# Patient Record
Sex: Male | Born: 1940
Health system: Southern US, Community
[De-identification: ages and names within clinical notes are randomized; demographics above are authoritative.]

## PROBLEM LIST (undated history)

## (undated) ENCOUNTER — Emergency Department (HOSPITAL_COMMUNITY)
Admission: RE | Discharge status: Against Medical Advice | Payer: Medicare Other | Source: Ambulatory Visit | Attending: Family Medicine | Admitting: Family Medicine

## (undated) DIAGNOSIS — R911 Solitary pulmonary nodule: Secondary | ICD-10-CM

## (undated) DIAGNOSIS — N401 Enlarged prostate with lower urinary tract symptoms: Secondary | ICD-10-CM

## (undated) DIAGNOSIS — E7800 Pure hypercholesterolemia, unspecified: Secondary | ICD-10-CM

## (undated) DIAGNOSIS — L910 Hypertrophic scar: Secondary | ICD-10-CM

## (undated) DIAGNOSIS — K219 Gastro-esophageal reflux disease without esophagitis: Secondary | ICD-10-CM

## (undated) DIAGNOSIS — M1990 Unspecified osteoarthritis, unspecified site: Secondary | ICD-10-CM

## (undated) DIAGNOSIS — N138 Other obstructive and reflux uropathy: Secondary | ICD-10-CM

## (undated) DIAGNOSIS — E079 Disorder of thyroid, unspecified: Secondary | ICD-10-CM

## (undated) DIAGNOSIS — I10 Essential (primary) hypertension: Secondary | ICD-10-CM

## (undated) DIAGNOSIS — N200 Calculus of kidney: Secondary | ICD-10-CM

## (undated) DIAGNOSIS — E119 Type 2 diabetes mellitus without complications: Secondary | ICD-10-CM

## (undated) DIAGNOSIS — K648 Other hemorrhoids: Secondary | ICD-10-CM

## (undated) DIAGNOSIS — G5600 Carpal tunnel syndrome, unspecified upper limb: Secondary | ICD-10-CM

## (undated) DIAGNOSIS — I82419 Acute embolism and thrombosis of unspecified femoral vein: Secondary | ICD-10-CM

## (undated) HISTORY — DX: Other obstructive and reflux uropathy: N40.1

## (undated) HISTORY — DX: Carpal tunnel syndrome, unspecified upper limb: G56.00

## (undated) HISTORY — PX: OTHER SURGICAL HISTORY: SHX169

## (undated) HISTORY — DX: Hypertrophic scar: L91.0

## (undated) HISTORY — DX: Gastro-esophageal reflux disease without esophagitis: K21.9

## (undated) HISTORY — DX: Solitary pulmonary nodule: R91.1

## (undated) HISTORY — DX: Unspecified osteoarthritis, unspecified site: M19.90

## (undated) HISTORY — DX: Other hemorrhoids: K64.8

## (undated) HISTORY — DX: Pure hypercholesterolemia, unspecified: E78.00

## (undated) HISTORY — DX: Acute embolism and thrombosis of unspecified femoral vein: I82.419

## (undated) HISTORY — DX: Calculus of kidney: N20.0

## (undated) HISTORY — DX: Benign prostatic hyperplasia with lower urinary tract symptoms: N13.8

## (undated) HISTORY — DX: Essential (primary) hypertension: I10

## (undated) HISTORY — DX: Type 2 diabetes mellitus without complications: E11.9

---

## 1997-03-13 HISTORY — PX: PROSTATECTOMY: SHX69

## 1997-08-11 ENCOUNTER — Inpatient Hospital Stay (HOSPITAL_COMMUNITY): Admission: RE | Admit: 1997-08-11 | Discharge: 1997-08-16 | Payer: Self-pay | Admitting: Urology

## 1997-09-22 ENCOUNTER — Emergency Department (HOSPITAL_COMMUNITY): Admission: EM | Admit: 1997-09-22 | Discharge: 1997-09-22 | Payer: Self-pay | Admitting: Emergency Medicine

## 1998-03-13 HISTORY — PX: COSMETIC SURGERY: SHX468

## 1999-03-30 ENCOUNTER — Encounter: Payer: Self-pay | Admitting: Urology

## 1999-03-30 ENCOUNTER — Encounter: Admission: RE | Admit: 1999-03-30 | Discharge: 1999-03-30 | Payer: Self-pay | Admitting: Urology

## 1999-06-17 ENCOUNTER — Emergency Department (HOSPITAL_COMMUNITY): Admission: EM | Admit: 1999-06-17 | Discharge: 1999-06-17 | Payer: Self-pay | Admitting: Emergency Medicine

## 1999-08-15 ENCOUNTER — Ambulatory Visit (HOSPITAL_COMMUNITY): Admission: RE | Admit: 1999-08-15 | Discharge: 1999-08-15 | Payer: Self-pay | Admitting: Pulmonary Disease

## 1999-08-15 ENCOUNTER — Encounter: Payer: Self-pay | Admitting: Pulmonary Disease

## 2003-11-13 ENCOUNTER — Ambulatory Visit (HOSPITAL_COMMUNITY): Admission: RE | Admit: 2003-11-13 | Discharge: 2003-11-13 | Payer: Self-pay | Admitting: Orthopedic Surgery

## 2003-11-13 ENCOUNTER — Ambulatory Visit (HOSPITAL_BASED_OUTPATIENT_CLINIC_OR_DEPARTMENT_OTHER): Admission: RE | Admit: 2003-11-13 | Discharge: 2003-11-13 | Payer: Self-pay | Admitting: Orthopedic Surgery

## 2004-06-09 ENCOUNTER — Ambulatory Visit: Payer: Self-pay | Admitting: Pulmonary Disease

## 2004-12-12 ENCOUNTER — Ambulatory Visit: Payer: Self-pay | Admitting: Pulmonary Disease

## 2005-05-08 ENCOUNTER — Ambulatory Visit (HOSPITAL_COMMUNITY): Admission: RE | Admit: 2005-05-08 | Discharge: 2005-05-08 | Payer: Self-pay | Admitting: Family Medicine

## 2005-07-10 ENCOUNTER — Ambulatory Visit: Payer: Self-pay | Admitting: Pulmonary Disease

## 2005-07-28 ENCOUNTER — Ambulatory Visit: Payer: Self-pay | Admitting: Pulmonary Disease

## 2005-10-02 ENCOUNTER — Ambulatory Visit: Payer: Self-pay | Admitting: Orthopedic Surgery

## 2005-11-07 ENCOUNTER — Ambulatory Visit: Payer: Self-pay | Admitting: Pulmonary Disease

## 2006-02-19 ENCOUNTER — Ambulatory Visit: Payer: Self-pay | Admitting: Pulmonary Disease

## 2006-05-02 ENCOUNTER — Ambulatory Visit: Payer: Self-pay | Admitting: Pulmonary Disease

## 2006-05-02 LAB — CONVERTED CEMR LAB
ALT: 28 units/L (ref 0–40)
AST: 31 units/L (ref 0–37)
Alkaline Phosphatase: 60 units/L (ref 39–117)
BUN: 16 mg/dL (ref 6–23)
CO2: 32 meq/L (ref 19–32)
GFR calc Af Amer: 66 mL/min
HDL: 37.5 mg/dL — ABNORMAL LOW (ref >39.0)
Hgb A1c MFr Bld: 7.4 % — ABNORMAL HIGH (ref 4.6–6.0)
Sodium: 141 meq/L (ref 135–145)
VLDL: 30 mg/dL (ref 0–40)

## 2006-07-31 ENCOUNTER — Ambulatory Visit: Payer: Self-pay | Admitting: Pulmonary Disease

## 2006-07-31 LAB — CONVERTED CEMR LAB
BUN: 11 mg/dL (ref 6–23)
Calcium: 9.6 mg/dL (ref 8.4–10.5)
Chloride: 104 meq/L (ref 96–112)
Creatinine, Ser: 1.7 mg/dL — ABNORMAL HIGH (ref 0.4–1.5)
GFR calc non Af Amer: 43 mL/min
Potassium: 4.5 meq/L (ref 3.5–5.1)
Sodium: 142 meq/L (ref 135–145)

## 2006-10-19 ENCOUNTER — Ambulatory Visit: Payer: Self-pay | Admitting: Gastroenterology

## 2006-11-02 ENCOUNTER — Ambulatory Visit: Payer: Self-pay | Admitting: Gastroenterology

## 2006-11-26 ENCOUNTER — Ambulatory Visit: Payer: Self-pay | Admitting: Gastroenterology

## 2006-11-26 LAB — CONVERTED CEMR LAB
Fecal Occult Blood: NEGATIVE
OCCULT 4: NEGATIVE

## 2006-11-28 ENCOUNTER — Ambulatory Visit: Payer: Self-pay | Admitting: Pulmonary Disease

## 2006-11-28 LAB — CONVERTED CEMR LAB
AST: 31 units/L (ref 0–37)
Albumin: 4.2 g/dL (ref 3.5–5.2)
Alkaline Phosphatase: 56 units/L (ref 39–117)
Basophils Absolute: 0 10*3/uL (ref 0.0–0.1)
Basophils Relative: 0.5 % (ref 0.0–1.0)
CO2: 33 meq/L — ABNORMAL HIGH (ref 19–32)
Calcium: 9.6 mg/dL (ref 8.4–10.5)
Cholesterol: 148 mg/dL (ref 0–200)
Eosinophils Relative: 3.1 % (ref 0.0–5.0)
HDL: 29.6 mg/dL — ABNORMAL LOW (ref >39.0)
Hemoglobin: 14.8 g/dL (ref 13.0–17.0)
Hgb A1c MFr Bld: 6.5 % — ABNORMAL HIGH (ref 4.6–6.0)
LDL Cholesterol: 78 mg/dL (ref 0–99)
PSA: 1.01 ng/mL (ref 0.10–4.00)
Platelets: 203 10*3/uL (ref 150–400)
Potassium: 4.4 meq/L (ref 3.5–5.1)
Total Bilirubin: 0.9 mg/dL (ref 0.3–1.2)
Total Protein: 7.7 g/dL (ref 6.0–8.3)
Triglycerides: 200 mg/dL — ABNORMAL HIGH (ref 0–149)

## 2007-01-20 ENCOUNTER — Emergency Department (HOSPITAL_COMMUNITY): Admission: EM | Admit: 2007-01-20 | Discharge: 2007-01-20 | Payer: Self-pay | Admitting: Emergency Medicine

## 2007-01-21 ENCOUNTER — Ambulatory Visit: Payer: Self-pay | Admitting: Pulmonary Disease

## 2007-01-24 DIAGNOSIS — N200 Calculus of kidney: Secondary | ICD-10-CM

## 2007-01-24 DIAGNOSIS — M1990 Unspecified osteoarthritis, unspecified site: Secondary | ICD-10-CM

## 2007-01-24 DIAGNOSIS — G5600 Carpal tunnel syndrome, unspecified upper limb: Secondary | ICD-10-CM

## 2007-01-24 DIAGNOSIS — E782 Mixed hyperlipidemia: Secondary | ICD-10-CM

## 2007-01-24 DIAGNOSIS — I10 Essential (primary) hypertension: Secondary | ICD-10-CM | POA: Insufficient documentation

## 2007-01-28 ENCOUNTER — Encounter: Payer: Self-pay | Admitting: Pulmonary Disease

## 2007-02-19 ENCOUNTER — Ambulatory Visit: Payer: Self-pay | Admitting: Pulmonary Disease

## 2007-02-22 ENCOUNTER — Ambulatory Visit (HOSPITAL_BASED_OUTPATIENT_CLINIC_OR_DEPARTMENT_OTHER): Admission: RE | Admit: 2007-02-22 | Discharge: 2007-02-22 | Payer: Self-pay | Admitting: Orthopedic Surgery

## 2007-02-26 ENCOUNTER — Ambulatory Visit (HOSPITAL_COMMUNITY): Admission: RE | Admit: 2007-02-26 | Discharge: 2007-02-26 | Payer: Self-pay | Admitting: Family Medicine

## 2007-03-02 ENCOUNTER — Ambulatory Visit (HOSPITAL_COMMUNITY): Admission: RE | Admit: 2007-03-02 | Discharge: 2007-03-02 | Payer: Self-pay | Admitting: Family Medicine

## 2007-03-15 ENCOUNTER — Telehealth: Payer: Self-pay | Admitting: Pulmonary Disease

## 2007-03-27 ENCOUNTER — Ambulatory Visit: Payer: Self-pay | Admitting: Pulmonary Disease

## 2007-03-27 DIAGNOSIS — N401 Enlarged prostate with lower urinary tract symptoms: Secondary | ICD-10-CM

## 2007-03-27 DIAGNOSIS — R911 Solitary pulmonary nodule: Secondary | ICD-10-CM

## 2007-03-27 DIAGNOSIS — N138 Other obstructive and reflux uropathy: Secondary | ICD-10-CM

## 2007-03-27 DIAGNOSIS — L910 Hypertrophic scar: Secondary | ICD-10-CM

## 2007-03-27 DIAGNOSIS — K219 Gastro-esophageal reflux disease without esophagitis: Secondary | ICD-10-CM | POA: Insufficient documentation

## 2007-04-16 ENCOUNTER — Encounter: Payer: Self-pay | Admitting: Pulmonary Disease

## 2007-04-18 ENCOUNTER — Telehealth (INDEPENDENT_AMBULATORY_CARE_PROVIDER_SITE_OTHER): Payer: Self-pay | Admitting: *Deleted

## 2007-05-29 ENCOUNTER — Ambulatory Visit: Payer: Self-pay | Admitting: Pulmonary Disease

## 2007-05-29 DIAGNOSIS — K649 Unspecified hemorrhoids: Secondary | ICD-10-CM | POA: Insufficient documentation

## 2007-06-02 LAB — CONVERTED CEMR LAB
Alkaline Phosphatase: 66 units/L (ref 39–117)
BUN: 12 mg/dL (ref 6–23)
Bacteria, UA: NEGATIVE
Basophils Relative: 0.3 % (ref 0.0–1.0)
Bilirubin Urine: NEGATIVE
Creatinine, Ser: 1.2 mg/dL (ref 0.4–1.5)
Crystals: NEGATIVE
Direct LDL: 114.3 mg/dL
Eosinophils Absolute: 0.1 10*3/uL (ref 0.0–0.6)
HDL: 43.8 mg/dL (ref >39.0)
Hgb A1c MFr Bld: 7 % — ABNORMAL HIGH (ref 4.6–6.0)
Lymphocytes Relative: 19 % (ref 12.0–46.0)
MCV: 91.2 fL (ref 78.0–100.0)
Monocytes Absolute: 0.6 10*3/uL (ref 0.2–0.7)
Neutro Abs: 5.5 10*3/uL (ref 1.4–7.7)
Nitrite: NEGATIVE
Potassium: 3.8 meq/L (ref 3.5–5.1)
RBC / HPF: NONE SEEN
RDW: 13.8 % (ref 11.5–14.6)
Specific Gravity, Urine: 1.02 (ref 1.000–1.03)
TSH: 1.35 microintl units/mL (ref 0.35–5.50)
Total CHOL/HDL Ratio: 4.9
Triglycerides: 236 mg/dL (ref 0–149)
Uric Acid, Serum: 6.3 mg/dL (ref 2.4–7.0)
VLDL: 47 mg/dL — ABNORMAL HIGH (ref 0–40)
WBC, UA: NONE SEEN cells/hpf

## 2007-06-05 ENCOUNTER — Ambulatory Visit: Payer: Self-pay | Admitting: Pulmonary Disease

## 2007-07-08 ENCOUNTER — Encounter: Payer: Self-pay | Admitting: Pulmonary Disease

## 2007-09-26 ENCOUNTER — Ambulatory Visit: Payer: Self-pay | Admitting: Pulmonary Disease

## 2007-09-28 LAB — CONVERTED CEMR LAB
ALT: 37 units/L (ref 0–53)
Alkaline Phosphatase: 68 units/L (ref 39–117)
Basophils Absolute: 0 10*3/uL (ref 0.0–0.1)
Cholesterol: 129 mg/dL (ref 0–200)
Creatinine, Ser: 1.2 mg/dL (ref 0.4–1.5)
Eosinophils Absolute: 0.2 10*3/uL (ref 0.0–0.7)
GFR calc non Af Amer: 64 mL/min
Glucose, Bld: 104 mg/dL — ABNORMAL HIGH (ref 70–99)
HCT: 46.2 % (ref 39.0–52.0)
HDL: 34.6 mg/dL — ABNORMAL LOW (ref >39.0)
Hgb A1c MFr Bld: 6.8 % — ABNORMAL HIGH (ref 4.6–6.0)
LDL Cholesterol: 57 mg/dL (ref 0–99)
MCHC: 33.9 g/dL (ref 30.0–36.0)
Monocytes Relative: 6.3 % (ref 3.0–12.0)
Neutrophils Relative %: 66.5 % (ref 43.0–77.0)
Potassium: 4.3 meq/L (ref 3.5–5.1)
RDW: 12.7 % (ref 11.5–14.6)
Sodium: 140 meq/L (ref 135–145)
Triglycerides: 188 mg/dL — ABNORMAL HIGH (ref 0–149)
Uric Acid, Serum: 6.1 mg/dL (ref 4.0–7.8)
VLDL: 38 mg/dL (ref 0–40)
WBC: 6.8 10*3/uL (ref 4.5–10.5)

## 2007-10-06 ENCOUNTER — Emergency Department (HOSPITAL_COMMUNITY): Admission: EM | Admit: 2007-10-06 | Discharge: 2007-10-07 | Payer: Self-pay | Admitting: Emergency Medicine

## 2007-10-15 ENCOUNTER — Ambulatory Visit: Payer: Self-pay | Admitting: Cardiology

## 2007-10-28 ENCOUNTER — Ambulatory Visit (HOSPITAL_COMMUNITY): Admission: RE | Admit: 2007-10-28 | Discharge: 2007-10-28 | Payer: Self-pay | Admitting: Cardiology

## 2007-10-28 ENCOUNTER — Ambulatory Visit: Payer: Self-pay | Admitting: Cardiology

## 2007-12-26 ENCOUNTER — Ambulatory Visit (HOSPITAL_COMMUNITY): Admission: RE | Admit: 2007-12-26 | Discharge: 2007-12-26 | Payer: Self-pay | Admitting: Internal Medicine

## 2008-01-31 ENCOUNTER — Telehealth (INDEPENDENT_AMBULATORY_CARE_PROVIDER_SITE_OTHER): Payer: Self-pay | Admitting: *Deleted

## 2008-03-25 ENCOUNTER — Ambulatory Visit: Payer: Self-pay | Admitting: Pulmonary Disease

## 2008-04-03 LAB — CONVERTED CEMR LAB
AST: 39 units/L — ABNORMAL HIGH (ref 0–37)
Albumin: 4.4 g/dL (ref 3.5–5.2)
Alkaline Phosphatase: 66 units/L (ref 39–117)
BUN: 12 mg/dL (ref 6–23)
Bilirubin, Direct: 0.1 mg/dL (ref 0.0–0.3)
CO2: 31 meq/L (ref 19–32)
Chloride: 103 meq/L (ref 96–112)
Eosinophils Absolute: 0.1 10*3/uL (ref 0.0–0.7)
GFR calc non Af Amer: 64 mL/min
Glucose, Bld: 125 mg/dL — ABNORMAL HIGH (ref 70–99)
HCT: 45.9 % (ref 39.0–52.0)
Hgb A1c MFr Bld: 7 % — ABNORMAL HIGH (ref 4.6–6.0)
MCV: 92 fL (ref 78.0–100.0)
Monocytes Absolute: 0.5 10*3/uL (ref 0.1–1.0)
Potassium: 4.2 meq/L (ref 3.5–5.1)
Sodium: 142 meq/L (ref 135–145)
Total Bilirubin: 1 mg/dL (ref 0.3–1.2)
Total Protein: 7.6 g/dL (ref 6.0–8.3)
Uric Acid, Serum: 6.5 mg/dL (ref 4.0–7.8)
VLDL: 33 mg/dL (ref 0–40)
Vit D, 1,25-Dihydroxy: 22 — ABNORMAL LOW (ref 30–89)

## 2008-04-06 ENCOUNTER — Ambulatory Visit (HOSPITAL_COMMUNITY): Admission: RE | Admit: 2008-04-06 | Discharge: 2008-04-06 | Payer: Self-pay | Admitting: Family Medicine

## 2008-04-22 ENCOUNTER — Ambulatory Visit (HOSPITAL_COMMUNITY): Admission: RE | Admit: 2008-04-22 | Discharge: 2008-04-22 | Payer: Self-pay | Admitting: Urology

## 2008-06-09 ENCOUNTER — Encounter: Payer: Self-pay | Admitting: Pulmonary Disease

## 2008-12-02 ENCOUNTER — Ambulatory Visit: Payer: Self-pay | Admitting: Pulmonary Disease

## 2008-12-05 LAB — CONVERTED CEMR LAB
AST: 33 units/L (ref 0–37)
Albumin: 4.6 g/dL (ref 3.5–5.2)
Alkaline Phosphatase: 64 units/L (ref 39–117)
Creatinine, Ser: 1.3 mg/dL (ref 0.4–1.5)
Glucose, Bld: 102 mg/dL — ABNORMAL HIGH (ref 70–99)
HCT: 46.8 % (ref 39.0–52.0)
LDL Cholesterol: 77 mg/dL (ref 0–99)
Lymphs Abs: 1.8 10*3/uL (ref 0.7–4.0)
MCV: 92.2 fL (ref 78.0–100.0)
Monocytes Relative: 5.4 % (ref 3.0–12.0)
Platelets: 155 10*3/uL (ref 150.0–400.0)
TSH: 1.75 microintl units/mL (ref 0.35–5.50)
Triglycerides: 138 mg/dL (ref 0.0–149.0)
VLDL: 27.6 mg/dL (ref 0.0–40.0)
WBC: 8 10*3/uL (ref 4.5–10.5)

## 2009-01-21 ENCOUNTER — Encounter: Payer: Self-pay | Admitting: Pulmonary Disease

## 2009-05-31 ENCOUNTER — Ambulatory Visit: Payer: Self-pay | Admitting: Pulmonary Disease

## 2009-05-31 LAB — CONVERTED CEMR LAB
BUN: 10 mg/dL (ref 6–23)
CO2: 32 meq/L (ref 19–32)
Calcium: 9.5 mg/dL (ref 8.4–10.5)
Cholesterol: 127 mg/dL (ref 0–200)
GFR calc non Af Amer: 77.41 mL/min (ref >60)
Glucose, Bld: 110 mg/dL — ABNORMAL HIGH (ref 70–99)
Hgb A1c MFr Bld: 7 % — ABNORMAL HIGH (ref 4.6–6.5)
LDL Cholesterol: 64 mg/dL (ref 0–99)

## 2009-11-30 ENCOUNTER — Ambulatory Visit: Payer: Self-pay | Admitting: Pulmonary Disease

## 2009-12-01 ENCOUNTER — Ambulatory Visit: Payer: Self-pay | Admitting: Pulmonary Disease

## 2009-12-10 LAB — CONVERTED CEMR LAB
ALT: 34 units/L (ref 0–53)
Albumin: 4.1 g/dL (ref 3.5–5.2)
BUN: 11 mg/dL (ref 6–23)
Basophils Relative: 0.5 % (ref 0.0–3.0)
Chloride: 102 meq/L (ref 96–112)
Eosinophils Absolute: 0.1 10*3/uL (ref 0.0–0.7)
Eosinophils Relative: 2 % (ref 0.0–5.0)
HDL: 35.7 mg/dL — ABNORMAL LOW (ref >39.00)
Hgb A1c MFr Bld: 7.7 % — ABNORMAL HIGH (ref 4.6–6.5)
LDL Cholesterol: 77 mg/dL (ref 0–99)
Neutro Abs: 4.9 10*3/uL (ref 1.4–7.7)
Neutrophils Relative %: 72.6 % (ref 43.0–77.0)
PSA: 1.13 ng/mL (ref 0.10–4.00)
Platelets: 166 10*3/uL (ref 150.0–400.0)
RBC: 4.73 M/uL (ref 4.22–5.81)
RDW: 13.3 % (ref 11.5–14.6)
TSH: 1.3 microintl units/mL (ref 0.35–5.50)
Total Bilirubin: 0.6 mg/dL (ref 0.3–1.2)
Total CHOL/HDL Ratio: 4
Triglycerides: 151 mg/dL — ABNORMAL HIGH (ref 0.0–149.0)
VLDL: 30.2 mg/dL (ref 0.0–40.0)

## 2010-01-28 ENCOUNTER — Encounter: Payer: Self-pay | Admitting: Pulmonary Disease

## 2010-02-11 ENCOUNTER — Telehealth (INDEPENDENT_AMBULATORY_CARE_PROVIDER_SITE_OTHER): Payer: Self-pay | Admitting: *Deleted

## 2010-04-12 NOTE — Assessment & Plan Note (Signed)
Summary: physical ///kp   CC:  6 month ROV & review of mult medical problems....  History of Present Illness: 70 y/o BM here for a follow up visit... he has mult med prob as noted below...   ~  Jul09:  went to ER [facility]) w/ CP- felt to be atypical CP and referred to [facility] w/ eval 8/09 (note reviewed)... Myoview done 11/01/07 for this & risk factors- it showed no evid for infarct, +diaph attenuation, EF sl reduced at 47%...   ~  Jan10:  he reports that he has been doing well- trying to diet, and [facility]... unfortunately his weight is stable at 238# (hasn't been able to lose weight)... no new complaints or concerns today...  ~  Sep10:  [facility] continues to attempt [facility]- weight= 232#... he tells me he had some right groin pain & went to see DrRDavis for eval- he did MRI which the pt says was OK (we don't have notes from [facility])... he still takes Coumadin w/ Protimes by DrGolding in [place]... he went to [place] w/ bug bite on leg- received Tetanus shot & Flu shot...   ~  May 31, 2009:  generally stable- only c/o some right flank area muscle spasm w/ sudden twist or turn> we discussed heat, poss muscle relaxer, further eval if symptom persists... feeling well- BP controlled on meds;  Chol OK on Simva40;  DM stable on simple med, but still hasn't been able to lose weight... we discussed diet + exercise needs to lose weight...   ~  November 30, 2009:  he states he had a gout flair- occurred after stopping Allopurinol (?why) & placed on Probenecid by DrGolding- he wants to restart the Allopurinol gout preventive & I agree...  as before weight= the same & can't seem to lose any despite diet efforts> BP controlled on meds;  needs f/u fasting blood work to check Chol & A1c- he reports BS  ~110 range;  OK Flu shot today.   Current Problems:  PULMONARY NODULE (ICD-518.89) - vague nodular opacity right base over diaphragm on old films...  ~  1.7 cm  benign granuloma... no change on serial CXR or CT's back to 2000.  ~  CXR 2/10 is WNL- nodule not seen...  ~  CXR 9/10 showed tort Ao, Cor WNL, lungs clear w/o nodule identified.  ~  CXR 9/11 showed clear lungs, NAD...  HYPERTENSION (ICD-401.9) - now controlled on TOPROL 50mg Bid and NORVASC 10mg /d... BP's at home in the 120-130/70 range, and measures 126/82 today- he denies HA, fatigue, visual changes, CP, palipit, dizziness, syncope, dyspnea, edema, etc.  ~  he had a neg Cardiolite study in 10/04- no ischemia and EF=52%.  ~  Aug09: eval DrWall for atyp CP & risk factors--- Myoview 11/01/07 showed no evid for infarct, +diaph attenuation, EF sl reduced at 47%.  DEEP VENOUS THROMBOPHLEBITIS (ICD-453.40) - Dx'd by LMD in [place] in 3/07... [facility] was [facility] and had pain/ swelling L calf... ultrasound at [place] + for DVT & Rx COUMADIN by DrGolding (protimes q month)- no recent problems, he will check w/ DrGolding about discontinuing the Coumadin.  HYPERCHOLESTEROLEMIA (ICD-272.0) - controlled on SIMVASTATIN 40mg /d & FISH OIL...   ~  [place] 9/08 showed TChol 148, TG 200, HDL 30, LDL 78... on Advicor 500-20 at that time- keep same.  ~  [place] 3/09 showed TChol 214, TG 236, HDL 44, LDL 114... on  Advicor- rec change to Simvastatin40.  ~  [place] 7/09 showed TChol 129, Tg 188, HDL 35, LDL 57... rec- keep same.  ~  FLP 1/10 showed TChol 142, TG 165, HDL 35, LDL 74  ~  FLP 9/10 showed TChol 146, TG 138, HDL 42, LDL 77  ~  FLP 3/11 showed TChol 127, TG 147, HDL 34, LDL 64  ~  FLP 9/11 showed TChol 143, TG 151, HDL 36, LDL 77  DM (ICD-250.00) - on GLUCOVANCE 2.5-500 1/2tab @dinner  only now (due to low sugars on larger dosage).  ~  labs 9/08 showed FBS=106 and HgA1C=6.5.Marland KitchenMarland Kitchen on Glucovance 1/2 Bid...  ~  labs 3/09 showed BS= 99, HgA1c= 7.0.Marland KitchenMarland Kitchen on Glucovance 1/2 Qam only- rec to incr to Bid again.  ~  labs 7/09 showed BS= 104, HgA1c= 6.8.Marland KitchenMarland Kitchen rec- keep same.  ~  ** pt reports sugars too low   ~50 at 11AM if he takes even 1/2 tab in AM, therefore changed to 1/2 at dinner only...  ~  labs 1/10 showed BS= 125, A1c= 7.0  ~  labs 9/10 showed BS= 102, A1c= 6.8  ~  labs 3/11 showed BS= 110, A1c= 7.0.Marland KitchenMarland Kitchen continue same meds, get wt down!  ~  labs 9/11 showed BS= 129, A1c= 7.7.Marland KitchenMarland Kitchen rec change to METFORMIN ER 500mg  Qam & GLIMEPIRIDE 1mg  Qam.  GERD (ICD-530.81) - takes OMEPRAZOLE 20mg  Prn for reflux symptoms...  ~  neg colonoscopy by DrStark in 6/00, and again 8/08 ( x sm hem's)... f/u planned 10 yrs.  BENIGN PROSTATIC HYPERTROPHY, WITH OBSTRUCTION (ICD-600.01) - he sees DrDavis yearly- pt reports "PSA was good" (we don't have notes from him)... S/P open prostatectomy 1999 by [id number]... Hx prostatitis in past... uses Cialis for ED.  ~  labs 3/11 showed BUN= 10, Crear= 1.2... PSA done by Urology.  ~  labs 9/11 showed BUN= 11, Creat= 1.3, PSA= 1.13  RENAL CALCULUS (ICD-592.0)  DEGENERATIVE JOINT DISEASE (ICD-715.90) - hx of pain on his right side and right elbow... prev Rx w/ Etodolac, Parafon, heat and elbow pad... refer to ortho if symptoms persist... now he uses OTC anti-inflamm Rx Prn.  GOUT (ICD-274.9) - states he had episode right elbow arthritis and gout Rx'd by DrSypher 4/09 w/ Colchicine & Allopurinol 100mg /d...  ~  labs 7/09 show Uric 6.1.Marland KitchenMarland Kitchen OK on Allopurinol 100mg /d...  ~  labs 1/10 showed Uric= 6.5  ~  9/11:  pt reports that he stopped Allopurinol earlier this yr & had recent gout attack per DrGolding Rx w/ Probenecid... he would like to restart the Allopurinol preventive Rx & I agree> ALLOPURINOL 300mg /d written.  CARPAL TUNNEL SYNDROME (ICD-354.0) - treated by DrSypher over the years for CTS and mult trigger fingers (w/ surg)...  KELOID (ICD-701.4)  Health Maintenance - pt had "bug bite" on leg 9/10 w/ Tetanus shot by DrGolding in [place]... prev PNEUMOVAX 2000 at age 55, therefore given f/u PNEUMOVAX in 2000 at age 38... he gets yearly flu vaccine each fall.   Preventive  Screening-Counseling & Management  Alcohol-Tobacco     Smoking Status: never  Allergies: 1)  ! Ace Inhibitors  Comments:  Nurse/Medical Assistant: The patient's medications and allergies were reviewed with the patient and were updated in the Medication and Allergy Lists.  Past History:  Past Medical History: PULMONARY NODULE (ICD-518.89) HYPERTENSION (ICD-401.9) DEEP VENOUS THROMBOPHLEBITIS (ICD-453.40) HYPERCHOLESTEROLEMIA (ICD-272.0) DM (ICD-250.00) GERD (ICD-530.81) HEMORRHOIDS (ICD-455.6) BENIGN PROSTATIC HYPERTROPHY, WITH OBSTRUCTION (ICD-600.01) RENAL CALCULUS (ICD-592.0) DEGENERATIVE JOINT DISEASE (ICD-715.90) GOUT (ICD-274.9) CARPAL TUNNEL SYNDROME (ICD-354.0) KELOID (  ICD-701.4)  Past Surgical History: S/P prostatectomy by [id number] 1999 S/P plastic surgery in 2000 S/P ortho surg on fingers by DrSypher 2005 & 2008  Family History: Reviewed history from 12/02/2008 and no changes required. Father died age 34 from cancer of the mouth Mother alive, age 70, w/ DM, arthritis 4 Sibling: 1 Bro- in good health 3 Sis- one died w/ Parkinson's dis, one w/ HBP  Social History: Reviewed history from 12/02/2008 and no changes required. Married, 38yrs... 2 Children from prev marriage: 1 son murdered 69 1 daughter in [place] w/ [place] Never smoked Social alcohol Retired from [employer]       The patient complains of dyspnea on exertion, joint pain, stiffness, and arthritis.  The patient denies fever, chills, sweats, anorexia, fatigue, weakness, malaise, weight loss, sleep disorder, blurring, diplopia, eye irritation, eye discharge, vision loss, eye pain, photophobia, earache, ear discharge, tinnitus, decreased hearing, nasal congestion, nosebleeds, sore throat, hoarseness, chest pain, palpitations, syncope, orthopnea, PND, peripheral edema, cough, dyspnea at rest, excessive sputum, hemoptysis, wheezing, pleurisy, nausea, vomiting, diarrhea, constipation,  change in bowel habits, abdominal pain, melena, hematochezia, jaundice, gas/bloating, indigestion/heartburn, dysphagia, odynophagia, dysuria, hematuria, urinary frequency, urinary hesitancy, nocturia, incontinence, back pain, joint swelling, muscle cramps, muscle weakness, sciatica, restless legs, leg pain at night, leg pain with exertion, rash, itching, dryness, suspicious lesions, paralysis, paresthesias, seizures, tremors, vertigo, transient blindness, frequent falls, frequent headaches, difficulty walking, depression, anxiety, memory loss, confusion, cold intolerance, heat intolerance, polydipsia, polyphagia, polyuria, unusual weight change, abnormal bruising, bleeding, enlarged lymph nodes, urticaria, allergic rash, hay fever, and recurrent infections.    Vital Signs:  Patient profile:   70 year old male Height:      73 inches Weight:      233.50 pounds O2 Sat:      99 % on Room air Temp:     97.6 degrees F oral Pulse rate:   66 / minute BP sitting:   126 / 82  (left arm) Cuff size:   regular  Vitals Entered By: Elita Boone CMA (November 30, 2009 9:32 AM)  O2 Sat at Rest %:  99 O2 Flow:  Room air CC: 6 month ROV & review of mult medical problems... Is Patient Diabetic? Yes Pain Assessment Patient in pain? no      Comments meds updated today with pt   Physical Exam  Additional Exam:  WD, WN, overwt 69 BM in NAD...  GENERAL:  Alert & oriented; pleasant & cooperative... HEENT:  /AT, EOM-wnl, PERRLA, EACs-wax, TMs-wnl, NOSE-clear, THROAT-clear & wnl, no angioedema. NECK:  Supple w/ full ROM; no JVD; normal carotid impulses w/o bruits; no thyromegaly or nodules palpated; no lymphadenopathy. CHEST:  Clear to P & A; without wheezes/ rales/ or rhonchi. HEART:  Regular Rhythm; without murmurs/ rubs/ or gallops. ABDOMEN:  Soft & nontender; normal bowel sounds; no organomegaly or masses detected. RECTAL:  prostate 3+, no nodules, stool heme neg... EXT:  without deformities, mild  arthritic changes; no varicose veins/ venous insuffic/ or edema. NEURO:  CN's intact; motor testing normal; sensory testing normal; gait normal & balance OK. DERM:  Keloids noted...    CXR  Procedure date:  11/30/2009  Findings:      CHEST - 2 VIEW Comparison: 12/02/2008   Findings: Lungs clear.  Heart size and pulmonary vascularity normal.  No effusion.  Visualized bones unremarkable.   IMPRESSION: No acute disease   Read By:  Lucrezia Europe. Quillian Quince,  M.D.    MISC. Report  Procedure  date:  11/30/2009  Findings:      Lipid Panel (LIPID)   Cholesterol               143 mg/dL                   0-200   Triglycerides        [H]  151.0 mg/dL                 0.0-149.0   HDL                  [L]  35.70 mg/dL                 >39.00   LDL Cholesterol           77 mg/dL                    0-99   BMP (METABOL)   Sodium                    140 mEq/L                   135-145   Potassium                 4.3 mEq/L                   3.5-5.1   Chloride                  102 mEq/L                   96-112   Carbon Dioxide            30 mEq/L                    19-32   Glucose              [H]  129 mg/dL                   70-99   BUN                       11 mg/dL                    6-23   Creatinine                1.3 mg/dL                   0.4-1.5   Calcium                   9.5 mg/dL                   8.4-10.5   GFR                       73.07 mL/min                >60  Hepatic/Liver Function Panel (HEPATIC)   Total Bilirubin           0.6 mg/dL                   0.3-1.2   Direct Bilirubin          0.1 mg/dL  0.0-0.3   Alkaline Phosphatase      59 U/L                      39-117   AST                       37 U/L                      0-37   ALT                       34 U/L                      0-53   Total Protein             7.1 g/dL                    6.0-8.3   Albumin                   4.1 g/dL                    3.5-5.2  Comments:      CBC Platelet w/Diff  (CBCD)   White Cell Count          6.8 K/uL                    4.5-10.5   Red Cell Count            4.73 Mil/uL                 4.22-5.81   Hemoglobin                15.2 g/dL                   13.0-17.0   Hematocrit                43.6 %                      39.0-52.0   MCV                       92.2 fl                     78.0-100.0   Platelet Count            166.0 K/uL                  150.0-400.0   Neutrophil %              72.6 %                      43.0-77.0   Lymphocyte %              18.3 %                      12.0-46.0   Monocyte %                6.6 %                       3.0-12.0   Eosinophils%  2.0 %                       0.0-5.0   Basophils %               0.5 %                       0.0-3.0  TSH (TSH)   FastTSH                   1.30 uIU/mL                 0.35-5.50  Hemoglobin A1C (A1C)   Hemoglobin A1C       [H]  7.7 %                       4.6-6.5  Prostate Specific Antigen (PSA)   PSA-Hyb                   1.13 ng/mL                  0.10-4.00   Impression & Recommendations:  Problem # 1:  PULMONARY NODULE (ICD-518.89) F/u CXR is neg- prob granuloma, no signif issue here... Orders: T-2 View CXR ([id number])  Problem # 2:  HYPERTENSION (ICD-401.9) Controlled on BBlocker & CCB... continue same meds. His updated medication list for this problem includes:    Metoprolol Tartrate 50 Mg Tabs (Metoprolol tartrate) .Marland Kitchen... Take 1 tablet by mouth two times a day    Amlodipine Besylate 10 Mg Tabs (Amlodipine besylate) .Marland Kitchen... 1 tab daily...  Problem # 3:  DEEP VENOUS THROMBOPHLEBITIS (ICD-453.40) Still on Coumadin per DrGolding in [place]...  Problem # 4:  HYPERCHOLESTEROLEMIA (ICD-272.0) Stable on the Statin Rx>  tol well w/o muscle or liver problems... His updated medication list for this problem includes:    Simvastatin 40 Mg Tabs (Simvastatin) .Marland Kitchen... Take one by mouth at bedtime  Problem # 5:  DM (ICD-250.00) His A1c is worse- up to 7.7 & I suggest  that we change his meds>  change Glucovance to METFORMIN ER 500mg  Qam + GLIMEPIRIDE 1mg /d. The following medications were removed from the medication list:    Glyburide-metformin 2.5-500 Mg Tabs (Glyburide-metformin) .Marland Kitchen... Take 1/2 tab by mouth at dinnertime daily... His updated medication list for this problem includes:    Metformin Hcl 500 Mg Xr24h-tab (Metformin hcl) .Marland Kitchen... Take 1 tab by mouth once daily...    Glimepiride 1 Mg Tabs (Glimepiride) .Marland Kitchen... Take 1 tab by mouth once daily in the am...  Problem # 6:  GERD (ICD-530.81) Stable on PPI therapy>  continue same. His updated medication list for this problem includes:    Omeprazole 20 Mg Cpdr (Omeprazole) .Marland Kitchen... Take 1 tab by mouth once daily as needed for stomach acid...  Problem # 7:  BENIGN PROSTATIC HYPERTROPHY, WITH OBSTRUCTION (ICD-600.01) He will be assigned a new Urologist at [facility] since [place] left for [place].  Problem # 8:  GOUT (ICD-274.9) We discussed restarting Allopurinol Rx... His updated medication list for this problem includes:    Allopurinol 300 Mg Tabs (Allopurinol) .Marland Kitchen... Take 1 tab by mouth once daily...    Probenecid 500 Mg Tabs (Probenecid) .Marland Kitchen... As directed by drgolding...  Problem # 9:  OTHER MEDICAL PROBLEMS AS NOTED>>> OK Flu shot for 2011...  Complete Medication List: 1)  Warfarin Sodium 7.5 Mg Tabs (Warfarin sodium) .... Take as directed by drgolding.Marland KitchenMarland Kitchen 2)  Metoprolol Tartrate 50 Mg Tabs (Metoprolol tartrate) .... Take 1 tablet by mouth two times a day 3)  Amlodipine Besylate 10 Mg Tabs (Amlodipine besylate) .Marland Kitchen.. 1 tab daily.Marland KitchenMarland Kitchen 4)  Simvastatin 40 Mg Tabs (Simvastatin) .... Take one by mouth at bedtime 5)  Fish Oil 1000 Mg Caps (Omega-3 fatty acids) .... Take one capsule every day 6)  Omeprazole 20 Mg Cpdr (Omeprazole) .... Take 1 tab by mouth once daily as needed for stomach acid.Marland KitchenMarland Kitchen 7)  Allopurinol 300 Mg Tabs (Allopurinol) .... Take 1 tab by mouth once daily.Marland KitchenMarland Kitchen 8)  Probenecid 500 Mg Tabs  (Probenecid) .... As directed by drgolding... 9)  Cialis 20 Mg Tabs (Tadalafil) .... Take as directed for e.d.Marland KitchenMarland KitchenMarland Kitchen 10)  One-a-day Mens Tabs (Multiple vitamin) .... Take 1 tablet by mouth once a day 11)  Accu-chek Softclix Lancets Misc (Lancets) .... Use as directed 12)  Accu-chek Comfort Curve Strp (Glucose blood) .... Use as directed 13)  Metformin Hcl 500 Mg Xr24h-tab (Metformin hcl) .... Take 1 tab by mouth once daily... 14)  Glimepiride 1 Mg Tabs (Glimepiride) .... Take 1 tab by mouth once daily in the am...  Other Orders: Flu Vaccine 49yrs + MEDICARE PATIENTS [id number]) Administration Flu vaccine - MCR [id number])  Patient Instructions: 1)  Today we updated your med list- see below.... 2)  We decided to change your Gout med to ALLOPURINOL 300mg  daily as a preventive... 3)  Today we did your follow up CXR.Marland KitchenMarland Kitchen 4)  Please return to our lab in the AM for your FASTING blood work... 5)  Then please call the "phone tree" in a few days for your lab results.Marland KitchenMarland Kitchen 6)  We gave you the 2011 Flu vaccine today... 7)  Let's get on track w/ our diet + exercise program!!! 8)  Call for any questions.Marland KitchenMarland Kitchen 9)  Please schedule a follow-up appointment in 6 months. Prescriptions: GLIMEPIRIDE 1 MG TABS (GLIMEPIRIDE) take 1 tab by mouth once daily in the am...  #30 x 12   Entered and Authorized by:   Noralee Space MD   Signed by:   Noralee Space MD on 12/04/2009   Method used:   Print then Give to Patient   RxID:   [id number] METFORMIN HCL 500 MG XR24H-TAB (METFORMIN HCL) take 1 tab by mouth once daily...  #30 x 12   Entered and Authorized by:   Noralee Space MD   Signed by:   Noralee Space MD on 12/04/2009   Method used:   Print then Give to Patient   RxID:   [id number] ALLOPURINOL 300 MG TABS (ALLOPURINOL) take 1 tab by mouth once daily...  #30 x 12   Entered and Authorized by:   Noralee Space MD   Signed by:   Noralee Space MD on 11/30/2009   Method used:   Print then Give to Patient   RxID:    [id number] OMEPRAZOLE 20 MG CPDR (OMEPRAZOLE) take 1 tab by mouth once daily as needed for stomach acid...  #30 x 12   Entered and Authorized by:   Noralee Space MD   Signed by:   Noralee Space MD on 11/30/2009   Method used:   Print then Give to Patient   RxID:   [id number]    Immunization History:  Influenza Immunization History:    Influenza:  historical (01/27/2009) Flu Vaccine Consent Questions     Do you have a history of severe allergic reactions to this vaccine? no    Any prior  history of allergic reactions to egg and/or gelatin? no    Do you have a sensitivity to the preservative Thimersol? no    Do you have a past history of Guillan-Barre Syndrome? no    Do you currently have an acute febrile illness? no    Have you ever had a severe reaction to latex? no    Vaccine information given and explained to patient? yes    Are you currently pregnant? no    Lot Number:[id number]   Exp Date:09/10/2010   Site Given  Left Deltoid IMflu   Elita Boone CMA  November 30, 2009 10:37 AM

## 2010-04-12 NOTE — Assessment & Plan Note (Signed)
Summary: 6 months/apc   CC:  6 month ROV & review of mult medical problems....  History of Present Illness: 70 y/o BM here for a follow up visit... he has mult med prob as noted below...   ~  Jul09:  went to ER [facility]) w/ CP- felt to be atypical CP and referred to [facility] w/ eval 8/09 (note reviewed)... Myoview done 11/01/07 for this & risk factors- it showed no evid for infarct, +diaph attenuation, EF sl reduced at 47%...   ~  Jan10:  he reports that he has been doing well- trying to diet, and [facility]... unfortunately his weight is stable at 238# (hasn't been able to lose weight)... no new complaints or concerns today...  ~  Sep10:  he continues to attempt diet [facility]- weight= 232#... he tells me he had some right groin pain & went to see DrRDavis for eval- he did MRI which the pt says was OK (we don't have notes from [facility])... he still takes Coumadin w/ Protimes by DrGolding in [place]... he went to [place] w/ bug bite on leg- received Tetanus shot & Flu shot...   ~  May 31, 2009:  generally stable- only c/o some right flank area muscle spasm w/ sudden twist or turn> we discussed heat, poss muscle relaxer, further eval if symptom persists... feeling well- BP controlled on meds;  Chol OK on Simva40;  DM stable on simple med, but still hasn't been able to lose weight... we discussed diet + exercise needs to lose weight...    Current Problems:  PULMONARY NODULE (ICD-518.89) - vague nodular opacity right base over diaphragm on old films...  ~  1.7 cm benign granuloma... no change on serial CXR or CT's back to 2000.  ~  CXR 2/10 is WNL- nodule not seen...  ~  CXR 9/10 showed tort Ao, Cor WNL, lungs clear w/o nodule identified.  HYPERTENSION (ICD-401.9) - now controlled on TOPROL 50mg Bid and NORVASC 10mg /d... BP's at home in the 120-130/70 range, and measures 126/70 today- he denies HA, fatigue, visual changes, CP, palipit, dizziness,  syncope, dyspnea, edema, etc.  ~  he had a neg Cardiolite study in 10/04- no ischemia and EF=52%.  ~  Aug09: eval DrWall for atyp CP & risk factors--- Myoview 11/01/07 showed no evid for infarct, +diaph attenuation, EF sl reduced at 47%.  DEEP VENOUS THROMBOPHLEBITIS (ICD-453.40) - Dx'd by LMD in [place], [place] in 3/07... he was [facility]mill [facility] and had pain/ swelling L calf... ultrasound at [place] + for DVT & Rx COUMADIN by DrGolding (protimes q month)- no recent problems, he will check w/ DrGolding about discontinuing the Coumadin.  HYPERCHOLESTEROLEMIA (ICD-272.0) - controlled on SIMVASTATIN 40mg /d & FISH OIL...   ~  [place] 9/08 showed TChol 148, TG 200, HDL 30, LDL 78... on Advicor 500-20 at that time- keep same.  ~  [place] 3/09 showed TChol 214, TG 236, HDL 44, LDL 114... on Advicor- rec change to Simvastatin40.  ~  FLP 7/09 showed TChol 129, Tg 188, HDL 35, LDL 57... rec- keep same.  ~  FLP 1/10 showed TChol 142, TG 165, HDL 35, LDL 74  ~  FLP 9/10 showed TChol 146, TG 138, HDL 42, LDL 77  ~  FLP 3/11 showed TChol 127, TG 147, HDL 34, LDL 64  DM (ICD-250.00) - on GLUCOVANCE 2.5-500 1/2tab @dinner  only now (due to low sugars on larger dosage).  ~  labs  9/08 showed FBS=106 and HgA1C=6.5.Marland KitchenMarland Kitchen on Glucovance 1/2 Bid...  ~  labs 3/09 showed BS= 99, HgA1c= 7.0.Marland KitchenMarland Kitchen on Glucovance 1/2 Qam only- rec to incr to Bid again.  ~  labs 7/09 showed BS= 104, HgA1c= 6.8.Marland KitchenMarland Kitchen rec- keep same.  ~  ** pt reports sugars too low  ~50 at 11AM if he takes even 1/2 tab in AM, therefore changed to 1/2 at dinner only...  ~  labs 1/10 showed BS= 125, A1c= 7.0  ~  labs 9/10 showed BS= 102, A1c= 6.8  ~  labs 3/11 showed BS= 110, A1c= 7.0.Marland KitchenMarland Kitchen continue same meds, get wt down!  GERD (ICD-530.81) - takes OMEPRAZOLE 20mg  Prn for reflux symptoms...  ~  neg colonoscopy by DrStark in 6/00, and again 8/08 ( x sm hem's)... f/u planned 10 yrs.  BENIGN PROSTATIC HYPERTROPHY, WITH OBSTRUCTION (ICD-600.01) - he sees DrDavis  yearly- last Nov09 & pt reports "PSA was good"... S/P open prostatectomy 1999 by [id number]... Hx prostatitis in past... uses Cialis for ED.  ~  labs 3/11 showed BUN= 10, Crear= 1.2... PSA done by Urology.  RENAL CALCULUS (ICD-592.0)  DEGENERATIVE JOINT DISEASE (ICD-715.90) - today he is c/o pain on his right side and right elbow... we will try Rx w/ Etodolac, Parafon, heat and elbow pad... refer to ortho if symptoms persist...  GOUT (ICD-274.9) - states he had episode right elbow arthritis and gout Rx'd by DrSypher 4/09 w/ Colchicine & Allopurinol 100mg /d...  ~  labs 7/09 show Uric 6.1.Marland KitchenMarland Kitchen OK on Allopurinol 100mg /d...  ~  labs 1/10 showed Uric= 6.5  CARPAL TUNNEL SYNDROME (ICD-354.0) - treated by DrSypher over the years for CTS and mult trigger fingers...  KELOID (ICD-701.4)  Health Maintenance - pt had "bug bite" on leg 9/10 w/ Tetanus shot by DrGolding in [place]... he also had his FLu shot at that time... prev PNEUMOVAX 2000 at age 43, therefore due for f/u PNEUMOVAX now at age 9...   Allergies: 1)  ! Ace Inhibitors  Comments:  Nurse/Medical Assistant: The patient's medications and allergies were reviewed with the patient and were updated in the Medication and Allergy Lists.  Past History:  Past Medical History:  PULMONARY NODULE (ICD-518.89) HYPERTENSION (ICD-401.9) DEEP VENOUS THROMBOPHLEBITIS (ICD-453.40) HYPERCHOLESTEROLEMIA (ICD-272.0) DM (ICD-250.00) GERD (ICD-530.81) HEMORRHOIDS (ICD-455.6) BENIGN PROSTATIC HYPERTROPHY, WITH OBSTRUCTION (ICD-600.01) RENAL CALCULUS (ICD-592.0) DEGENERATIVE JOINT DISEASE (ICD-715.90) GOUT (ICD-274.9) CARPAL TUNNEL SYNDROME (ICD-354.0) KELOID (ICD-701.4)  Past Surgical History: S/P prostatectomy by [id number] 1999 S/P plastic surgery in 2000 S/P ortho surg on fingers by DrSypher 2005 & 2008  Family History: Reviewed history from 12/02/2008 and no changes required. Father died age 76 from cancer of the mouth Mother alive, age  53, w/ DM, arthritis 4 Sibling: 1 Bro- in good health 3 Sis- one died w/ Parkinson's dis, one w/ HBP.  Social History: Reviewed history from 12/02/2008 and no changes required. Married, 32yrs... 2 Children from prev marriage: 1 son murdered 2000 1 daughter in [place] w/ [place] Never smoked Social alcohol Retired from [employer]      See HPI  The patient denies anorexia, fever, weight loss, weight gain, vision loss, decreased hearing, hoarseness, chest pain, syncope, dyspnea on exertion, peripheral edema, prolonged cough, headaches, hemoptysis, abdominal pain, melena, hematochezia, severe indigestion/heartburn, hematuria, incontinence, muscle weakness, suspicious skin lesions, transient blindness, difficulty walking, depression, unusual weight change, abnormal bleeding, enlarged lymph nodes, and angioedema.    Vital Signs:  Patient profile:   70 year old male Height:      30  inches Weight:      235.25 pounds BMI:     31.15 O2 Sat:      99 % on Room air Temp:     97.6 degrees F oral Pulse rate:   70 / minute BP sitting:   126 / 70  (left arm) Cuff size:   regular  Vitals Entered By: Elita Boone CMA (May 31, 2009 10:29 AM)  O2 Sat at Rest %:  99 O2 Flow:  Room air CC: 6 month ROV & review of mult medical problems... Is Patient Diabetic? Yes Pain Assessment Patient in pain? yes      Onset of pain  RIGHT FLANK PAIN Comments MEDS UPDATED TODAY   Physical Exam  Additional Exam:  WD, WN, overwt 69 BM in NAD...  GENERAL:  Alert & oriented; pleasant & cooperative... HEENT:  [place]/AT, EOM-wnl, PERRLA, EACs-wax, TMs-wnl, NOSE-clear, THROAT-clear & wnl, no angioedema. NECK:  Supple w/ full ROM; no JVD; normal carotid impulses w/o bruits; no thyromegaly or nodules palpated; no lymphadenopathy. CHEST:  Clear to P & A; without wheezes/ rales/ or rhonchi. HEART:  Regular Rhythm; without murmurs/ rubs/ or gallops. ABDOMEN:  Soft & nontender; normal bowel sounds; no  organomegaly or masses detected. EXT:  without deformities, mild arthritic changes; no varicose veins/ venous insuffic/ or edema. NEURO:  CN's intact; motor testing normal; sensory testing normal; gait normal & balance OK. DERM:  Keloids noted...    MISC. Report  Procedure date:  05/31/2009  Findings:      Lipid Panel (LIPID)   Cholesterol               127 mg/dL                   0-200   Triglycerides             147.0 mg/dL                 0.0-149.0   HDL                  [L]  34.10 mg/dL                 >39.00   LDL Cholesterol           64 mg/dL                    0-99  BMP (METABOL)   Sodium                    143 mEq/L                   135-145   Potassium                 4.4 mEq/L                   3.5-5.1   Chloride                  107 mEq/L                   96-112   Carbon Dioxide            32 mEq/L                    19-32   Glucose              [H]  110 mg/dL  70-99   BUN                       10 mg/dL                    6-23   Creatinine                1.2 mg/dL                   0.4-1.5   Calcium                   9.5 mg/dL                   8.4-10.5   GFR                       77.41 mL/min                >60  Hemoglobin A1C (A1C)   Hemoglobin A1C       [H]  7.0 %                       4.6-6.5   Impression & Recommendations:  Problem # 1:  HYPERTENSION (ICD-401.9) BP controlled-  continue same meds. His updated medication list for this problem includes:    Metoprolol Tartrate 50 Mg Tabs (Metoprolol tartrate) .Marland Kitchen... Take 1 tablet by mouth two times a day    Amlodipine Besylate 10 Mg Tabs (Amlodipine besylate) .Marland Kitchen... 1 tab daily...  Orders: TLB-Lipid Panel (80061-LIPID) TLB-BMP (Basic Metabolic Panel-BMET) (99991111) TLB-A1C / Hgb A1C (Glycohemoglobin) (83036-A1C)  Problem # 2:  DEEP VENOUS THROMBOPHLEBITIS (ICD-453.40) Followed by DrGolding in [place] on Coumadin... he will inquire about Coumadin cessation...  Problem # 3:   HYPERCHOLESTEROLEMIA (ICD-272.0) FLP looks great on the simva40... His updated medication list for this problem includes:    Simvastatin 40 Mg Tabs (Simvastatin) .Marland Kitchen... Take one by mouth at bedtime  Problem # 4:  DM (ICD-250.00) A1c has increased to 7.0.Marland KitchenMarland Kitchen needs diet/ exercise/ get wt down or start new meds... he would like to avoid more medication- therefore must lose weight. His updated medication list for this problem includes:    Glyburide-metformin 2.5-500 Mg Tabs (Glyburide-metformin) .Marland Kitchen... Take 1/2 tab by mouth at dinnertime daily...  Problem # 5:  GERD (ICD-530.81) GI is stable... His updated medication list for this problem includes:    Omeprazole 20 Mg Cpdr (Omeprazole) .Marland Kitchen... Take 1 tab by mouth once daily as needed for stomach acid...  Problem # 6:  BENIGN PROSTATIC HYPERTROPHY, WITH OBSTRUCTION (ICD-600.01) Followed by [id number] for Urology...  Problem # 7:  OTHER MEDICAL PROBLEMS AS NOTED>>>  Complete Medication List: 1)  Warfarin Sodium 7.5 Mg Tabs (Warfarin sodium) .... Take as directed by drgolding... 2)  Metoprolol Tartrate 50 Mg Tabs (Metoprolol tartrate) .... Take 1 tablet by mouth two times a day 3)  Amlodipine Besylate 10 Mg Tabs (Amlodipine besylate) .Marland Kitchen.. 1 tab daily.Marland KitchenMarland Kitchen 4)  Simvastatin 40 Mg Tabs (Simvastatin) .... Take one by mouth at bedtime 5)  Fish Oil 1000 Mg Caps (Omega-3 fatty acids) .... Take one capsule every day 6)  Glyburide-metformin 2.5-500 Mg Tabs (Glyburide-metformin) .... Take 1/2 tab by mouth at dinnertime daily.Marland KitchenMarland Kitchen 7)  Omeprazole 20 Mg Cpdr (Omeprazole) .... Take 1 tab by mouth once daily as needed for stomach acid.Marland KitchenMarland Kitchen 8)  Allopurinol 100 Mg Tabs (Allopurinol) .... Take 1 tablet by  mouth once a day 9)  Colchicine 0.6 Mg Tabs (Colchicine) .... Take 1 tab by mouth once daily... 10)  Cialis 20 Mg Tabs (Tadalafil) .... Take as directed for e.d.Marland KitchenMarland KitchenMarland Kitchen 11)  One-a-day Mens Tabs (Multiple vitamin) .... Take 1 tablet by mouth once a day 12)  Accu-chek Softclix  Lancets Misc (Lancets) .... Use as directed 13)  Accu-chek Comfort Curve Strp (Glucose blood) .... Use as directed  Patient Instructions: 1)  Today we updated your med list- see below.... 2)  Continue your current meds the same... 3)  Today we did your follow up blood work... please call the "phone tree" in a few days for your lab results.Marland KitchenMarland Kitchen 4)  MrKing> let's get on track w/ our diet & exercise program... the goal is to lose 15-20 lbs... 5)  Call for any problems.Marland KitchenMarland Kitchen 6)  Please schedule a follow-up appointment in 6 months.

## 2010-04-12 NOTE — Progress Notes (Signed)
Summary: medication problems > change metformin to bid  Phone Note Call from Patient Call back at Home Phone [phone]   Caller: Patient Call For: nadel Summary of Call: Pt states his sugar has been running high since he's been taking metfromin 500mg  and glimepiride 1mg , the readings are 138-157, 161 and as high as 200, pls advise.//[facility] Initial call taken by: Netta Neat,  February 11, 2010 3:31 PM  Follow-up for Phone Call        Spoke with pt and he states over the last week and half his BS have been running higher then usual. He states he checks it every Am and it has been anywhere from 138-200. Pt states he feels great otherwise. Pt is on metformin 500mg  daily and glimeperide 1mg  daily. Pt states no changes in other meds and no changes in diet.  Please advise.McLennan Bing CMA  February 11, 2010 4:39 PM   Additional Follow-up for Phone Call Additional follow up Details #1::        per SN: rec change metformin ER to plain metformin 500mg  #60 or #180, 1 by mouth two times a day w/ breakfast and dinner.  keep the glimiperide the same in the mornings.  may increase the metformin ER to two times a day until he finishes that bottle.  called spoke with patient, advised of SN's recs as stated above.  pt verbazlied his understanding.  requests 90day supply.  rx sent to [facility] in [place]. Parke Poisson CNA/MA  February 11, 2010 5:08 PM     New/Updated Medications: METFORMIN HCL 500 MG TABS (METFORMIN HCL) Take 1 tablet by mouth two times a day Prescriptions: METFORMIN HCL 500 MG TABS (METFORMIN HCL) Take 1 tablet by mouth two times a day  #180 x 4   Entered by:   Parke Poisson CNA/MA   Authorized by:   Noralee Space MD   Signed by:   Parke Poisson CNA/MA on 02/11/2010   Method used:   Electronically to        [facility]. [phone] (retail)       [address]       [place], [place]  [address]       Ph: [phone] or [phone]   Fax: [phone]   [id number]

## 2010-04-12 NOTE — Letter (Signed)
Summary: [facility]   Imported By: Phillis Knack 02/10/2010 11:42:16  _____________________________________________________________________  External Attachment:    Type:   Image     Comment:   External Document

## 2010-05-13 ENCOUNTER — Other Ambulatory Visit: Payer: Self-pay | Admitting: Pulmonary Disease

## 2010-05-13 ENCOUNTER — Other Ambulatory Visit: Payer: Medicare Other

## 2010-05-13 ENCOUNTER — Ambulatory Visit (INDEPENDENT_AMBULATORY_CARE_PROVIDER_SITE_OTHER): Payer: Medicare Other | Admitting: Pulmonary Disease

## 2010-05-13 ENCOUNTER — Encounter: Payer: Self-pay | Admitting: Pulmonary Disease

## 2010-05-13 DIAGNOSIS — I10 Essential (primary) hypertension: Secondary | ICD-10-CM

## 2010-05-13 DIAGNOSIS — N401 Enlarged prostate with lower urinary tract symptoms: Secondary | ICD-10-CM

## 2010-05-13 DIAGNOSIS — E119 Type 2 diabetes mellitus without complications: Secondary | ICD-10-CM

## 2010-05-13 DIAGNOSIS — K219 Gastro-esophageal reflux disease without esophagitis: Secondary | ICD-10-CM

## 2010-05-13 DIAGNOSIS — M1990 Unspecified osteoarthritis, unspecified site: Secondary | ICD-10-CM

## 2010-05-13 DIAGNOSIS — E7800 Pure hypercholesterolemia, unspecified: Secondary | ICD-10-CM

## 2010-05-13 DIAGNOSIS — I82409 Acute embolism and thrombosis of unspecified deep veins of unspecified lower extremity: Secondary | ICD-10-CM

## 2010-05-13 DIAGNOSIS — M109 Gout, unspecified: Secondary | ICD-10-CM

## 2010-05-13 LAB — BASIC METABOLIC PANEL
BUN: 16 mg/dL (ref 6–23)
GFR: 86.26 mL/min (ref >60.00)
Glucose, Bld: 113 mg/dL — ABNORMAL HIGH (ref 70–99)
Potassium: 4.5 mEq/L (ref 3.5–5.1)
Sodium: 139 mEq/L (ref 135–145)

## 2010-05-13 LAB — LIPID PANEL
HDL: 42.9 mg/dL (ref >39.00)
LDL Cholesterol: 98 mg/dL (ref 0–99)
Total CHOL/HDL Ratio: 4
VLDL: 20.6 mg/dL (ref 0.0–40.0)

## 2010-05-14 ENCOUNTER — Encounter: Payer: Self-pay | Admitting: Pulmonary Disease

## 2010-05-19 NOTE — Assessment & Plan Note (Signed)
Summary: cpx   CC:  6 month ROV & review of mult medical problems....  History of Present Illness: 70 y/o BM here for a follow up visit... he has mult med prob as noted below...   ~  November 30, 2009:  he states he had a gout flair- occurred after stopping Allopurinol (?why) & placed on Probenecid by DrGolding- he wants to restart the Allopurinol gout preventive & I agree...  as before weight= the same & can't seem to lose any despite diet efforts> BP controlled on meds;  needs f/u fasting blood work to check Chol (FLP=ok) & A1c (BS129, A1c7.7)> he had decr his Glucovance on his own due to "low sugars" so we switched him to Metformin 500Bid & Glimep 1mg /d;  OK Flu shot today.   ~  May 13, 2010:  he hasn't had any gout or arthritic problems since getting back on the Allopurinol... he notes BP controlled on the Toprol/ Norvasc & he exercises on a treadmill for 15-20 min doing OK... he remains on coumadin followed by DrGolding in [place]... Lipids look good on diet + Simva40/ FishOil... but his DM control has not been adeq on current doses of Metform/ Glimep> BS at home runs up to 180 he says & labs today showed BS113, A1c=9.0.Marland KitchenMarland Kitchen we discussed diet & exercise & rec to double current meds> incr Metform500- 2Bid, and Glimep2mg Qam... his wt is unchanged at 232# & states his diet consists of "cutting back on sugar", & "not eating much bread"> we discussed this.   Current Problems:  PULMONARY NODULE (ICD-518.89) - vague nodular opacity right base over diaphragm on old films...  ~  1.7 cm benign granuloma... no change on serial CXR or CT's back to 2000.  ~  CXR 2/10 is WNL- nodule not seen...  ~  CXR 9/10 showed tort Ao, Cor WNL, lungs clear w/o nodule identified.  ~  CXR 9/11 showed clear lungs, NAD...  HYPERTENSION (ICD-401.9) - controlled on TOPROL 50mg Bid and NORVASC 10mg /d... BP's at home in the 120-130/70 range, and measures 120/80 today- he denies HA, fatigue, visual changes, CP, palipit,  dizziness, syncope, dyspnea, edema, etc.  ~  he had a neg Cardiolite study in 10/04- no ischemia and EF=52%.  ~  Aug09: eval DrWall for atyp CP & risk factors> Myoview 11/01/07 showed no evid for infarct, +diaph attenuation, EF sl reduced at 47%.  DEEP VENOUS THROMBOPHLEBITIS (ICD-453.40) - Dx'd by LMD in [place], [place] in 3/07... [facility] and [facility] pain/ swelling L calf... ultrasound at [facility] + for DVT & Rx COUMADIN by DrGolding (protimes q month)- no recent problems, he will check w/ DrGolding about discontinuing the Coumadin.  HYPERCHOLESTEROLEMIA (ICD-272.0) - controlled on SIMVASTATIN 40mg /d & FISH OIL...   ~  [place] 9/08 showed TChol 148, TG 200, HDL 30, LDL 78... on Advicor 500-20 at that time- keep same.  ~  [place] 3/09 showed TChol 214, TG 236, HDL 44, LDL 114... on Advicor- rec change to Simvastatin40.  ~  FLP 7/09 showed TChol 129, Tg 188, HDL 35, LDL 57... rec- keep same.  ~  FLP 1/10 showed TChol 142, TG 165, HDL 35, LDL 74... stable on simva40.  ~  FLP 9/10 showed TChol 146, TG 138, HDL 42, LDL 77  ~  FLP 3/11 showed TChol 127, TG 147, HDL 34, LDL 64  ~  FLP 9/11 showed TChol 143, TG 151, HDL 36, LDL 77  ~  FLP 3/12 showed TChol  161, TG 103, HDL 43, LDL 98  DM (ICD-250.00) - currently on METFORMIN 500mg Bid & GLIMEPIRIDE 1mg /d (going slowly due to prev hypoglycemia from glucovance).  ~  labs 9/08 showed FBS=106 and HgA1C=6.5.Marland KitchenMarland Kitchen on Glucovance 1/2 Bid.  ~  labs 3/09 showed BS= 99, HgA1c= 7.0.Marland KitchenMarland Kitchen on Glucovance 1/2 Qam only- rec to incr to Bid again.  ~  labs 7/09 showed BS= 104, HgA1c= 6.8.Marland KitchenMarland Kitchen rec- keep same.  ~  pt reports sugars too low  ~50 at 11AM if he takes even 1/2 tab in AM, therefore he changed to 1/2 at dinner only...  ~  labs 1/10 showed BS= 125, A1c= 7.0  ~  labs 9/10 showed BS= 102, A1c= 6.8  ~  labs 3/11 showed BS= 110, A1c= 7.0.Marland KitchenMarland Kitchen continue same meds, get wt down!  ~  labs 9/11 showed BS= 129, A1c= 7.7.Marland KitchenMarland Kitchen rec change to METFORMIN ER 500mg  Qam &  GLIMEPIRIDE 1mg  Qam.  ~  labs 3/12 showed BS= 113, A1c= 9.0.Marland KitchenMarland Kitchen rec to double meds> Metform500-2Bid, Glim2mg AM.  GERD (ICD-530.81) - takes OMEPRAZOLE 20mg  Prn for reflux symptoms...  ~  neg colonoscopy by DrStark in 6/00, and again 8/08 ( x sm hem's)... f/u planned 10 yrs.  BENIGN PROSTATIC HYPERTROPHY, WITH OBSTRUCTION (ICD-600.01) - he sees DrDavis yearly- pt reports "PSA was good" (we don't have notes from him)... S/P open prostatectomy 1999 by [id number]... Hx prostatitis in past... uses Cialis for ED.  ~  labs 3/11 showed BUN= 10, Crear= 1.2... PSA done by Urology.  ~  labs 9/11 showed BUN= 11, Creat= 1.3, PSA= 1.13  ~  he had f/u Urology DrBorden- BPH/ LUTS, PSA screening- "it was good" per pt.  RENAL CALCULUS (ICD-592.0)  DEGENERATIVE JOINT DISEASE (ICD-715.90) - hx of pain on his right side and right elbow... prev Rx w/ Etodolac, Parafon, heat and elbow pad... refer to ortho if symptoms persist... now he uses OTC anti-inflamm Rx Prn.  GOUT (ICD-274.9) - states he had episode right elbow arthritis and gout Rx'd by DrSypher 4/09 w/ Colchicine & Allopurinol 100mg /d...  ~  labs 7/09 show Uric 6.1.Marland KitchenMarland Kitchen OK on Allopurinol 100mg /d...  ~  labs 1/10 showed Uric= 6.5  ~  9/11:  pt reports that he stopped Allopurinol earlier this yr & had recent gout attack per DrGolding Rx w/ Probenecid... he would like to restart the Allopurinol preventive Rx & I agree> ALLOPURINOL 300mg /d written.  CARPAL TUNNEL SYNDROME (ICD-354.0) - treated by DrSypher over the years for CTS and mult trigger fingers (w/ surg)...  KELOID (ICD-701.4)  Health Maintenance - pt had "bug bite" on leg 9/10 w/ Tetanus shot by DrGolding in [place]... prev PNEUMOVAX 2000 at age 58, therefore given f/u PNEUMOVAX in 2000 at age 22... he gets yearly flu vaccine each fall.   Preventive Screening-Counseling & Management  Alcohol-Tobacco     Smoking Status: never  Allergies: 1)  ! Ace Inhibitors  Comments:  Nurse/Medical  Assistant: The patient's medications and allergies were reviewed with the patient and were updated in the Medication and Allergy Lists.  Past History:  Past Medical History: PULMONARY NODULE (ICD-518.89) HYPERTENSION (ICD-401.9) DEEP VENOUS THROMBOPHLEBITIS (ICD-453.40) HYPERCHOLESTEROLEMIA (ICD-272.0) DM (ICD-250.00) GERD (ICD-530.81) HEMORRHOIDS (ICD-455.6) BENIGN PROSTATIC HYPERTROPHY, WITH OBSTRUCTION (ICD-600.01) RENAL CALCULUS (ICD-592.0) DEGENERATIVE JOINT DISEASE (ICD-715.90) GOUT (ICD-274.9) CARPAL TUNNEL SYNDROME (ICD-354.0) KELOID (ICD-701.4)  Past Surgical History: S/P prostatectomy by [id number] 1999 S/P plastic surgery in 2000 S/P ortho surg on fingers by DrSypher 2005 & 2008  Family History: Reviewed history from 11/30/2009 and no changes required. Father  died age 63 from cancer of the mouth Mother alive, age 36, w/ DM, arthritis 4 Sibling: 1 Bro- in good health 3 Sis- one died w/ Parkinson's dis, one w/ HBP  Social History: Reviewed history from 11/30/2009 and no changes required. Married, 19yrs... 2 Children from prev marriage: 1 son murdered 2000 1 daughter in [place] w/ [place] Never smoked Social alcohol Retired from [employer]      See HPI       The patient complains of dyspnea on exertion.  The patient denies anorexia, fever, weight loss, weight gain, vision loss, decreased hearing, hoarseness, chest pain, syncope, peripheral edema, prolonged cough, headaches, hemoptysis, abdominal pain, melena, hematochezia, severe indigestion/heartburn, hematuria, incontinence, muscle weakness, suspicious skin lesions, transient blindness, difficulty walking, depression, unusual weight change, abnormal bleeding, enlarged lymph nodes, and angioedema.    Vital Signs:  Patient profile:   70 year old male Height:      73 inches Weight:      232.25 pounds BMI:     30.75 O2 Sat:      98 % on Room air Temp:     98.5 degrees F oral Pulse rate:   58 /  minute BP sitting:   120 / 80  (left arm) Cuff size:   regular  Vitals Entered By: Elita Boone CMA (May 13, 2010 9:18 AM)  O2 Sat at Rest %:  98 O2 Flow:  Room air CC: 6 month ROV & review of mult medical problems... Is Patient Diabetic? Yes Pain Assessment Patient in pain? yes      Onset of pain  some arthritis pain Comments no changes in meds today   Physical Exam  Additional Exam:  WD, WN, overwt 70 BM in NAD...  GENERAL:  Alert & oriented; pleasant & cooperative... HEENT:  [place]/AT, EOM-wnl, PERRLA, EACs-wax, TMs-wnl, NOSE-clear, THROAT-clear & wnl, no angioedema. NECK:  Supple w/ full ROM; no JVD; normal carotid impulses w/o bruits; no thyromegaly or nodules palpated; no lymphadenopathy. CHEST:  Clear to P & A; without wheezes/ rales/ or rhonchi. HEART:  Regular Rhythm; without murmurs/ rubs/ or gallops. ABDOMEN:  Soft & nontender; normal bowel sounds; no organomegaly or masses detected. RECTAL:  prostate 3+, no nodules, stool heme neg... EXT:  without deformities, mild arthritic changes; no varicose veins/ venous insuffic/ or edema. NEURO:  CN's intact; motor testing normal; sensory testing normal; gait normal & balance OK. DERM:  Keloids noted...    Impression & Recommendations:  Problem # 1:  HYPERTENSION (ICD-401.9) BP controlled on meds>  continue same... His updated medication list for this problem includes:    Metoprolol Tartrate 50 Mg Tabs (Metoprolol tartrate) .Marland Kitchen... Take 1 tablet by mouth two times a day    Amlodipine Besylate 10 Mg Tabs (Amlodipine besylate) .Marland Kitchen... 1 tab daily...  Orders: TLB-BMP (Basic Metabolic Panel-BMET) (99991111) TLB-A1C / Hgb A1C (Glycohemoglobin) (83036-A1C) TLB-Lipid Panel (80061-LIPID)  Problem # 2:  DEEP VENOUS THROMBOPHLEBITIS (ICD-453.40) Followed by drGolding in >  he will discuss f/u dopplers & the poss of stopping the Coumadin w/ drGolding.  Problem # 3:  HYPERCHOLESTEROLEMIA (ICD-272.0) FLP looks good  on simva40>  continue same. His updated medication list for this problem includes:    Simvastatin 40 Mg Tabs (Simvastatin) .Marland Kitchen... Take one by mouth at bedtime  Problem # 4:  DM (ICD-250.00) His A1c is up to 9.0 on the Metform500Bid & Glimep1mg /d... rec to double his meds to Metform500-2Bid & Glimep2mg Qam...watch sugars at home & call for problems.Marland KitchenMarland Kitchen  His updated medication list for this problem includes:    Metformin Hcl 500 Mg Tabs (Metformin hcl) .Marland Kitchen... Take 1 tablet by mouth two times a day    Glimepiride 1 Mg Tabs (Glimepiride) .Marland Kitchen... Take 1 tab by mouth once daily in the am...  Problem # 5:  BENIGN PROSTATIC HYPERTROPHY, WITH OBSTRUCTION (ICD-600.01) Followed by drBorden now & stable...  Problem # 6:  DEGENERATIVE JOINT DISEASE (ICD-715.90) Hx DJD & Gout> stable on the allopurinol...  Complete Medication List: 1)  Warfarin Sodium 7.5 Mg Tabs (Warfarin sodium) .... Take as directed by drgolding... 2)  Metoprolol Tartrate 50 Mg Tabs (Metoprolol tartrate) .... Take 1 tablet by mouth two times a day 3)  Amlodipine Besylate 10 Mg Tabs (Amlodipine besylate) .Marland Kitchen.. 1 tab daily.Marland KitchenMarland Kitchen 4)  Simvastatin 40 Mg Tabs (Simvastatin) .... Take one by mouth at bedtime 5)  Fish Oil 1000 Mg Caps (Omega-3 fatty acids) .... Take one capsule every day 6)  Metformin Hcl 500 Mg Tabs (Metformin hcl) .... Take 1 tablet by mouth two times a day 7)  Glimepiride 1 Mg Tabs (Glimepiride) .... Take 1 tab by mouth once daily in the am... 8)  Omeprazole 20 Mg Cpdr (Omeprazole) .... Take 1 tab by mouth once daily as needed for stomach acid.Marland KitchenMarland Kitchen 9)  Allopurinol 300 Mg Tabs (Allopurinol) .... Take 1 tab by mouth once daily... 10)  Probenecid 500 Mg Tabs (Probenecid) .... As directed by drgolding... 11)  Cialis 20 Mg Tabs (Tadalafil) .... Take as directed for e.d.Marland KitchenMarland KitchenMarland Kitchen 12)  One-a-day Mens Tabs (Multiple vitamin) .... Take 1 tablet by mouth once a day 13)  Accu-chek Softclix Lancets Misc (Lancets) .... Use as directed 14)  Accu-chek  Aviva Strp (Glucose blood) .... Test twice daily  Patient Instructions: 1)  Today we updated your med list- see below.... 2)  Today we rechecked your FASTING blood work... based on the results we may have to inrease your DM meds... 3)  please call the "phone tree" in a few days for your lab results.Marland KitchenMarland Kitchen 4)  Let's get on track w/ our DIET DIEt DIet Diet diet.Marland KitchenMarland Kitchen 5)  Call for any questions.Marland KitchenMarland Kitchen 6)  Please schedule a follow-up appointment in 6 months, sooner as needed.

## 2010-05-19 NOTE — Miscellaneous (Signed)
  Medications Added METFORMIN HCL 500 MG TABS (METFORMIN HCL) Take 2 tablets by mouth two times a day GLIMEPIRIDE 2 MG TABS (GLIMEPIRIDE) take 1 tab by mouth once daily...       Clinical Lists Changes  Medications: Changed medication from METFORMIN HCL 500 MG TABS (METFORMIN HCL) Take 1 tablet by mouth two times a day to METFORMIN HCL 500 MG TABS (METFORMIN HCL) Take 2 tablets by mouth two times a day - Signed Changed medication from GLIMEPIRIDE 1 MG TABS (GLIMEPIRIDE) take 1 tab by mouth once daily in the am... to GLIMEPIRIDE 2 MG TABS (GLIMEPIRIDE) take 1 tab by mouth once daily... - Signed Rx of METFORMIN HCL 500 MG TABS (METFORMIN HCL) Take 2 tablets by mouth two times a day;  #120 x 12;  Signed;  Entered by: Noralee Space MD;  Authorized by: Noralee Space MD;  Method used: Print then Give to Patient Rx of GLIMEPIRIDE 2 MG TABS (GLIMEPIRIDE) take 1 tab by mouth once daily...;  #30 x 12;  Signed;  Entered by: Noralee Space MD;  Authorized by: Noralee Space MD;  Method used: Print then Give to Patient Observations: Added new observation of LLIMPORTMEDS: completed (05/14/2010 11:26)    Prescriptions: GLIMEPIRIDE 2 MG TABS (GLIMEPIRIDE) take 1 tab by mouth once daily...  #30 x 12   Entered and Authorized by:   Noralee Space MD   Signed by:   Noralee Space MD on 05/14/2010   Method used:   Print then Give to Patient   RxID:   [id number] METFORMIN HCL 500 MG TABS (METFORMIN HCL) Take 2 tablets by mouth two times a day  #120 x 12   Entered and Authorized by:   Noralee Space MD   Signed by:   Noralee Space MD on 05/14/2010   Method used:   Print then Give to Patient   RxID:   [id number]

## 2010-06-15 ENCOUNTER — Other Ambulatory Visit: Payer: Self-pay | Admitting: Pulmonary Disease

## 2010-07-05 ENCOUNTER — Other Ambulatory Visit: Payer: Self-pay | Admitting: Pulmonary Disease

## 2010-07-26 NOTE — Op Note (Signed)
NAME:  [name], [name]                ACCOUNT NO.:  [id number]   MEDICAL RECORD NO.:  [id number]          PATIENT TYPE:  AMB   LOCATION:  [place]                          FACILITY:  [facility]   PHYSICIAN:  Youlanda Mighty. Sypher, M.D. DATE OF BIRTH:  29-Jun-1940   DATE OF PROCEDURE:  DATE OF DISCHARGE:  02/22/2007                               OPERATIVE REPORT   PREOPERATIVE DIAGNOSIS:  Chronic stenosing tenosynovitis of the left  small finger at the A1 pulley.   POSTOPERATIVE DIAGNOSIS:  Chronic stenosing tenosynovitis of the left  small finger at the A1 pulley.   OPERATIONS:  Release of left small finger A1 pulley.   OPERATING SURGEON:  Youlanda Mighty. Sypher, M.D.   ASSISTANT:  Leverne Humbles, PA-C   ANESTHESIA:  2% lidocaine metacarpal head-level block of left small  finger supplemented by IV sedation.  Supervising anesthesiologist is Dr.  Chriss Driver.   INDICATIONS:  Hector Rose is a 70 year old gentleman referred through  the courtesy of Dr. Teressa Lower for evaluation and management of chronic  stenosing tenosynovitis of his left small finger.   Due to a failure to respond to nonoperative measures, he is brought to  the operating room at this time for release of his left small finger A1  pulley.   PROCEDURE:  Hector Rose was brought to the operating room and placed in  supine position on the operating table.  Following general sedation, the  left arm was prepped with Betadine soap and solution and sterilely  draped.  A pneumatic tourniquet was applied about the proximal right  brachium.  Following exsanguination of the left arm with an Esmarch  bandage, arterial tourniquet was inflated to 230 mmHg.  The procedure  commenced with a short transverse incision just distal to the distal  palmar crease overlying the palpably thickened A1 pulley.  The  subcutaneous tissues were carefully divided taking care to gently  retract the neurovascular bundles.  After isolation of the pulley, the  pulley was  split with scalpel and scissors.  The tendons were delivered  and found be otherwise normal.  Thereafter free range of motion of the  small finger was recovered.   The wound was repaired with mattress sutures of 5-0 nylon.   There were no apparent complications.   Hector Rose tolerated surgery and anesthesia well.  He was transferred to  the recovery room with stable signs.      Youlanda Mighty Sypher, M.D.  Electronically Signed     RVS/MEDQ  D:  02/22/2007  T:  02/22/2007  Job:  [id number]   cc:   Deborra Medina. Lenna Gilford, MD

## 2010-07-26 NOTE — Assessment & Plan Note (Signed)
[facility] OFFICE NOTE   [name], [name]                         MRN:          [id number]  DATE:10/15/2007                            DOB:          1940-07-17    I was asked by Dr. Sharilyn Sites to consult on [name], a  delightful 70 year old African-American male who comes today with the  chief complaint of chest pain.   This past weekend, he noticed some aching under his left breast up to  his left shoulder.  It would come and go.  He had no associated  symptoms.  This occurred again after eating dinner at church on Sunday  evening.   That evening, he had quite a bit of gas and belching.   He has multiple risk factors for cardiac disease or coronary disease.  These include age, sex, type 2 diabetes with hemoglobin A1c reaching a  6.8%, mixed hyperlipidemia with his LDL at goal on simvastatin 40 mg  q.h.s., and hypertension.  He does not smoke.   He is currently on,  1. Allopurinol 100 mg a day.  2. Aspirin 81 mg a day.  3. Multivitamin.  4. Glyburide.  5. Metformin 2.5/500 a day.  6. Simvastatin 40 mg a day.  7. Amlodipine 10 mg a day.  8. Metoprolol 50 b.i.d.  9. Warfarin as directed.   His past medical history is significant for history of a left lower  extremity DVT diagnosed because of lower extremity edema and pain back  in December 2008.  He has history of gout.   Previous surgery, plastic surgery in 2000 and finger surgery as an  outpatient.  He does not drink alcohol.   He is intolerant of all ACE inhibitors.   SOCIAL HISTORY:  He is retired.  He is married.  He lives at [facility].  His wife is with him today.  She is in childcare.   FAMILY HISTORY:  Negative for premature coronary artery disease.   REVIEW OF SYSTEMS:  He wears reading glasses.  The rest of his review of  systems are all questioned and are negative.  Please refer to the HPI  for others.   PHYSICAL EXAMINATION:  VITAL SIGNS:  Blood pressure is 100/72.  His  pulse is 57 and regular.  EKG shows sinus brady with first AV block,  which is unchanged from his ECG in the past.  GENERAL:  His weight is 233.  He is very pleasant.  He is in no acute  distress.  SKIN:  Warm and dry.  Alert and oriented x3.  HEENT:  Normocephalic and atraumatic.  PERRLA.  Extraocular movements  intact.  He has arcus senilis.  Facial symmetry is normal.  Dentition  satisfactory.  Carotid upstrokes were equal bilaterally without bruits.  No JVD.  Thyroid is not enlarged.  Trachea is midline.  NECK:  Supple.  LUNGS:  Clear to auscultation.  HEART:  Nondisplaced PMI.  Normal S1 and S2.  No gallop.  ABDOMEN:  Protuberant with good bowel sounds.  No midline bruit.  No  hepatomegaly and no organomegaly.  EXTREMITIES:  No cyanosis, clubbing, or edema.  Pulses are intact.  NEUROLOGIC:  Intact.  SKIN:  Shows some keloids across his chest.   ASSESSMENT AND PLAN:  [name] probably has noncardiac chest pain;  however, he has multiple cardiac risk factor and is certainly in a  moderate to high risk of having coronary event in the next 5-10 years.  He is very active and we need to rule out any obstructive coronary  artery disease.   PLAN:  1. Exercise stress test Myoview, off metoprolol.  2. Continue aggressive risk factor modification with Dr. Hilma Favors.  3. Follow up with [place] p.r.n. unless his Myoview is positive.   I have discuss this with his wife and as well as with him.  They are in  agreement with the plan.     Thomas C. Verl Blalock, MD, [facility]  Electronically Signed    TCW/MedQ  DD: 10/15/2007  DT: 10/16/2007  Job #: [id number]   cc:   Halford Chessman, M.D.

## 2010-07-26 NOTE — Assessment & Plan Note (Signed)
[facility]                             PULMONARY OFFICE NOTE   Hector Rose, Hector Rose                         MRN:          [id number]  DATE:01/21/2007                            DOB:          1941-02-03    HISTORY OF PRESENT ILLNESS:  The patient is a very pleasant 70 year old  African-American male patient of Dr. Jeannine Kitten who has a known history of  hypertension, diabetes mellitus, and hyperlipidemia, who presents today  for an acute office visit.  The patient complains that two days ago he  woke up and noticed he had significant limp, swelling.  He subsequently  went to the emergency room and was felt to have angioedema secondary to  his ACE inhibitor.  He was discontinued off of lisinopril and started on  a prednisone taper and Pepcid at bedtime.  He has reported symptoms have  substantially improved.  Swelling is almost totally resolved.  He denies  any chest pain, palpitations, difficulty swallowing, rash, abdominal  pain, nausea, vomiting, or fever.  The patient has been on lisinopril  for several years for his hypertension.  The patient has had no previous  episodes of angioedema.  He has no known history of drug reactions in  the past.   PAST MEDICAL HISTORY:  Reviewed.   CURRENT MEDICATIONS:  Reviewed.   PHYSICAL EXAMINATION:  The patient is a pleasant male in no acute  distress.  He is afebrile with stable vital signs.  Blood pressure is 124/78.  O2  saturation is 98% on room air.  HEENT:  Conjunctivae benign, injected.  Nasal mucosa is pink and moist.  Posterior pharynx is clear without any exudate, swelling.  Tongue is  midline without swelling.  The lips are normal without any notable  swelling.  TMs normal.  NECK:  Supple without cervical adenopathy.  No JVD.  LUNGS:  Lung sounds are clear without any wheezing, crackles.  CARDIAC:  Regular rate and rhythm.  ABDOMEN:  Soft and nontender.  EXTREMITIES:  Warm without any edema.  SKIN:   Warm without rash.  No swelling is noted.   IMPRESSION/PLAN:  1. Angioedema, suspected secondary to ACE inhibitor.  The patient is      now stopped off his lisinopril.  Symptoms seem to be improved.  He      is to finish his prednisone and Pepcid as recommended and avoid ACE      inhibitors in the future.  2. Hypertension.  The patient will add in metoprolol 25 mg b.i.d.  The      patient will return back here in four weeks for follow-up or sooner      if needed.  The patient is advised to check blood pressures on      average 3-4 mornings a week and record blood pressures log to bring      in for      his next visit.  The patient is encouraged to continue on low      sodium diet.  The patient will return back as scheduled or sooner  if needed.      Rexene Edison, NP  Electronically Signed      Deborra Medina. Lenna Gilford, MD  Electronically Signed   TP/MedQ  DD: 01/21/2007  DT: 01/22/2007  Job #: [id number]

## 2010-07-29 NOTE — Op Note (Signed)
NAME:  Hector Rose, STRZELCZYK                            ACCOUNT NO.:  [id number]   MEDICAL RECORD NO.:  [id number]                   PATIENT TYPE:  AMB   LOCATION:  [place]                                  FACILITY:  [facility]   PHYSICIAN:  Youlanda Mighty. Luisa Dago., M.D.          DATE OF BIRTH:  October 04, 1940   DATE OF PROCEDURE:  11/13/2003  DATE OF DISCHARGE:                                 OPERATIVE REPORT   PREOPERATIVE DIAGNOSES:  1.  Locking stenosing tenosynovitis, left ring finger, A-1 pulley.  2.  Left carpal tunnel syndrome with positive electrodiagnostic studies.   POSTOPERATIVE DIAGNOSES:  1.  Locking stenosing tenosynovitis, left ring finger, A-1 pulley.  2.  Left carpal tunnel syndrome with positive electrodiagnostic studies.   OPERATION PERFORMED:  1.  Release of left ring finger A-1 pulley.  2.  Injection of left ulnar bursa with Depo-Medrol and 1% plain lidocaine.   SURGEON:  Youlanda Mighty. Sypher, M.D.   ASSISTANT:  Julian Reil, P.A.   ANESTHESIA:  0.25% Marcaine and 2% lidocaine metacarpal head level block of  left ring finger supplemented by IV sedation.   SUPERVISING ANESTHESIOLOGIST:  Nelda Severe. Tobias Alexander, M.D.   INDICATIONS FOR PROCEDURE:  Hector Rose is a 70 year old gentleman employed  at [employer].  He is a long term patient.  He has had a history of stenosing  tenosynovitis of multiple fingers and thumbs.  He recently presented with a  locking left ring finger and requested release of the A-1 pulley based on  past experience.  He declined steroid injection.  He also noted numbness in  both hands, left worse than right while driving or at night.  Clinical  examination revealed a positive __________ test and an equivocal Tinel sign.   Dr. Zebedee Iba performed detailed electrodiagnostic studies which revealed  bilateral carpal tunnel syndrome, left worse than right.  We initially  offered Mr. Coln a procedure combining release of the A-1 pulley of the left  ring finger and  release of the left transverse carpal ligament.  Due time  constraints at work and an inability to take time off from work at this time  he elected to proceed with trigger release but would prefer to have steroid  injection into the left bursa rather than proceeding directly to release the  transverse carpal ligament at this time.   He is inclined to seek treatment for the carpal tunnel syndrome at a later  date should it remain problematic not respond to injection.   DESCRIPTION OF PROCEDURE:  Shasta Capstick was brought to the operating room  and placed in supine position upon the operating table.  Following induction  of general anesthesia by LMA, the left arm was prepped with Betadine soap  and solution and sterilely draped.  Following exsanguination of the limb  with an Esmarch bandage, an arterial tourniquet on the proximal brachium was  inflated to 220 mmHg.  2% lidocaine  and 0.25% Marcaine were infiltrated at  the metacarpal head level to obtain a digital block.  When anesthesia was  satisfactory, the procedure commenced with a short transverse directly over  the A-1 pulley of the left ring finger.  Subcutaneous tissues are carefully  divided releasing the palmar fascia.  The pulley was isolated and split with  scissors along its radial border.  This corrected the locking predicament of  the flexor tendons.  No A-0 pulley was located.  This wound was then  repaired with intradermal 3-0 Prolene suture.  Attention was then directed  to the ulna bursa. A mixture of Depo-Medrol 40 mg per ml, 1 ml of steroid  and 1 mL of 1% lidocaine without epinephrine was injected into the ulnar  bursa ulnar to the palmaris longus with the fingers in full extension after  inserting the needle with the fingers in full flexion.  This ensures  injection into the ulnar bursa without impaling a flexor tendon.  The  injection was accomplished without complication.  The wounds were then  dressed with Xeroflo,  sterile gauze and Ace wrap.                                               Youlanda Mighty Luisa Dago., M.D.    RVS/MEDQ  D:  11/13/2003  T:  11/14/2003  Job:  [id number]

## 2010-08-14 ENCOUNTER — Other Ambulatory Visit: Payer: Self-pay | Admitting: Pulmonary Disease

## 2010-10-18 ENCOUNTER — Other Ambulatory Visit: Payer: Self-pay | Admitting: Pulmonary Disease

## 2010-11-15 ENCOUNTER — Ambulatory Visit: Payer: Medicare Other

## 2010-11-15 DIAGNOSIS — Z0000 Encounter for general adult medical examination without abnormal findings: Secondary | ICD-10-CM

## 2010-11-15 LAB — HEPATIC FUNCTION PANEL
ALT: 24 U/L (ref 0–53)
Total Bilirubin: 0.7 mg/dL (ref 0.3–1.2)

## 2010-11-15 LAB — URINALYSIS
Bilirubin Urine: NEGATIVE
Nitrite: NEGATIVE
Specific Gravity, Urine: 1.025 (ref 1.000–1.030)
Total Protein, Urine: NEGATIVE
pH: 6 (ref 5.0–8.0)

## 2010-11-15 LAB — LIPID PANEL
Cholesterol: 196 mg/dL (ref 0–200)
LDL Cholesterol: 123 mg/dL — ABNORMAL HIGH (ref 0–99)
Triglycerides: 119 mg/dL (ref 0.0–149.0)

## 2010-11-15 LAB — CBC WITH DIFFERENTIAL/PLATELET
Eosinophils Relative: 1.2 % (ref 0.0–5.0)
HCT: 46.3 % (ref 39.0–52.0)
Lymphs Abs: 1.8 10*3/uL (ref 0.7–4.0)
MCV: 95.2 fl (ref 78.0–100.0)
Monocytes Absolute: 0.6 10*3/uL (ref 0.1–1.0)
Platelets: 179 10*3/uL (ref 150.0–400.0)
RDW: 14.4 % (ref 11.5–14.6)
WBC: 10.2 10*3/uL (ref 4.5–10.5)

## 2010-11-15 LAB — BASIC METABOLIC PANEL
BUN: 17 mg/dL (ref 6–23)
CO2: 31 mEq/L (ref 19–32)
Chloride: 103 mEq/L (ref 96–112)
Glucose, Bld: 120 mg/dL — ABNORMAL HIGH (ref 70–99)
Potassium: 5 mEq/L (ref 3.5–5.1)

## 2010-11-17 ENCOUNTER — Other Ambulatory Visit (INDEPENDENT_AMBULATORY_CARE_PROVIDER_SITE_OTHER): Payer: Medicare Other

## 2010-11-17 ENCOUNTER — Encounter: Payer: Self-pay | Admitting: Pulmonary Disease

## 2010-11-17 ENCOUNTER — Ambulatory Visit (INDEPENDENT_AMBULATORY_CARE_PROVIDER_SITE_OTHER)
Admission: RE | Admit: 2010-11-17 | Discharge: 2010-11-17 | Disposition: A | Discharge status: Home / Self Care | Payer: Medicare Other | Source: Ambulatory Visit | Attending: Pulmonary Disease | Admitting: Pulmonary Disease

## 2010-11-17 ENCOUNTER — Ambulatory Visit (INDEPENDENT_AMBULATORY_CARE_PROVIDER_SITE_OTHER): Payer: Medicare Other | Admitting: Pulmonary Disease

## 2010-11-17 DIAGNOSIS — E119 Type 2 diabetes mellitus without complications: Secondary | ICD-10-CM

## 2010-11-17 DIAGNOSIS — K219 Gastro-esophageal reflux disease without esophagitis: Secondary | ICD-10-CM

## 2010-11-17 DIAGNOSIS — Z23 Encounter for immunization: Secondary | ICD-10-CM

## 2010-11-17 DIAGNOSIS — J984 Other disorders of lung: Secondary | ICD-10-CM

## 2010-11-17 DIAGNOSIS — M1990 Unspecified osteoarthritis, unspecified site: Secondary | ICD-10-CM

## 2010-11-17 DIAGNOSIS — M109 Gout, unspecified: Secondary | ICD-10-CM

## 2010-11-17 DIAGNOSIS — I10 Essential (primary) hypertension: Secondary | ICD-10-CM

## 2010-11-17 DIAGNOSIS — N401 Enlarged prostate with lower urinary tract symptoms: Secondary | ICD-10-CM

## 2010-11-17 DIAGNOSIS — I82409 Acute embolism and thrombosis of unspecified deep veins of unspecified lower extremity: Secondary | ICD-10-CM

## 2010-11-17 DIAGNOSIS — E7800 Pure hypercholesterolemia, unspecified: Secondary | ICD-10-CM

## 2010-11-17 LAB — HEMOGLOBIN A1C: Hgb A1c MFr Bld: 7.2 % — ABNORMAL HIGH (ref 4.6–6.5)

## 2010-11-17 NOTE — Patient Instructions (Signed)
Today we updated your med list in EPIC >>   Continue your current meds the same for now...  We reviewed your recent blood work & your Chol is not quite as good as prev> so before we increase or change your med we need to be sure you are on a good low cholesterol low fat diet!!!  We'll give you 6 months to get this back down...  Today we did your f/u CXR & A1c test>    Please call the PHONE TREE in a few days for your results...    Dial [id number] & when prompted enter your patient number followed by the # symbol...    Your patient number is:  [id number]  Keep up the good work w/ exercise & wt reduction!!!  Call for any questions...  Let's plan a nother recheck in 6 months.Marland KitchenMarland Kitchen

## 2010-11-17 NOTE — Progress Notes (Signed)
Subjective:    Patient ID: Hector Rose, male    DOB: Feb 07, 1941, 70 y.o.   MRN: [id number]  HPI 70 y/o BM here for a follow up visit... he has mult med prob as noted below...  ~  November 30, 2009:  he states he had a gout flair- occurred after stopping Allopurinol (?why) & placed on Probenecid by DrGolding- he wants to restart the Allopurinol gout preventive & I agree...  as before weight= the same & can't seem to lose any despite diet efforts> BP controlled on meds;  needs f/u fasting blood work to check Chol (FLP=ok) & A1c (BS129, A1c7.7)> he had decr his Glucovance on his own due to "low sugars" so we switched him to Metformin 500Bid & Glimep 1mg /d;  OK Flu shot today.  ~  May 13, 2010:  he hasn't had any gout or arthritic problems since getting back on the Allopurinol... he notes BP controlled on the Toprol/ Norvasc & he exercises on a treadmill for 15-20 min doing OK... he remains on coumadin followed by DrGolding in [place]... Lipids look good on diet + Simva40/ FishOil... but his DM control has not been adeq on current doses of Metform/ Glimep> BS at home runs up to 180 he says & labs today showed BS113, A1c=9.0.Marland KitchenMarland Kitchen we discussed diet & exercise & rec to double current meds> incr Metform500- 2Bid, and Glimep2mg Qam... his wt is unchanged at 232# & states his diet consists of "cutting back on sugar", & "not eating much bread"> we discussed this.  ~  November 17, 2010:  84mo ROV & doing well overall; he wants new DM meter & we provided one from the office today... He continues on Coumadin for his hx DVT w/ protimes managed by DrGolding in [place], stable on 5mg  tabs alternating 1 tab w/ 1.5 tabs qod... Known right base nodule= granuloma w/o change on serial CXRs; due for yearly f/u film today ==> clear, NAD... BP controlled on Metoprolol & Amlodipine; BP= 112/70 & he is asymptomatic w/o CP, palpit, HA, dizzy, SOB, edema, etc... Lipids controlled on Simva40 + diet & FishOil; FLPs have been  wnl in recent yrs but current lab showed LDL 123 despite 7# wt loss to 225# today... DM regulared w/ Metformin 500mg Bid & Glimep 2mg /d; Bs=120, A1c= 7.2; improved from previous... Hx DJD/ Gout & no prob since starting on the Allopurinol rx... See Prob list below >>   Current Problems:  PULMONARY NODULE (ICD-518.89) - vague nodular opacity right base over diaphragm on old films... ~  1.7 cm benign granuloma... no change on serial CXR or CT's back to 2000. ~  CXR 2/10 is WNL- nodule not seen... ~  CXR 9/10 showed tort Ao, Cor WNL, lungs clear w/o nodule identified. ~  CXR 9/11 showed clear lungs, NAD.Marland Kitchen. ~  CXR 9/12 showed clear lungs, NAD...  HYPERTENSION (ICD-401.9) - controlled on TOPROL 50mg Bid and NORVASC 10mg /d... BP's at home in the 120-130/70 range, and measures 112/70 today- he denies HA, fatigue, visual changes, CP, palipit, dizziness, syncope, dyspnea, edema, etc. ~  he had a neg Cardiolite study in 10/04- no ischemia and EF=52%. ~  Aug09: eval DrWall for atyp CP & risk factors> Myoview 11/01/07 showed no evid for infarct, +diaph attenuation, EF sl reduced at 47%.  DEEP VENOUS THROMBOPHLEBITIS (ICD-453.40) - Dx'd by LMD in [place] in 3/07... [facility] and had pain/ swelling L calf... ultrasound at [facility] + for DVT & Rx COUMADIN by DrGolding (  protimes q month)- no recent problems, he will check w/ DrGolding about discontinuing the Coumadin. ~  3/12:  Pt remains on coumadin 30yrs after his single episode unprovoked DVT left leg, followed by DrGolding...  HYPERCHOLESTEROLEMIA (ICD-272.0) - controlled on SIMVASTATIN 40mg /d & FISH OIL...  ~  [place] 9/08 showed TChol 148, TG 200, HDL 30, LDL 78... on Advicor 500-20 at that time- keep same. ~  [place] 3/09 showed TChol 214, TG 236, HDL 44, LDL 114... on Advicor- rec change to Simvastatin40. ~  FLP 7/09 showed TChol 129, Tg 188, HDL 35, LDL 57... rec- keep same. ~  FLP 1/10 showed TChol 142, TG 165, HDL 35,  LDL 74... stable on Simva40. ~  [place] 3/11 showed TChol 127, TG 147, HDL 34, LDL 64 ~  FLP 9/11 showed TChol 143, TG 151, HDL 36, LDL 77 ~  FLP 3/12 showed TChol 161, TG 103, HDL 43, LDL 98 ~  FLP 9/12 showed TChol 196, TG 119, HDL 49, LDL 123... Same med, better diet effort.  DM (ICD-250.00) - currently on METFORMIN 500mg Bid & GLIMEPIRIDE 2mg /d (NOTE: hx prev hypoglycemia from Glucovance). ~  labs 9/08 showed FBS=106 and HgA1C=6.5.Marland KitchenMarland Kitchen on Glucovance 1/2 Bid. ~  labs 3/09 showed BS= 99, HgA1c= 7.0.Marland KitchenMarland Kitchen on Glucovance 1/2 Qam only- rec to incr to Bid again. ~  labs 7/09 showed BS= 104, HgA1c= 6.8.Marland KitchenMarland Kitchen rec- keep same. ~  pt reports sugars too low ~50 at 11AM if he takes even 1/2 tab in AM, therefore he changed to 1/2 at dinner only... ~  labs 1/10 showed BS= 125, A1c= 7.0 ~  labs 9/10 showed BS= 102, A1c= 6.8 ~  labs 3/11 showed BS= 110, A1c= 7.0.Marland KitchenMarland Kitchen continue same meds, get wt down! ~  labs 9/11 showed BS= 129, A1c= 7.7.Marland KitchenMarland Kitchen rec change to METFORMIN ER 500mg  Qam & GLIMEPIRIDE 1mg  Qam. ~  labs 3/12 showed BS= 113, A1c= 9.0.Marland KitchenMarland Kitchen rec to double meds> Metform500Bid, Glim2mg AM. ~  Labs 9/12 showed BS= 120, A1c= 7.2.Marland KitchenMarland Kitchen Keep same + low carb diet.  GERD (ICD-530.81) - takes OMEPRAZOLE 20mg  Prn for reflux symptoms... ~  neg colonoscopy by DrStark in 6/00, and again 8/08 ( x sm hem's)... f/u planned 10 yrs.  BENIGN PROSTATIC HYPERTROPHY, WITH OBSTRUCTION (ICD-600.01) - he sees DrDavis yearly- pt reports "PSA was good" (we don't have notes from him)... S/P open prostatectomy 1999 by [id number]... Hx prostatitis in past... uses Cialis for ED. ~  labs 3/11 showed BUN= 10, Crear= 1.2... PSA done by Urology. ~  labs 9/11 showed BUN= 11, Creat= 1.3, PSA= 1.13 ~  he had f/u Urology DrBorden- BPH/ LUTS, PSA screening- "it was good" per pt. ~  Labs 9/12 showed PSA= 1.23  RENAL CALCULUS (ICD-592.0)  DEGENERATIVE JOINT DISEASE (ICD-715.90) - hx of pain on his right side and right elbow... prev Rx w/ Etodolac, Parafon, heat and  elbow pad... refer to ortho if symptoms persist... now he uses OTC anti-inflamm Rx Prn.  GOUT (ICD-274.9) - states he had episode right elbow arthritis and gout Rx'd by DrSypher 4/09 w/ Colchicine & Allopurinol 100mg /d... ~  labs 7/09 show Uric 6.1.Marland KitchenMarland Kitchen OK on Allopurinol 100mg /d... ~  labs 1/10 showed Uric= 6.5 ~  9/11:  pt reports that he stopped Allopurinol earlier this yr & had recent gout attack per DrGolding Rx w/ Probenecid... he would like to restart the Allopurinol preventive Rx & I agree> ALLOPURINOL 300mg /d written.  CARPAL TUNNEL SYNDROME (ICD-354.0) - treated by DrSypher over the years for CTS and mult trigger fingers (  w/ surg)...  KELOID (ICD-701.4)  Health Maintenance - pt had "bug bite" on leg 9/10 w/ Tetanus shot by DrGolding in [place]... prev PNEUMOVAX 2000 at age 66, therefore given f/u PNEUMOVAX in 2010 at age 23... he gets yearly flu vaccine each fall.   Past Surgical History  Procedure Date  . Prostatectomy 1999    Dr. Rosana Hoes  . Cosmetic surgery 2000  . Ortho surgery on fingers 2005 & 2008    Dr. Daylene Katayama    Outpatient Encounter Prescriptions as of 11/17/2010  Medication Sig Dispense Refill  . allopurinol (ZYLOPRIM) 300 MG tablet Take 300 mg by mouth daily.        Marland Kitchen amLODipine (NORVASC) 10 MG tablet TAKE 1 TABLET EVERY DAY  30 tablet  11  . fish oil-omega-3 fatty acids 1000 MG capsule Take 2 g by mouth daily.        Marland Kitchen glimepiride (AMARYL) 2 MG tablet Take 2 mg by mouth daily before breakfast.        . metFORMIN (GLUCOPHAGE) 500 MG tablet Take 500 mg by mouth 2 (two) times daily with a meal.       . metoprolol (LOPRESSOR) 50 MG tablet TAKE 1 TABLET BY MOUTH TWO TIMES A DAY  60 tablet  1  . Multiple Vitamins-Minerals (ONE-A-DAY MENS HEALTH FORMULA PO) Take 1 tablet by mouth daily.        Marland Kitchen omeprazole (PRILOSEC) 20 MG capsule Take 20 mg by mouth daily. As needed for stomach acid       . simvastatin (ZOCOR) 40 MG tablet TAKE 1 TABLET AT BEDTIME  30 tablet  1  .  tadalafil (CIALIS) 20 MG tablet Take 20 mg by mouth daily as needed.        . warfarin (COUMADIN) 7.5 MG tablet As directed by Dr. Hilma Favors       . DISCONTD: probenecid (BENEMID) 500 MG tablet As directed by Dr. Hilma Favors         Allergies  Allergen Reactions  . Ace Inhibitors     REACTION: angio edema    Current Medications, Allergies, Past Medical History, Past Surgical History, Family History, and Social History were reviewed in [employer] record.    Review of Systems        See HPI - all other systems neg except as noted...   The patient complains of dyspnea on exertion.  The patient denies anorexia, fever, weight loss, weight gain, vision loss, decreased hearing, hoarseness, chest pain, syncope, peripheral edema, prolonged cough, headaches, hemoptysis, abdominal pain, melena, hematochezia, severe indigestion/heartburn, hematuria, incontinence, muscle weakness, suspicious skin lesions, transient blindness, difficulty walking, depression, unusual weight change, abnormal bleeding, enlarged lymph nodes, and angioedema.     Objective:   Physical Exam     WD, WN, overwt 70 BM in NAD...  GENERAL:  Alert & oriented; pleasant & cooperative... HEENT:  [place]/AT, EOM-wnl, PERRLA, EACs-wax, TMs-wnl, NOSE-clear, THROAT-clear & wnl, no angioedema. NECK:  Supple w/ full ROM; no JVD; normal carotid impulses w/o bruits; no thyromegaly or nodules palpated; no lymphadenopathy. CHEST:  Clear to P & A; without wheezes/ rales/ or rhonchi. HEART:  Regular Rhythm; without murmurs/ rubs/ or gallops. ABDOMEN:  Soft & nontender; normal bowel sounds; no organomegaly or masses detected. RECTAL:  prostate 3+, no nodules, stool heme neg... EXT:  without deformities, mild arthritic changes; no varicose veins/ venous insuffic/ or edema. NEURO:  CN's intact; motor testing normal; sensory testing normal; gait normal & balance OK. DERM:  Keloids  noted...   Assessment & Plan:   Pulm Nodule>   Known 1.7cm benign granuloma right base above diaph seen on old CTs but not readily visible on plain films; todays yearly f/u CXR looks good, clear, NAD.  HBP>  Controlled on BBlocker, CCB; tol well, continue same...  Hx DVT>  DrGolding has apparently chosen to continue the Coumadin; hx single episode left leg DVT in 2007- Dx in [place] & followed there...  CHOL>  On Simva40 but this current FLP not as good despite 7# wt reduction to 225# today; we reviewed low chol, low fat diet, continue same med for now...  DM>  On Metformin Bid & Glimep2mg /d; A1c is improved & rec continue same but better diet 7 get wt down further...  GI> GERD> on PPI as needed; had colon 2008, neg & f/u due 2018...  GU> BPH w/ open prostatectomy 1999 by DrDavis;  PSA remains WNL; he has remote hx kidney stone...  DJD/ Gout/ Hx CTS>  Stable on OTC anti-inflamm as needed and Allopurinol daily...  Keloid>  Aware, no change.Marland KitchenMarland Kitchen

## 2010-12-09 LAB — DIFFERENTIAL
Basophils Absolute: 0
Eosinophils Relative: 3
Lymphocytes Relative: 32
Lymphs Abs: 2
Neutrophils Relative %: 56

## 2010-12-09 LAB — BASIC METABOLIC PANEL
BUN: 15
Calcium: 9
Creatinine, Ser: 1.32
GFR calc non Af Amer: 54 — ABNORMAL LOW
Glucose, Bld: 140 — ABNORMAL HIGH

## 2010-12-09 LAB — POCT CARDIAC MARKERS
CKMB, poc: 1
Operator id: 282261
Troponin i, poc: <0.05

## 2010-12-09 LAB — CBC
HCT: 41.8
Platelets: 155
RDW: 12.9
WBC: 6.2

## 2010-12-12 ENCOUNTER — Other Ambulatory Visit: Payer: Self-pay | Admitting: Pulmonary Disease

## 2010-12-19 LAB — BASIC METABOLIC PANEL
BUN: 10
Calcium: 9.3
Chloride: 102
Creatinine, Ser: 1.36
GFR calc Af Amer: >60
GFR calc non Af Amer: 53 — ABNORMAL LOW

## 2010-12-19 LAB — POCT HEMOGLOBIN-HEMACUE
Hemoglobin: 15.7
Operator id: 112821

## 2010-12-27 ENCOUNTER — Other Ambulatory Visit: Payer: Self-pay | Admitting: Pulmonary Disease

## 2010-12-27 ENCOUNTER — Telehealth: Payer: Self-pay | Admitting: Pulmonary Disease

## 2010-12-27 MED ORDER — GLIMEPIRIDE 2 MG PO TABS: 2.0000 mg | ORAL_TABLET | Freq: Every day | ORAL | Status: DC

## 2010-12-27 NOTE — Telephone Encounter (Signed)
Contacted pharmacy and verified dosage.

## 2011-01-02 ENCOUNTER — Telehealth: Payer: Self-pay | Admitting: Pulmonary Disease

## 2011-01-02 ENCOUNTER — Other Ambulatory Visit: Payer: Self-pay | Admitting: Pulmonary Disease

## 2011-01-02 NOTE — Telephone Encounter (Signed)
Spoke with pt and notified of recs per TP. Pt verbalized understanding and denied any further questions.

## 2011-01-02 NOTE — Telephone Encounter (Signed)
LMOM for pt TCB.  I reviewed pt's chart in [facility].  And per his physical with SN on 05/13/10, Sn rec'd to increase Glimepiride from 1mg  qam to 2mg  qam d/t increase in blood sugars.  However, his med list in Epic has both Glimepiride 1mg  and Glimepiride 2 mg listed.  SN, please advise.  Thanks.

## 2011-01-02 NOTE — Telephone Encounter (Signed)
According to his notes he thought he was taking amaryl 2mg  daily  If he is taking 1 mg that is fine stay on dose, and fix the MAR .  He will ov for follow up labs to make sure DM is ok as planned

## 2011-01-02 NOTE — Telephone Encounter (Signed)
ATC pt at home #. NA.  WCB

## 2011-01-07 ENCOUNTER — Other Ambulatory Visit: Payer: Self-pay | Admitting: Pulmonary Disease

## 2011-02-08 ENCOUNTER — Other Ambulatory Visit (HOSPITAL_COMMUNITY): Payer: Self-pay | Admitting: Physician Assistant

## 2011-02-08 ENCOUNTER — Telehealth: Payer: Self-pay | Admitting: Pulmonary Disease

## 2011-02-08 DIAGNOSIS — R109 Unspecified abdominal pain: Secondary | ICD-10-CM

## 2011-02-08 DIAGNOSIS — K219 Gastro-esophageal reflux disease without esophagitis: Secondary | ICD-10-CM

## 2011-02-08 NOTE — Telephone Encounter (Signed)
Pt stated that he is c/o sore joints, stiffness in his muscles.  He is wanting to know if he should cut the simvastatin 40mg  in half and see if this is better for him or should he cont with the simvastatin 40mg  daily.    SN please advise. thanks

## 2011-02-08 NOTE — Telephone Encounter (Signed)
Per SN---ok to decrease the simvastatin 40mg  to 1/2 tablet daily and keep [place] informed of how he is doing on the new dose.

## 2011-02-10 ENCOUNTER — Ambulatory Visit (HOSPITAL_COMMUNITY)
Admission: RE | Admit: 2011-02-10 | Discharge: 2011-02-10 | Disposition: A | Discharge status: Home / Self Care | Payer: Medicare Other | Source: Ambulatory Visit | Attending: Physician Assistant | Admitting: Physician Assistant

## 2011-02-10 DIAGNOSIS — K219 Gastro-esophageal reflux disease without esophagitis: Secondary | ICD-10-CM

## 2011-02-10 DIAGNOSIS — K7689 Other specified diseases of liver: Secondary | ICD-10-CM | POA: Insufficient documentation

## 2011-02-10 DIAGNOSIS — K838 Other specified diseases of biliary tract: Secondary | ICD-10-CM | POA: Insufficient documentation

## 2011-02-10 DIAGNOSIS — E119 Type 2 diabetes mellitus without complications: Secondary | ICD-10-CM | POA: Insufficient documentation

## 2011-02-10 DIAGNOSIS — N400 Enlarged prostate without lower urinary tract symptoms: Secondary | ICD-10-CM | POA: Insufficient documentation

## 2011-02-10 DIAGNOSIS — R109 Unspecified abdominal pain: Secondary | ICD-10-CM | POA: Insufficient documentation

## 2011-02-13 ENCOUNTER — Other Ambulatory Visit: Payer: Self-pay | Admitting: Pulmonary Disease

## 2011-03-30 ENCOUNTER — Other Ambulatory Visit: Payer: Self-pay | Admitting: Pulmonary Disease

## 2011-04-05 ENCOUNTER — Other Ambulatory Visit: Payer: Self-pay | Admitting: Urology

## 2011-04-07 ENCOUNTER — Other Ambulatory Visit (HOSPITAL_COMMUNITY): Payer: Medicare Other

## 2011-04-07 ENCOUNTER — Encounter (HOSPITAL_COMMUNITY): Payer: Self-pay

## 2011-04-11 ENCOUNTER — Encounter (HOSPITAL_COMMUNITY): Payer: Self-pay

## 2011-04-11 ENCOUNTER — Encounter (HOSPITAL_COMMUNITY)
Admission: RE | Admit: 2011-04-11 | Discharge: 2011-04-11 | Disposition: A | Discharge status: Home / Self Care | Payer: Medicare Other | Source: Ambulatory Visit | Attending: Urology | Admitting: Urology

## 2011-04-11 ENCOUNTER — Other Ambulatory Visit: Payer: Self-pay

## 2011-04-11 DIAGNOSIS — I82419 Acute embolism and thrombosis of unspecified femoral vein: Secondary | ICD-10-CM

## 2011-04-11 DIAGNOSIS — L910 Hypertrophic scar: Secondary | ICD-10-CM

## 2011-04-11 DIAGNOSIS — E119 Type 2 diabetes mellitus without complications: Secondary | ICD-10-CM

## 2011-04-11 DIAGNOSIS — K219 Gastro-esophageal reflux disease without esophagitis: Secondary | ICD-10-CM

## 2011-04-11 DIAGNOSIS — R911 Solitary pulmonary nodule: Secondary | ICD-10-CM

## 2011-04-11 HISTORY — DX: Type 2 diabetes mellitus without complications: E11.9

## 2011-04-11 HISTORY — DX: Acute embolism and thrombosis of unspecified femoral vein: I82.419

## 2011-04-11 HISTORY — DX: Gastro-esophageal reflux disease without esophagitis: K21.9

## 2011-04-11 HISTORY — DX: Hypertrophic scar: L91.0

## 2011-04-11 HISTORY — DX: Solitary pulmonary nodule: R91.1

## 2011-04-11 LAB — BASIC METABOLIC PANEL
BUN: 12 mg/dL (ref 6–23)
Calcium: 9.9 mg/dL (ref 8.4–10.5)
Chloride: 102 mEq/L (ref 96–112)
Creatinine, Ser: 1.17 mg/dL (ref 0.50–1.35)
GFR calc Af Amer: 72 mL/min — ABNORMAL LOW (ref >90)

## 2011-04-11 NOTE — Pre-Procedure Instructions (Signed)
04-11-11 -Dr. Winfred Leeds given status update on intubation difficulty with [phone] surgery-report with chart, will see AM of surgery.  EKG done today, CXR 9'12 report with chart.

## 2011-04-11 NOTE — Patient Instructions (Signed)
Hector Rose  04/11/2011   Your procedure is scheduled on:  04-14-11  Report to [facility] at 1:00 pm.  Call this number if you have problems the morning of surgery: [phone]   Remember:   Do not eat food:After Midnight.  May have clear liquids:up to 6 Hours before arrival. May have until 09:00 AM, then nothing.  Clear liquids include soda, tea, black coffee, apple or grape juice, broth.  Take these medicines the morning of surgery with A SIP OF WATER: Allopurinol, Metoprolol, Amlodipine, Omeprazole-if needed.   Do not wear jewelry, make-up or nail polish.  Do not wear lotions, powders, or perfumes. You may wear deodorant.  Do not shave 48 hours prior to surgery.  Do not bring valuables to the hospital.  Contacts, dentures or bridgework may not be worn into surgery.  Leave suitcase in the car. After surgery it may be brought to your room.  For patients admitted to the hospital, checkout time is 11:00 AM the day of discharge.   Patients discharged the day of surgery will not be allowed to drive home.  Name and phone number of your driver: spouse Hector [phone]  Special Instructions: CHG Shower Use Special Wash: 1/2 bottle night before surgery and 1/2 bottle morning of surgery.   Please read over the following fact sheets that you were given: MRSA Information

## 2011-04-13 NOTE — H&P (Signed)
History of Present Illness  [name] is a 71 year old previously followed by Dr. Rosana Rose with the following urologic history:  1) BPH/LUTS: He is s/p an open simple prostatectomy in 2002 by Dr. Rosana Rose.  2) Prostate cancer screening: Family history: None Last PSA: 1.13 (Sep 2012)  Interval history:  He follows up today after developing gross hematuria which began about 2 weeks ago.  He does take Coumadin for a history of DVT.  His hematuria was painless and lasted approximately 3 days and then has completely cleared. He has since restarted his Coumadin and has not noted any return of blood. He denies any associated symptoms such as dysuria or flank pain.   Past Medical History Problems  1. History of  Arthritis V13.4 2. History of  Diabetes Mellitus 250.00 3. History of  Esophageal Reflux 530.81 4. History of  Gout 274.9 5. History of  Hypercholesterolemia 272.0 6. History of  Hypertension 401.9 7. History of  Venous Thrombosis V12.51  Surgical History Problems  1. History of  Prostate Surgery  Current Meds 1. Allopurinol 300 MG Oral Tablet; Therapy: (Recorded:23Nov2009) to 2. AmLODIPine Besylate 10 MG Oral Tablet; Therapy: [id number] to 3. Aspirin 81 MG Oral Tablet; Therapy: (Recorded:18Nov2011) to 4. Daily Multiple Vitamins TABS; Therapy: (Recorded:23Nov2009) to 5. Glimepiride 1 MG Oral Tablet; Therapy: (Recorded:10Jan2013) to 6. GNP Fish Oil CAPS; Therapy: (Recorded:23Nov2009) to 7. Metoprolol Tartrate 50 MG Oral Tablet; Therapy: (Recorded:10Jan2013) to 8. Omeprazole 20 MG Oral Tablet Delayed Release; Therapy: (Recorded:10Jan2013) to 9. Simvastatin 20 MG Oral Tablet; Therapy: (Recorded:10Jan2013) to 10. Warfarin Sodium 5 MG Oral Tablet; Therapy: 28Jun2010 to  Allergies Medication  1. ACE Inhibitors  Family History Problems  1. Maternal history of  Cancer 2. Paternal history of  Cancer 3. Maternal history of  Diabetes Mellitus V18.0 4. Maternal history of   Hypertension  Social History Problems  1. Never A Smoker Denied  2. Alcohol Use 3. Tobacco Use V15.82  Physical Exam Constitutional: Well nourished and well developed . No acute distress.  Abdomen: No masses are palpated. No CVA tenderness.  Genitourinary: Examination of the penis demonstrates no lesions and a normal meatus.    Results/Data Urine [Data Includes: Last 1 Day]   [id number]  COLOR YELLOW   APPEARANCE CLEAR   SPECIFIC GRAVITY 1.020   pH 7.0   GLUCOSE NEG mg/dL  BILIRUBIN NEG   KETONE NEG mg/dL  BLOOD TRACE   PROTEIN NEG mg/dL  UROBILINOGEN 0.2 mg/dL  NITRITE NEG   LEUKOCYTE ESTERASE NEG   SQUAMOUS EPITHELIAL/HPF RARE   WBC NONE SEEN WBC/hpf  RBC 0-3 RBC/hpf  BACTERIA NONE SEEN   CRYSTALS NONE SEEN   CASTS NONE SEEN    Procedure  Procedure: Cystoscopy  Indication: Hematuria.  Informed Consent: Risks, benefits, and potential adverse events were discussed and informed consent was obtained from the patient.  Prep: The patient was prepped with betadine.  Anesthesia:. Local anesthesia was administered intraurethrally with 2% lidocaine jelly.  Antibiotic prophylaxis: Ciprofloxacin.  Procedure Note:  Urethral meatus:. No abnormalities.  Anterior urethra: No abnormalities.  Prostatic urethra:. The lateral prostatic lobes were enlarged. No intravesical median lobe was visualized.  Bladder: Visulization was clear. The ureteral orifices were in the normal anatomic position bilaterally and had clear efflux of urine. A systematic survey of the bladder demonstrated no bladder tumors or stones. The mucosa was smooth without abnormalities. A saline bladder washing was obtained and sent for cytologic analysis. The patient tolerated the procedure well.  Complications: None.  Assessment Assessed  1. Gross Hematuria 599.71  Plan Gross Hematuria (599.71)  1. AU CT-HEMATURIA PROTOCOL  Requested for: [id number] 2. CREATININE with eGFR  Requested for: [id number] 3. PVR  U/S  Requested for: [id number] 4. Follow-up Year x 1 Office  Follow-up  Requested for: [id number] Health Maintenance (V70.0)  5. UA With REFLEX  Done: [id number] 11:24AM  Discussion/Summary 1. BPH/LUTS: Since he will followup for his hematuria, I will plan to have a PVR checked at his next visit.  2. Gross hematuria: He has no cystoscopic explanation for his hematuria and a bladder washing has been obtained for cytology. His renal function will be checked today he will undergo CT imaging of his upper urinary tract to exclude any concerning cause. He'll notify me should he develop any recurrent hematuria and will otherwise plan to followup in one year for further evaluation. If he does not have any recurrence and does not have any concerning findings on the remainder of his evaluation, the most likely explanation for his hematuria would be his BPH with hematuria likely exacerbated by his anticoagulation.  CC: Dr. Teressa Rose   Amendment  He will need to be bridged with Lovenox. This will be arranged prior to surgery. I also plan to admit him overnight considering he will need to restart Lovenox after his biopsies.  Amended By: Hector Rose; 04/04/2011 3:10 PMEST   Verified Results AU CT-HEMATURIA PROTOCOL2 [id number] 12:00AM2 Hector Rose  [Apr 06, 2011 3:20PM Hector Rose] Please let patient know that CT does not show any obvious explanation for blood in urine or positive cytology. He should proceed with planned surgery next week as scheduled.   Test Name Result Flag Reference  ** RADIOLOGY REPORT BY Hector Rose ** ORIGINAL APPROVED BY: Hector Rose, M.D. ON: 04/06/2011 12:19:44   *RADIOLOGY REPORT*  Clinical Data: Micro hematuria.  CT ABDOMEN AND PELVIS WITHOUT AND WITH CONTRAST  Technique: Multidetector CT imaging of the abdomen and pelvis was performed without contrast material in one or both body regions, followed by contrast material(s) and further sections  in one or both body regions.  Contrast: 125 ml Isovue 300  Comparison: CT scan from 2009 an MRI examination 2010.  Findings: The unenhanced appearance of the kidneys demonstrate no renal calculi. No ureteral or bladder calculi.  The lung bases are clear. Stable calcified pleural plaques are noted consistent with asbestos related pleural disease.  The liver is unremarkable. No focal lesions or biliary dilatation to the gallbladder is normal. No common bile duct dilatation. The pancreas is normal. Mild areas of fatty change are again demonstrated. The spleen is normal in size. No focal lesions. Adrenal glands are unremarkable and stable.  There are stable bilateral renal cyst. The upper pole cyst laterally is hemorrhagic/hyperdense. No worrisome renal mass lesions and no collecting system abnormality on the delayed images to account for the patient's hematuria. Both ureters are normal. No bladder mass. Mild median lobe hypertrophy of the prostate gland impressing on the base of the bladder. The prostate gland is mildly enlarged. The seminal vesicles are normal.  The aorta is normal in caliber. The major branch vessels are patent. No atherosclerotic changes or dissection. No mesenteric or retroperitoneal masses or lymphadenopathy.  The stomach, duodenum, small bowel and colon are grossly normal without oral contrast. Scattered diverticulosis of the colon is noted.  No inguinal mass or hernia. Surgical changes are noted on the left.  The bony structures are unremarkable. Stable degenerative changes involving the spine, SI joints  and hips.  IMPRESSION:  1. Stable bilateral renal cysts. No renal mass lesions, collecting system abnormalities, renal, ureteral or bladder calculi to account for the patient's hematuria. 2. Mild prostate gland enlargement with median lobe hypertrophy and impression on the bladder. 3. Stable calcified pleural plaques consistent with  asbestos related pleural disease.   URINE CYTOLOGY W/REFLEX FISH1 [id number] [employer] D[employer]  [Mar 31, 2011 10:09PM Jaiveon Suppes] Patient informed of result. He will proceed with CT scan and will be scheduled for cystoscopy/ biopsies of bladder and prostatic urethra/ retrograde pyelography and possible ureteroscopy and stent placement. We have discussed the potential risks, complications, and alternative options and gives informed consent to proceed.   Test Name Result Flag Reference  FINAL DIAGNOSIS:1  A1   - POSITIVE FOR MALIGNANCY. - UROTHELIAL CARCINOMA. There has been an intradepartmental review and the consulted pathologist concurs.  1 Container Submitted  SOURCE:1 Bladder Washing1    2 VIALS RCVD 60CC OF LIGHT YELLOW BLW 60CC OF LIGHT YELLOW BLW 1 SLIDE PREPARED [id number] LW  Relevant Clinical Info1 GROSS HEMATURIA1    CYTOTECHNOLOGIST:1     Lavaura Adron Bene, BS,MS CT(ASCP)  PATHOLOGIST:1     Reviewed by Zackery Barefoot. Charlyn Minerva, M.D. (Electronic Signature on File)     1. Amended By: Hector Rose; 03/31/2011 10:09 PMEST  2. Amended By: Hector Rose; 04/06/2011 3:20 PMEST  Signatures Electronically signed by : Hector Rose, M.D.; Apr 06 2011  3:20PM

## 2011-04-14 ENCOUNTER — Encounter (HOSPITAL_COMMUNITY)
Admission: RE | Disposition: A | Discharge status: Home / Self Care | Payer: Self-pay | Source: Ambulatory Visit | Attending: Urology

## 2011-04-14 ENCOUNTER — Ambulatory Visit (HOSPITAL_COMMUNITY): Payer: Medicare Other | Admitting: Anesthesiology

## 2011-04-14 ENCOUNTER — Encounter (HOSPITAL_COMMUNITY): Payer: Self-pay | Admitting: *Deleted

## 2011-04-14 ENCOUNTER — Ambulatory Visit (HOSPITAL_COMMUNITY)
Admission: RE | Admit: 2011-04-14 | Discharge: 2011-04-15 | Disposition: A | Discharge status: Home / Self Care | Payer: Medicare Other | Source: Ambulatory Visit | Attending: Urology | Admitting: Urology

## 2011-04-14 ENCOUNTER — Other Ambulatory Visit: Payer: Self-pay | Admitting: Urology

## 2011-04-14 ENCOUNTER — Encounter (HOSPITAL_COMMUNITY): Payer: Self-pay | Admitting: Anesthesiology

## 2011-04-14 DIAGNOSIS — I10 Essential (primary) hypertension: Secondary | ICD-10-CM | POA: Insufficient documentation

## 2011-04-14 DIAGNOSIS — Z7901 Long term (current) use of anticoagulants: Secondary | ICD-10-CM | POA: Insufficient documentation

## 2011-04-14 DIAGNOSIS — Z86718 Personal history of other venous thrombosis and embolism: Secondary | ICD-10-CM | POA: Insufficient documentation

## 2011-04-14 DIAGNOSIS — E7800 Pure hypercholesterolemia, unspecified: Secondary | ICD-10-CM | POA: Insufficient documentation

## 2011-04-14 DIAGNOSIS — Z7982 Long term (current) use of aspirin: Secondary | ICD-10-CM | POA: Insufficient documentation

## 2011-04-14 DIAGNOSIS — K219 Gastro-esophageal reflux disease without esophagitis: Secondary | ICD-10-CM | POA: Insufficient documentation

## 2011-04-14 DIAGNOSIS — Z79899 Other long term (current) drug therapy: Secondary | ICD-10-CM | POA: Insufficient documentation

## 2011-04-14 DIAGNOSIS — R310 Gross hematuria: Secondary | ICD-10-CM | POA: Insufficient documentation

## 2011-04-14 DIAGNOSIS — E119 Type 2 diabetes mellitus without complications: Secondary | ICD-10-CM | POA: Insufficient documentation

## 2011-04-14 DIAGNOSIS — Z01812 Encounter for preprocedural laboratory examination: Secondary | ICD-10-CM | POA: Insufficient documentation

## 2011-04-14 DIAGNOSIS — Z01810 Encounter for preprocedural cardiovascular examination: Secondary | ICD-10-CM | POA: Insufficient documentation

## 2011-04-14 HISTORY — PX: CYSTOSCOPY WITH BIOPSY: SHX5122

## 2011-04-14 HISTORY — PX: CYSTOSCOPY/RETROGRADE/URETEROSCOPY: SHX5316

## 2011-04-14 LAB — PROTIME-INR: INR: 1 (ref 0.00–1.49)

## 2011-04-14 LAB — GLUCOSE, CAPILLARY: Glucose-Capillary: 104 mg/dL — ABNORMAL HIGH (ref 70–99)

## 2011-04-14 SURGERY — CYSTOSCOPY, WITH BIOPSY
Anesthesia: General

## 2011-04-14 MED ORDER — SODIUM CHLORIDE 0.9 % IJ SOLN: 3.0000 mL | Freq: Two times a day (BID) | INTRAMUSCULAR | Status: DC

## 2011-04-14 MED ORDER — STERILE WATER FOR IRRIGATION IR SOLN
Status: DC | PRN
  Administered 2011-04-14: 3000 mL

## 2011-04-14 MED ORDER — BELLADONNA ALKALOIDS-OPIUM 16.2-60 MG RE SUPP
RECTAL | Status: AC
  Filled 2011-04-14: qty 1

## 2011-04-14 MED ORDER — DOCUSATE SODIUM 100 MG PO CAPS
100.0000 mg | ORAL_CAPSULE | Freq: Two times a day (BID) | ORAL | Status: DC
  Administered 2011-04-14 – 2011-04-15 (×2): 100 mg via ORAL
  Filled 2011-04-14 (×3): qty 1

## 2011-04-14 MED ORDER — CIPROFLOXACIN IN D5W 400 MG/200ML IV SOLN
400.0000 mg | INTRAVENOUS | Status: AC
  Administered 2011-04-14: 400 mg via INTRAVENOUS

## 2011-04-14 MED ORDER — ONDANSETRON HCL 4 MG/2ML IJ SOLN
INTRAMUSCULAR | Status: DC | PRN
  Administered 2011-04-14: 4 mg via INTRAVENOUS

## 2011-04-14 MED ORDER — HYDROCODONE-ACETAMINOPHEN 5-325 MG PO TABS
1.0000 | ORAL_TABLET | ORAL | Status: DC | PRN
  Administered 2011-04-14: 2 via ORAL
  Administered 2011-04-15: 1 via ORAL
  Administered 2011-04-15 (×2): 2 via ORAL
  Filled 2011-04-14 (×4): qty 2

## 2011-04-14 MED ORDER — INSULIN ASPART 100 UNIT/ML ~~LOC~~ SOLN
0.0000 [IU] | SUBCUTANEOUS | Status: DC
  Administered 2011-04-15: 5 [IU] via SUBCUTANEOUS
  Administered 2011-04-15: 2 [IU] via SUBCUTANEOUS
  Administered 2011-04-15: 3 [IU] via SUBCUTANEOUS
  Filled 2011-04-14: qty 3

## 2011-04-14 MED ORDER — LACTATED RINGERS IV SOLN
INTRAVENOUS | Status: DC
  Administered 2011-04-14: 1000 mL via INTRAVENOUS

## 2011-04-14 MED ORDER — SODIUM CHLORIDE 0.45 % IV SOLN
INTRAVENOUS | Status: DC
  Administered 2011-04-14: 1000 mL via INTRAVENOUS
  Administered 2011-04-15: 75 mL/h via INTRAVENOUS

## 2011-04-14 MED ORDER — SODIUM CHLORIDE 0.9 % IV SOLN: 250.0000 mL | INTRAVENOUS | Status: DC

## 2011-04-14 MED ORDER — SIMVASTATIN 40 MG PO TABS
40.0000 mg | ORAL_TABLET | Freq: Every day | ORAL | Status: DC
Start: 2011-04-14 — End: 2011-04-14

## 2011-04-14 MED ORDER — GLYCINE 1.5 % IR SOLN
Status: DC | PRN
  Administered 2011-04-14: 3000 mL

## 2011-04-14 MED ORDER — ALLOPURINOL 300 MG PO TABS
300.0000 mg | ORAL_TABLET | Freq: Every day | ORAL | Status: DC
Start: 2011-04-14 — End: 2011-04-15
  Administered 2011-04-14 – 2011-04-15 (×2): 300 mg via ORAL
  Filled 2011-04-14 (×2): qty 1

## 2011-04-14 MED ORDER — FENTANYL CITRATE 0.05 MG/ML IJ SOLN
INTRAMUSCULAR | Status: AC
  Filled 2011-04-14: qty 2

## 2011-04-14 MED ORDER — IOHEXOL 300 MG/ML  SOLN
INTRAMUSCULAR | Status: AC
  Filled 2011-04-14: qty 1

## 2011-04-14 MED ORDER — DEXAMETHASONE SODIUM PHOSPHATE 10 MG/ML IJ SOLN
INTRAMUSCULAR | Status: DC | PRN
  Administered 2011-04-14: 10 mg via INTRAVENOUS

## 2011-04-14 MED ORDER — DIPHENHYDRAMINE HCL 12.5 MG/5ML PO ELIX: 12.5000 mg | ORAL_SOLUTION | Freq: Four times a day (QID) | ORAL | Status: DC | PRN

## 2011-04-14 MED ORDER — BELLADONNA ALKALOIDS-OPIUM 16.2-60 MG RE SUPP
1.0000 | Freq: Three times a day (TID) | RECTAL | Status: DC | PRN
  Administered 2011-04-14: 1 via RECTAL

## 2011-04-14 MED ORDER — FENTANYL CITRATE 0.05 MG/ML IJ SOLN
INTRAMUSCULAR | Status: DC | PRN
  Administered 2011-04-14 (×2): 50 ug via INTRAVENOUS

## 2011-04-14 MED ORDER — PROMETHAZINE HCL 25 MG/ML IJ SOLN: 6.2500 mg | INTRAMUSCULAR | Status: DC | PRN

## 2011-04-14 MED ORDER — DOCUSATE SODIUM 100 MG PO CAPS: 100.0000 mg | ORAL_CAPSULE | Freq: Two times a day (BID) | ORAL | Status: AC

## 2011-04-14 MED ORDER — METOPROLOL TARTRATE 50 MG PO TABS
50.0000 mg | ORAL_TABLET | Freq: Two times a day (BID) | ORAL | Status: DC
  Administered 2011-04-14 – 2011-04-15 (×2): 50 mg via ORAL
  Filled 2011-04-14 (×3): qty 1

## 2011-04-14 MED ORDER — HYDROCODONE-ACETAMINOPHEN 5-500 MG PO CAPS: 1.0000 | ORAL_CAPSULE | Freq: Four times a day (QID) | ORAL | Status: AC | PRN

## 2011-04-14 MED ORDER — AMLODIPINE BESYLATE 10 MG PO TABS
10.0000 mg | ORAL_TABLET | Freq: Every day | ORAL | Status: DC
  Administered 2011-04-14 – 2011-04-15 (×2): 10 mg via ORAL
  Filled 2011-04-14 (×2): qty 1

## 2011-04-14 MED ORDER — SODIUM CHLORIDE 0.9 % IJ SOLN: 3.0000 mL | INTRAMUSCULAR | Status: DC | PRN

## 2011-04-14 MED ORDER — LIDOCAINE HCL (CARDIAC) 20 MG/ML IV SOLN
INTRAVENOUS | Status: DC | PRN
  Administered 2011-04-14: 50 mg via INTRAVENOUS

## 2011-04-14 MED ORDER — ZOLPIDEM TARTRATE 5 MG PO TABS: 5.0000 mg | ORAL_TABLET | Freq: Every evening | ORAL | Status: DC | PRN

## 2011-04-14 MED ORDER — FENTANYL CITRATE 0.05 MG/ML IJ SOLN: 50.0000 ug | INTRAMUSCULAR | Status: DC | PRN

## 2011-04-14 MED ORDER — DIPHENHYDRAMINE HCL 50 MG/ML IJ SOLN: 12.5000 mg | Freq: Four times a day (QID) | INTRAMUSCULAR | Status: DC | PRN

## 2011-04-14 MED ORDER — PANTOPRAZOLE SODIUM 40 MG PO TBEC
40.0000 mg | DELAYED_RELEASE_TABLET | Freq: Every day | ORAL | Status: DC
  Administered 2011-04-15: 40 mg via ORAL
  Filled 2011-04-14: qty 1

## 2011-04-14 MED ORDER — CIPROFLOXACIN IN D5W 400 MG/200ML IV SOLN
400.0000 mg | Freq: Two times a day (BID) | INTRAVENOUS | Status: DC
  Administered 2011-04-15: 400 mg via INTRAVENOUS
  Filled 2011-04-14 (×2): qty 200

## 2011-04-14 MED ORDER — FENTANYL CITRATE 0.05 MG/ML IJ SOLN
25.0000 ug | INTRAMUSCULAR | Status: DC | PRN
  Administered 2011-04-14: 50 ug via INTRAVENOUS
  Administered 2011-04-14 (×2): 25 ug via INTRAVENOUS
  Administered 2011-04-14: 50 ug via INTRAVENOUS

## 2011-04-14 MED ORDER — PROPOFOL 10 MG/ML IV BOLUS
INTRAVENOUS | Status: DC | PRN
  Administered 2011-04-14: 180 mg via INTRAVENOUS

## 2011-04-14 MED ORDER — ENOXAPARIN SODIUM 30 MG/0.3ML ~~LOC~~ SOLN
30.0000 mg | Freq: Two times a day (BID) | SUBCUTANEOUS | Status: DC
  Administered 2011-04-15: 30 mg via SUBCUTANEOUS
  Filled 2011-04-14 (×2): qty 0.3

## 2011-04-14 MED ORDER — ROSUVASTATIN CALCIUM 10 MG PO TABS
10.0000 mg | ORAL_TABLET | Freq: Every day | ORAL | Status: DC
  Administered 2011-04-14: 10 mg via ORAL
  Filled 2011-04-14 (×2): qty 1

## 2011-04-14 MED ORDER — MIDAZOLAM HCL 5 MG/5ML IJ SOLN
INTRAMUSCULAR | Status: DC | PRN
  Administered 2011-04-14: 2 mg via INTRAVENOUS

## 2011-04-14 MED ORDER — IOHEXOL 300 MG/ML  SOLN
INTRAMUSCULAR | Status: DC | PRN
  Administered 2011-04-14: 10 mL via INTRAVENOUS

## 2011-04-14 MED ORDER — LACTATED RINGERS IV SOLN: INTRAVENOUS | Status: DC

## 2011-04-14 SURGICAL SUPPLY — 29 items
ADAPTER CATH URET PLST 4-6FR (CATHETERS) ×3 IMPLANT
ADPR CATH URET STRL DISP 4-6FR (CATHETERS) ×2
BAG URINE DRAINAGE (UROLOGICAL SUPPLIES) ×1 IMPLANT
BAG URO CATCHER STRL LF (DRAPE) ×3 IMPLANT
BASKET ZERO TIP NITINOL 2.4FR (BASKET) IMPLANT
BSKT STON RTRVL ZERO TP 2.4FR (BASKET)
CATH HEMA 3WAY 30CC 22FR COUDE (CATHETERS) ×1 IMPLANT
CATH INTERMIT  6FR 70CM (CATHETERS) ×1 IMPLANT
CATH ROBINSON RED A/P 16FR (CATHETERS) IMPLANT
CATH URET 5FR 28IN CONE TIP (BALLOONS) ×1
CATH URET 5FR 70CM CONE TIP (BALLOONS) IMPLANT
CLOTH BEACON ORANGE TIMEOUT ST (SAFETY) ×3 IMPLANT
DRAPE CAMERA CLOSED 9X96 (DRAPES) ×3 IMPLANT
ELECT REM PT RETURN 9FT ADLT (ELECTROSURGICAL) ×3
ELECTRODE REM PT RTRN 9FT ADLT (ELECTROSURGICAL) ×2 IMPLANT
GLOVE BIOGEL M STRL SZ7.5 (GLOVE) ×3 IMPLANT
GOWN PREVENTION PLUS XLARGE (GOWN DISPOSABLE) ×3 IMPLANT
GOWN STRL NON-REIN LRG LVL3 (GOWN DISPOSABLE) ×6 IMPLANT
GUIDEWIRE ANG ZIPWIRE 038X150 (WIRE) IMPLANT
GUIDEWIRE STR DUAL SENSOR (WIRE) ×3 IMPLANT
LOOPS RESECTOSCOPE DISP (ELECTROSURGICAL) ×1 IMPLANT
MANIFOLD NEPTUNE II (INSTRUMENTS) ×3 IMPLANT
NDL SAFETY ECLIPSE 18X1.5 (NEEDLE) IMPLANT
NEEDLE HYPO 18GX1.5 SHARP (NEEDLE)
NEEDLE HYPO 22GX1.5 SAFETY (NEEDLE) IMPLANT
PACK CYSTO (CUSTOM PROCEDURE TRAY) ×3 IMPLANT
PLUG CATH AND CAP STER (CATHETERS) ×1 IMPLANT
TUBING CONNECTING 10 (TUBING) ×3 IMPLANT
WATER STERILE IRR 3000ML UROMA (IV SOLUTION) ×2 IMPLANT

## 2011-04-14 NOTE — Progress Notes (Signed)
The order for simvastatin(Zocor) was changed to an equivalent dose of rosuvastatin(Crestor) due to the potential drug interaction with amlodipine(Norvasc).  When taken in combination with medications that inhibit its metabolism, simvastatin can accumulate which increases the risk of liver toxicity, myopathy, or rhabdomyolysis.  Simvastatin dose should not exceed 10mg /day in patients taking verapamil, diltiazem, fibrates, or niacin >or= 1g/day.   Simvastatin dose should not exceed 20mg /day in patients taking amlodipine, ranolazine or amiodarone.   Please consider this potential interaction at discharge.  Lolita Patella 04/14/2011 5:10 PM

## 2011-04-14 NOTE — Anesthesia Postprocedure Evaluation (Signed)
  Anesthesia Post-op Note  Patient: Hector Rose  Procedure(s) Performed:  CYSTOSCOPY WITH BIOPSY - Bladder Biopsies Biopsy of Prostatic Urethra  ; CYSTOSCOPY/RETROGRADE/URETEROSCOPY - (Bil) RPG   Patient Location: PACU  Anesthesia Type: General  Level of Consciousness: awake and alert   Airway and Oxygen Therapy: Patient Spontanous Breathing  Post-op Pain: mild  Post-op Assessment: Post-op Vital signs reviewed, Patient's Cardiovascular Status Stable, Respiratory Function Stable, Patent Airway and No signs of Nausea or vomiting  Post-op Vital Signs: stable  Complications: No apparent anesthesia complications

## 2011-04-14 NOTE — Interval H&P Note (Signed)
History and Physical Interval Note:  04/14/2011 2:27 PM  Hector Rose  has presented today for surgery, with the diagnosis of Hematuria  The various methods of treatment have been discussed with the patient and family. After consideration of risks, benefits and other options for treatment, the patient has consented to  Procedure(s): CYSTOSCOPY WITH BIOPSY CYSTOSCOPY/RETROGRADE/URETEROSCOPY as a surgical intervention .  The patients' history has been reviewed, patient examined, no change in status, stable for surgery.  I have reviewed the patients' chart and labs.  Questions were answered to the patient's satisfaction.     Greta Yung,LES

## 2011-04-14 NOTE — Anesthesia Preprocedure Evaluation (Addendum)
Anesthesia Evaluation  Patient identified by MRN, date of birth, ID band Patient awake    Reviewed: Allergy & Precautions, H&P , NPO status , Patient's Chart, lab work & pertinent test results, reviewed documented beta blocker date and time   History of Anesthesia Complications (+) DIFFICULT AIRWAY  Airway Mallampati: III TM Distance: >3 FB Neck ROM: full    Dental  (+) Caps and Dental Advisory Given,    Pulmonary neg pulmonary ROS, asthma ,  clear to auscultation  Pulmonary exam normal       Cardiovascular Exercise Tolerance: Good hypertension, On Home Beta Blockers neg cardio ROS regular Normal    Neuro/Psych Negative Neurological ROS  Negative Psych ROS   GI/Hepatic negative GI ROS, Neg liver ROS, GERD-  Controlled and Medicated,  Endo/Other  Negative Endocrine ROSDiabetes mellitus-, Well Controlled, Oral Hypoglycemic Agents  Renal/GU negative Renal ROS  Genitourinary negative   Musculoskeletal   Abdominal   Peds  Hematology negative hematology ROS (+)   Anesthesia Other Findings   Reproductive/Obstetrics negative OB ROS                          Anesthesia Physical Anesthesia Plan  ASA: III  Anesthesia Plan: General   Post-op Pain Management:    Induction: Intravenous  Airway Management Planned: LMA  Additional Equipment:   Intra-op Plan:   Post-operative Plan:   Informed Consent: I have reviewed the patients History and Physical, chart, labs and discussed the procedure including the risks, benefits and alternatives for the proposed anesthesia with the patient or authorized representative who has indicated his/her understanding and acceptance.   Dental Advisory Given  Plan Discussed with: CRNA and Surgeon  Anesthesia Plan Comments:        Anesthesia Quick Evaluation

## 2011-04-14 NOTE — Transfer of Care (Signed)
Immediate Anesthesia Transfer of Care Note  Patient: Hector Rose  Procedure(s) Performed:  CYSTOSCOPY WITH BIOPSY - Bladder Biopsies Biopsy of Prostatic Urethra  ; CYSTOSCOPY/RETROGRADE/URETEROSCOPY - (Bil) RPG   Patient Location: PACU  Anesthesia Type: General  Level of Consciousness: sedated  Airway & Oxygen Therapy: Patient Spontanous Breathing and Patient connected to face mask oxygen  Post-op Assessment: Report given to PACU RN and Post -op Vital signs reviewed and stable  Post vital signs: Reviewed and stable  Complications: No apparent anesthesia complications

## 2011-04-14 NOTE — Op Note (Signed)
Preoperative diagnosis: 1. Hematuria 2. Positive urine cytology  Postoperative diagnosis:  1. Hematuria 2. Positive urine cytology  Procedure:  1. Cystoscopy 2. Site selected bladder biopsies  3. Biopsy of prostatic urethra 4. Bilateral retrograde pyelography with interpretation  Surgeon: [name]. M.D.  Anesthesia: General  Complications: None  Intraoperative findings:  1. Frodular projections of the mucosa of the prostatic urethra was identified which was suspicious for urothelial carcinoma. 2. Retrograde pyelography: The left and right renal collecting systems and ureters were normal without filling defects or other abnormalities.  EBL: Minimal  Specimens: 1. Right bladder biopsy 2. Left bladder biopsy 3. Posterior bladder biopsy 4. Prostatic urethra biopsies  Disposition of specimens: Pathology  Indication: Hector Rose is a patient who was found to have hematuria and a positive urine cytology. After reviewing the management options for treatment, he elected to proceed with the above surgical procedure(s). We have discussed the potential benefits and risks of the procedure, side effects of the proposed treatment, the likelihood of the patient achieving the goals of the procedure, and any potential problems that might occur during the procedure or recuperation. Informed consent has been obtained.  Description of procedure:  The patient was taken to the operating room and general anesthesia was induced.  The patient was placed in the dorsal lithotomy position, prepped and draped in the usual sterile fashion, and preoperative antibiotics were administered. A preoperative time-out was performed.   Cystourethroscopy was performed.  The patient's urethra was examined and there was a defect in his prostatic urethra consistent with his prior suprapubic prostatectomy.  He also was noted to have regrowth of his left lateral lobe with frondular projections off the left  lobe and the posterior aspect near the verumontanum   The bladder was then systematically examined in its entirety. There were no mucosal abnormalities, bladder tumors, or stones. His ureteral orifices were noted in the normal anatomic positions.  Attention then turned to the right ureteral orifice and a ureteral catheter was used to intubate the ureteral orifice.  Omnipaque contrast was injected through the ureteral catheter and a retrograde pyelogram was performed with findings as dictated above.  Attention then turned to the left ureteral orifice and a ureteral catheter was used to intubate the ureteral orifice.  Omnipaque contrast was injected through the ureteral catheter and a retrograde pyelogram was performed with findings as dictated above.  No abnormalities of the upper tracts were identified.  Attention then turned to the bladder where site selected biopsies were taken from the right, left, and posterior bladder. The bugbee electrode was used to cauterize these areas and provide hemostasis.  The resectoscope was then inserted and biopsies of the prostatic urethra were taken with the abnormal mucosal surfaces completely resected.   Hemostasis was then achieved with the loop cautery and the bladder was emptied and reinspected with no further bleeding noted at the end of the procedure.  A 22 Fr hematuria catheter was placed.  The bladder was then emptied and the procedure ended.  The patient appeared to tolerate the procedure well and without complications.  The patient was able to be awakened and transferred to the recovery unit in satisfactory condition.    [name] MD

## 2011-04-15 LAB — GLUCOSE, CAPILLARY
Glucose-Capillary: 169 mg/dL — ABNORMAL HIGH (ref 70–99)
Glucose-Capillary: 213 mg/dL — ABNORMAL HIGH (ref 70–99)

## 2011-04-15 NOTE — Discharge Summary (Signed)
Physician Discharge Summary  Patient ID: Hector Rose MRN: [id number] DOB/AGE: Aug 24, 1940 71 y.o.  Admit date: 04/14/2011 Discharge date: 04/15/2011  Admission Diagnoses: Gross hematuria, abnormal cytology  Discharge Diagnoses: same Active Problems:  * No active hospital problems. *    Discharged Condition: good  Hospital Course:  Pt underwent endoscopic evaluation with cysto, retrogrades, bladder / urethral biopsies on the day of admission without acute complications for continued w/u of gross hematuria and abnormal cytology in setting of normal CT urogram and office cysto.  Post-op day one the patient passed trial of void, was ambulating with ease, and had pain controlled with PO meds and felt to be adequate for discharge.  Pt undergoing Lovenox bridge for h/o DVT which he is facile in administering.  Consults: none  Significant Diagnostic Studies:  INR 04/14/11 - 1.0 pre-op Retrograde Pyelograms - no filling defects  Treatments: surgery, analgesia  Discharge Exam: Blood pressure 119/69, pulse 62, temperature 97.4 F (36.3 C), temperature source Oral, resp. rate 18, height 6\' 1"  (1.854 m), weight 102.1 kg (225 lb 1.4 oz), SpO2 98.00%. General appearance: alert, cooperative and appears stated age Head: Normocephalic, without obvious abnormality, atraumatic Resp: clear to auscultation bilaterally Cardio: regular rate and rhythm, S1, S2 normal, no murmur, click, rub or gallop GI: soft, non-tender; bowel sounds normal; no masses,  no organomegaly Male genitalia: normal, penis: no lesions or discharge. testes: no masses or tenderness. no hernias Extremities: extremities normal, atraumatic, no cyanosis or edema Neurologic: Alert and oriented X 3, normal strength and tone. Normal symmetric reflexes. Normal coordination and gait  Disposition:   Discharge Orders    Future Appointments: Provider: Department: Dept Phone: Center:   05/19/2011 9:30 AM Noralee Space, MD [facility]  [phone] None     Medication List  As of 04/15/2011  7:50 AM   STOP taking these medications         aspirin EC 81 MG tablet      fish oil-omega-3 fatty acids 1000 MG capsule         TAKE these medications         allopurinol 300 MG tablet   Commonly known as: ZYLOPRIM   TAKE 1 TABLET BY MOUTH ONCE DAILY      amLODipine 10 MG tablet   Commonly known as: NORVASC   Takes daily am      azithromycin 250 MG tablet   Commonly known as: ZITHROMAX   Take 250 mg by mouth daily.      docusate sodium 100 MG capsule   Commonly known as: COLACE   Take 1 capsule (100 mg total) by mouth 2 (two) times daily.      enoxaparin 30 MG/0.3ML Soln   Commonly known as: LOVENOX   Inject 30 mg into the skin every 12 (twelve) hours.      glimepiride 1 MG tablet   Commonly known as: AMARYL   TAKE 1 TABLET EVERY MORNING      hydrocodone-acetaminophen 5-500 MG per capsule   Commonly known as: LORCET-HD   Take 1 capsule by mouth every 6 (six) hours as needed for pain.      metFORMIN 500 MG tablet   Commonly known as: GLUCOPHAGE   TAKE 1 TABLET BY MOUTH TWO TIMES A DAY      metoprolol 50 MG tablet   Commonly known as: LOPRESSOR      mulitivitamin with minerals Tabs   Take 1 tablet by mouth daily. Mens One a Day.  omeprazole 20 MG capsule   Commonly known as: PRILOSEC   Take 20 mg by mouth daily as needed. As needed for stomach acid      polyvinyl alcohol 1.4 % ophthalmic solution   Commonly known as: LIQUIFILM TEARS   Place 1 drop into both eyes as needed. For dry eyes.      simvastatin 40 MG tablet   Commonly known as: ZOCOR   Takes 1/2 tablet daily bedtime      SYSTANE OP   Apply 1 drop to eye as needed. For dry eyes.      warfarin 5 MG tablet   Commonly known as: COUMADIN   Take 5-7.5 mg by mouth daily. Take 1.5 tablets on Monday through Friday & 1 tablet on Saturday and Sunday.           Follow-up Information    Follow up on 05/02/2011. (10:15)           Signed: Alexis Frock 04/15/2011, 7:50 AM

## 2011-04-25 ENCOUNTER — Other Ambulatory Visit: Payer: Self-pay | Admitting: Pulmonary Disease

## 2011-05-02 ENCOUNTER — Encounter (HOSPITAL_COMMUNITY): Payer: Self-pay | Admitting: Urology

## 2011-05-15 ENCOUNTER — Telehealth: Payer: Self-pay | Admitting: Pulmonary Disease

## 2011-05-15 DIAGNOSIS — E119 Type 2 diabetes mellitus without complications: Secondary | ICD-10-CM

## 2011-05-15 DIAGNOSIS — E7800 Pure hypercholesterolemia, unspecified: Secondary | ICD-10-CM

## 2011-05-15 DIAGNOSIS — I10 Essential (primary) hypertension: Secondary | ICD-10-CM

## 2011-05-15 NOTE — Telephone Encounter (Signed)
Per SN:  BMET, Hgb A1C, and fasting lipid panel.  Orders have been placed.    Pt aware labs placed, and he will need to be fasting.  He verbalized understanding of this and voiced no further questions/cocnerns at this time.

## 2011-05-15 NOTE — Telephone Encounter (Signed)
Please have sn mark what labs are needed

## 2011-05-17 ENCOUNTER — Other Ambulatory Visit (INDEPENDENT_AMBULATORY_CARE_PROVIDER_SITE_OTHER): Payer: Medicare Other

## 2011-05-17 DIAGNOSIS — I10 Essential (primary) hypertension: Secondary | ICD-10-CM

## 2011-05-17 DIAGNOSIS — E7800 Pure hypercholesterolemia, unspecified: Secondary | ICD-10-CM

## 2011-05-17 DIAGNOSIS — E119 Type 2 diabetes mellitus without complications: Secondary | ICD-10-CM

## 2011-05-17 LAB — BASIC METABOLIC PANEL WITH GFR
BUN: 11 mg/dL (ref 6–23)
CO2: 31 meq/L (ref 19–32)
Calcium: 9.4 mg/dL (ref 8.4–10.5)
Chloride: 103 meq/L (ref 96–112)
Creatinine, Ser: 1.1 mg/dL (ref 0.4–1.5)
GFR: 81.66 mL/min
Glucose, Bld: 112 mg/dL — ABNORMAL HIGH (ref 70–99)
Potassium: 4.4 meq/L (ref 3.5–5.1)
Sodium: 140 meq/L (ref 135–145)

## 2011-05-17 LAB — LDL CHOLESTEROL, DIRECT: Direct LDL: 84.6 mg/dL

## 2011-05-17 LAB — LIPID PANEL
HDL: 45.4 mg/dL (ref >39.00)
Triglycerides: 303 mg/dL — ABNORMAL HIGH (ref 0.0–149.0)
VLDL: 60.6 mg/dL — ABNORMAL HIGH (ref 0.0–40.0)

## 2011-05-19 ENCOUNTER — Ambulatory Visit (INDEPENDENT_AMBULATORY_CARE_PROVIDER_SITE_OTHER): Payer: Medicare Other | Admitting: Pulmonary Disease

## 2011-05-19 ENCOUNTER — Encounter: Payer: Self-pay | Admitting: Pulmonary Disease

## 2011-05-19 VITALS — BP 126/70 | HR 60 | Temp 97.0°F | Ht 73.0 in | Wt 221.0 lb

## 2011-05-19 DIAGNOSIS — R319 Hematuria, unspecified: Secondary | ICD-10-CM | POA: Insufficient documentation

## 2011-05-19 DIAGNOSIS — I82409 Acute embolism and thrombosis of unspecified deep veins of unspecified lower extremity: Secondary | ICD-10-CM

## 2011-05-19 DIAGNOSIS — E7800 Pure hypercholesterolemia, unspecified: Secondary | ICD-10-CM

## 2011-05-19 DIAGNOSIS — M109 Gout, unspecified: Secondary | ICD-10-CM

## 2011-05-19 DIAGNOSIS — J984 Other disorders of lung: Secondary | ICD-10-CM

## 2011-05-19 DIAGNOSIS — N401 Enlarged prostate with lower urinary tract symptoms: Secondary | ICD-10-CM

## 2011-05-19 DIAGNOSIS — K219 Gastro-esophageal reflux disease without esophagitis: Secondary | ICD-10-CM

## 2011-05-19 DIAGNOSIS — L910 Hypertrophic scar: Secondary | ICD-10-CM

## 2011-05-19 DIAGNOSIS — E119 Type 2 diabetes mellitus without complications: Secondary | ICD-10-CM

## 2011-05-19 DIAGNOSIS — I10 Essential (primary) hypertension: Secondary | ICD-10-CM

## 2011-05-19 DIAGNOSIS — M1990 Unspecified osteoarthritis, unspecified site: Secondary | ICD-10-CM

## 2011-05-19 MED ORDER — METFORMIN HCL 500 MG PO TABS: 500.0000 mg | ORAL_TABLET | Freq: Two times a day (BID) | ORAL | Status: DC

## 2011-05-19 MED ORDER — METOPROLOL TARTRATE 50 MG PO TABS: 50.0000 mg | ORAL_TABLET | Freq: Every day | ORAL | Status: DC

## 2011-05-19 MED ORDER — AMLODIPINE BESYLATE 10 MG PO TABS: 10.0000 mg | ORAL_TABLET | Freq: Every day | ORAL | Status: DC

## 2011-05-19 MED ORDER — GLIMEPIRIDE 1 MG PO TABS: 1.0000 mg | ORAL_TABLET | Freq: Every day | ORAL | Status: DC

## 2011-05-19 NOTE — Patient Instructions (Signed)
Today we updated your med list in our EPIC system...    Continue your current medications the same...  We reviewed your recent blood work & gave you a copy for your records...  We decided to STOP the Simvastatin for now (due to your leg symptoms)...  Try the support hose we discussed & ask DrGolding about how long he plans to continue the Coumadin therapy...  Call for any questions...  Let's plan a follow up visit in another 6 months, but call anytime as needed for problems.Marland KitchenMarland Kitchen

## 2011-05-19 NOTE — Progress Notes (Signed)
Subjective:    Patient ID: Hector Rose, male    DOB: May 02, 1940, 71 y.o.   MRN: [id number]  HPI 71 y/o BM here for a follow up visit... he has mult med prob as noted below...  ~  May 13, 2010:  he hasn't had any gout or arthritic problems since getting back on the Allopurinol... he notes BP controlled on the Toprol/ Norvasc & he exercises on a treadmill for 15-20 min doing OK... he remains on coumadin followed by DrGolding in [place]... Lipids look good on diet + Simva40/ FishOil... but his DM control has not been adeq on current doses of Metform/ Glimep> BS at home runs up to 180 he says & labs today showed BS113, A1c=9.0.Marland KitchenMarland Kitchen we discussed diet & exercise & rec to double current meds> incr Metform500- 2Bid, and Glimep2mg Qam... his wt is unchanged at 232# & states his diet consists of "cutting back on sugar", & "not eating much bread"> we discussed this.  ~  November 17, 2010:  37mo ROV & doing well overall; he wants new DM meter & we provided one from the office today...    He continues on Coumadin for his hx DVT w/ protimes managed by DrGolding in [place], stable on 5mg  tabs alternating 1 tab w/ 1.5 tabs qod...    Known right base nodule= granuloma w/o change on serial CXRs; due for yearly f/u film today ==> clear, NAD...    BP controlled on Metoprolol & Amlodipine; BP= 112/70 & he is asymptomatic w/o CP, palpit, HA, dizzy, SOB, edema, etc...    Lipids controlled on Simva40 + diet & FishOil; FLPs have been wnl in recent yrs but current lab showed LDL 123 despite 7# wt loss to 225# today...    DM regulared w/ Metformin 500mg Bid & Glimep 2mg /d; Bs=120, A1c= 7.2; improved from previous...    Hx DJD/ Gout & no prob since starting on the Allopurinol rx... See Prob list below >>  ~  May 19, 2011:  72mo ROV & Artem was adm by Urology DrBorden for hematuria & abn Ucytology (on Coumadin per DrGolding) w/ prev neg CT Abd & office cysto; hosp cysto (w/ bladder bxs off Coumadin- all neg), retrograde  pyelogram (neg), he will continue to follow...     Known right base nodule> granuloma RLL w/o change on serial CXRs; yearly f/u film 9/12 ==> clear, NAD...    HBP> on Metop50Bid & Amlod10; BP= 126/70 & he is asymptomatic w/o CP, palpit, HA, dizzy, SOB, edema, etc...    Hx DVT> this occurred in 2007 & treated in [place] by [place] [facility] I suggested to pt that he might be able to come off coumadin & to discuss w/ DrGolding...    Lipids> prev on Simva40 + diet & FishOil; he stopped Simva on his own ~36mo ago "I was stiff"; FLP shows TChol 196, TG 303, HDL 45, LDL 85; ok off Simva now but needs better low fat diet & weight reduction...    DM> on Metformin 500mg Bid & Glimep1mg /d; BS=112, A1c= 7.4; ?it doesn't look like he increased the Amaryl to 2mg  as suggested in 2012 & of course he didn't bring med bottles to review; we decided to keep the same & advised better diet & weight reduction...    GI> on Omep20 as needed & up to date on colon screening...    GU> eval for hematuria as above by DrBorden...    Hx DJD/ Gout> no prob since starting on the Allopurinol rx... LABS 3/13:  FLP- TG not at goal on diet alone;  Chems- BS=120 A1c=7.4   PROBLEM LIST:    PULMONARY NODULE (ICD-518.89) - vague nodular opacity right base over diaphragm on old films... ~  1.7 cm benign granuloma... no change on serial CXR or CT's back to 2000. ~  CXR 2/10 is WNL- nodule not seen... ~  CXR 9/10 showed tort Ao, Cor WNL, lungs clear w/o nodule identified. ~  CXR 9/11 showed clear lungs, NAD.Marland Kitchen. ~  CXR 9/12 showed clear lungs, NAD...  HYPERTENSION (ICD-401.9) - controlled on TOPROL 50mg Bid and NORVASC 10mg /d... BP's at home in the 120-130/70 range, and measures 126/70 today- he denies HA, fatigue, visual changes, CP, palipit, dizziness, syncope, dyspnea, edema, etc. ~  he had a neg Cardiolite study in 10/04- no ischemia and EF=52%. ~  Aug09: eval DrWall for atyp CP & risk factors> Myoview 11/01/07 showed no evid for infarct,  +diaph attenuation, EF sl reduced at 47%.  DEEP VENOUS THROMBOPHLEBITIS (ICD-453.40) - Dx'd by LMD in [place] in 3/07... [facility] and had pain/ swelling L calf... ultrasound at [place] + for DVT & Rx COUMADIN by DrGolding (protimes q month)- no recent problems, he will check w/ DrGolding about discontinuing the Coumadin. ~  3/12:  Pt remains on Coumadin 63yrs after his single episode unprovoked DVT left leg, followed by DrGolding... ~  3/13:  Pt remains on Coumadin & I have suggested that he discuss coming off this med w/ his LMD, DrGolding...  HYPERCHOLESTEROLEMIA (ICD-272.0) - controlled on SIMVASTATIN 40mg /d & FISH OIL...  ~  [place] 9/08 showed TChol 148, TG 200, HDL 30, LDL 78... on Advicor 500-20 at that time- keep same. ~  [place] 3/09 showed TChol 214, TG 236, HDL 44, LDL 114... on Advicor- rec change to Simvastatin40. ~  FLP 7/09 showed TChol 129, Tg 188, HDL 35, LDL 57... rec- keep same. ~  FLP 1/10 showed TChol 142, TG 165, HDL 35, LDL 74... stable on Simva40. ~  FLP 3/11 showed TChol 127, TG 147, HDL 34, LDL 64 ~  FLP 9/11 showed TChol 143, TG 151, HDL 36, LDL 77 ~  FLP 3/12 on Simva40 showed TChol 161, TG 103, HDL 43, LDL 98 ~  FLP 9/12 on Simva40 showed TChol 196, TG 119, HDL 49, LDL 123... Same med, better diet effort. ~  FLP 3/13 off Simva showed TChol 196, TG 303, HDL 45, LDL 85... Needs better low fat diet & wt reduction.  DM (ICD-250.00) - currently on METFORMIN 500mg Bid & GLIMEPIRIDE 1mg /d (NOTE: hx prev hypoglycemia from Glucovance). ~  labs 9/08 showed FBS=106 and HgA1C=6.5.Marland KitchenMarland Kitchen on Glucovance 1/2 Bid. ~  labs 3/09 showed BS= 99, HgA1c= 7.0.Marland KitchenMarland Kitchen on Glucovance 1/2 Qam only- rec to incr to Bid again. ~  labs 7/09 showed BS= 104, HgA1c= 6.8.Marland KitchenMarland Kitchen rec- keep same. ~  pt reports sugars too low ~50 at 11AM if he takes even 1/2 tab in AM, therefore he changed to 1/2 at dinner only... ~  labs 1/10 showed BS= 125, A1c= 7.0 ~  labs 9/10 showed BS= 102, A1c=  6.8 ~  labs 3/11 showed BS= 110, A1c= 7.0.Marland KitchenMarland Kitchen continue same meds, get wt down! ~  labs 9/11 showed BS= 129, A1c= 7.7.Marland KitchenMarland Kitchen rec change to METFORMIN ER 500mg  Qam & GLIMEPIRIDE 1mg  Qam. ~  labs 3/12 showed BS= 113, A1c= 9.0.Marland KitchenMarland Kitchen rec to double meds> Metform500Bid, Glim2mg AM (?he never incr the Amaryl) ~  Labs 9/12 showed BS= 120, A1c= 7.2.Marland KitchenMarland Kitchen Keep same (?  on Glimep1mg ?) + low carb diet. ~  Labs 3/13 on Metform500Bid+Glim1mg  showed BS=120, A1c=7.4.Marland KitchenMarland Kitchen Keep same med, better diet, get wt down  GERD (ICD-530.81) - takes OMEPRAZOLE 20mg  Prn for reflux symptoms... ~  neg colonoscopy by DrStark in 6/00, and again 8/08 ( x sm hem's)... f/u planned 10 yrs.  BENIGN PROSTATIC HYPERTROPHY, WITH OBSTRUCTION (ICD-600.01) - he sees DrDavis yearly- pt reports "PSA was good" (we don't have notes from him)... S/P open prostatectomy 1999 by [id number]... Hx prostatitis in past... uses Cialis for ED. ~  labs 3/11 showed BUN= 10, Crear= 1.2... PSA done by Urology. ~  labs 9/11 showed BUN= 11, Creat= 1.3, PSA= 1.13 ~  he had f/u Urology DrBorden- BPH/ LUTS, PSA screening- "it was good" per pt. ~  Labs 9/12 showed PSA= 1.23  RENAL CALCULUS (ICD-592.0) HEMATURIA >> on Coumadin per DrGolding in [place] & eval by DrBorden, Urology... ~  adm 2/13 by Urology DrBorden for hematuria & abn Ucytology (on Coumadin per DrGolding) w/ prev neg CT Abd & office cysto; hosp cysto (w/ bladder bxs off Coumadin- all neg), retrograde pyelogram (neg), he will continue to follow...  DEGENERATIVE JOINT DISEASE (ICD-715.90) - hx of pain on his right side and right elbow... prev Rx w/ Etodolac, Parafon, heat and elbow pad... refer to ortho if symptoms persist... now he uses OTC anti-inflamm Rx Prn.  GOUT (ICD-274.9) - states he had episode right elbow arthritis and gout Rx'd by DrSypher 4/09 w/ Colchicine & Allopurinol 100mg /d... ~  labs 7/09 show Uric 6.1.Marland KitchenMarland Kitchen OK on Allopurinol 100mg /d... ~  labs 1/10 showed Uric= 6.5 ~  9/11:  pt reports that he  stopped Allopurinol earlier this yr & had recent gout attack per DrGolding Rx w/ Probenecid... he would like to restart the Allopurinol preventive Rx & I agree> ALLOPURINOL 300mg /d written.  CARPAL TUNNEL SYNDROME (ICD-354.0) - treated by DrSypher over the years for CTS and mult trigger fingers (w/ surg)...  KELOID (ICD-701.4)  Health Maintenance - pt had "bug bite" on leg 9/10 w/ Tetanus shot by DrGolding in [place]... prev PNEUMOVAX 2000 at age 81, therefore given f/u PNEUMOVAX in 2010 at age 61... he gets yearly flu vaccine each fall.   Past Surgical History  Procedure Date  . Prostatectomy 1999    Dr. Rosana Hoes  . Cosmetic surgery 2000    Keloid injections  . Ortho surgery on fingers 2005 & 2008    Dr. Lorelle Formosa finger release  . Cystoscopy with biopsy 04/14/2011    Procedure: CYSTOSCOPY WITH BIOPSY;  Surgeon: Dutch Gray, MD;  Location: [facility];  Service: Urology;  Laterality: N/A;  Bladder Biopsies Biopsy of Prostatic Urethra    . Cystoscopy/retrograde/ureteroscopy 04/14/2011    Procedure: CYSTOSCOPY/RETROGRADE/URETEROSCOPY;  Surgeon: Dutch Gray, MD;  Location: [facility];  Service: Urology;  Laterality: Bilateral;  (Bil) RPG     Outpatient Encounter Prescriptions as of 05/19/2011  Medication Sig Dispense Refill  . ACCU-CHEK AVIVA PLUS test strip TEST TWICE DAILY.  100 each  11  . allopurinol (ZYLOPRIM) 300 MG tablet TAKE 1 TABLET BY MOUTH ONCE DAILY  30 tablet  12  . amLODipine (NORVASC) 10 MG tablet Takes daily am      . glimepiride (AMARYL) 1 MG tablet TAKE 1 TABLET EVERY MORNING  30 tablet  6  . Lancets (ACCU-CHEK MULTICLIX) lancets TEST TWICE DAILY.  102 each  11  . metFORMIN (GLUCOPHAGE) 500 MG tablet TAKE 1 TABLET BY MOUTH TWO TIMES A DAY  180 tablet  3  . Multiple  Vitamin (MULITIVITAMIN WITH MINERALS) TABS Take 1 tablet by mouth daily. Mens One a Day.      Marland Kitchen omeprazole (PRILOSEC) 20 MG capsule Take 20 mg by mouth daily as needed. As needed for stomach acid      . Polyethyl  Glycol-Propyl Glycol (SYSTANE OP) Apply 1 drop to eye as needed. For dry eyes.      . polyvinyl alcohol (LIQUIFILM TEARS) 1.4 % ophthalmic solution Place 1 drop into both eyes as needed. For dry eyes.      Marland Kitchen warfarin (COUMADIN) 5 MG tablet Take 5-7.5 mg by mouth daily. Take 1.5 tablets on Monday through Friday & 1 tablet on Saturday and Sunday.      . metoprolol (LOPRESSOR) 50 MG tablet Take 50 mg by mouth daily.       . simvastatin (ZOCOR) 40 MG tablet Takes 1/2 tablet daily bedtime      . DISCONTD: azithromycin (ZITHROMAX) 250 MG tablet Take 250 mg by mouth daily.        Allergies  Allergen Reactions  . Ace Inhibitors Swelling    REACTION: angio edema (swelling of lips)    Current Medications, Allergies, Past Medical History, Past Surgical History, Family History, and Social History were reviewed in [employer] record.    Review of Systems        See HPI - all other systems neg except as noted...   The patient complains of dyspnea on exertion.  The patient denies anorexia, fever, weight loss, weight gain, vision loss, decreased hearing, hoarseness, chest pain, syncope, peripheral edema, prolonged cough, headaches, hemoptysis, abdominal pain, melena, hematochezia, severe indigestion/heartburn, hematuria, incontinence, muscle weakness, suspicious skin lesions, transient blindness, difficulty walking, depression, unusual weight change, abnormal bleeding, enlarged lymph nodes, and angioedema.     Objective:   Physical Exam     WD, WN, overwt 71 y/o BM in NAD...  GENERAL:  Alert & oriented; pleasant & cooperative... HEENT:  [place]/AT, EOM-wnl, PERRLA, EACs-wax, TMs-wnl, NOSE-clear, THROAT-clear & wnl, no angioedema. NECK:  Supple w/ full ROM; no JVD; normal carotid impulses w/o bruits; no thyromegaly or nodules palpated; no lymphadenopathy. CHEST:  Clear to P & A; without wheezes/ rales/ or rhonchi. HEART:  Regular Rhythm; without murmurs/ rubs/ or gallops. ABDOMEN:   Soft & nontender; normal bowel sounds; no organomegaly or masses detected. RECTAL:  prostate 3+, no nodules, stool heme neg... EXT:  without deformities, mild arthritic changes; no varicose veins/ venous insuffic/ or edema. NEURO:  CN's intact; motor testing normal; sensory testing normal; gait normal & balance OK. DERM:  Keloids noted...  RADIOLOGY DATA:  Reviewed in the EPIC EMR & discussed w/ the patient...  LABORATORY DATA:  Reviewed in the EPIC EMR & discussed w/ the patient...   Assessment & Plan:   Pulm Nodule>  Known 1.7cm benign granuloma right base above diaph seen on old CTs but not readily visible on plain films; CXR 9/12 looked good, clear, NAD.  HBP>  Controlled on BBlocker, CCB; tol well, continue same...  Hx DVT>  DrGolding has apparently chosen to continue the Coumadin; hx single episode left leg DVT in 2007- Dx in [place] & followed there...  CHOL>  Off Simva40 on his own now due to "stiffness"; FLP w/ fair chol numbers but elev TG; he wants to try diet alone- advised better low fat & get wt down.  DM>  On Metformin Bid & Glimep1mg /d; A1c is the same & rec continue same meds but better diet & get wt  down...  GI> GERD> on PPI as needed; had colon 2008, neg & f/u due 2018...  GU> BPH w/ open prostatectomy 1999 by DrDavis;  PSA remains WNL; he has remote hx kidney stone; new prob of Hematuria & ?abn cytology in office; Cysto, retrogrades & bladder bxs were all neg 2/13 per DrBorden & he continues to follow...  DJD/ Gout/ Hx CTS>  Stable on OTC anti-inflamm as needed and Allopurinol daily...  Keloid>  Aware, no change...   Patient's Medications  New Prescriptions   No medications on file  Previous Medications   ACCU-CHEK AVIVA PLUS TEST STRIP    TEST TWICE DAILY.   ALLOPURINOL (ZYLOPRIM) 300 MG TABLET    TAKE 1 TABLET BY MOUTH ONCE DAILY   LANCETS (ACCU-CHEK MULTICLIX) LANCETS    TEST TWICE DAILY.   MULTIPLE VITAMIN (MULITIVITAMIN WITH MINERALS) TABS    Take 1  tablet by mouth daily. Mens One a Day.   POLYETHYL GLYCOL-PROPYL GLYCOL (SYSTANE OP)    Apply 1 drop to eye as needed. For dry eyes.   POLYVINYL ALCOHOL (LIQUIFILM TEARS) 1.4 % OPHTHALMIC SOLUTION    Place 1 drop into both eyes as needed. For dry eyes.   WARFARIN (COUMADIN) 5 MG TABLET    Take 5-7.5 mg by mouth daily. Take 1.5 tablets on Monday through Friday & 1 tablet on Saturday and Sunday.  Modified Medications   Modified Medication Previous Medication   AMLODIPINE (NORVASC) 10 MG TABLET amLODipine (NORVASC) 10 MG tablet      Take 1 tablet (10 mg total) by mouth daily. Takes daily am    Takes daily am   GLIMEPIRIDE (AMARYL) 1 MG TABLET glimepiride (AMARYL) 1 MG tablet      Take 1 tablet (1 mg total) by mouth daily before breakfast.    TAKE 1 TABLET EVERY MORNING   METFORMIN (GLUCOPHAGE) 500 MG TABLET metFORMIN (GLUCOPHAGE) 500 MG tablet      Take 1 tablet (500 mg total) by mouth 2 (two) times daily with a meal.    TAKE 1 TABLET BY MOUTH TWO TIMES A DAY   METOPROLOL (LOPRESSOR) 50 MG TABLET metoprolol (LOPRESSOR) 50 MG tablet      Take 1 tablet (50 mg total) by mouth daily.    Take 50 mg by mouth daily.    OMEPRAZOLE (PRILOSEC) 20 MG CAPSULE omeprazole (PRILOSEC) 20 MG capsule      TAKE ONE CAPSULE BY MOUTH ONCE DAILY AS NEEDED FOR STOMACH ACID    Take 20 mg by mouth daily as needed. As needed for stomach acid  Discontinued Medications   AZITHROMYCIN (ZITHROMAX) 250 MG TABLET    Take 250 mg by mouth daily.   SIMVASTATIN (ZOCOR) 40 MG TABLET    Takes 1/2 tablet daily bedtime

## 2011-05-22 ENCOUNTER — Other Ambulatory Visit: Payer: Self-pay | Admitting: Pulmonary Disease

## 2011-06-25 ENCOUNTER — Other Ambulatory Visit: Payer: Self-pay | Admitting: Pulmonary Disease

## 2011-07-26 ENCOUNTER — Other Ambulatory Visit: Payer: Self-pay | Admitting: Pulmonary Disease

## 2011-09-11 ENCOUNTER — Other Ambulatory Visit: Payer: Self-pay | Admitting: Pulmonary Disease

## 2011-09-28 ENCOUNTER — Encounter: Payer: Self-pay | Admitting: Adult Health

## 2011-09-28 ENCOUNTER — Ambulatory Visit (INDEPENDENT_AMBULATORY_CARE_PROVIDER_SITE_OTHER): Payer: Medicare Other | Admitting: Adult Health

## 2011-09-28 VITALS — BP 138/70 | HR 60 | Temp 97.2°F | Ht 74.0 in | Wt 228.8 lb

## 2011-09-28 DIAGNOSIS — B353 Tinea pedis: Secondary | ICD-10-CM

## 2011-09-28 NOTE — Patient Instructions (Addendum)
Lotrimin Af to feet Twice daily for at least 4-6 weeks or until resolved completely  May also use Lotrimin spray Twice daily   Keep feet dry >use dry fit socks , dry feet off several times a day Also wear shoes/flops at pools/locker room  IF not improving in 4 weeks , call back and we can refer to podiatry  follow up Dr. Lenna Gilford  As planned and As needed   Keep on low sweet diet and low cholesterol diet

## 2011-09-28 NOTE — Progress Notes (Signed)
Subjective:    Patient ID: Hector Rose, male    DOB: 05-09-1940, 71 y.o.   MRN: [id number]  HPI 71 y/o BM   ~  May 13, 2010:  he hasn't had any gout or arthritic problems since getting back on the Allopurinol... he notes BP controlled on the Toprol/ Norvasc & he exercises on a treadmill for 15-20 min doing OK... he remains on coumadin followed by DrGolding in [place]... Lipids look good on diet + Simva40/ FishOil... but his DM control has not been adeq on current doses of Metform/ Glimep> BS at home runs up to 180 he says & labs today showed BS113, A1c=9.0.Marland KitchenMarland Kitchen we discussed diet & exercise & rec to double current meds> incr Metform500- 2Bid, and Glimep2mg Qam... his wt is unchanged at 232# & states his diet consists of "cutting back on sugar", & "not eating much bread"> we discussed this.  ~  November 17, 2010:  35mo ROV & doing well overall; he wants new DM meter & we provided one from the office today...    He continues on Coumadin for his hx DVT w/ protimes managed by DrGolding in [place], stable on 5mg  tabs alternating 1 tab w/ 1.5 tabs qod...    Known right base nodule= granuloma w/o change on serial CXRs; due for yearly f/u film today ==> clear, NAD...    BP controlled on Metoprolol & Amlodipine; BP= 112/70 & he is asymptomatic w/o CP, palpit, HA, dizzy, SOB, edema, etc...    Lipids controlled on Simva40 + diet & FishOil; FLPs have been wnl in recent yrs but current lab showed LDL 123 despite 7# wt loss to 225# today...    DM regulared w/ Metformin 500mg Bid & Glimep 2mg /d; Bs=120, A1c= 7.2; improved from previous...    Hx DJD/ Gout & no prob since starting on the Allopurinol rx... See Prob list below >>  ~  May 19, 2011:  27mo ROV & [place] was adm by Urology DrBorden for hematuria & abn Ucytology (on Coumadin per DrGolding) w/ prev neg CT Abd & office cysto; hosp cysto (w/ bladder bxs off Coumadin- all neg), retrograde pyelogram (neg), he will continue to follow...     Known right base  nodule> granuloma RLL w/o change on serial CXRs; yearly f/u film 9/12 ==> clear, NAD...    HBP> on Metop50Bid & Amlod10; BP= 126/70 & he is asymptomatic w/o CP, palpit, HA, dizzy, SOB, edema, etc...    Hx DVT> this occurred in 2007 & treated in [place] by [place] [facility] I suggested to pt that he might be able to come off coumadin & to discuss w/ DrGolding...    Lipids> prev on Simva40 + diet & FishOil; he stopped Simva on his own ~33mo ago "I was stiff"; FLP shows TChol 196, TG 303, HDL 45, LDL 85; ok off Simva now but needs better low fat diet & weight reduction...    DM> on Metformin 500mg Bid & Glimep1mg /d; BS=112, A1c= 7.4; ?it doesn't look like he increased the Amaryl to 2mg  as suggested in 2012 & of course he didn't bring med bottles to review; we decided to keep the same & advised better diet & weight reduction...    GI> on Omep20 as needed & up to date on colon screening...    GU> eval for hematuria as above by DrBorden...    Hx DJD/ Gout> no prob since starting on the Allopurinol rx... LABS 3/13:  FLP- TG not at goal on diet alone;  Chems- BS=120 A1c=7.4  09/28/2011 Acute OV  Complains of both feet itching all over x 1 year. States there are no bumps or rash.  Very pruritic at times.  No numbness, paraesthesia or known injury  Has used several otc anti itch creams on/off without help Goes to pool and gym several times a week.  No sores or ulcers on feet.  No fever  PROBLEM LIST:    PULMONARY NODULE (ICD-518.89) - vague nodular opacity right base over diaphragm on old films... ~  1.7 cm benign granuloma... no change on serial CXR or CT's back to 2000. ~  CXR 2/10 is WNL- nodule not seen... ~  CXR 9/10 showed tort Ao, Cor WNL, lungs clear w/o nodule identified. ~  CXR 9/11 showed clear lungs, NAD.Marland Kitchen. ~  CXR 9/12 showed clear lungs, NAD...  HYPERTENSION (ICD-401.9) - controlled on TOPROL 50mg Bid and NORVASC 10mg /d... BP's at home in the 120-130/70 range, and measures 126/70 today- he  denies HA, fatigue, visual changes, CP, palipit, dizziness, syncope, dyspnea, edema, etc. ~  he had a neg Cardiolite study in 10/04- no ischemia and EF=52%. ~  Aug09: eval DrWall for atyp CP & risk factors> Myoview 11/01/07 showed no evid for infarct, +diaph attenuation, EF sl reduced at 47%.  DEEP VENOUS THROMBOPHLEBITIS (ICD-453.40) - Dx'd by LMD in [place], [place] in 3/07... [facility] and had [facility]/ swelling L calf... ultrasound at [facility] + for DVT & Rx COUMADIN by DrGolding (protimes q month)- no recent problems, he will check w/ DrGolding about discontinuing the Coumadin. ~  3/12:  Pt remains on Coumadin 63yrs after his single episode unprovoked DVT left leg, followed by DrGolding... ~  3/13:  Pt remains on Coumadin & I have suggested that he discuss coming off this med w/ his LMD, DrGolding...  HYPERCHOLESTEROLEMIA (ICD-272.0) - controlled on SIMVASTATIN 40mg /d & FISH OIL...  ~  [place] 9/08 showed TChol 148, TG 200, HDL 30, LDL 78... on Advicor 500-20 at that time- keep same. ~  [place] 3/09 showed TChol 214, TG 236, HDL 44, LDL 114... on Advicor- rec change to Simvastatin40. ~  FLP 7/09 showed TChol 129, Tg 188, HDL 35, LDL 57... rec- keep same. ~  FLP 1/10 showed TChol 142, TG 165, HDL 35, LDL 74... stable on Simva40. ~  FLP 3/11 showed TChol 127, TG 147, HDL 34, LDL 64 ~  FLP 9/11 showed TChol 143, TG 151, HDL 36, LDL 77 ~  FLP 3/12 on Simva40 showed TChol 161, TG 103, HDL 43, LDL 98 ~  FLP 9/12 on Simva40 showed TChol 196, TG 119, HDL 49, LDL 123... Same med, better diet effort. ~  FLP 3/13 off Simva showed TChol 196, TG 303, HDL 45, LDL 85... Needs better low fat diet & wt reduction.  DM (ICD-250.00) - currently on METFORMIN 500mg Bid & GLIMEPIRIDE 1mg /d (NOTE: hx prev hypoglycemia from Glucovance). ~  labs 9/08 showed FBS=106 and HgA1C=6.5.Marland KitchenMarland Kitchen on Glucovance 1/2 Bid. ~  labs 3/09 showed BS= 99, HgA1c= 7.0.Marland KitchenMarland Kitchen on Glucovance 1/2 Qam only- rec to incr to Bid again. ~   labs 7/09 showed BS= 104, HgA1c= 6.8.Marland KitchenMarland Kitchen rec- keep same. ~  pt reports sugars too low ~50 at 11AM if he takes even 1/2 tab in AM, therefore he changed to 1/2 at dinner only... ~  labs 1/10 showed BS= 125, A1c= 7.0 ~  labs 9/10 showed BS= 102, A1c= 6.8 ~  labs 3/11 showed BS= 110, A1c= 7.0.Marland KitchenMarland Kitchen continue same meds, get wt down! ~  labs 9/11 showed BS= 129, A1c= 7.7.Marland KitchenMarland Kitchen rec change to METFORMIN ER 500mg  Qam & GLIMEPIRIDE 1mg  Qam. ~  labs 3/12 showed BS= 113, A1c= 9.0.Marland KitchenMarland Kitchen rec to double meds> Metform500Bid, Glim2mg AM (?he never incr the Amaryl) ~  Labs 9/12 showed BS= 120, A1c= 7.2.Marland KitchenMarland Kitchen Keep same (?on Glimep1mg ?) + low carb diet. ~  Labs 3/13 on Metform500Bid+Glim1mg  showed BS=120, A1c=7.4.Marland KitchenMarland Kitchen Keep same med, better diet, get wt down  GERD (ICD-530.81) - takes OMEPRAZOLE 20mg  Prn for reflux symptoms... ~  neg colonoscopy by DrStark in 6/00, and again 8/08 ( x sm hem's)... f/u planned 10 yrs.  BENIGN PROSTATIC HYPERTROPHY, WITH OBSTRUCTION (ICD-600.01) - he sees DrDavis yearly- pt reports "PSA was good" (we don't have notes from him)... S/P open prostatectomy 1999 by [id number]... Hx prostatitis in past... uses Cialis for ED. ~  labs 3/11 showed BUN= 10, Crear= 1.2... PSA done by Urology. ~  labs 9/11 showed BUN= 11, Creat= 1.3, PSA= 1.13 ~  he had f/u Urology DrBorden- BPH/ LUTS, PSA screening- "it was good" per pt. ~  Labs 9/12 showed PSA= 1.23  RENAL CALCULUS (ICD-592.0) HEMATURIA >> on Coumadin per DrGolding in [place] & eval by DrBorden, Urology... ~  adm 2/13 by Urology DrBorden for hematuria & abn Ucytology (on Coumadin per DrGolding) w/ prev neg CT Abd & office cysto; hosp cysto (w/ bladder bxs off Coumadin- all neg), retrograde pyelogram (neg), he will continue to follow...  DEGENERATIVE JOINT DISEASE (ICD-715.90) - hx of pain on his right side and right elbow... prev Rx w/ Etodolac, Parafon, heat and elbow pad... refer to ortho if symptoms persist... now he uses OTC anti-inflamm Rx Prn.  GOUT  (ICD-274.9) - states he had episode right elbow arthritis and gout Rx'd by DrSypher 4/09 w/ Colchicine & Allopurinol 100mg /d... ~  labs 7/09 show Uric 6.1.Marland KitchenMarland Kitchen OK on Allopurinol 100mg /d... ~  labs 1/10 showed Uric= 6.5 ~  9/11:  pt reports that he stopped Allopurinol earlier this yr & had recent gout attack per DrGolding Rx w/ Probenecid... he would like to restart the Allopurinol preventive Rx & I agree> ALLOPURINOL 300mg /d written.  CARPAL TUNNEL SYNDROME (ICD-354.0) - treated by DrSypher over the years for CTS and mult trigger fingers (w/ surg)...  KELOID (ICD-701.4)  Health Maintenance - pt had "bug bite" on leg 9/10 w/ Tetanus shot by DrGolding in [place]... prev PNEUMOVAX 2000 at age 41, therefore given f/u PNEUMOVAX in 2010 at age 77... he gets yearly flu vaccine each fall.   Past Surgical History  Procedure Date  . Prostatectomy 1999    Dr. Rosana Hoes  . Cosmetic surgery 2000    Keloid injections  . Ortho surgery on fingers 2005 & 2008    Dr. Lorelle Formosa finger release  . Cystoscopy with biopsy 04/14/2011    Procedure: CYSTOSCOPY WITH BIOPSY;  Surgeon: Dutch Gray, MD;  Location: [facility];  Service: Urology;  Laterality: N/A;  Bladder Biopsies Biopsy of Prostatic Urethra    . Cystoscopy/retrograde/ureteroscopy 04/14/2011    Procedure: CYSTOSCOPY/RETROGRADE/URETEROSCOPY;  Surgeon: Dutch Gray, MD;  Location: [facility];  Service: Urology;  Laterality: Bilateral;  (Bil) RPG     Outpatient Encounter Prescriptions as of 09/28/2011  Medication Sig Dispense Refill  . ACCU-CHEK AVIVA PLUS test strip TEST TWICE DAILY.  100 each  11  . allopurinol (ZYLOPRIM) 300 MG tablet TAKE 1 TABLET BY MOUTH ONCE DAILY  30 tablet  12  . amLODipine (NORVASC) 10 MG tablet Take 1 tablet (10 mg total) by mouth daily. Takes daily am  90 tablet  3  . glimepiride (AMARYL) 1 MG tablet Take 1 tablet (1 mg total) by mouth daily before breakfast.  90 tablet  3  . Lancets (ACCU-CHEK MULTICLIX) lancets TEST TWICE DAILY.   102 each  11  . metFORMIN (GLUCOPHAGE) 500 MG tablet Take 1 tablet (500 mg total) by mouth 2 (two) times daily with a meal.  180 tablet  3  . metoprolol (LOPRESSOR) 50 MG tablet Take 1 tablet (50 mg total) by mouth daily.  90 tablet  3  . Multiple Vitamin (MULITIVITAMIN WITH MINERALS) TABS Take 1 tablet by mouth daily. Mens One a Day.      Marland Kitchen omeprazole (PRILOSEC) 20 MG capsule TAKE ONE CAPSULE BY MOUTH ONCE DAILY AS NEEDED FOR STOMACH ACID  30 capsule  5  . Polyethyl Glycol-Propyl Glycol (SYSTANE OP) Apply 1 drop to eye as needed. For dry eyes.      Marland Kitchen warfarin (COUMADIN) 5 MG tablet Take 5-7.5 mg by mouth daily. Take 1.5 tablets on Monday through Friday & 1 tablet on Saturday and Sunday.      Marland Kitchen CIALIS 20 MG tablet TAKE AS DIRECTED BY DOCTOR FOR ERECTILE DYSFUNCTION  10 each  5  . polyvinyl alcohol (LIQUIFILM TEARS) 1.4 % ophthalmic solution Place 1 drop into both eyes as needed. For dry eyes.      Marland Kitchen DISCONTD: glimepiride (AMARYL) 1 MG tablet TAKE 1 TABLET EVERY MORNING  30 tablet  6  . DISCONTD: metoprolol (LOPRESSOR) 50 MG tablet TAKE 1 TABLET BY MOUTH TWO TIMES A DAY  60 tablet  6    Allergies  Allergen Reactions  . Ace Inhibitors Swelling    REACTION: angio edema (swelling of lips)    Current Medications, Allergies, Past Medical History, Past Surgical History, Family History, and Social History were reviewed in [employer] record.    Review of Systems       Constitutional:   No  weight loss, night sweats,  Fevers, chills, fatigue, or  lassitude.  HEENT:   No headaches,  Difficulty swallowing,  Tooth/dental problems, or  Sore throat,                No sneezing, itching, ear ache, nasal congestion, post nasal drip,   CV:  No chest pain,  Orthopnea, PND, swelling in lower extremities, anasarca, dizziness, palpitations, syncope.   GI  No heartburn, indigestion, abdominal pain, nausea, vomiting, diarrhea, change in bowel habits, loss of appetite, bloody stools.    Resp: No shortness of breath with exertion or at rest.  No excess mucus, no productive cough,  No non-productive cough,  No coughing up of blood.  No change in color of mucus.  No wheezing.  No chest wall deformity  Skin: pruritis on feet   GU: no dysuria, change in color of urine, no urgency or frequency.  No flank pain, no hematuria   MS:  No joint pain or swelling.  No decreased range of motion.  No back pain.  Psych:  No change in mood or affect. No depression or anxiety.  No memory loss.       Objective:   Physical Exam    WD, WN, overwt 71 y/o BM in NAD...  GENERAL:  Alert & oriented; pleasant & cooperative... HEENT:  [place]/AT,   TMs-wnl, NOSE-clear, THROAT-clear & wnl  EXT:  without deformities, mild arthritic changes; no varicose veins/ venous insuffic/ or edema. Pulses intact  NEURO:   motor testing normal; sensory testing normal;  gait normal & balance OK., nml sensation ,  DERM:  Both great toes with hypertrophic toe nails ? Onchymosis.  Mild scaling along hands and feet  No open sores or cracking     Assessment & Plan:

## 2011-09-28 NOTE — Assessment & Plan Note (Signed)
Athletes foot in pt with underlying DM   Plan  Lotrimin Af to feet Twice daily for at least 4-6 weeks or until resolved completely  May also use Lotrimin spray Twice daily   Keep feet dry >use dry fit socks , dry feet off several times a day Also wear shoes/flops at pools/locker room  IF not improving in 4 weeks , call back and we can refer to podiatry  follow up Dr. Lenna Gilford  As planned and As needed   Keep on low sweet diet and low cholesterol diet

## 2011-11-23 ENCOUNTER — Telehealth: Payer: Self-pay | Admitting: Pulmonary Disease

## 2011-11-23 NOTE — Telephone Encounter (Signed)
On 11/27/11-pt has 6 month follow up. Please advise what labs pt will need to have done. Please advise SN thanks

## 2011-11-24 ENCOUNTER — Ambulatory Visit: Payer: Medicare Other | Admitting: Pulmonary Disease

## 2011-11-24 ENCOUNTER — Other Ambulatory Visit: Payer: Self-pay | Admitting: Pulmonary Disease

## 2011-11-24 ENCOUNTER — Other Ambulatory Visit (INDEPENDENT_AMBULATORY_CARE_PROVIDER_SITE_OTHER): Payer: Medicare Other

## 2011-11-24 DIAGNOSIS — N401 Enlarged prostate with lower urinary tract symptoms: Secondary | ICD-10-CM

## 2011-11-24 DIAGNOSIS — K649 Unspecified hemorrhoids: Secondary | ICD-10-CM

## 2011-11-24 DIAGNOSIS — F419 Anxiety disorder, unspecified: Secondary | ICD-10-CM

## 2011-11-24 DIAGNOSIS — I10 Essential (primary) hypertension: Secondary | ICD-10-CM

## 2011-11-24 DIAGNOSIS — E119 Type 2 diabetes mellitus without complications: Secondary | ICD-10-CM

## 2011-11-24 DIAGNOSIS — E7800 Pure hypercholesterolemia, unspecified: Secondary | ICD-10-CM

## 2011-11-24 DIAGNOSIS — N138 Other obstructive and reflux uropathy: Secondary | ICD-10-CM

## 2011-11-24 DIAGNOSIS — N200 Calculus of kidney: Secondary | ICD-10-CM

## 2011-11-24 DIAGNOSIS — F411 Generalized anxiety disorder: Secondary | ICD-10-CM

## 2011-11-24 LAB — BASIC METABOLIC PANEL
CO2: 27 mEq/L (ref 19–32)
Chloride: 105 mEq/L (ref 96–112)
GFR: 83.22 mL/min (ref >60.00)
Glucose, Bld: 125 mg/dL — ABNORMAL HIGH (ref 70–99)
Potassium: 4.2 mEq/L (ref 3.5–5.1)
Sodium: 139 mEq/L (ref 135–145)

## 2011-11-24 LAB — HEPATIC FUNCTION PANEL
ALT: 26 U/L (ref 0–53)
AST: 30 U/L (ref 0–37)
Albumin: 4.3 g/dL (ref 3.5–5.2)
Alkaline Phosphatase: 54 U/L (ref 39–117)

## 2011-11-24 LAB — LDL CHOLESTEROL, DIRECT: Direct LDL: 69.5 mg/dL

## 2011-11-24 LAB — CBC WITH DIFFERENTIAL/PLATELET
Basophils Absolute: 0 10*3/uL (ref 0.0–0.1)
HCT: 44 % (ref 39.0–52.0)
Lymphs Abs: 1.5 10*3/uL (ref 0.7–4.0)
Monocytes Absolute: 0.6 10*3/uL (ref 0.1–1.0)
Monocytes Relative: 9.1 % (ref 3.0–12.0)
Neutrophils Relative %: 63.2 % (ref 43.0–77.0)
Platelets: 161 10*3/uL (ref 150.0–400.0)
RDW: 13.7 % (ref 11.5–14.6)

## 2011-11-24 LAB — LIPID PANEL: HDL: 37.8 mg/dL — ABNORMAL LOW (ref >39.00)

## 2011-11-24 LAB — HEMOGLOBIN A1C: Hgb A1c MFr Bld: 7.1 % — ABNORMAL HIGH (ref 4.6–6.5)

## 2011-11-24 LAB — TSH: TSH: 1.19 u[IU]/mL (ref 0.35–5.50)

## 2011-11-24 NOTE — Telephone Encounter (Signed)
Spoke with pt and notified of recs per SN. Pt verbalized understanding and states nothing further needed.

## 2011-11-24 NOTE — Telephone Encounter (Signed)
Per SN----full labs are in the computer for the pt---blood flow in the legs is checked by doppler ultrasound and has to be ordered.  Per SN they can discuss at his ov.  thanks

## 2011-11-27 ENCOUNTER — Encounter: Payer: Self-pay | Admitting: Pulmonary Disease

## 2011-11-27 ENCOUNTER — Ambulatory Visit (INDEPENDENT_AMBULATORY_CARE_PROVIDER_SITE_OTHER): Payer: Medicare Other | Admitting: Pulmonary Disease

## 2011-11-27 VITALS — BP 120/78 | HR 65 | Temp 97.8°F | Ht 73.0 in | Wt 226.0 lb

## 2011-11-27 DIAGNOSIS — K219 Gastro-esophageal reflux disease without esophagitis: Secondary | ICD-10-CM

## 2011-11-27 DIAGNOSIS — N401 Enlarged prostate with lower urinary tract symptoms: Secondary | ICD-10-CM

## 2011-11-27 DIAGNOSIS — Z23 Encounter for immunization: Secondary | ICD-10-CM

## 2011-11-27 DIAGNOSIS — E119 Type 2 diabetes mellitus without complications: Secondary | ICD-10-CM

## 2011-11-27 DIAGNOSIS — R319 Hematuria, unspecified: Secondary | ICD-10-CM

## 2011-11-27 DIAGNOSIS — E7800 Pure hypercholesterolemia, unspecified: Secondary | ICD-10-CM

## 2011-11-27 DIAGNOSIS — I82409 Acute embolism and thrombosis of unspecified deep veins of unspecified lower extremity: Secondary | ICD-10-CM

## 2011-11-27 DIAGNOSIS — M1990 Unspecified osteoarthritis, unspecified site: Secondary | ICD-10-CM

## 2011-11-27 DIAGNOSIS — M109 Gout, unspecified: Secondary | ICD-10-CM

## 2011-11-27 DIAGNOSIS — I10 Essential (primary) hypertension: Secondary | ICD-10-CM

## 2011-11-27 DIAGNOSIS — J984 Other disorders of lung: Secondary | ICD-10-CM

## 2011-11-27 MED ORDER — FENOFIBRATE 160 MG PO TABS: 160.0000 mg | ORAL_TABLET | Freq: Every day | ORAL | Status: DC

## 2011-11-27 NOTE — Patient Instructions (Addendum)
Today we updated your med list in our EPIC system...    Continue your current medications the same...  We reviewed your recent blood work & gave you a copy for your records...  We decided to start FENOFIBRATE for your Lipids/ Triglycerides...    Take one 160mg  tab daily...    We will recheck your lipid profile on return...  Call for any questions...  Let's plan a recheck in 6 months, sooner if needed for problems.Marland KitchenMarland Kitchen

## 2011-11-27 NOTE — Progress Notes (Addendum)
Subjective:    Patient ID: [facility], male    DOB: January 15, 1941, 71 y.o.   MRN: [id number]  HPI 71 y/o BM here for a follow up visit... he has mult med prob as noted below...  ~  May 13, 2010:  he hasn't had any gout or arthritic problems since getting back on the Allopurinol... he notes BP controlled on the Toprol/ Norvasc & he exercises on a treadmill for 15-20 min doing OK... he remains on coumadin followed by DrGolding in [place]... Lipids look good on diet + Simva40/ FishOil... but his DM control has not been adeq on current doses of Metform/ Glimep> BS at home runs up to 180 he says & labs today showed BS113, A1c=9.0.Marland KitchenMarland Kitchen we discussed diet & exercise & rec to double current meds> incr Metform500- 2Bid, and Glimep2mg Qam... his wt is unchanged at 232# & states his diet consists of "cutting back on sugar", & "not eating much bread"> we discussed this.  ~  November 17, 2010:  37mo ROV & doing well overall; he wants new DM meter & we provided one from the office today...    He continues on Coumadin for his hx DVT w/ protimes managed by DrGolding in [place], stable on 5mg  tabs alternating 1 tab w/ 1.5 tabs qod...    Known right base nodule= granuloma w/o change on serial CXRs; due for yearly f/u film today ==> clear, NAD...    BP controlled on Metoprolol & Amlodipine; BP= 112/70 & he is asymptomatic w/o CP, palpit, HA, dizzy, SOB, edema, etc...    Lipids controlled on Simva40 + diet & FishOil; FLPs have been wnl in recent yrs but current lab showed LDL 123 despite 7# wt loss to 225# today...    DM regulared w/ Metformin 500mg Bid & Glimep 2mg /d; Bs=120, A1c= 7.2; improved from previous...    Hx DJD/ Gout & no prob since starting on the Allopurinol rx... See Prob list below >>  ~  May 19, 2011:  48mo ROV & Gerrod was adm by Urology DrBorden for hematuria & abn Ucytology (on Coumadin per DrGolding) w/ prev neg CT Abd & office cysto; hosp cysto (w/ bladder bxs off Coumadin- all neg), retrograde  pyelogram (neg), he will continue to follow...     Known right base nodule> granuloma RLL w/o change on serial CXRs; yearly f/u film 9/12 ==> clear, NAD...    HBP> on Metop50Bid & Amlod10; BP= 126/70 & he is asymptomatic w/o CP, palpit, HA, dizzy, SOB, edema, etc...    Hx DVT> this occurred in 2007 & treated in [place] by [facility] I suggested to pt that he might be able to come off coumadin & to discuss w/ DrGolding...    Lipids> prev on Simva40 + diet & FishOil; he stopped Simva on his own ~88mo ago "I was stiff"; FLP shows TChol 196, TG 303, HDL 45, LDL 85; ok off Simva now but needs better low fat diet & weight reduction...    DM> on Metformin 500mg Bid & Glimep1mg /d; BS=112, A1c= 7.4; ?it doesn't look like he increased the Amaryl to 2mg  as suggested in 2012 & of course he didn't bring med bottles to review; we decided to keep the same & advised better diet & weight reduction...    GI> on Omep20 as needed & up to date on colon screening...    GU> eval for hematuria as above by DrBorden...    Hx DJD/ Gout> no prob since starting on the Allopurinol rx... LABS 3/13:  FLP- TG not at goal on diet alone;  Chems- BS=120 A1c=7.4  ~  November 27, 2011:  8mo ROV & Suren is generally stable- he has several somatic complaints- left leg feels like it wants to give out, lotion makes his foot itch, hx tinea pedis- treated...  He is still on Coumadin from [facility] in [place] for hx DVT;  Chol controlled on diet alone but TG are elev & he is suggested to start FENOFIBRATE 160mg /d;  DM control is satid on his Metform & Glimep (see below);  He saw Urology DrBorden 5/13> HxBPH & LUTS, s/p open simple prostatectomy 2002 by DrDavis, Hx hematuria- neg CT, cysto, biopsies 2/13;  Had surveillance cysto due to pos urine cytology- cysto neg...    We reviewed prob list, meds, xrays and labs> see below>> OK Flu shot today... LABS 9/13:  FLP- ok x TG=304 & he will start Feno160;  Chems- ok x BS=125, A1c=7.1;  CBC-  wnl;  TSH=1.19;  PSA= 0.84          PROBLEM LIST:    PULMONARY NODULE (ICD-518.89) - vague nodular opacity right base over diaphragm on old films... ~  1.7 cm benign granuloma... no change on serial CXR or CT's back to 2000. ~  CXR 2/10 is WNL- nodule not seen... ~  CXR 9/10 showed tort Ao, Cor WNL, lungs clear w/o nodule identified. ~  CXR 9/11 showed clear lungs, NAD.Marland Kitchen. ~  CXR 9/12 showed clear lungs, NAD...  HYPERTENSION (ICD-401.9) - controlled on TOPROL 50mg Bid and NORVASC 10mg /d... BP's at home in the 120-130/70 range, and measures 120/78 today- he denies HA, fatigue, visual changes, CP, palipit, dizziness, syncope, dyspnea, edema, etc. ~  he had a neg Cardiolite study in 10/04- no ischemia and EF=52%. ~  Aug09: eval DrWall for atyp CP & risk factors> Myoview 11/01/07 showed no evid for infarct, +diaph attenuation, EF sl reduced at 47%.  DEEP VENOUS THROMBOPHLEBITIS (ICD-453.40) - Dx'd by LMD in [place] in 3/07... [facility] and had pain/ swelling L calf... ultrasound at [place] + for DVT & Rx COUMADIN by DrGolding (protimes q month)- no recent problems, he will check w/ DrGolding about discontinuing the Coumadin. ~  3/12:  Pt remains on Coumadin 3yrs after his single episode unprovoked DVT left leg, followed by DrGolding... ~  3/13:  Pt remains on Coumadin & I have suggested that he discuss coming off this med w/ his LMD, DrGolding...  HYPERCHOLESTEROLEMIA (ICD-272.0) - controlled on SIMVASTATIN 40mg /d & FISH OIL...  ~  [place] 9/08 showed TChol 148, TG 200, HDL 30, LDL 78... on Advicor 500-20 at that time- keep same. ~  [place] 3/09 showed TChol 214, TG 236, HDL 44, LDL 114... on Advicor- rec change to Simvastatin40. ~  FLP 7/09 showed TChol 129, Tg 188, HDL 35, LDL 57... rec- keep same. ~  FLP 1/10 showed TChol 142, TG 165, HDL 35, LDL 74... stable on Simva40. ~  FLP 3/11 showed TChol 127, TG 147, HDL 34, LDL 64 ~  FLP 9/11 showed TChol 143, TG 151, HDL  36, LDL 77 ~  FLP 3/12 on Simva40 showed TChol 161, TG 103, HDL 43, LDL 98 ~  FLP 9/12 on Simva40 showed TChol 196, TG 119, HDL 49, LDL 123... Same med, better diet effort. ~  FLP 3/13 off Simva showed TChol 196, TG 303, HDL 45, LDL 85... Needs better low fat diet & wt reduction. ~  FLP 9/13 on diet alone  showed TChol 175, TG 304, HDL 38, LDL 70... rec to add FENOFIBRATE 160mg /d...  DM (ICD-250.00) - currently on METFORMIN 500mg Bid & GLIMEPIRIDE 1mg /d (NOTE: hx prev hypoglycemia from Glucovance). ~  labs 9/08 showed FBS=106 and HgA1C=6.5.Marland KitchenMarland Kitchen on Glucovance 1/2 Bid. ~  labs 3/09 showed BS= 99, HgA1c= 7.0.Marland KitchenMarland Kitchen on Glucovance 1/2 Qam only- rec to incr to Bid again. ~  labs 7/09 showed BS= 104, HgA1c= 6.8.Marland KitchenMarland Kitchen rec- keep same. ~  pt reports sugars too low ~50 at 11AM if he takes even 1/2 tab in AM, therefore he changed to 1/2 at dinner only... ~  labs 1/10 showed BS= 125, A1c= 7.0 ~  labs 9/10 showed BS= 102, A1c= 6.8 ~  labs 3/11 showed BS= 110, A1c= 7.0.Marland KitchenMarland Kitchen continue same meds, get wt down! ~  labs 9/11 showed BS= 129, A1c= 7.7.Marland KitchenMarland Kitchen rec change to METFORMIN ER 500mg  Qam & GLIMEPIRIDE 1mg  Qam. ~  labs 3/12 showed BS= 113, A1c= 9.0.Marland KitchenMarland Kitchen rec to double meds> Metform500Bid, Glim2mg AM (?he never incr the Amaryl) ~  Labs 9/12 showed BS= 120, A1c= 7.2.Marland KitchenMarland Kitchen Keep same (?on Glimep1mg ?) + low carb diet. ~  Labs 3/13 on Metform500Bid+Glim1 showed BS=120, A1c=7.4.Marland KitchenMarland Kitchen Keep same med, better diet, get wt down ~  Labs 9/13 on Metform500Bid+Glim1 showed BS=125, A1c=7.1  GERD (ICD-530.81) - takes OMEPRAZOLE 20mg  Prn for reflux symptoms... ~  neg colonoscopy by DrStark in 6/00, and again 8/08 ( x sm hem's)... f/u planned 10 yrs.  BENIGN PROSTATIC HYPERTROPHY, WITH OBSTRUCTION (ICD-600.01) - he sees DrDavis yearly- pt reports "PSA was good" (we don't have notes from him)... S/P open prostatectomy 1999 by [id number]... Hx prostatitis in past... uses Cialis for ED. ~  labs 3/11 showed BUN= 10, Crear= 1.2... PSA done by Urology. ~  labs  9/11 showed BUN= 11, Creat= 1.3, PSA= 1.13 ~  he had f/u Urology DrBorden- BPH/ LUTS, PSA screening- "it was good" per pt. ~  Labs 9/12 showed PSA= 1.23 ~  Labs 9/13 showed PSA= 0.84  RENAL CALCULUS (ICD-592.0) HEMATURIA >> on Coumadin per DrGolding in [place] & eval by DrBorden, Urology... ~  adm 2/13 by Urology DrBorden for hematuria & abn Ucytology (on Coumadin per DrGolding) w/ prev neg CT Abd & office cysto; hosp cysto (w/ bladder bxs off Coumadin- all neg), retrograde pyelogram (neg), he will continue to follow...  DEGENERATIVE JOINT DISEASE (ICD-715.90) - hx of pain on his right side and right elbow... prev Rx w/ Etodolac, Parafon, heat and elbow pad... refer to ortho if symptoms persist... now he uses OTC anti-inflamm Rx Prn.  GOUT (ICD-274.9) - states he had episode right elbow arthritis and gout Rx'd by DrSypher 4/09 w/ Colchicine & Allopurinol 100mg /d... ~  labs 7/09 show Uric 6.1.Marland KitchenMarland Kitchen OK on Allopurinol 100mg /d... ~  labs 1/10 showed Uric= 6.5 ~  9/11:  pt reports that he stopped Allopurinol earlier this yr & had recent gout attack per DrGolding Rx w/ Probenecid... he would like to restart the Allopurinol preventive Rx & I agree> ALLOPURINOL 300mg /d written.  CARPAL TUNNEL SYNDROME (ICD-354.0) - treated by DrSypher over the years for CTS and mult trigger fingers (w/ surg)...  KELOID (ICD-701.4)  Health Maintenance - pt had "bug bite" on leg 9/10 w/ Tetanus shot by DrGolding in [place]... prev PNEUMOVAX 2000 at age 50, therefore given f/u PNEUMOVAX in 2010 at age 40... he gets yearly flu vaccine each fall.   Past Surgical History  Procedure Date  . Prostatectomy 1999    Dr. Rosana Hoes  . Cosmetic surgery 2000  Keloid injections  . Ortho surgery on fingers 2005 & 2008    Dr. Lorelle Formosa finger release  . Cystoscopy with biopsy 04/14/2011    Procedure: CYSTOSCOPY WITH BIOPSY;  Surgeon: Dutch Gray, MD;  Location: [facility];  Service: Urology;  Laterality: N/A;  Bladder  Biopsies Biopsy of Prostatic Urethra    . Cystoscopy/retrograde/ureteroscopy 04/14/2011    Procedure: CYSTOSCOPY/RETROGRADE/URETEROSCOPY;  Surgeon: Dutch Gray, MD;  Location: [facility];  Service: Urology;  Laterality: Bilateral;  (Bil) RPG     Outpatient Encounter Prescriptions as of 11/27/2011  Medication Sig Dispense Refill  . ACCU-CHEK AVIVA PLUS test strip TEST TWICE DAILY.  100 each  11  . allopurinol (ZYLOPRIM) 300 MG tablet TAKE 1 TABLET BY MOUTH ONCE DAILY  30 tablet  12  . amLODipine (NORVASC) 10 MG tablet Take 1 tablet (10 mg total) by mouth daily. Takes daily am  90 tablet  3  . CIALIS 20 MG tablet TAKE AS DIRECTED BY DOCTOR FOR ERECTILE DYSFUNCTION  10 each  5  . glimepiride (AMARYL) 1 MG tablet Take 1 tablet (1 mg total) by mouth daily before breakfast.  90 tablet  3  . Lancets (ACCU-CHEK MULTICLIX) lancets TEST TWICE DAILY.  102 each  11  . metFORMIN (GLUCOPHAGE) 500 MG tablet Take 1 tablet (500 mg total) by mouth 2 (two) times daily with a meal.  180 tablet  3  . metoprolol (LOPRESSOR) 50 MG tablet Take 1 tablet (50 mg total) by mouth daily.  90 tablet  3  . Multiple Vitamin (MULITIVITAMIN WITH MINERALS) TABS Take 1 tablet by mouth daily. Mens One a Day.      Marland Kitchen omeprazole (PRILOSEC) 20 MG capsule TAKE ONE CAPSULE BY MOUTH ONCE DAILY AS NEEDED FOR STOMACH ACID  30 capsule  5  . Polyethyl Glycol-Propyl Glycol (SYSTANE OP) Apply 1 drop to eye as needed. For dry eyes.      . polyvinyl alcohol (LIQUIFILM TEARS) 1.4 % ophthalmic solution Place 1 drop into both eyes as needed. For dry eyes.      Marland Kitchen warfarin (COUMADIN) 5 MG tablet Take 5-7.5 mg by mouth daily. Take 1.5 tablets on Monday through Friday & 1 tablet on Saturday and Sunday.        Allergies  Allergen Reactions  . Ace Inhibitors Swelling    REACTION: angio edema (swelling of lips)    Current Medications, Allergies, Past Medical History, Past Surgical History, Family History, and Social History were reviewed in [employer] record.    Review of Systems        See HPI - all other systems neg except as noted...   The patient complains of dyspnea on exertion.  The patient denies anorexia, fever, weight loss, weight gain, vision loss, decreased hearing, hoarseness, chest pain, syncope, peripheral edema, prolonged cough, headaches, hemoptysis, abdominal pain, melena, hematochezia, severe indigestion/heartburn, hematuria, incontinence, muscle weakness, suspicious skin lesions, transient blindness, difficulty walking, depression, unusual weight change, abnormal bleeding, enlarged lymph nodes, and angioedema.     Objective:   Physical Exam     WD, WN, overwt 71 y/o BM in NAD...  GENERAL:  Alert & oriented; pleasant & cooperative... HEENT:  [place]/AT, EOM-wnl, PERRLA, EACs-wax, TMs-wnl, NOSE-clear, THROAT-clear & wnl, no angioedema. NECK:  Supple w/ full ROM; no JVD; normal carotid impulses w/o bruits; no thyromegaly or nodules palpated; no lymphadenopathy. CHEST:  Clear to P & A; without wheezes/ rales/ or rhonchi. HEART:  Regular Rhythm; without murmurs/ rubs/ or gallops. ABDOMEN:  Soft & nontender; normal bowel sounds; no organomegaly or masses detected. RECTAL:  prostate 3+, no nodules, stool heme neg... EXT:  without deformities, mild arthritic changes; no varicose veins/ venous insuffic/ or edema. NEURO:  CN's intact; motor testing normal; sensory testing normal; gait normal & balance OK. DERM:  Keloids noted...  RADIOLOGY DATA:  Reviewed in the EPIC EMR & discussed w/ the patient...  LABORATORY DATA:  Reviewed in the EPIC EMR & discussed w/ the patient...   Assessment & Plan:    Pulm Nodule>  Known 1.7cm benign granuloma right base above diaph seen on old CTs but not readily visible on plain films; CXR 9/12 looked good, clear, NAD.  HBP>  Controlled on BBlocker, CCB; tol well, continue same...  Hx DVT>  DrGolding has apparently chosen to continue the Coumadin; hx single episode left  leg DVT in 2007- Dx in [place] & followed there...  CHOL>  Off Simva40 on his own now due to "stiffness"; FLP w/ fair chol numbers but elev TG; he wants to try diet alone- advised better low fat & get wt down.  DM>  On Metformin Bid & Glimep1mg /d; A1c is the same & rec continue same meds but better diet & get wt down...  GI> GERD> on PPI as needed; had colon 2008, neg & f/u due 2018...  GU> BPH w/ open prostatectomy 1999 by DrDavis;  PSA remains WNL; he has remote hx kidney stone; new prob of Hematuria & ?abn cytology in office; Cysto, retrogrades & bladder bxs were all neg 2/13 per DrBorden & he continues to follow...  DJD/ Gout/ Hx CTS>  Stable on OTC anti-inflamm as needed and Allopurinol daily...  Keloid>  Aware, no change.Marland KitchenMarland Kitchen

## 2012-01-04 ENCOUNTER — Other Ambulatory Visit: Payer: Self-pay | Admitting: Pulmonary Disease

## 2012-03-04 ENCOUNTER — Other Ambulatory Visit: Payer: Self-pay | Admitting: Pulmonary Disease

## 2012-03-19 ENCOUNTER — Telehealth: Payer: Self-pay | Admitting: Pulmonary Disease

## 2012-03-19 MED ORDER — ACCU-CHEK MULTICLIX LANCETS MISC: Status: DC

## 2012-03-19 MED ORDER — GLUCOSE BLOOD VI STRP: ORAL_STRIP | Status: DC

## 2012-03-19 NOTE — Telephone Encounter (Signed)
i spoke with pt and is aware RX's have been sent to [facility]. Nothing further was needed.

## 2012-03-19 NOTE — Telephone Encounter (Signed)
[place], spoke with April.   They received rxs for test strips and lancets earlier today.  States Medicare will not accept the rxs for "use as directed."  Needs further clarification on the directions and dx code.  As pt is not insulin dependent, Medicare will only pay for testing supplies for qd.  Gave VO to April for both for qd with dx code of 250.  April verbalized understanding and states pt will receive 50 test strips to a bottle and 100 lancets per package.  Per April, this is what the pt has received in the past.  Nothing further needed.

## 2012-04-04 ENCOUNTER — Other Ambulatory Visit: Payer: Self-pay | Admitting: Pulmonary Disease

## 2012-04-07 ENCOUNTER — Other Ambulatory Visit: Payer: Self-pay | Admitting: Pulmonary Disease

## 2012-05-12 ENCOUNTER — Other Ambulatory Visit: Payer: Self-pay | Admitting: Pulmonary Disease

## 2012-05-27 ENCOUNTER — Ambulatory Visit: Payer: Medicare Other | Admitting: Pulmonary Disease

## 2012-06-07 ENCOUNTER — Telehealth: Payer: Self-pay | Admitting: Pulmonary Disease

## 2012-06-07 DIAGNOSIS — E7800 Pure hypercholesterolemia, unspecified: Secondary | ICD-10-CM

## 2012-06-07 DIAGNOSIS — E119 Type 2 diabetes mellitus without complications: Secondary | ICD-10-CM

## 2012-06-07 NOTE — Telephone Encounter (Signed)
Labs are in the computer for the pt.  i attempted to call but unable to leave a message.  Will try back later.

## 2012-06-07 NOTE — Telephone Encounter (Signed)
Left pt messaging advising will call once labs are int he system. Please advise SN thanks Last OV 11/27/11 Pending OV 06/12/12 for 6 month OV

## 2012-06-07 NOTE — Telephone Encounter (Signed)
Pt called back & states he wants to complete the labs on Monday, 06/10/12.  Satira Anis

## 2012-06-07 NOTE — Telephone Encounter (Signed)
Called and spoke with pt and he is aware of labs in the computer and pt will come in on Monday for these. Nothing further is needed.

## 2012-06-10 ENCOUNTER — Other Ambulatory Visit (INDEPENDENT_AMBULATORY_CARE_PROVIDER_SITE_OTHER): Payer: Medicare Other

## 2012-06-10 DIAGNOSIS — E119 Type 2 diabetes mellitus without complications: Secondary | ICD-10-CM

## 2012-06-10 DIAGNOSIS — E7800 Pure hypercholesterolemia, unspecified: Secondary | ICD-10-CM

## 2012-06-10 LAB — HEMOGLOBIN A1C: Hgb A1c MFr Bld: 8.3 % — ABNORMAL HIGH (ref 4.6–6.5)

## 2012-06-10 LAB — LDL CHOLESTEROL, DIRECT: Direct LDL: 147.5 mg/dL

## 2012-06-10 LAB — LIPID PANEL
Cholesterol: 214 mg/dL — ABNORMAL HIGH (ref 0–200)
Total CHOL/HDL Ratio: 5
VLDL: 28.6 mg/dL (ref 0.0–40.0)

## 2012-06-10 LAB — BASIC METABOLIC PANEL
Chloride: 100 mEq/L (ref 96–112)
Creatinine, Ser: 1.3 mg/dL (ref 0.4–1.5)

## 2012-06-12 ENCOUNTER — Ambulatory Visit (INDEPENDENT_AMBULATORY_CARE_PROVIDER_SITE_OTHER): Payer: Medicare Other | Admitting: Pulmonary Disease

## 2012-06-12 ENCOUNTER — Encounter: Payer: Self-pay | Admitting: Pulmonary Disease

## 2012-06-12 VITALS — BP 108/72 | HR 60 | Temp 98.0°F | Ht 72.0 in | Wt 227.2 lb

## 2012-06-12 DIAGNOSIS — I10 Essential (primary) hypertension: Secondary | ICD-10-CM

## 2012-06-12 DIAGNOSIS — N401 Enlarged prostate with lower urinary tract symptoms: Secondary | ICD-10-CM

## 2012-06-12 DIAGNOSIS — N200 Calculus of kidney: Secondary | ICD-10-CM

## 2012-06-12 DIAGNOSIS — K219 Gastro-esophageal reflux disease without esophagitis: Secondary | ICD-10-CM

## 2012-06-12 DIAGNOSIS — E119 Type 2 diabetes mellitus without complications: Secondary | ICD-10-CM

## 2012-06-12 DIAGNOSIS — M109 Gout, unspecified: Secondary | ICD-10-CM

## 2012-06-12 DIAGNOSIS — E7800 Pure hypercholesterolemia, unspecified: Secondary | ICD-10-CM

## 2012-06-12 DIAGNOSIS — L910 Hypertrophic scar: Secondary | ICD-10-CM

## 2012-06-12 DIAGNOSIS — M1990 Unspecified osteoarthritis, unspecified site: Secondary | ICD-10-CM

## 2012-06-12 MED ORDER — PRAVASTATIN SODIUM 40 MG PO TABS: 40.0000 mg | ORAL_TABLET | Freq: Every day | ORAL | Status: DC

## 2012-06-12 MED ORDER — GLIMEPIRIDE 2 MG PO TABS: 2.0000 mg | ORAL_TABLET | Freq: Every day | ORAL | Status: DC

## 2012-06-12 NOTE — Progress Notes (Signed)
Subjective:    Patient ID: Hector Rose, male    DOB: [birth year], 72 y.o.   MRN: [id number]  HPI 72 y/o BM here for a follow up visit... he has mult med prob as noted below...  ~  November 17, 2010:  12mo ROV & doing well overall; he wants new DM meter & we provided one from the office today...    He continues on Coumadin for his hx DVT w/ protimes managed by DrGolding in [place], stable on 5mg  tabs alternating 1 tab w/ 1.5 tabs qod...    Known right base nodule= granuloma w/o change on serial CXRs; due for yearly f/u film today ==> clear, NAD...    BP controlled on Metoprolol & Amlodipine; BP= 112/70 & he is asymptomatic w/o CP, palpit, HA, dizzy, SOB, edema, etc...    Lipids controlled on Simva40 + diet & FishOil; FLPs have been wnl in recent yrs but current lab showed LDL 123 despite 7# wt loss to 225# today...    DM regulared w/ Metformin 500mg Bid & Glimep 2mg /d; Bs=120, A1c= 7.2; improved from previous...    Hx DJD/ Gout & no prob since starting on the Allopurinol rx... See Prob list below >>  ~  May 19, 2011:  34mo ROV & Hector Rose was adm by Urology DrBorden for hematuria & abn Ucytology (on Coumadin per DrGolding) w/ prev neg CT Abd & office cysto; hosp cysto (w/ bladder bxs off Coumadin- all neg), retrograde pyelogram (neg), he will continue to follow...    Known right base nodule> granuloma RLL w/o change on serial CXRs; yearly f/u film 9/12 ==> clear, NAD...    HBP> on Metop50Bid & Amlod10; BP= 126/70 & he is asymptomatic w/o CP, palpit, HA, dizzy, SOB, edema, etc...    Hx DVT> this occurred in 2007 & treated in [place] by [place] [facility] I suggested to pt that he might be able to come off coumadin & to discuss w/ DrGolding...    Lipids> prev on Simva40 + diet & FishOil; he stopped Simva on his own ~79mo ago "I was stiff"; FLP shows TChol 196, TG 303, HDL 45, LDL 85; ok off Simva now but needs better low fat diet & weight reduction...    DM> on Metformin 500mg Bid & Glimep1mg /d; BS=112,  A1c= 7.4; ?it doesn't look like he increased the Amaryl to 2mg  as suggested in 2012 & of course he didn't bring med bottles to review; we decided to keep the same & advised better diet & weight reduction...    GI> on Omep20 as needed & up to date on colon screening...    GU> eval for hematuria as above by DrBorden...    Hx DJD/ Gout> no prob since starting on the Allopurinol rx... LABS 3/13:  FLP- TG not at goal on diet alone;  Chems- BS=120 A1c=7.4  ~  November 27, 2011:  47mo ROV & Hector Rose is generally stable- he has several somatic complaints- left leg feels like it wants to give out, lotion makes his foot itch, hx tinea pedis- treated...  He is still on Coumadin from [facility] in [place] for hx DVT;  Chol controlled on diet alone but TG are elev & he is suggested to start FENOFIBRATE 160mg /d;  DM control is satid on his Metform & Glimep (see below);  He saw Urology DrBorden 5/13> HxBPH & LUTS, s/p open simple prostatectomy 2002 by DrDavis, Hx hematuria- neg CT, cysto, biopsies 2/13;  Had surveillance cysto due to pos urine cytology- cysto neg.Marland KitchenMarland Kitchen  We reviewed prob list, meds, xrays and labs> see below>> OK Flu shot today... LABS 9/13:  FLP- ok x TG=304 & he will start Feno160;  Chems- ok x BS=125, A1c=7.1;  CBC- wnl;  TSH=1.19;  PSA= 0.84   ~  June 12, 2012:  92mo ROV & Hector Rose is c/o some left jaw pain "like it wants to lock-up on me", exam is neg & rec to use hot soaks/ take Mobic15 as needed (& Prilosec); if symptoms persist he will check w/ his dentist... We reviewed the following medical problems during today's office visit >> Note: he also sees DrGolding in [place]...    Known right base nodule> granuloma RLL w/o change on serial CXRs; last film 9/12 ==> clear, NAD; denies cough, sput, hemoptysis, SOB, etc...    HBP> on Metop50Bid & Amlod10; BP= 108/72 & he is asymptomatic w/o CP, palpit, HA, dizzy, SOB, edema, etc...    Hx DVT> this occurred in 2007 & treated in [place] by [place]-  since last [place] he had a conversation w/ DrGolding & they have stopped the Coumadin, doing well off this med so far...    Lipids> on Feno160 for TG~300 & diet; FLP 3/14 shows TChol 214, TG 143, HDL 44, LDL 148; Rec to add PRAVASTATIN40 + better diet!    DM> on Metformin 500mg Bid & Glimep1mg /d; BS=113, A1c= 8.3; Rec to INCR Glimep1mg => 2mg /d & advised better diet & weight reduction...    GI> on Omep20 as needed & up to date on colon screening...    GU> eval for hematuria by DrBorden; rechecked 12/13 & stable w/ clear urine, PSA=1.0, & felt to be doing well...    Hx DJD/ Gout> no prob since starting on the Allopurinol 300mg /d, & has Mobic15 for prn use per DrGolding... We reviewed prob list, meds, xrays and labs> see below for updates >>  LABS 3/14:  FLP- not at goals on Feno160 & rec to add Prav40;  Chems- fairBS=113 A1c=8.3 Cr=1.3            PROBLEM LIST:    PULMONARY NODULE (ICD-518.89) - vague nodular opacity right base over diaphragm on old films... ~  1.7 cm benign granuloma... no change on serial CXR or CT's back to 2000. ~  CXR 2/10 is WNL- nodule not seen... ~  CXR 9/10 showed tort Ao, Cor WNL, lungs clear w/o nodule identified. ~  CXR 9/11 showed clear lungs, NAD.Marland Kitchen. ~  CXR 9/12 showed clear lungs, NAD...  HYPERTENSION (ICD-401.9) - controlled on TOPROL 50mg Bid and NORVASC 10mg /d... BP's at home in the 120-130/70 range, and measures 120/78 today- he denies HA, fatigue, visual changes, CP, palipit, dizziness, syncope, dyspnea, edema, etc. ~  he had a neg Cardiolite study in 10/04- no ischemia and EF=52%. ~  Aug09: eval DrWall for atyp CP & risk factors> Myoview 11/01/07 showed no evid for infarct, +diaph attenuation, EF sl reduced at 47%. ~  EKG 1/13 showed NSR, rate67, WNL, NAD... ~  4/14: on Metop50Bid & Amlod10; BP= 108/72 & he is asymptomatic w/o CP, palpit, HA, dizzy, SOB, edema, etc.  DEEP VENOUS THROMBOPHLEBITIS (ICD-453.40) - Dx'd by LMD in [place], DrGolding in 3/07... [facility]  [facility] and had pain/ swelling L calf... ultrasound at [place] + for DVT & Rx COUMADIN by DrGolding (protimes q month)- no recent problems, he will check w/ DrGolding about discontinuing the Coumadin. ~  3/12:  Pt remains on Coumadin 23yrs after his single episode unprovoked DVT left leg, followed  by DrGolding... ~  3/13:  Pt remains on Coumadin & I have suggested that he discuss coming off this med w/ his LMD, DrGolding... ~  4/14:  DrGolding has stopped his Coumadin in the interval- doing well so far w/o recurrent DVT...  HYPERCHOLESTEROLEMIA (ICD-272.0) - controlled on SIMVASTATIN 40mg /d & FISH OIL...  ~  [place] 9/08 showed TChol 148, TG 200, HDL 30, LDL 78... on Advicor 500-20 at that time- keep same. ~  [place] 3/09 showed TChol 214, TG 236, HDL 44, LDL 114... on Advicor- rec change to Simvastatin40. ~  FLP 7/09 showed TChol 129, Tg 188, HDL 35, LDL 57... rec- keep same. ~  FLP 1/10 showed TChol 142, TG 165, HDL 35, LDL 74... stable on Simva40. ~  FLP 3/11 showed TChol 127, TG 147, HDL 34, LDL 64 ~  FLP 9/11 showed TChol 143, TG 151, HDL 36, LDL 77 ~  FLP 3/12 on Simva40 showed TChol 161, TG 103, HDL 43, LDL 98 ~  FLP 9/12 on Simva40 showed TChol 196, TG 119, HDL 49, LDL 123... Same med, better diet effort. ~  FLP 3/13 off Simva showed TChol 196, TG 303, HDL 45, LDL 85... Needs better low fat diet & wt reduction. ~  FLP 9/13 on diet alone showed TChol 175, TG 304, HDL 38, LDL 70... rec to add FENOFIBRATE 160mg /d... ~  [place] 3/14 on Feno160 showed TChol 214, TG 143, HDL 44, LDL 148... rec to add PRAVASTATIN40  DM (ICD-250.00) - currently on METFORMIN 500mg Bid & GLIMEPIRIDE 1mg /d (NOTE: hx prev hypoglycemia from Glucovance). ~  labs 9/08 showed FBS=106 and HgA1C=6.5.Marland KitchenMarland Kitchen on Glucovance 1/2 Bid. ~  labs 3/09 showed BS= 99, HgA1c= 7.0.Marland KitchenMarland Kitchen on Glucovance 1/2 Qam only- rec to incr to Bid again. ~  labs 7/09 showed BS= 104, HgA1c= 6.8.Marland KitchenMarland Kitchen rec- keep same. ~  pt reports sugars too low ~50 at  11AM if he takes even 1/2 tab in AM, therefore he changed to 1/2 at dinner only... ~  labs 1/10 showed BS= 125, A1c= 7.0 ~  labs 9/10 showed BS= 102, A1c= 6.8 ~  labs 3/11 showed BS= 110, A1c= 7.0.Marland KitchenMarland Kitchen continue same meds, get wt down! ~  labs 9/11 showed BS= 129, A1c= 7.7.Marland KitchenMarland Kitchen rec change to METFORMIN ER 500mg  Qam & GLIMEPIRIDE 1mg  Qam. ~  labs 3/12 showed BS= 113, A1c= 9.0.Marland KitchenMarland Kitchen rec to double meds> Metform500Bid, Glim2mg AM (?he never incr the Amaryl) ~  Labs 9/12 showed BS= 120, A1c= 7.2.Marland KitchenMarland Kitchen Keep same (?on Glimep1mg ?) + low carb diet. ~  Labs 3/13 on Metform500Bid+Glim1 showed BS=120, A1c=7.4.Marland KitchenMarland Kitchen Keep same med, better diet, get wt down ~  Labs 9/13 on Metform500Bid+Glim1 showed BS=125, A1c=7.1 ~  Labs 4/14 on Metform500Bid+Glim1 showed BS=113, A1c=8.3.Marland KitchenMarland Kitchen rec to incr Glimep2mg Qam  GERD (ICD-530.81) - takes OMEPRAZOLE 20mg  Prn for reflux symptoms... ~  neg colonoscopy by DrStark in 6/00, and again 8/08 ( x sm hem's)... f/u planned 10 yrs. ~  Abd Sonar 11/12 showed Fatty Liver, mild GB sludge w/o stones, overlying bowel gas...  BENIGN PROSTATIC HYPERTROPHY, WITH OBSTRUCTION (ICD-600.01) - he sees DrDavis yearly- pt reports "PSA was good" (we don't have notes from him)... S/P open prostatectomy 1999 by [id number]... Hx prostatitis in past... uses Cialis for ED. ~  labs 3/11 showed BUN= 10, Crear= 1.2... PSA done by Urology. ~  labs 9/11 showed BUN= 11, Creat= 1.3, PSA= 1.13 ~  he had f/u Urology DrBorden- BPH/ LUTS, PSA screening- "it was good" per pt. ~  Labs 9/12 showed PSA= 1.23 ~  Labs 9/13 showed PSA= 0.84 ~  12/13: he had f/u Urology DrBorden> BPH w/ prev prostatectomy 1999, Hx hematuria while on coumadin & abn urine cytology w/ neg cysto; yearly f/u doing well...  RENAL CALCULUS (ICD-592.0) HEMATURIA >> on Coumadin per DrGolding in [place] & eval by DrBorden, Urology... ~  adm 2/13 by Urology DrBorden for hematuria & abn Ucytology (on Coumadin per DrGolding) w/ prev neg CT Abd & office cysto;  hosp cysto (w/ bladder bxs off Coumadin- all neg), retrograde pyelogram (neg), he will continue to follow... ~  No further hematuria & UA in office all neg w/o blood; he continues to f/u w/ DrBorden...  DEGENERATIVE JOINT DISEASE (ICD-715.90) - hx of pain on his right side and right elbow... prev Rx w/ Etodolac, Parafon, heat and elbow pad... refer to ortho if symptoms persist... now he uses OTC anti-inflamm Rx Prn.  GOUT (ICD-274.9) - states he had episode right elbow arthritis and gout Rx'd by DrSypher 4/09 w/ Colchicine & Allopurinol 100mg /d... ~  labs 7/09 show Uric 6.1.Marland KitchenMarland Kitchen OK on Allopurinol 100mg /d... ~  labs 1/10 showed Uric= 6.5 ~  9/11:  pt reports that he stopped Allopurinol earlier this yr & had recent gout attack per DrGolding Rx w/ Probenecid... he would like to restart the Allopurinol preventive Rx & I agree> ALLOPURINOL 300mg /d written. ~  No further gout attacks on the Allopurinol 300mg /d...  CARPAL TUNNEL SYNDROME (ICD-354.0) - treated by DrSypher over the years for CTS and mult trigger fingers (w/ surg)...  KELOID (ICD-701.4)  Health Maintenance - pt had "bug bite" on leg 9/10 w/ Tetanus shot by DrGolding in [place]... prev PNEUMOVAX 2000 at age 45, therefore given f/u PNEUMOVAX in 2010 at age 73... he gets yearly flu vaccine each fall.   Past Surgical History  Procedure Laterality Date  . Prostatectomy  1999    Dr. Rosana Hoes  . Cosmetic surgery  2000    Keloid injections  . Ortho surgery on fingers  2005 & 2008    Dr. Lorelle Formosa finger release  . Cystoscopy with biopsy  04/14/2011    Procedure: CYSTOSCOPY WITH BIOPSY;  Surgeon: Dutch Gray, MD;  Location: [facility];  Service: Urology;  Laterality: N/A;  Bladder Biopsies Biopsy of Prostatic Urethra    . Cystoscopy/retrograde/ureteroscopy  04/14/2011    Procedure: CYSTOSCOPY/RETROGRADE/URETEROSCOPY;  Surgeon: Dutch Gray, MD;  Location: [facility];  Service: Urology;  Laterality: Bilateral;  (Bil) RPG     Outpatient Encounter  Prescriptions as of 06/12/2012  Medication Sig Dispense Refill  . allopurinol (ZYLOPRIM) 300 MG tablet TAKE 1 TABLET BY MOUTH ONCE DAILY  30 tablet  6  . amLODipine (NORVASC) 10 MG tablet TAKE 1 TABLET BY MOUTH EVERY MORNING  90 tablet  3  . fenofibrate 160 MG tablet Take 1 tablet (160 mg total) by mouth daily.  90 tablet  3  . glimepiride (AMARYL) 1 MG tablet TAKE 1 TABLET EVERY MORNING  30 tablet  6  . glucose blood (ACCU-CHEK AVIVA PLUS) test strip Use as instructed once daily  50 each  11  . Lancets (ACCU-CHEK MULTICLIX) lancets Use as instructed once daily  100 each  11  . metFORMIN (GLUCOPHAGE) 500 MG tablet Take 1 tablet (500 mg total) by mouth 2 (two) times daily with a meal.  180 tablet  3  . metoprolol (LOPRESSOR) 50 MG tablet TAKE 1 TABLET BY MOUTH TWO TIMES A DAY  60 tablet  6  . Multiple Vitamin (MULITIVITAMIN WITH MINERALS) TABS Take 1 tablet by  mouth daily. Mens One a Day.      Marland Kitchen omeprazole (PRILOSEC) 20 MG capsule TAKE ONE CAPSULE BY MOUTH ONCE DAILY AS NEEDED FOR STOMACH ACID  30 capsule  5  . Polyethyl Glycol-Propyl Glycol (SYSTANE OP) Apply 1 drop to eye as needed. For dry eyes.      . polyvinyl alcohol (LIQUIFILM TEARS) 1.4 % ophthalmic solution Place 1 drop into both eyes as needed. For dry eyes.      . [DISCONTINUED] CIALIS 20 MG tablet TAKE AS DIRECTED BY DOCTOR FOR ERECTILE DYSFUNCTION  10 each  5  . [DISCONTINUED] glimepiride (AMARYL) 1 MG tablet Take 1 tablet (1 mg total) by mouth daily before breakfast.  90 tablet  3  . [DISCONTINUED] metFORMIN (GLUCOPHAGE) 500 MG tablet TAKE 1 TABLET BY MOUTH TWO TIMES A DAY  180 tablet  3  . [DISCONTINUED] warfarin (COUMADIN) 5 MG tablet Take 5-7.5 mg by mouth daily. Take 1.5 tablets on Monday through Friday & 1 tablet on Saturday and Sunday.       No facility-administered encounter medications on file as of 06/12/2012.    Allergies  Allergen Reactions  . Ace Inhibitors Swelling    REACTION: angio edema (swelling of lips)     Current Medications, Allergies, Past Medical History, Past Surgical History, Family History, and Social History were reviewed in [employer] record.    Review of Systems        See HPI - all other systems neg except as noted...   The patient complains of dyspnea on exertion.  The patient denies anorexia, fever, weight loss, weight gain, vision loss, decreased hearing, hoarseness, chest pain, syncope, peripheral edema, prolonged cough, headaches, hemoptysis, abdominal pain, melena, hematochezia, severe indigestion/heartburn, hematuria, incontinence, muscle weakness, suspicious skin lesions, transient blindness, difficulty walking, depression, unusual weight change, abnormal bleeding, enlarged lymph nodes, and angioedema.     Objective:   Physical Exam     WD, WN, overwt 72 y/o BM in NAD...  GENERAL:  Alert & oriented; pleasant & cooperative... HEENT:  /AT, EOM-wnl, PERRLA, EACs-wax, TMs-wnl, NOSE-clear, THROAT-clear & wnl, no angioedema. NECK:  Supple w/ full ROM; no JVD; normal carotid impulses w/o bruits; no thyromegaly or nodules palpated; no lymphadenopathy. CHEST:  Clear to P & A; without wheezes/ rales/ or rhonchi. HEART:  Regular Rhythm; without murmurs/ rubs/ or gallops. ABDOMEN:  Soft & nontender; normal bowel sounds; no organomegaly or masses detected. RECTAL:  prostate 3+, no nodules, stool heme neg... EXT:  without deformities, mild arthritic changes; no varicose veins/ venous insuffic/ or edema. NEURO:  CN's intact; motor testing normal; sensory testing normal; gait normal & balance OK. DERM:  Keloids noted...  RADIOLOGY DATA:  Reviewed in the EPIC EMR & discussed w/ the patient...  LABORATORY DATA:  Reviewed in the EPIC EMR & discussed w/ the patient...   Assessment & Plan:    Pulm Nodule>  Known 1.7cm benign granuloma right base above diaph seen on old CTs but not readily visible on plain films; CXR 9/12 looked good, clear, NAD.  HBP>   Controlled on BBlocker, CCB; tol well, continue same...  Hx DVT>  DrGolding has stopped the Coumadin; hx single episode left leg DVT in 2007- Dx in [place] & followed there, no recurrence...  CHOL>  Off Simva40 on his own now due to "stiffness"; FLP w/ fair chol numbers but elev TG=> started Feno160; FLP w/ improved TG but elev Tchol/LDL & rec to try PRAV40...  DM>  On Metformin Bid &  Glimep1mg /d; A1c is up to 8.3; rec to incr Glim to 2mg Qam along w/ better diet & get wt down...  GI> GERD> on PPI as needed; had colon 2008, neg & f/u due 2018...  GU> BPH w/ open prostatectomy 1999 by DrDavis;  PSA remains WNL; he has remote hx kidney stone; prob of Hematuria & ?abn cytology in office; Cysto, retrogrades & bladder bxs were all neg 2/13 per DrBorden & he continues to follow...  DJD/ Gout/ Hx CTS>  Stable on OTC anti-inflamm as needed and Allopurinol daily...  Keloid>  Aware, no change...   Patient's Medications  New Prescriptions   GLIMEPIRIDE (AMARYL) 2 MG TABLET    Take 1 tablet (2 mg total) by mouth daily before breakfast.   PRAVASTATIN (PRAVACHOL) 40 MG TABLET    Take 1 tablet (40 mg total) by mouth daily.  Previous Medications   AMLODIPINE (NORVASC) 10 MG TABLET    TAKE 1 TABLET BY MOUTH EVERY MORNING   FENOFIBRATE 160 MG TABLET    Take 1 tablet (160 mg total) by mouth daily.   GLUCOSE BLOOD (ACCU-CHEK AVIVA PLUS) TEST STRIP    Use as instructed once daily   LANCETS (ACCU-CHEK MULTICLIX) LANCETS    Use as instructed once daily   METFORMIN (GLUCOPHAGE) 500 MG TABLET    Take 1 tablet (500 mg total) by mouth 2 (two) times daily with a meal.   METOPROLOL (LOPRESSOR) 50 MG TABLET    TAKE 1 TABLET BY MOUTH TWO TIMES A DAY   MULTIPLE VITAMIN (MULITIVITAMIN WITH MINERALS) TABS    Take 1 tablet by mouth daily. Mens One a Day.   OMEPRAZOLE (PRILOSEC) 20 MG CAPSULE    TAKE ONE CAPSULE BY MOUTH ONCE DAILY AS NEEDED FOR STOMACH ACID   POLYETHYL GLYCOL-PROPYL GLYCOL (SYSTANE OP)    Apply 1 drop  to eye as needed. For dry eyes.   POLYVINYL ALCOHOL (LIQUIFILM TEARS) 1.4 % OPHTHALMIC SOLUTION    Place 1 drop into both eyes as needed. For dry eyes.  Modified Medications   Modified Medication Previous Medication   ALLOPURINOL (ZYLOPRIM) 300 MG TABLET allopurinol (ZYLOPRIM) 300 MG tablet      TAKE 1 TABLET BY MOUTH ONCE DAILY    TAKE 1 TABLET BY MOUTH ONCE DAILY  Discontinued Medications   CIALIS 20 MG TABLET    TAKE AS DIRECTED BY DOCTOR FOR ERECTILE DYSFUNCTION   GLIMEPIRIDE (AMARYL) 1 MG TABLET    Take 1 tablet (1 mg total) by mouth daily before breakfast.   GLIMEPIRIDE (AMARYL) 1 MG TABLET    TAKE 1 TABLET EVERY MORNING   METFORMIN (GLUCOPHAGE) 500 MG TABLET    TAKE 1 TABLET BY MOUTH TWO TIMES A DAY   WARFARIN (COUMADIN) 5 MG TABLET    Take 5-7.5 mg by mouth daily. Take 1.5 tablets on Monday through Friday & 1 tablet on Saturday and Sunday.

## 2012-06-12 NOTE — Patient Instructions (Addendum)
Today we updated your med list in our EPIC system...     We decided to INCREASE the GLIMEPIRIDE to 2mg  each AM for your diabetes...  We are also recommending to start PRAVASTATIN 40mg  at bedtime for your Cholesterol...  Let's get on track w/ our low carb, low fat diet 7 get the weight down...    Check into a Mediterranean type diet & foods w/ a LOW GLYCEMIC INDEX...  Call for any questions...  Let's plan a follow up visit in 22mo, sooner if needed for problems.Marland KitchenMarland Kitchen

## 2012-07-30 ENCOUNTER — Other Ambulatory Visit: Payer: Self-pay | Admitting: Pulmonary Disease

## 2012-10-29 ENCOUNTER — Other Ambulatory Visit: Payer: Self-pay | Admitting: Pulmonary Disease

## 2012-11-21 ENCOUNTER — Other Ambulatory Visit: Payer: Self-pay | Admitting: Pulmonary Disease

## 2012-12-13 ENCOUNTER — Ambulatory Visit: Payer: Medicare Other | Admitting: Pulmonary Disease

## 2012-12-30 ENCOUNTER — Encounter: Payer: Self-pay | Admitting: Pulmonary Disease

## 2012-12-30 ENCOUNTER — Ambulatory Visit (INDEPENDENT_AMBULATORY_CARE_PROVIDER_SITE_OTHER)
Admission: RE | Admit: 2012-12-30 | Discharge: 2012-12-30 | Disposition: A | Discharge status: Home / Self Care | Payer: Medicare Other | Source: Ambulatory Visit | Attending: Pulmonary Disease | Admitting: Pulmonary Disease

## 2012-12-30 ENCOUNTER — Ambulatory Visit (INDEPENDENT_AMBULATORY_CARE_PROVIDER_SITE_OTHER): Payer: Medicare Other | Admitting: Pulmonary Disease

## 2012-12-30 VITALS — BP 124/70 | HR 66 | Temp 98.4°F | Ht 72.0 in | Wt 232.0 lb

## 2012-12-30 DIAGNOSIS — E7800 Pure hypercholesterolemia, unspecified: Secondary | ICD-10-CM

## 2012-12-30 DIAGNOSIS — I10 Essential (primary) hypertension: Secondary | ICD-10-CM

## 2012-12-30 DIAGNOSIS — N200 Calculus of kidney: Secondary | ICD-10-CM

## 2012-12-30 DIAGNOSIS — N401 Enlarged prostate with lower urinary tract symptoms: Secondary | ICD-10-CM

## 2012-12-30 DIAGNOSIS — M1990 Unspecified osteoarthritis, unspecified site: Secondary | ICD-10-CM

## 2012-12-30 DIAGNOSIS — E119 Type 2 diabetes mellitus without complications: Secondary | ICD-10-CM

## 2012-12-30 DIAGNOSIS — K219 Gastro-esophageal reflux disease without esophagitis: Secondary | ICD-10-CM

## 2012-12-30 DIAGNOSIS — M109 Gout, unspecified: Secondary | ICD-10-CM

## 2012-12-30 DIAGNOSIS — N138 Other obstructive and reflux uropathy: Secondary | ICD-10-CM

## 2012-12-30 DIAGNOSIS — L910 Hypertrophic scar: Secondary | ICD-10-CM

## 2012-12-30 DIAGNOSIS — F419 Anxiety disorder, unspecified: Secondary | ICD-10-CM

## 2012-12-30 NOTE — Progress Notes (Signed)
Subjective:    Patient ID: Hector Rose, male    DOB: [birth year], 72 y.o.   MRN: [id number]  HPI 72 y/o BM here for a follow up visit... he has mult med prob as noted below...  ~  May 19, 2011:  63mo ROV & Hector Rose was adm by Urology DrBorden for hematuria & abn Ucytology (on Coumadin per DrGolding) w/ prev neg CT Abd & office cysto; hosp cysto (w/ bladder bxs off Coumadin- all neg), retrograde pyelogram (neg), he will continue to follow...    Known right base nodule> granuloma RLL w/o change on serial CXRs; yearly f/u film 9/12 ==> clear, NAD...    HBP> on Metop50Bid & Amlod10; BP= 126/70 & he is asymptomatic w/o CP, palpit, HA, dizzy, SOB, edema, etc...    Hx DVT> this occurred in 2007 & treated in [place] by [place] [facility] I suggested to pt that he might be able to come off coumadin & to discuss w/ DrGolding...    Lipids> prev on Simva40 + diet & FishOil; he stopped Simva on his own ~41mo ago "I was stiff"; FLP shows TChol 196, TG 303, HDL 45, LDL 85; ok off Simva now but needs better low fat diet & weight reduction...    DM> on Metformin 500mg Bid & Glimep1mg /d; BS=112, A1c= 7.4; ?it doesn't look like he increased the Amaryl to 2mg  as suggested in 2012 & of course he didn't bring med bottles to review; we decided to keep the same & advised better diet & weight reduction...    GI> on Omep20 as needed & up to date on colon screening...    GU> eval for hematuria as above by DrBorden...    Hx DJD/ Gout> no prob since starting on the Allopurinol rx...  LABS 3/13:  FLP- TG not at goal on diet alone;  Chems- BS=120 A1c=7.4  ~  November 27, 2011:  102mo ROV & Hector Rose is generally stable- he has several somatic complaints- left leg feels like it wants to give out, lotion makes his foot itch, hx tinea pedis- treated...  He is still on Coumadin from [facility] in [place] for hx DVT;  Chol controlled on diet alone but TG are elev & he is suggested to start FENOFIBRATE 160mg /d;  DM control is satid on his  Metform & Glimep (see below);  He saw Urology DrBorden 5/13> HxBPH & LUTS, s/p open simple prostatectomy 2002 by DrDavis, Hx hematuria- neg CT, cysto, biopsies 2/13;  Had surveillance cysto due to pos urine cytology- cysto neg...    We reviewed prob list, meds, xrays and labs> see below>> OK Flu shot today...  LABS 9/13:  FLP- ok x TG=304 & he will start Feno160;  Chems- ok x BS=125, A1c=7.1;  CBC- wnl;  TSH=1.19;  PSA= 0.84   ~  June 12, 2012:  61mo ROV & Hector Rose is c/o some left jaw pain "like it wants to lock-up on me", exam is neg & rec to use hot soaks/ take Mobic15 as needed (& Prilosec); if symptoms persist he will check w/ his dentist... We reviewed the following medical problems during today's office visit >> Note: he also sees DrGolding in [place]...    Known right base nodule> granuloma RLL w/o change on serial CXRs; last film 9/12 ==> clear, NAD; denies cough, sput, hemoptysis, SOB, etc...    HBP> on Metop50Bid & Amlod10; BP= 108/72 & he is asymptomatic w/o CP, palpit, HA, dizzy, SOB, edema, etc...    Hx DVT> this occurred in 2007 &  treated in [place] by [facility]- since last OV he had a conversation w/ DrGolding & they have stopped the Coumadin, doing well off this med so far...    Lipids> on Feno160 for TG~300 & diet; FLP 3/14 shows TChol 214, TG 143, HDL 44, LDL 148; Rec to add PRAVASTATIN40 + better diet!    DM> on Metformin 500mg Bid & Glimep1mg /d; BS=113, A1c= 8.3; Rec to INCR Glimep1mg => 2mg /d & advised better diet & weight reduction...    GI> on Omep20 as needed & up to date on colon screening...    GU> eval for hematuria by DrBorden; rechecked 12/13 & stable w/ clear urine, PSA=1.0, & felt to be doing well...    Hx DJD/ Gout> no prob since starting on the Allopurinol 300mg /d, & has Mobic15 for prn use per DrGolding... We reviewed prob list, meds, xrays and labs> see below for updates >>   LABS 3/14:  FLP- not at goals on Feno160 & rec to add Prav40;  Chems- fairBS=113 A1c=8.3  Cr=1.3   ~  December 30, 2012:  68mo ROV & Hector Rose has noted that his BS has been running 150-200 at home- weight is up 5# despite diet efforts, on Metform & Glimep, we reviewed low carb diet & "the glycemic index"... We reviewed the following medical problems during today's office visit >>     Known right base nodule> granuloma RLL w/o change on serial CXRs; CXR 10/14 => NAD; he denies cough, sput, hemoptysis, SOB, etc...    HBP> on Metop50Bid & Amlod10; BP= 124/70 & he is asymptomatic w/o CP, palpit, HA, dizzy, SOB, edema, etc...    Hx DVT> this occurred in 2007 & treated in [place] by [facility]- off Coumadin now per DrGolding & doing well off this med so far...    Lipids> on Prav40, CoQ10, & Feno160 for TG~300 & diet; FLP 10/14 shows TChol 171, TG 130, HDL 45, LDL 100; numbers improved on meds, now needs to lose wt!    DM> on Metformin 500mg Bid & Glimep2mg /d; BS=153, A1c= 8.6; Rec to INCR Metform2Bid & Glimep4mg /d...    GI> on Omep20 as needed & up to date on colon screening...    GU> eval for hematuria by DrBorden; rechecked 12/13 & stable w/ clear urine, PSA=0.86, & felt to be doing well, requests Viagra...    Hx DJD/ Gout> no prob since starting on the Allopurinol 300mg /d, & uses OTC analgesics as needed... We reviewed prob list, meds, xrays and labs> see below for updates >>   CXR 10/14 showed stable heart size, clear lungs x right base scarring, mild degen changes in Tspine, NAD...  LABS 10/14:  FLP- at goals on Prav40+Feno160;  Chems- ok x BS=153, A1C=8.6;  CBC=wnl;  TSH=1.74;  PSA=0.86...            PROBLEM LIST:    PULMONARY NODULE (ICD-518.89) - vague nodular opacity right base over diaphragm on old films... ~  1.7 cm benign granuloma... no change on serial CXR or CT's back to 2000. ~  CXR 2/10 is WNL- nodule not seen... ~  CXR 9/10 showed tort Ao, Cor WNL, lungs clear w/o nodule identified. ~  CXR 9/11 showed clear lungs, NAD.Marland Kitchen. ~  CXR 9/12 showed clear lungs, NAD.Marland Kitchen. ~  CXR  10/14 showed stable heart size, clear lungs x right base scarring, mild degen changes in Tspine, NAD...  HYPERTENSION (ICD-401.9) - controlled on TOPROL 50mg Bid and NORVASC 10mg /d... BP's at home in the 120-130/70 range, and measures 120/78 today- he denies HA,  fatigue, visual changes, CP, palipit, dizziness, syncope, dyspnea, edema, etc. ~  he had a neg Cardiolite study in 10/04- no ischemia and EF=52%. ~  Aug09: eval DrWall for atyp CP & risk factors> Myoview 11/01/07 showed no evid for infarct, +diaph attenuation, EF sl reduced at 47%. ~  EKG 1/13 showed NSR, rate67, WNL, NAD... ~  4/14: on Metop50Bid & Amlod10; BP= 108/72 & he is asymptomatic w/o CP, palpit, HA, dizzy, SOB, edema, etc. ~  10/14: on Metop50Bid & Amlod10; BP= 124/70 & he remains essentially asymptomatic...  DEEP VENOUS THROMBOPHLEBITIS (ICD-453.40) - Dx'd by LMD in [place], [place] in 3/07... [facility] and had pain/ swelling L calf... ultrasound at [facility] + for DVT & Rx COUMADIN by DrGolding (protimes q month)- no recent problems, he will check w/ DrGolding about discontinuing the Coumadin. ~  3/12:  Pt remains on Coumadin 43yrs after his single episode unprovoked DVT left leg, followed by DrGolding... ~  3/13:  Pt remains on Coumadin & I have suggested that he discuss coming off this med w/ his LMD, DrGolding... ~  4/14:  DrGolding has stopped his Coumadin in the interval- doing well so far w/o recurrent DVT...  HYPERCHOLESTEROLEMIA (ICD-272.0) - controlled on SIMVASTATIN 40mg /d & FISH OIL...  ~  [place] 9/08 showed TChol 148, TG 200, HDL 30, LDL 78... on Advicor 500-20 at that time- keep same. ~  [place] 3/09 showed TChol 214, TG 236, HDL 44, LDL 114... on Advicor- rec change to Simvastatin40. ~  FLP 7/09 showed TChol 129, Tg 188, HDL 35, LDL 57... rec- keep same. ~  FLP 1/10 showed TChol 142, TG 165, HDL 35, LDL 74... stable on Simva40. ~  FLP 3/11 showed TChol 127, TG 147, HDL 34, LDL 64 ~  FLP 9/11  showed TChol 143, TG 151, HDL 36, LDL 77 ~  FLP 3/12 on Simva40 showed TChol 161, TG 103, HDL 43, LDL 98 ~  FLP 9/12 on Simva40 showed TChol 196, TG 119, HDL 49, LDL 123... Same med, better diet effort. ~  FLP 3/13 off Simva showed TChol 196, TG 303, HDL 45, LDL 85... Needs better low fat diet & wt reduction. ~  FLP 9/13 on diet alone showed TChol 175, TG 304, HDL 38, LDL 70... rec to add FENOFIBRATE 160mg /d... ~  [place] 3/14 on Feno160 showed TChol 214, TG 143, HDL 44, LDL 148... rec to add PRAVASTATIN40 ~  FLP 10/14 on Prav40+Feno160 showed TChol 171, TG 130, HDL 45, LDL 100  DM (ICD-250.00) - currently on METFORMIN 500mg Bid & GLIMEPIRIDE 1mg /d (NOTE: hx prev hypoglycemia from Glucovance). ~  labs 9/08 showed FBS=106 and HgA1C=6.5.Marland KitchenMarland Kitchen on Glucovance 1/2 Bid. ~  labs 3/09 showed BS= 99, HgA1c= 7.0.Marland KitchenMarland Kitchen on Glucovance 1/2 Qam only- rec to incr to Bid again. ~  labs 7/09 showed BS= 104, HgA1c= 6.8.Marland KitchenMarland Kitchen rec- keep same. ~  pt reports sugars too low ~50 at 11AM if he takes even 1/2 tab in AM, therefore he changed to 1/2 at dinner only... ~  labs 1/10 showed BS= 125, A1c= 7.0 ~  labs 9/10 showed BS= 102, A1c= 6.8 ~  labs 3/11 showed BS= 110, A1c= 7.0.Marland KitchenMarland Kitchen continue same meds, get wt down! ~  labs 9/11 showed BS= 129, A1c= 7.7.Marland KitchenMarland Kitchen rec change to METFORMIN ER 500mg  Qam & GLIMEPIRIDE 1mg  Qam. ~  labs 3/12 showed BS= 113, A1c= 9.0.Marland KitchenMarland Kitchen rec to double meds> Metform500Bid, Glim2mg AM (?he never incr the Amaryl) ~  Labs 9/12 showed BS= 120,  A1c= 7.2.Marland KitchenMarland Kitchen Keep same (?on Glimep1mg ?) + low carb diet. ~  Labs 3/13 on Metform500Bid+Glim1 showed BS=120, A1c=7.4.Marland KitchenMarland Kitchen Keep same med, better diet, get wt down ~  Labs 9/13 on Metform500Bid+Glim1 showed BS=125, A1c=7.1 ~  Labs 4/14 on Metform500Bid+Glim1 showed BS=113, A1c=8.3.Marland KitchenMarland Kitchen rec to incr Glimep2mg Qam ~  Labs 10/14 on Metform500Bid+Glim2 showed BS=153, A1c=8.6.Marland KitchenMarland Kitchen rec incr Metform500-2Bid & Glim4Qam, + diet & exercise...  GERD (ICD-530.81) - takes OMEPRAZOLE 20mg  Prn for reflux  symptoms... ~  neg colonoscopy by DrStark in 6/00, and again 8/08 ( x sm hem's)... f/u planned 10 yrs. ~  Abd Sonar 11/12 showed Fatty Liver, mild GB sludge w/o stones, overlying bowel gas...  BENIGN PROSTATIC HYPERTROPHY, WITH OBSTRUCTION (ICD-600.01) - he sees DrDavis yearly- pt reports "PSA was good" (we don't have notes from him)... S/P open prostatectomy 1999 by [id number]... Hx prostatitis in past... uses Cialis for ED. ~  labs 3/11 showed BUN= 10, Crear= 1.2... PSA done by Urology. ~  labs 9/11 showed BUN= 11, Creat= 1.3, PSA= 1.13 ~  he had f/u Urology DrBorden- BPH/ LUTS, PSA screening- "it was good" per pt. ~  Labs 9/12 showed PSA= 1.23 ~  Labs 9/13 showed PSA= 0.84 ~  12/13: he had f/u Urology DrBorden> BPH w/ prev prostatectomy 1999, Hx hematuria while on coumadin & abn urine cytology w/ neg cysto; yearly f/u doing well... ~  10/14:  Labs here showed PSA= 0.86  RENAL CALCULUS (ICD-592.0) HEMATURIA >> on Coumadin per DrGolding in [place] & eval by DrBorden, Urology... ~  adm 2/13 by Urology DrBorden for hematuria & abn Ucytology (on Coumadin per DrGolding) w/ prev neg CT Abd & office cysto; hosp cysto (w/ bladder bxs off Coumadin- all neg), retrograde pyelogram (neg), he will continue to follow... ~  No further hematuria & UA in office all neg w/o blood; he continues to f/u w/ DrBorden...  DEGENERATIVE JOINT DISEASE (ICD-715.90) - hx of pain on his right side and right elbow... prev Rx w/ Etodolac, Parafon, heat and elbow pad... refer to ortho if symptoms persist... now he uses OTC anti-inflamm Rx Prn.  GOUT (ICD-274.9) - states he had episode right elbow arthritis and gout Rx'd by DrSypher 4/09 w/ Colchicine & Allopurinol 100mg /d... ~  labs 7/09 show Uric 6.1.Marland KitchenMarland Kitchen OK on Allopurinol 100mg /d... ~  labs 1/10 showed Uric= 6.5 ~  9/11:  pt reports that he stopped Allopurinol earlier this yr & had recent gout attack per DrGolding Rx w/ Probenecid... he would like to restart the  Allopurinol preventive Rx & I agree> ALLOPURINOL 300mg /d written. ~  No further gout attacks on the Allopurinol 300mg /d...  CARPAL TUNNEL SYNDROME (ICD-354.0) - treated by DrSypher over the years for CTS and mult trigger fingers (w/ surg)...  KELOID (ICD-701.4)  Health Maintenance - pt had "bug bite" on leg 9/10 w/ Tetanus shot by DrGolding in [place]... prev PNEUMOVAX 2000 at age 26, therefore given f/u PNEUMOVAX in 2010 at age 66... he gets yearly flu vaccine each fall.   Past Surgical History  Procedure Laterality Date  . Prostatectomy  1999    Dr. Rosana Hoes  . Cosmetic surgery  2000    Keloid injections  . Ortho surgery on fingers  2005 & 2008    Dr. Lorelle Formosa finger release  . Cystoscopy with biopsy  04/14/2011    Procedure: CYSTOSCOPY WITH BIOPSY;  Surgeon: Dutch Gray, MD;  Location: [facility];  Service: Urology;  Laterality: N/A;  Bladder Biopsies Biopsy of Prostatic Urethra    . Cystoscopy/retrograde/ureteroscopy  04/14/2011  Procedure: CYSTOSCOPY/RETROGRADE/URETEROSCOPY;  Surgeon: Dutch Gray, MD;  Location: [facility];  Service: Urology;  Laterality: Bilateral;  (Bil) RPG     Outpatient Encounter Prescriptions as of 12/30/2012  Medication Sig Dispense Refill  . allopurinol (ZYLOPRIM) 300 MG tablet TAKE 1 TABLET BY MOUTH ONCE DAILY  30 tablet  6  . amLODipine (NORVASC) 10 MG tablet TAKE 1 TABLET BY MOUTH EVERY MORNING  90 tablet  3  . fenofibrate 160 MG tablet TAKE 1 TABLET (160 MG TOTAL) BY MOUTH DAILY.  90 tablet  3  . glimepiride (AMARYL) 2 MG tablet Take 1 tablet (2 mg total) by mouth daily before breakfast.  30 tablet  11  . glucose blood (ACCU-CHEK AVIVA PLUS) test strip Use as instructed once daily  50 each  11  . Lancets (ACCU-CHEK MULTICLIX) lancets Use as instructed once daily  100 each  11  . metFORMIN (GLUCOPHAGE) 500 MG tablet Take 1 tablet (500 mg total) by mouth 2 (two) times daily with a meal.  180 tablet  3  . metoprolol (LOPRESSOR) 50 MG tablet TAKE 1 TABLET  BY MOUTH TWO TIMES A DAY  60 tablet  6  . Multiple Vitamin (MULITIVITAMIN WITH MINERALS) TABS Take 1 tablet by mouth daily. Mens One a Day.      Marland Kitchen omeprazole (PRILOSEC) 20 MG capsule TAKE ONE CAPSULE BY MOUTH ONCE DAILY AS NEEDED FOR STOMACH ACID  30 capsule  5  . Polyethyl Glycol-Propyl Glycol (SYSTANE OP) Apply 1 drop to eye as needed. For dry eyes.      . polyvinyl alcohol (LIQUIFILM TEARS) 1.4 % ophthalmic solution Place 1 drop into both eyes as needed. For dry eyes.      . pravastatin (PRAVACHOL) 40 MG tablet Take 1 tablet (40 mg total) by mouth daily.  30 tablet  11   No facility-administered encounter medications on file as of 12/30/2012.    Allergies  Allergen Reactions  . Ace Inhibitors Swelling    REACTION: angio edema (swelling of lips)    Current Medications, Allergies, Past Medical History, Past Surgical History, Family History, and Social History were reviewed in [employer] record.    Review of Systems        See HPI - all other systems neg except as noted...   The patient complains of dyspnea on exertion.  The patient denies anorexia, fever, weight loss, weight gain, vision loss, decreased hearing, hoarseness, chest pain, syncope, peripheral edema, prolonged cough, headaches, hemoptysis, abdominal pain, melena, hematochezia, severe indigestion/heartburn, hematuria, incontinence, muscle weakness, suspicious skin lesions, transient blindness, difficulty walking, depression, unusual weight change, abnormal bleeding, enlarged lymph nodes, and angioedema.     Objective:   Physical Exam     WD, WN, overwt 72 y/o BM in NAD...  GENERAL:  Alert & oriented; pleasant & cooperative... HEENT:  [place]/AT, EOM-wnl, PERRLA, EACs-wax, TMs-wnl, NOSE-clear, THROAT-clear & wnl, no angioedema. NECK:  Supple w/ full ROM; no JVD; normal carotid impulses w/o bruits; no thyromegaly or nodules palpated; no lymphadenopathy. CHEST:  Clear to P & A; without wheezes/ rales/ or  rhonchi. HEART:  Regular Rhythm; without murmurs/ rubs/ or gallops. ABDOMEN:  Soft & nontender; normal bowel sounds; no organomegaly or masses detected. RECTAL:  prostate 3+, no nodules, stool heme neg... EXT:  without deformities, mild arthritic changes; no varicose veins/ venous insuffic/ or edema. NEURO:  CN's intact; motor testing normal; sensory testing normal; gait normal & balance OK. DERM:  Keloids noted...  RADIOLOGY DATA:  Reviewed in  the EPIC EMR & discussed w/ the patient...  LABORATORY DATA:  Reviewed in the EPIC EMR & discussed w/ the patient...   Assessment & Plan:    Pulm Nodule>  Known 1.7cm benign granuloma right base above diaph seen on old CTs but not readily visible on plain films; CXR 10/14 looked good, clear, NAD.  HBP>  Controlled on BBlocker, CCB; tol well, continue same...  Hx DVT>  DrGolding has stopped the Coumadin; hx single episode left leg DVT in 2007- Dx in [place] & followed there, no recurrence...  CHOL>  On Prav40 + Feno160; tol ok w/ addition of CoQ10; FLP 10/14 is improved, now work on diet 7 get wt down!  DM>  On Metformin Bid & Glimep2mg /d; A1c is up to 8.6; rec to double meds- Metform500-2Bid & glimep4mg Qam...  GI> GERD> on PPI as needed; had colon 2008, neg & f/u due 2018...  GU> BPH w/ open prostatectomy 1999 by DrDavis;  PSA remains WNL; he has remote hx kidney stone; prob of Hematuria & ?abn cytology in office; Cysto, retrogrades & bladder bxs were all neg 2/13 per DrBorden & he continues to follow...  DJD/ Gout/ Hx CTS>  Stable on OTC anti-inflamm as needed and Allopurinol daily...  Keloid>  Aware, no change.Marland KitchenMarland Kitchen

## 2012-12-30 NOTE — Patient Instructions (Signed)
Today we updated your med list in our EPIC system...    Continue your current medications the same...  Today we did a follow up CXR... Please return to our lab one morning this week for your FASTING blood work...    We will contact you w/ the results when available...   Let's get on track w/ our diet & exercise program...   Look up "the glycemic index" on Google & try to avoid all those foods w/ a high index number...  Call for any questions...  Let's plan a follow up visit in 27mo, sooner if needed for problems.Marland KitchenMarland Kitchen

## 2012-12-31 ENCOUNTER — Other Ambulatory Visit (INDEPENDENT_AMBULATORY_CARE_PROVIDER_SITE_OTHER): Payer: Medicare Other

## 2012-12-31 DIAGNOSIS — F419 Anxiety disorder, unspecified: Secondary | ICD-10-CM

## 2012-12-31 DIAGNOSIS — E7800 Pure hypercholesterolemia, unspecified: Secondary | ICD-10-CM

## 2012-12-31 DIAGNOSIS — N401 Enlarged prostate with lower urinary tract symptoms: Secondary | ICD-10-CM

## 2012-12-31 DIAGNOSIS — E119 Type 2 diabetes mellitus without complications: Secondary | ICD-10-CM

## 2012-12-31 DIAGNOSIS — F411 Generalized anxiety disorder: Secondary | ICD-10-CM

## 2012-12-31 DIAGNOSIS — N200 Calculus of kidney: Secondary | ICD-10-CM

## 2012-12-31 DIAGNOSIS — I10 Essential (primary) hypertension: Secondary | ICD-10-CM

## 2012-12-31 LAB — CBC WITH DIFFERENTIAL/PLATELET
Basophils Relative: 0.3 % (ref 0.0–3.0)
Eosinophils Absolute: 0.2 10*3/uL (ref 0.0–0.7)
Eosinophils Relative: 2.3 % (ref 0.0–5.0)
Hemoglobin: 14.2 g/dL (ref 13.0–17.0)
Lymphocytes Relative: 20.1 % (ref 12.0–46.0)
MCHC: 33.7 g/dL (ref 30.0–36.0)
Neutro Abs: 6.1 10*3/uL (ref 1.4–7.7)
Neutrophils Relative %: 71.4 % (ref 43.0–77.0)
Platelets: 173 10*3/uL (ref 150.0–400.0)
RBC: 4.6 Mil/uL (ref 4.22–5.81)
WBC: 8.5 10*3/uL (ref 4.5–10.5)

## 2012-12-31 LAB — LIPID PANEL
Cholesterol: 171 mg/dL (ref 0–200)
HDL: 44.6 mg/dL (ref >39.00)
LDL Cholesterol: 100 mg/dL — ABNORMAL HIGH (ref 0–99)
Total CHOL/HDL Ratio: 4
Triglycerides: 130 mg/dL (ref 0.0–149.0)

## 2012-12-31 LAB — BASIC METABOLIC PANEL
BUN: 17 mg/dL (ref 6–23)
Calcium: 9.4 mg/dL (ref 8.4–10.5)
Chloride: 104 mEq/L (ref 96–112)
Creatinine, Ser: 1.3 mg/dL (ref 0.4–1.5)
Glucose, Bld: 153 mg/dL — ABNORMAL HIGH (ref 70–99)
Potassium: 4.3 mEq/L (ref 3.5–5.1)

## 2012-12-31 LAB — HEPATIC FUNCTION PANEL
ALT: 22 U/L (ref 0–53)
Bilirubin, Direct: 0.1 mg/dL (ref 0.0–0.3)
Total Bilirubin: 1 mg/dL (ref 0.3–1.2)
Total Protein: 7.5 g/dL (ref 6.0–8.3)

## 2012-12-31 LAB — PSA: PSA: 0.86 ng/mL (ref 0.10–4.00)

## 2012-12-31 LAB — HEMOGLOBIN A1C: Hgb A1c MFr Bld: 8.6 % — ABNORMAL HIGH (ref 4.6–6.5)

## 2013-01-01 ENCOUNTER — Other Ambulatory Visit: Payer: Self-pay | Admitting: Pulmonary Disease

## 2013-01-01 MED ORDER — GLIMEPIRIDE 4 MG PO TABS: 4.0000 mg | ORAL_TABLET | Freq: Every day | ORAL | Status: DC

## 2013-01-01 MED ORDER — METFORMIN HCL 500 MG PO TABS: ORAL_TABLET | ORAL | Status: DC

## 2013-01-01 NOTE — Telephone Encounter (Signed)
Called and spoke with pt and he is aware of lab results per SN.  Pt is aware of meds increased per SN and that these new rx have been sent to the pharmacy. Nothing further is needed.

## 2013-01-03 ENCOUNTER — Ambulatory Visit: Payer: Self-pay | Admitting: Pulmonary Disease

## 2013-01-22 ENCOUNTER — Other Ambulatory Visit: Payer: Self-pay | Admitting: Pulmonary Disease

## 2013-01-22 MED ORDER — ALLOPURINOL 300 MG PO TABS: ORAL_TABLET | ORAL | Status: DC

## 2013-01-22 MED ORDER — GLIMEPIRIDE 4 MG PO TABS: 4.0000 mg | ORAL_TABLET | Freq: Every day | ORAL | Status: DC

## 2013-01-22 MED ORDER — PRAVASTATIN SODIUM 40 MG PO TABS: 40.0000 mg | ORAL_TABLET | Freq: Every day | ORAL | Status: DC

## 2013-01-22 MED ORDER — METOPROLOL TARTRATE 50 MG PO TABS: ORAL_TABLET | ORAL | Status: DC

## 2013-01-22 NOTE — Telephone Encounter (Signed)
Pharmacy sent requests over for medications--pt requested a 90 day supply to be sent in.

## 2013-03-01 ENCOUNTER — Other Ambulatory Visit (HOSPITAL_COMMUNITY): Payer: Self-pay | Admitting: Family Medicine

## 2013-03-01 ENCOUNTER — Ambulatory Visit (HOSPITAL_COMMUNITY)
Admission: RE | Admit: 2013-03-01 | Discharge: 2013-03-01 | Discharge status: Against Medical Advice | Payer: Medicare Other | Source: Ambulatory Visit | Attending: Family Medicine | Admitting: Family Medicine

## 2013-03-01 DIAGNOSIS — R079 Chest pain, unspecified: Secondary | ICD-10-CM | POA: Insufficient documentation

## 2013-03-01 DIAGNOSIS — R0789 Other chest pain: Secondary | ICD-10-CM

## 2013-03-01 DIAGNOSIS — R918 Other nonspecific abnormal finding of lung field: Secondary | ICD-10-CM | POA: Insufficient documentation

## 2013-03-03 ENCOUNTER — Other Ambulatory Visit (HOSPITAL_COMMUNITY): Payer: Self-pay | Admitting: Family Medicine

## 2013-03-03 DIAGNOSIS — R079 Chest pain, unspecified: Secondary | ICD-10-CM

## 2013-04-05 ENCOUNTER — Other Ambulatory Visit: Payer: Self-pay | Admitting: Pulmonary Disease

## 2013-04-16 ENCOUNTER — Other Ambulatory Visit (HOSPITAL_COMMUNITY): Payer: Self-pay | Admitting: Family Medicine

## 2013-04-16 ENCOUNTER — Ambulatory Visit (HOSPITAL_COMMUNITY)
Admission: RE | Admit: 2013-04-16 | Discharge: 2013-04-16 | Disposition: A | Discharge status: Home / Self Care | Payer: Medicare HMO | Source: Ambulatory Visit | Attending: Family Medicine | Admitting: Family Medicine

## 2013-04-16 DIAGNOSIS — R079 Chest pain, unspecified: Secondary | ICD-10-CM

## 2013-04-16 LAB — POCT I-STAT CREATININE: Creatinine, Ser: 1.4 mg/dL — ABNORMAL HIGH (ref 0.50–1.35)

## 2013-04-16 MED ORDER — IOHEXOL 300 MG/ML  SOLN
80.0000 mL | Freq: Once | INTRAMUSCULAR | Status: AC | PRN
  Administered 2013-04-16: 80 mL via INTRAVENOUS

## 2013-05-06 ENCOUNTER — Other Ambulatory Visit (HOSPITAL_COMMUNITY): Payer: Self-pay | Admitting: Family Medicine

## 2013-05-06 DIAGNOSIS — E041 Nontoxic single thyroid nodule: Secondary | ICD-10-CM

## 2013-05-13 ENCOUNTER — Other Ambulatory Visit (HOSPITAL_COMMUNITY): Payer: Self-pay

## 2013-05-13 DIAGNOSIS — Z77090 Contact with and (suspected) exposure to asbestos: Secondary | ICD-10-CM

## 2013-05-15 ENCOUNTER — Encounter (HOSPITAL_COMMUNITY): Payer: Medicare HMO

## 2013-05-16 ENCOUNTER — Encounter (HOSPITAL_COMMUNITY): Payer: Medicare HMO

## 2013-05-19 ENCOUNTER — Encounter (HOSPITAL_COMMUNITY)
Admission: RE | Admit: 2013-05-19 | Discharge: 2013-05-19 | Disposition: A | Discharge status: Home / Self Care | Payer: Medicare HMO | Source: Ambulatory Visit | Attending: Family Medicine | Admitting: Family Medicine

## 2013-05-19 ENCOUNTER — Encounter (HOSPITAL_COMMUNITY): Payer: Self-pay

## 2013-05-19 ENCOUNTER — Other Ambulatory Visit: Payer: Self-pay | Admitting: Pulmonary Disease

## 2013-05-19 DIAGNOSIS — E041 Nontoxic single thyroid nodule: Secondary | ICD-10-CM | POA: Insufficient documentation

## 2013-05-19 MED ORDER — SODIUM IODIDE I 131 CAPSULE
12.0000 | Freq: Once | INTRAVENOUS | Status: AC | PRN
  Administered 2013-05-19: 12 via ORAL

## 2013-05-20 ENCOUNTER — Encounter (HOSPITAL_COMMUNITY)
Admission: RE | Admit: 2013-05-20 | Discharge: 2013-05-20 | Disposition: A | Discharge status: Home / Self Care | Payer: Medicare HMO | Source: Ambulatory Visit | Attending: Family Medicine | Admitting: Family Medicine

## 2013-05-20 ENCOUNTER — Encounter (HOSPITAL_COMMUNITY): Payer: Self-pay

## 2013-05-20 MED ORDER — SODIUM PERTECHNETATE TC 99M INJECTION
10.0000 | Freq: Once | INTRAVENOUS | Status: AC | PRN
  Administered 2013-05-20: 10 via INTRAVENOUS

## 2013-05-21 ENCOUNTER — Telehealth: Payer: Self-pay | Admitting: Pulmonary Disease

## 2013-05-22 NOTE — Telephone Encounter (Signed)
Nothing further is needed. 

## 2013-05-26 ENCOUNTER — Other Ambulatory Visit: Payer: Self-pay | Admitting: Pulmonary Disease

## 2013-05-27 ENCOUNTER — Ambulatory Visit (HOSPITAL_COMMUNITY)
Admission: RE | Admit: 2013-05-27 | Discharge: 2013-05-27 | Disposition: A | Discharge status: Home / Self Care | Payer: Medicare HMO | Source: Ambulatory Visit | Attending: Pulmonary Disease | Admitting: Pulmonary Disease

## 2013-05-27 DIAGNOSIS — Z77090 Contact with and (suspected) exposure to asbestos: Secondary | ICD-10-CM | POA: Insufficient documentation

## 2013-05-27 DIAGNOSIS — R0609 Other forms of dyspnea: Secondary | ICD-10-CM | POA: Insufficient documentation

## 2013-05-27 DIAGNOSIS — R0989 Other specified symptoms and signs involving the circulatory and respiratory systems: Principal | ICD-10-CM | POA: Insufficient documentation

## 2013-05-27 LAB — BLOOD GAS, ARTERIAL
Acid-base deficit: 0.4 mmol/L (ref 0.0–2.0)
Bicarbonate: 23.1 mEq/L (ref 20.0–24.0)
FIO2: 0.21 %
O2 SAT: 98 %
PATIENT TEMPERATURE: 37
TCO2: 20 mmol/L (ref 0–100)
pCO2 arterial: 33.7 mmHg — ABNORMAL LOW (ref 35.0–45.0)
pH, Arterial: 7.451 — ABNORMAL HIGH (ref 7.350–7.450)
pO2, Arterial: 102 mmHg — ABNORMAL HIGH (ref 80.0–100.0)

## 2013-05-27 MED ORDER — ALBUTEROL SULFATE (2.5 MG/3ML) 0.083% IN NEBU
2.5000 mg | INHALATION_SOLUTION | Freq: Once | RESPIRATORY_TRACT | Status: AC
Start: 2013-05-27 — End: 2013-05-27
  Administered 2013-05-27: 2.5 mg via RESPIRATORY_TRACT

## 2013-05-28 NOTE — Procedures (Signed)
NAME:  TELVIS, GORSUCH                ACCOUNT NO.:  [id number]  MEDICAL RECORD NO.:  [id number]  LOCATION:                                 FACILITY:  PHYSICIAN:  Jaiyana Canale L. Luan Pulling, M.D.DATE OF BIRTH:  Dec 23, 1940  DATE OF PROCEDURE:  05/27/2013 DATE OF DISCHARGE:                           PULMONARY FUNCTION TEST   REASON FOR PULMONARY FUNCTION TESTING:  Asbestos exposure.  1. Spirometry shows a mild ventilatory defect with evidence of airflow     obstruction. 2. Lung volumes show reduction in total lung capacity consistent with     restrictive pulmonary disease, and residual volume is somewhat     elevated. 3. Airway resistance is mildly elevated which may indicate the     presence of airflow obstruction as previously determined. 4. DLCO is moderately reduced. 5. Arterial blood gas is normal. 6. This study is consistent with the clinical diagnosis of asbestos     exposure.     Capricia Serda L. Luan Pulling, M.D.     ELH/MEDQ  D:  05/27/2013  T:  05/28/2013  Job:  [id number]

## 2013-05-29 ENCOUNTER — Telehealth: Payer: Self-pay | Admitting: Pulmonary Disease

## 2013-05-29 NOTE — Telephone Encounter (Signed)
Pt states that he is not out of Metoprolol --does not need refill at this time.  Refill request in leighs box states LMOMTCB x1 but has no other reason for the phone call. After researching further in patients chart, I did not find any other reason the patient was called other than in regards to this single Rx. Patient states that he just picked up new Rx this AM--no need for refill at this time.  And will delete refill request. Will close message.

## 2013-05-29 NOTE — Telephone Encounter (Signed)
lmtcb x1 

## 2013-06-06 ENCOUNTER — Telehealth: Payer: Self-pay | Admitting: Pulmonary Disease

## 2013-06-06 MED ORDER — AMLODIPINE BESYLATE 10 MG PO TABS: ORAL_TABLET | ORAL | Status: DC

## 2013-06-06 MED ORDER — ALLOPURINOL 300 MG PO TABS: ORAL_TABLET | ORAL | Status: DC

## 2013-06-06 MED ORDER — METOPROLOL TARTRATE 50 MG PO TABS: ORAL_TABLET | ORAL | Status: DC

## 2013-06-06 MED ORDER — FENOFIBRATE 160 MG PO TABS: ORAL_TABLET | ORAL | Status: DC

## 2013-06-06 MED ORDER — PRAVASTATIN SODIUM 40 MG PO TABS: 40.0000 mg | ORAL_TABLET | Freq: Every day | ORAL | Status: DC

## 2013-06-06 MED ORDER — METFORMIN HCL 500 MG PO TABS: ORAL_TABLET | ORAL | Status: DC

## 2013-06-06 MED ORDER — GLIMEPIRIDE 4 MG PO TABS: 4.0000 mg | ORAL_TABLET | Freq: Every day | ORAL | Status: DC

## 2013-06-06 NOTE — Telephone Encounter (Signed)
RX's have been sent. Nothing further needed

## 2013-06-22 ENCOUNTER — Other Ambulatory Visit: Payer: Self-pay | Admitting: Pulmonary Disease

## 2013-06-30 ENCOUNTER — Telehealth: Payer: Self-pay | Admitting: Pulmonary Disease

## 2013-06-30 ENCOUNTER — Ambulatory Visit (INDEPENDENT_AMBULATORY_CARE_PROVIDER_SITE_OTHER): Payer: Commercial Managed Care - HMO | Admitting: Pulmonary Disease

## 2013-06-30 ENCOUNTER — Encounter: Payer: Self-pay | Admitting: Pulmonary Disease

## 2013-06-30 ENCOUNTER — Other Ambulatory Visit (INDEPENDENT_AMBULATORY_CARE_PROVIDER_SITE_OTHER): Payer: Commercial Managed Care - HMO

## 2013-06-30 VITALS — BP 130/80 | HR 71 | Temp 98.1°F | Ht 72.0 in | Wt 224.0 lb

## 2013-06-30 DIAGNOSIS — J948 Other specified pleural conditions: Secondary | ICD-10-CM

## 2013-06-30 DIAGNOSIS — E119 Type 2 diabetes mellitus without complications: Secondary | ICD-10-CM

## 2013-06-30 DIAGNOSIS — N401 Enlarged prostate with lower urinary tract symptoms: Secondary | ICD-10-CM

## 2013-06-30 DIAGNOSIS — N138 Other obstructive and reflux uropathy: Secondary | ICD-10-CM

## 2013-06-30 DIAGNOSIS — K219 Gastro-esophageal reflux disease without esophagitis: Secondary | ICD-10-CM

## 2013-06-30 DIAGNOSIS — E7800 Pure hypercholesterolemia, unspecified: Secondary | ICD-10-CM

## 2013-06-30 DIAGNOSIS — R091 Pleurisy: Secondary | ICD-10-CM

## 2013-06-30 DIAGNOSIS — I10 Essential (primary) hypertension: Secondary | ICD-10-CM

## 2013-06-30 DIAGNOSIS — L910 Hypertrophic scar: Secondary | ICD-10-CM

## 2013-06-30 DIAGNOSIS — J984 Other disorders of lung: Secondary | ICD-10-CM

## 2013-06-30 DIAGNOSIS — M1990 Unspecified osteoarthritis, unspecified site: Secondary | ICD-10-CM

## 2013-06-30 DIAGNOSIS — M109 Gout, unspecified: Secondary | ICD-10-CM

## 2013-06-30 LAB — BASIC METABOLIC PANEL
BUN: 13 mg/dL (ref 6–23)
CALCIUM: 9.6 mg/dL (ref 8.4–10.5)
CHLORIDE: 103 meq/L (ref 96–112)
CO2: 28 meq/L (ref 19–32)
Creatinine, Ser: 1.3 mg/dL (ref 0.4–1.5)
GFR: 72.98 mL/min (ref >60.00)
GLUCOSE: 136 mg/dL — AB (ref 70–99)
Potassium: 4.6 mEq/L (ref 3.5–5.1)
SODIUM: 140 meq/L (ref 135–145)

## 2013-06-30 LAB — HEMOGLOBIN A1C: Hgb A1c MFr Bld: 7.3 % — ABNORMAL HIGH (ref 4.6–6.5)

## 2013-06-30 MED ORDER — SITAGLIPTIN PHOSPHATE 100 MG PO TABS: 100.0000 mg | ORAL_TABLET | Freq: Every day | ORAL | Status: DC

## 2013-06-30 NOTE — Telephone Encounter (Signed)
Received 5 pages from [facility], sent to Dr. Lenna Gilford. 06/30/13/ss.

## 2013-06-30 NOTE — Patient Instructions (Signed)
Today we updated your med list in our EPIC system...    We decided to STOP the Metformin due to your itching on this drug...  Continue the GLIMEPIRIDE 4mg  one tab each morming...  We are adding JANUVIA 100mg  - take one tab each day at dinnertime...  DIET & EXERCISE are the keys!!!  Today we did your f/u DM labs...    We will contact you w/ the results when available...   Call for any questions...  Let's plan a follow up visit in 33mo, sooner if needed for problems.Marland KitchenMarland Kitchen

## 2013-07-04 ENCOUNTER — Encounter: Payer: Self-pay | Admitting: Pulmonary Disease

## 2013-07-04 NOTE — Progress Notes (Signed)
Subjective:    Patient ID: Hector Rose, male    DOB: 07-09-1940, 73 y.o.   MRN: [id number]  HPI 73 y/o BM here for a follow up visit... he has mult med prob as noted below...  ~  November 27, 2011:  88mo ROV & [name] is generally stable- he has several somatic complaints- left leg feels like it wants to give out, lotion makes his foot itch, hx tinea pedis- treated...  He is still on Coumadin from [facility] in [place] for hx DVT;  Chol controlled on diet alone but TG are elev & he is suggested to start FENOFIBRATE 160mg /d;  DM control is satid on his Metform & Glimep (see below);  He saw Urology DrBorden 5/13> HxBPH & LUTS, s/p open simple prostatectomy 2002 by DrDavis, Hx hematuria- neg CT, cysto, biopsies 2/13;  Had surveillance cysto due to pos urine cytology- cysto neg...    We reviewed prob list, meds, xrays and labs> see below>> OK Flu shot today...  LABS 9/13:  FLP- ok x TG=304 & he will start Feno160;  Chems- ok x BS=125, A1c=7.1;  CBC- wnl;  TSH=1.19;  PSA= 0.84   ~  June 12, 2012:  44mo ROV & [name] is c/o some left jaw pain "like it wants to lock-up on me", exam is neg & rec to use hot soaks/ take Mobic15 as needed (& Prilosec); if symptoms persist he will check w/ his dentist... We reviewed the following medical problems during today's office visit >> Note: he also sees DrGolding in [place]...    Known right base nodule> granuloma RLL w/o change on serial CXRs; last film 9/12 ==> clear, NAD; denies cough, sput, hemoptysis, SOB, etc...    HBP> on Metop50Bid & Amlod10; BP= 108/72 & he is asymptomatic w/o CP, palpit, HA, dizzy, SOB, edema, etc...    Hx DVT> this occurred in 2007 & treated in [place] by [facility]- since last [employer] he had a conversation w/ DrGolding & they have stopped the Coumadin, doing well off this med so far...    Lipids> on Feno160 for TG~300 & diet; FLP 3/14 shows TChol 214, TG 143, HDL 44, LDL 148; Rec to add PRAVASTATIN40 + better diet!    DM> on Metformin  500mg Bid & Glimep1mg /d; BS=113, A1c= 8.3; Rec to INCR Glimep1mg => 2mg /d & advised better diet & weight reduction...    GI> on Omep20 as needed & up to date on colon screening...    GU> eval for hematuria by DrBorden; rechecked 12/13 & stable w/ clear urine, PSA=1.0, & felt to be doing well...    Hx DJD/ Gout> no prob since starting on the Allopurinol 300mg /d, & has Mobic15 for prn use per DrGolding... We reviewed prob list, meds, xrays and labs> see below for updates >>   LABS 3/14:  FLP- not at goals on Feno160 & rec to add Prav40;  Chems- fairBS=113 A1c=8.3 Cr=1.3   ~  December 30, 2012:  62mo ROV & Nyzir has noted that his BS has been running 150-200 at home- weight is up 5# despite diet efforts, on Metform & Glimep, we reviewed low carb diet & "the glycemic index"... We reviewed the following medical problems during today's office visit >>     Known right base nodule> granuloma RLL w/o change on serial CXRs; CXR 10/14 => NAD; he denies cough, sput, hemoptysis, SOB, etc...    HBP> on Metop50Bid & Amlod10; BP= 124/70 & he is asymptomatic w/o CP, palpit, HA, dizzy, SOB, edema, etc..Marland Kitchen  Hx DVT> this occurred in 2007 & treated in [place] by [facility]- off Coumadin now per DrGolding & doing well off this med so far...    Lipids> on Prav40, CoQ10, & Feno160 for TG~300 & diet; FLP 10/14 shows TChol 171, TG 130, HDL 45, LDL 100; numbers improved on meds, now needs to lose wt!    DM> on Metformin 500mg Bid & Glimep2mg /d; Labs 10/14 showed BS=153, A1c= 8.6; Rec to INCR Metform2Bid & Glimep4mg /d...    GI> on Omep20 as needed & up to date on colon screening...    GU> eval for hematuria by DrBorden; rechecked 12/13 & stable w/ clear urine, PSA=0.86, & felt to be doing well, requests Viagra...    Hx DJD/ Gout> no prob since starting on the Allopurinol 300mg /d, & uses OTC analgesics as needed... We reviewed prob list, meds, xrays and labs> see below for updates >>   CXR 10/14 showed stable heart size,  clear lungs x right base scarring, mild degen changes in Tspine, NAD...  LABS 10/14:  FLP- at goals on Prav40+Feno160;  Chems- ok x BS=153, A1C=8.6;  CBC=wnl;  TSH=1.74;  PSA=0.86...   ~  June 30, 2013:  42mo ROV & [name] indicates that he is feeling well and "doing good";  Info in EPIC indicates that he went to the ER at [facility] 12/14 but there are no ER notes to review, he had CP & was seen by DrGolding in [place]- we do not have access to his notes- pt indicates that several tests were done & he was sent to DrStClair in [place] for Cards eval & Myoview; I checked "care everywhere" in epic and EKG reported Sinus brady, 1st degree AVB, borderline tracing, but Myoview results not avail (pt indicates that it was OK) & he has not had any further check discomfort... We reviewed the following medical problems during today's office visit >>     Known right base nodule> lesion on right hemidiaph assoc w/ some pleural calcif (no known asbestos exposure) ?granuloma w/o change on serial CXRs; he denies cough, sput, hemoptysis, SOB, edema, etc...    HBP> on Metop50Bid & Amlod10; BP= 130/80 & he is asymptomatic w/o CP, palpit, HA, dizzy, SOB, edema, etc...    Chest pain hx> eval by DrGolding in [place] in [place]> we do not have their records, pt reports that Myoview was OK but prev Myoview 2009 by DrWall showed some HK & EF=47%...    Hx DVT> this occurred in 2007 & treated in [place] by [facility]- off Coumadin now per DrGolding & doing well off this med so far...    Lipids> on Prav40, CoQ10, & Feno160 for TG~300 & diet; FLP 10/14 shows TChol 171, TG 130, HDL 45, LDL 100; numbers improved on meds, now needs to lose wt!    DM> supposed to be on Metformin500-2Bid & Glimep4mg /d; but only taking Metform500/d due to dose dependent itching on 4/d and 2/d per pt hx "it makes me itch so bad"; Labs 4/15 showed BS=136, A1c=7.3 & we decided to STOP the Metform & try Glimep4mg Qam & Januvia100mg Qpm...     GI>  on Omep20 as needed & up to date on colon screening...    GU> eval for hematuria by DrBorden; rechecked 12/13 & stable w/ clear urine, PSA=0.86, & felt to be doing well, requests Viagra...    Hx DJD/ Gout> no prob since starting on the Allopurinol 300mg /d, & known Spine dis w/ diffuse spondylosis & osteophtes etc; uses OTC analgesics as needed... We  reviewed prob list, meds, xrays and labs> see below for updates >> studies ordered by DrGolding 12/14-2/15>>   CXR 12/14 showed norm heart size, mild tortuosity of Ao, density on right hemidiaph- unchanged from old films, otherw clear, min pleural thickening...  PFT done 3/25 at [facility]:  FVC=3.06 (70%), FEV1=2.05 (62%) & 9% improvement after bronchodil, %1sec=67, mid-flows=35% predicted; LungVolumes showed VC=61%, RV=115%, w/ RV/TLC=54 (149%predicted); DLCO reduced at 55% but was wnl after correction for VA... This is c/w mild airflow obstruction w/ superimposed mod restriction w/ air trapping...   ABGs on RA showed pH=7.45, pCO2=34, pO2=102  EKG & Myoview done by DrStClair in [place] & results not avail in epic....  I-131 Thyroid Uptake & Scan done 3/15 instead of a thyroid ultrasound> 24h uptake sl low at 7%, homogeneous uptake bilat, no focal abn seen...  CT Chest done 2/15 showed calcified pleural plaque on the right ? prior asbestos exposure, no adenopathy, mult low-attenuation nodules in right lobe of thyroid up to 1.1cm, multilevel spondylosis in Tspine (& a lucent lesion in T2 ?etiology), and "nothing to explain pt's ant CP"...            PROBLEM LIST:    PULMONARY NODULE (ICD-518.89) - vague nodular opacity right base over diaphragm on old films... ~  1.7 cm benign ?granuloma... no change on serial CXR or CT's back to 2000. ~  CXR 2/10 is WNL- nodule not seen... ~  CXR 9/10 showed tort Ao, Cor WNL, lungs clear w/o nodule identified. ~  CXR 9/11 showed clear lungs, NAD.Marland Kitchen. ~  CXR 9/12 showed clear lungs, NAD.Marland Kitchen. ~  CXR 10/14 showed  stable heart size, clear lungs x right base scarring, mild degen changes in Tspine, NAD.Marland Kitchen. ~  CXR 12/14 showed norm heart size, mild tortuosity of Ao, density on right hemidiaph- unchanged from old films, otherw clear, min pleural thickening... ~  CT Chest 2/15 showed calcified pleural plaque on the right ? prior asbestos exposure, no adenopathy, mult low-attenuation nodules in right lobe of thyroid up to 1.1cm, multilevel spondylosis in Tspine (& a lucent lesion in T2 ?etiology), and "nothing to explain pt's ant CP"...  ~  PFT done 3/25 at [facility]:  FVC=3.06 (70%), FEV1=2.05 (62%) & 9% improvement after bronchodil, %1sec=67, mid-flows=35% predicted; LungVolumes showed VC=61%, RV=115%, w/ RV/TLC=54 (149%predicted); DLCO reduced at 55% but was wnl after correction for VA... This is c/w mild airflow obstruction w/ superimposed mod restriction w/ air trapping...  ~  ABGs on RA showed pH=7.45, pCO2=34, pO2=102  HYPERTENSION (ICD-401.9) - controlled on TOPROL 50mg Bid and NORVASC 10mg /d... BP's at home in the 120-130/70 range, and measures 120/78 today- he denies HA, fatigue, visual changes, CP, palipit, dizziness, syncope, dyspnea, edema, etc. ~  he had a neg Cardiolite study in 10/04- no ischemia and EF=52%. ~  Aug09: eval DrWall for atyp CP & risk factors> Myoview 11/01/07 showed no evid for infarct, +diaph attenuation, EF sl reduced at 47%. ~  EKG 1/13 showed NSR, rate67, WNL, NAD... ~  4/14: on Metop50Bid & Amlod10; BP= 108/72 & he is asymptomatic w/o CP, palpit, HA, dizzy, SOB, edema, etc. ~  10/14: on Metop50Bid & Amlod10; BP= 124/70 & he remains essentially asymptomatic... ~  4/15: on Metop50Bid & Amlod10; BP= 130/80 & he is asymptomatic w/o CP, palpit, HA, dizzy, SOB, edema, etc.  DEEP VENOUS THROMBOPHLEBITIS (ICD-453.40) - Dx'd by LMD in [place], DrGolding in 3/07... [facility] and had pain/ swelling L calf... ultrasound at  [facility] + for DVT & Rx COUMADIN by DrGolding  (protimes q month)- no recent problems, he will check w/ DrGolding about discontinuing the Coumadin. ~  3/12:  Pt remains on Coumadin 3yrs after his single episode unprovoked DVT left leg, followed by DrGolding... ~  3/13:  Pt remains on Coumadin & I have suggested that he discuss coming off this med w/ his LMD, DrGolding... ~  4/14:  DrGolding has stopped his Coumadin in the interval- doing well so far w/o recurrent DVT... ~  4/15: he remains off Coumadin 7 doing well, w/o recurrent DVT...  HYPERCHOLESTEROLEMIA (ICD-272.0) - controlled on SIMVASTATIN 40mg /d & FISH OIL...  ~  [place] 9/08 showed TChol 148, TG 200, HDL 30, LDL 78... on Advicor 500-20 at that time- keep same. ~  [place] 3/09 showed TChol 214, TG 236, HDL 44, LDL 114... on Advicor- rec change to Simvastatin40. ~  FLP 7/09 showed TChol 129, Tg 188, HDL 35, LDL 57... rec- keep same. ~  FLP 1/10 showed TChol 142, TG 165, HDL 35, LDL 74... stable on Simva40. ~  FLP 3/11 showed TChol 127, TG 147, HDL 34, LDL 64 ~  FLP 9/11 showed TChol 143, TG 151, HDL 36, LDL 77 ~  FLP 3/12 on Simva40 showed TChol 161, TG 103, HDL 43, LDL 98 ~  FLP 9/12 on Simva40 showed TChol 196, TG 119, HDL 49, LDL 123... Same med, better diet effort. ~  FLP 3/13 off Simva showed TChol 196, TG 303, HDL 45, LDL 85... Needs better low fat diet & wt reduction. ~  FLP 9/13 on diet alone showed TChol 175, TG 304, HDL 38, LDL 70... rec to add FENOFIBRATE 160mg /d... ~  [place] 3/14 on Feno160 showed TChol 214, TG 143, HDL 44, LDL 148... rec to add PRAVASTATIN40 ~  FLP 10/14 on Prav40+Feno160 showed TChol 171, TG 130, HDL 45, LDL 100  DM (ICD-250.00) >>  ~  on METFORMIN 500mg Bid & GLIMEPIRIDE 1mg /d (NOTE: hx prev hypoglycemia from Glucovance). ~  labs 9/08 showed FBS=106 and HgA1C=6.5.Marland KitchenMarland Kitchen on Glucovance 1/2 Bid. ~  labs 3/09 showed BS= 99, HgA1c= 7.0.Marland KitchenMarland Kitchen on Glucovance 1/2 Qam only- rec to incr to Bid again. ~  labs 7/09 showed BS= 104, HgA1c= 6.8.Marland KitchenMarland Kitchen rec- keep same. ~  pt reports  sugars too low ~50 at 11AM if he takes even 1/2 tab in AM, therefore he changed to 1/2 at dinner only... ~  labs 1/10 showed BS= 125, A1c= 7.0 ~  labs 9/10 showed BS= 102, A1c= 6.8 ~  labs 3/11 showed BS= 110, A1c= 7.0.Marland KitchenMarland Kitchen continue same meds, get wt down! ~  labs 9/11 showed BS= 129, A1c= 7.7.Marland KitchenMarland Kitchen rec change to METFORMIN ER 500mg  Qam & GLIMEPIRIDE 1mg  Qam. ~  labs 3/12 showed BS= 113, A1c= 9.0.Marland KitchenMarland Kitchen rec to double meds> Metform500Bid, Glim2mg AM (?he never incr the Amaryl) ~  Labs 9/12 showed BS= 120, A1c= 7.2.Marland KitchenMarland Kitchen Keep same (?on Glimep1mg ?) + low carb diet. ~  Labs 3/13 on Metform500Bid+Glim1 showed BS=120, A1c=7.4.Marland KitchenMarland Kitchen Keep same med, better diet, get wt down ~  Labs 9/13 on Metform500Bid+Glim1 showed BS=125, A1c=7.1 ~  Labs 4/14 on Metform500Bid+Glim1 showed BS=113, A1c=8.3.Marland KitchenMarland Kitchen rec to incr Glimep2mg Qam ~  Labs 10/14 on Metform500Bid+Glim2 showed BS=153, A1c=8.6.Marland KitchenMarland Kitchen rec incr Metform500-2Bid & Glim4Qam, + diet & exercise... ~  Labs 4/15 on Metform500/d+Glimep4 (wt down 8# to 224) showed BS=136, A1c=7.3; HE IS INTOL TO METFORM w/ itching; Rec- Glimep4Qam & Januvia100Qpm...  GERD (ICD-530.81) - takes OMEPRAZOLE 20mg  Prn for reflux symptoms... ~  neg colonoscopy  by DrStark in 6/00, and again 8/08 ( x sm hem's)... f/u planned 10 yrs. ~  Abd Sonar 11/12 showed Fatty Liver, mild GB sludge w/o stones, overlying bowel gas...  BENIGN PROSTATIC HYPERTROPHY, WITH OBSTRUCTION (ICD-600.01) - he sees DrDavis yearly- pt reports "PSA was good" (we don't have notes from him)... S/P open prostatectomy 1999 by [id number]... Hx prostatitis in past... uses Cialis for ED. ~  labs 3/11 showed BUN= 10, Crear= 1.2... PSA done by Urology. ~  labs 9/11 showed BUN= 11, Creat= 1.3, PSA= 1.13 ~  he had f/u Urology DrBorden- BPH/ LUTS, PSA screening- "it was good" per pt. ~  Labs 9/12 showed PSA= 1.23 ~  Labs 9/13 showed PSA= 0.84 ~  12/13: he had f/u Urology DrBorden> BPH w/ prev prostatectomy 1999, Hx hematuria while on coumadin & abn  urine cytology w/ neg cysto; yearly f/u doing well... ~  10/14:  Labs here showed PSA= 0.86  RENAL CALCULUS (ICD-592.0) HEMATURIA >> on Coumadin per DrGolding in [place] & eval by DrBorden, Urology... ~  adm 2/13 by Urology DrBorden for hematuria & abn Ucytology (on Coumadin per DrGolding) w/ prev neg CT Abd & office cysto; hosp cysto (w/ bladder bxs off Coumadin- all neg), retrograde pyelogram (neg), he will continue to follow... ~  No further hematuria & UA in office all neg w/o blood; he continues to f/u w/ DrBorden...  DEGENERATIVE JOINT DISEASE (ICD-715.90) - hx of pain on his right side and right elbow... prev Rx w/ Etodolac, Parafon, heat and elbow pad... refer to ortho if symptoms persist... now he uses OTC anti-inflamm Rx Prn.  GOUT (ICD-274.9) - states he had episode right elbow arthritis and gout Rx'd by DrSypher 4/09 w/ Colchicine & Allopurinol 100mg /d... ~  labs 7/09 show Uric 6.1.Marland KitchenMarland Kitchen OK on Allopurinol 100mg /d... ~  labs 1/10 showed Uric= 6.5 ~  9/11:  pt reports that he stopped Allopurinol earlier this yr & had recent gout attack per DrGolding Rx w/ Probenecid... he would like to restart the Allopurinol preventive Rx & I agree> ALLOPURINOL 300mg /d written. ~  No further gout attacks on the Allopurinol 300mg /d...  CARPAL TUNNEL SYNDROME (ICD-354.0) - treated by DrSypher over the years for CTS and mult trigger fingers (w/ surg)...  KELOID (ICD-701.4)  Health Maintenance - pt had "bug bite" on leg 9/10 w/ Tetanus shot by DrGolding in [place]... prev PNEUMOVAX 2000 at age 89, therefore given f/u PNEUMOVAX in 2010 at age 57... he gets yearly flu vaccine each fall.   Past Surgical History  Procedure Laterality Date  . Prostatectomy  1999    Dr. Rosana Hoes  . Cosmetic surgery  2000    Keloid injections  . Ortho surgery on fingers  2005 & 2008    Dr. Lorelle Formosa finger release  . Cystoscopy with biopsy  04/14/2011    Procedure: CYSTOSCOPY WITH BIOPSY;  Surgeon: Dutch Gray, MD;   Location: [facility];  Service: Urology;  Laterality: N/A;  Bladder Biopsies Biopsy of Prostatic Urethra    . Cystoscopy/retrograde/ureteroscopy  04/14/2011    Procedure: CYSTOSCOPY/RETROGRADE/URETEROSCOPY;  Surgeon: Dutch Gray, MD;  Location: [facility];  Service: Urology;  Laterality: Bilateral;  (Bil) RPG     Outpatient Encounter Prescriptions as of 06/30/2013  Medication Sig  . allopurinol (ZYLOPRIM) 300 MG tablet TAKE 1 TABLET BY MOUTH ONCE DAILY  . amLODipine (NORVASC) 10 MG tablet TAKE 1 TABLET BY MOUTH EVERY MORNING  . fenofibrate 160 MG tablet TAKE 1 TABLET (160 MG TOTAL) BY MOUTH DAILY.  Marland Kitchen glimepiride (  AMARYL) 4 MG tablet Take 1 tablet (4 mg total) by mouth daily before breakfast.  . glucose blood (ACCU-CHEK AVIVA PLUS) test strip Use as instructed once daily  . Lancets (ACCU-CHEK MULTICLIX) lancets Use as instructed once daily  . metoprolol (LOPRESSOR) 50 MG tablet TAKE 1 TABLET BY MOUTH TWO TIMES A DAY  . Multiple Vitamin (MULITIVITAMIN WITH MINERALS) TABS Take 1 tablet by mouth daily. Mens One a Day.  Marland Kitchen omeprazole (PRILOSEC) 20 MG capsule TAKE ONE CAPSULE BY MOUTH ONCE DAILY AS NEEDED FOR STOMACH ACID  . Polyethyl Glycol-Propyl Glycol (SYSTANE OP) Apply 1 drop to eye as needed. For dry eyes.  . polyvinyl alcohol (LIQUIFILM TEARS) 1.4 % ophthalmic solution Place 1 drop into both eyes as needed. For dry eyes.  . pravastatin (PRAVACHOL) 40 MG tablet Take 1 tablet (40 mg total) by mouth daily.  . [DISCONTINUED] metFORMIN (GLUCOPHAGE) 500 MG tablet Take 2 tablets by mouth two times daily  . [DISCONTINUED] metFORMIN (GLUCOPHAGE) 500 MG tablet Take 500 mg by mouth daily with breakfast.  . sitaGLIPtin (JANUVIA) 100 MG tablet Take 1 tablet (100 mg total) by mouth daily.  . [DISCONTINUED] glimepiride (AMARYL) 2 MG tablet TAKE 1 TABLET (2 MG TOTAL) BY MOUTH DAILY BEFORE BREAKFAST.  . [DISCONTINUED] metoprolol (LOPRESSOR) 50 MG tablet TAKE 1 TABLET BY MOUTH TWO TIMES A DAY    Allergies  Allergen  Reactions  . Ace Inhibitors Swelling    REACTION: angio edema (swelling of lips)  . Metformin And Related     itching    Current Medications, Allergies, Past Medical History, Past Surgical History, Family History, and Social History were reviewed in [employer] record.    Review of Systems        See HPI - all other systems neg except as noted...   The patient complains of dyspnea on exertion.  The patient denies anorexia, fever, weight loss, weight gain, vision loss, decreased hearing, hoarseness, chest pain, syncope, peripheral edema, prolonged cough, headaches, hemoptysis, abdominal pain, melena, hematochezia, severe indigestion/heartburn, hematuria, incontinence, muscle weakness, suspicious skin lesions, transient blindness, difficulty walking, depression, unusual weight change, abnormal bleeding, enlarged lymph nodes, and angioedema.     Objective:   Physical Exam     WD, WN, overwt 73 y/o BM in NAD...  GENERAL:  Alert & oriented; pleasant & cooperative... HEENT:  [place]/AT, EOM-wnl, PERRLA, EACs-wax, TMs-wnl, NOSE-clear, THROAT-clear & wnl, no angioedema. NECK:  Supple w/ full ROM; no JVD; normal carotid impulses w/o bruits; no thyromegaly or nodules palpated; no lymphadenopathy. CHEST:  Clear to P & A; without wheezes/ rales/ or rhonchi. HEART:  Regular Rhythm; without murmurs/ rubs/ or gallops. ABDOMEN:  Soft & nontender; normal bowel sounds; no organomegaly or masses detected. RECTAL:  prostate 3+, no nodules, stool heme neg... EXT:  without deformities, mild arthritic changes; no varicose veins/ venous insuffic/ or edema. NEURO:  CN's intact; motor testing normal; sensory testing normal; gait normal & balance OK. DERM:  Keloids noted...  RADIOLOGY DATA:  Reviewed in the EPIC EMR & discussed w/ the patient...  LABORATORY DATA:  Reviewed in the EPIC EMR & discussed w/ the patient...   Assessment & Plan:    Pulm Nodule, min pleural plaque w/o known  asbestos exposure, mild obstructive & mod restrictive defects>  Known lesion & calcif plaque right base above diaph seen on old CTs but not readily visible on plain films; he has no real resp symptoms & we are following...  HBP>  Controlled on BBlocker,  CCB; tol well, continue same...  Hx DVT>  DrGolding has stopped the Coumadin; hx single episode left leg DVT in 2007- Dx in [place] & followed there, no recurrence...  CHOL>  On Prav40 + Feno160; tol ok w/ addition of CoQ10; FLP 10/14 is improved, now work on diet 7 get wt down!  DM>  He reports INTOL to Metform w/ itching; we will Rx w/ Glimep4mg Qam & add Januvia100mg Qpm...  GI> GERD> on PPI as needed; had colon 2008, neg & f/u due 2018...  GU> BPH w/ open prostatectomy 1999 by DrDavis;  PSA remains WNL; he has remote hx kidney stone; prob of Hematuria & ?abn cytology in office; Cysto, retrogrades & bladder bxs were all neg 2/13 per DrBorden & he continues to follow...  DJD/ Gout/ Hx CTS>  Stable on OTC anti-inflamm as needed and Allopurinol daily...  Keloid>  Aware, no change...   Patient's Medications  New Prescriptions   SITAGLIPTIN (JANUVIA) 100 MG TABLET    Take 1 tablet (100 mg total) by mouth daily.  Previous Medications   ALLOPURINOL (ZYLOPRIM) 300 MG TABLET    TAKE 1 TABLET BY MOUTH ONCE DAILY   AMLODIPINE (NORVASC) 10 MG TABLET    TAKE 1 TABLET BY MOUTH EVERY MORNING   FENOFIBRATE 160 MG TABLET    TAKE 1 TABLET (160 MG TOTAL) BY MOUTH DAILY.   GLIMEPIRIDE (AMARYL) 4 MG TABLET    Take 1 tablet (4 mg total) by mouth daily before breakfast.   GLUCOSE BLOOD (ACCU-CHEK AVIVA PLUS) TEST STRIP    Use as instructed once daily   LANCETS (ACCU-CHEK MULTICLIX) LANCETS    Use as instructed once daily   METOPROLOL (LOPRESSOR) 50 MG TABLET    TAKE 1 TABLET BY MOUTH TWO TIMES A DAY   MULTIPLE VITAMIN (MULITIVITAMIN WITH MINERALS) TABS    Take 1 tablet by mouth daily. Mens One a Day.   OMEPRAZOLE (PRILOSEC) 20 MG CAPSULE    TAKE ONE  CAPSULE BY MOUTH ONCE DAILY AS NEEDED FOR STOMACH ACID   POLYETHYL GLYCOL-PROPYL GLYCOL (SYSTANE OP)    Apply 1 drop to eye as needed. For dry eyes.   POLYVINYL ALCOHOL (LIQUIFILM TEARS) 1.4 % OPHTHALMIC SOLUTION    Place 1 drop into both eyes as needed. For dry eyes.   PRAVASTATIN (PRAVACHOL) 40 MG TABLET    Take 1 tablet (40 mg total) by mouth daily.  Modified Medications   No medications on file  Discontinued Medications   GLIMEPIRIDE (AMARYL) 2 MG TABLET    TAKE 1 TABLET (2 MG TOTAL) BY MOUTH DAILY BEFORE BREAKFAST.   METFORMIN (GLUCOPHAGE) 500 MG TABLET    Take 2 tablets by mouth two times daily   METFORMIN (GLUCOPHAGE) 500 MG TABLET    Take 500 mg by mouth daily with breakfast.   METOPROLOL (LOPRESSOR) 50 MG TABLET    TAKE 1 TABLET BY MOUTH TWO TIMES A DAY

## 2013-07-09 ENCOUNTER — Other Ambulatory Visit: Payer: Self-pay | Admitting: Endocrinology

## 2013-07-09 DIAGNOSIS — E049 Nontoxic goiter, unspecified: Secondary | ICD-10-CM

## 2013-07-16 ENCOUNTER — Ambulatory Visit
Admission: RE | Admit: 2013-07-16 | Discharge: 2013-07-16 | Disposition: A | Discharge status: Home / Self Care | Payer: Commercial Managed Care - HMO | Source: Ambulatory Visit | Attending: Endocrinology | Admitting: Endocrinology

## 2013-07-16 DIAGNOSIS — E049 Nontoxic goiter, unspecified: Secondary | ICD-10-CM

## 2013-07-21 ENCOUNTER — Other Ambulatory Visit: Payer: Self-pay | Admitting: Endocrinology

## 2013-07-21 DIAGNOSIS — E042 Nontoxic multinodular goiter: Secondary | ICD-10-CM

## 2013-07-29 ENCOUNTER — Ambulatory Visit
Admission: RE | Admit: 2013-07-29 | Discharge: 2013-07-29 | Disposition: A | Discharge status: Home / Self Care | Payer: Commercial Managed Care - HMO | Source: Ambulatory Visit | Attending: Endocrinology | Admitting: Endocrinology

## 2013-07-29 ENCOUNTER — Other Ambulatory Visit (HOSPITAL_COMMUNITY)
Admission: RE | Admit: 2013-07-29 | Discharge: 2013-07-29 | Disposition: A | Discharge status: Home / Self Care | Payer: Medicare HMO | Source: Ambulatory Visit | Attending: Interventional Radiology | Admitting: Interventional Radiology

## 2013-07-29 DIAGNOSIS — E042 Nontoxic multinodular goiter: Secondary | ICD-10-CM

## 2013-08-05 ENCOUNTER — Other Ambulatory Visit: Payer: Self-pay | Admitting: Pulmonary Disease

## 2013-08-08 ENCOUNTER — Other Ambulatory Visit: Payer: Self-pay | Admitting: Endocrinology

## 2013-08-08 DIAGNOSIS — E049 Nontoxic goiter, unspecified: Secondary | ICD-10-CM

## 2013-08-15 ENCOUNTER — Encounter (HOSPITAL_COMMUNITY): Payer: Self-pay | Admitting: Emergency Medicine

## 2013-08-15 ENCOUNTER — Emergency Department (HOSPITAL_COMMUNITY)
Admission: EM | Admit: 2013-08-15 | Discharge: 2013-08-16 | Disposition: A | Discharge status: Home / Self Care | Payer: Medicare HMO | Attending: Emergency Medicine | Admitting: Emergency Medicine

## 2013-08-15 DIAGNOSIS — K219 Gastro-esophageal reflux disease without esophagitis: Secondary | ICD-10-CM | POA: Insufficient documentation

## 2013-08-15 DIAGNOSIS — Z8739 Personal history of other diseases of the musculoskeletal system and connective tissue: Secondary | ICD-10-CM | POA: Insufficient documentation

## 2013-08-15 DIAGNOSIS — Z872 Personal history of diseases of the skin and subcutaneous tissue: Secondary | ICD-10-CM | POA: Insufficient documentation

## 2013-08-15 DIAGNOSIS — I10 Essential (primary) hypertension: Secondary | ICD-10-CM | POA: Insufficient documentation

## 2013-08-15 DIAGNOSIS — Z79899 Other long term (current) drug therapy: Secondary | ICD-10-CM | POA: Insufficient documentation

## 2013-08-15 DIAGNOSIS — T783XXA Angioneurotic edema, initial encounter: Secondary | ICD-10-CM | POA: Insufficient documentation

## 2013-08-15 DIAGNOSIS — Z8669 Personal history of other diseases of the nervous system and sense organs: Secondary | ICD-10-CM | POA: Insufficient documentation

## 2013-08-15 DIAGNOSIS — Z87448 Personal history of other diseases of urinary system: Secondary | ICD-10-CM | POA: Insufficient documentation

## 2013-08-15 DIAGNOSIS — E7800 Pure hypercholesterolemia, unspecified: Secondary | ICD-10-CM | POA: Insufficient documentation

## 2013-08-15 DIAGNOSIS — T383X5A Adverse effect of insulin and oral hypoglycemic [antidiabetic] drugs, initial encounter: Secondary | ICD-10-CM | POA: Insufficient documentation

## 2013-08-15 DIAGNOSIS — M109 Gout, unspecified: Secondary | ICD-10-CM | POA: Insufficient documentation

## 2013-08-15 DIAGNOSIS — E119 Type 2 diabetes mellitus without complications: Secondary | ICD-10-CM | POA: Insufficient documentation

## 2013-08-15 DIAGNOSIS — J45909 Unspecified asthma, uncomplicated: Secondary | ICD-10-CM | POA: Insufficient documentation

## 2013-08-15 DIAGNOSIS — Z87442 Personal history of urinary calculi: Secondary | ICD-10-CM | POA: Insufficient documentation

## 2013-08-15 HISTORY — DX: Disorder of thyroid, unspecified: E07.9

## 2013-08-15 NOTE — ED Notes (Addendum)
Bottom left lip showing slight swelling around 2100 tonight.  Started on Synthroid 1 week ago.  Ate fish sandwich and FF tonight around 2030. Denies any difficulty breathing.

## 2013-08-16 MED ORDER — DIPHENHYDRAMINE HCL 25 MG PO CAPS
25.0000 mg | ORAL_CAPSULE | Freq: Once | ORAL | Status: AC
  Administered 2013-08-16: 25 mg via ORAL
  Filled 2013-08-16: qty 1

## 2013-08-16 NOTE — ED Notes (Signed)
Minor swelling of lower lip, left side. Wife states that he had a more pronounced oral swelling while taking another BP medication years ago.(cannot remember name of med).

## 2013-08-16 NOTE — ED Notes (Signed)
Lip swelling appears to have gone down, pt thinks so also. Explained waiting time for physician. Pt's wife disgruntled but willing to stay to be seen

## 2013-08-16 NOTE — Discharge Instructions (Signed)
You are on several medications which have been associated with angioedema. Januvia (sitagliptin) seems the most likely culprit, although several other medications could be the one. For now, please stop taking Januvia and follow up with either Dr. Hilma Favors or Dr. Lenna Gilford to discuss whether you should resume taking Januvia and whether you should stop some of your other medications. Return to the emergency department immediately if swelling is getting worse. The meantime, take either Claritin or Zyrtec once a day for the next 10 days.   Angioedema Angioedema is a sudden swelling of tissues, often of the skin. It can occur on the face or genitals or in the abdomen or other body parts. The swelling usually develops over a short period and gets better in 24 to 48 hours. It often begins during the night and is found when the person wakes up. The person may also get red, itchy patches of skin (hives). Angioedema can be dangerous if it involves swelling of the air passages.  Depending on the cause, episodes of angioedema may only happen once, come back in unpredictable patterns, or repeat for several years and then gradually fade away.  CAUSES  Angioedema can be caused by an allergic reaction to various triggers. It can also result from nonallergic causes, including reactions to drugs, immune system disorders, viral infections, or an abnormal gene that is passed to you from your parents (hereditary). For some people with angioedema, the cause is unknown.  Some things that can trigger angioedema include:   Foods.   Medicines, such as ACE inhibitors, ARBs, nonsteroidal anti-inflammatory agents, or estrogen.   Latex.   Animal saliva.   Insect stings.   Dyes used in X-rays.   Mild injury.   Dental work.  Surgery.  Stress.   Sudden changes in temperature.   Exercise. SIGNS AND SYMPTOMS   Swelling of the skin.  Hives. If these are present, there is also intense itching.  Redness in the  affected area.   Pain in the affected area.  Swollen lips or tongue.  Breathing problems. This may happen if the air passages swell.  Wheezing. If internal organs are involved, there may be:   Nausea.   Abdominal pain.   Vomiting.   Difficulty swallowing.   Difficulty passing urine. DIAGNOSIS   Your health care provider will examine the affected area and take a medical and family history.  Various tests may be done to help determine the cause. Tests may include:  Allergy skin tests to see if the problem is an allergic reaction.   Blood tests to check for hereditary angioedema.   Tests to check for underlying diseases that could cause the condition.   A review of your medicines, including over the counter medicines, may be done. TREATMENT  Treatment will depend on the cause of the angioedema. Possible treatments include:   Removal of anything that triggered the condition (such as stopping certain medicines).   Medicines to treat symptoms or prevent attacks. Medicines given may include:   Antihistamines.   Epinephrine injection.   Steroids.   Hospitalization may be required for severe attacks. If the air passages are affected, it can be an emergency. Tubes may need to be placed to keep the airway open. HOME CARE INSTRUCTIONS   Only take over-the-counter or prescription medicines as directed by your health care provider.  If you were given medicines for emergency allergy treatment, always carry them with you.  Wear a medical bracelet as directed by your health care provider.  Avoid known triggers. SEEK MEDICAL CARE IF:   You have repeat attacks of angioedema.   Your attacks are more frequent or more severe despite preventive measures.   You have hereditary angioedema and are considering having children. It is important to discuss the risks of passing the condition on to your children with your health care provider. SEEK IMMEDIATE MEDICAL  CARE IF:   You have severe swelling of the mouth, tongue, or lips.  You have difficulty breathing.   You have difficulty swallowing.   You faint. MAKE SURE YOU:  Understand these instructions.  Will watch your condition.  Will get help right away if you are not doing well or get worse. Document Released: 05/08/2001 Document Revised: 12/18/2012 Document Reviewed: 10/21/2012 [facility] Patient Information 2014 [place], [place].

## 2013-08-16 NOTE — ED Provider Notes (Signed)
CSN: [id number]     Arrival date & time 08/15/13  2203 History   First MD Initiated Contact with Patient 08/16/13 0252     Chief Complaint  Patient presents with  . Oral Swelling     (Consider location/radiation/quality/duration/timing/severity/associated sxs/prior Treatment) The history is provided by the patient.   73 year old male developed swelling of his lower lip on the left side at about 8:30 PM. He denies any other swelling. Denies any difficulty breathing or swallowing. Of note, he had to stop taking ACE inhibitors because of injury do not of the lower lip and the presentation was very similar. He states that the swelling seems to be going down although is not back to baseline. He did not take anything at home to try to treat his symptoms.  Past Medical History  Diagnosis Date  . Pulmonary nodule 04-11-11    "PT. NOT AWARE" -denies problems with breathing  . Hypertension   . DVT femoral (deep venous thrombosis) with thrombophlebitis 04-11-11    ?'09-tx. warfarin, pt being bridge with Lovenox 30mg  x2daily,x 5days  . Hypercholesteremia   . Hemorrhoids   . BPH (benign prostatic hypertrophy) with urinary obstruction   . Renal calculus   . DJD (degenerative joint disease)   . Gout 04-11-11     ankle 2 yrs ago  . Carpal tunnel syndrome   . Keloid 04-11-11    multiple-arms,back, chest  . Difficult intubation 04-11-11    08-11-97 some issues with intubation/record with chart  . GERD (gastroesophageal reflux disease) 04-11-11    mild, no med in 1 month  . DM (diabetes mellitus) 04-11-11    dx. 8-10 yrs. -oral meds ,cbg's(120-150)  . Asthma 04-11-11    AS CHILD ONLY  . Thyroid disease     hypothyroid   Past Surgical History  Procedure Laterality Date  . Prostatectomy  1999    Dr. Rosana Hoes  . Cosmetic surgery  2000    Keloid injections  . Ortho surgery on fingers  2005 & 2008    Dr. Lorelle Formosa finger release  . Cystoscopy with biopsy  04/14/2011    Procedure: CYSTOSCOPY WITH  BIOPSY;  Surgeon: Dutch Gray, MD;  Location: [facility];  Service: Urology;  Laterality: N/A;  Bladder Biopsies Biopsy of Prostatic Urethra    . Cystoscopy/retrograde/ureteroscopy  04/14/2011    Procedure: CYSTOSCOPY/RETROGRADE/URETEROSCOPY;  Surgeon: Dutch Gray, MD;  Location: [facility];  Service: Urology;  Laterality: Bilateral;  (Bil) RPG    History reviewed. No pertinent family history. History  Substance Use Topics  . Smoking status: Never Smoker   . Smokeless tobacco: Never Used  . Alcohol Use: Yes     Comment: social use    Review of Systems  All other systems reviewed and are negative.     Allergies  Ace inhibitors and Metformin and related  Home Medications   Prior to Admission medications   Medication Sig Start Date End Date Taking? Authorizing Provider  allopurinol (ZYLOPRIM) 300 MG tablet TAKE 1 TABLET BY MOUTH ONCE DAILY 06/06/13   Noralee Space, MD  amLODipine (NORVASC) 10 MG tablet TAKE 1 TABLET EVERY MORNING    Noralee Space, MD  fenofibrate 160 MG tablet TAKE 1 TABLET (160 MG TOTAL) BY MOUTH DAILY. 06/06/13   Noralee Space, MD  glimepiride (AMARYL) 4 MG tablet TAKE 1 TABLET EVERY DAY BEFORE BREAKFAST    Noralee Space, MD  glucose blood (ACCU-CHEK AVIVA PLUS) test strip Use as instructed once daily 03/19/12  Noralee Space, MD  Lancets (ACCU-CHEK MULTICLIX) lancets Use as instructed once daily 03/19/12   Noralee Space, MD  metoprolol (LOPRESSOR) 50 MG tablet TAKE 1 TABLET TWICE DAILY    Noralee Space, MD  Multiple Vitamin (MULITIVITAMIN WITH MINERALS) TABS Take 1 tablet by mouth daily. Mens One a Day.    Historical Provider, MD  omeprazole (PRILOSEC) 20 MG capsule TAKE ONE CAPSULE BY MOUTH ONCE DAILY AS NEEDED FOR STOMACH ACID 05/22/11   Noralee Space, MD  Polyethyl Glycol-Propyl Glycol (SYSTANE OP) Apply 1 drop to eye as needed. For dry eyes.    Historical Provider, MD  polyvinyl alcohol (LIQUIFILM TEARS) 1.4 % ophthalmic solution Place 1 drop into both eyes as needed. For  dry eyes.    Historical Provider, MD  pravastatin (PRAVACHOL) 40 MG tablet Take 1 tablet (40 mg total) by mouth daily. 06/06/13   Noralee Space, MD  sitaGLIPtin (JANUVIA) 100 MG tablet Take 1 tablet (100 mg total) by mouth daily. 06/30/13   Noralee Space, MD   BP 141/82  Pulse 60  Temp(Src) 98.1 F (36.7 C) (Oral)  Resp 16  Ht 6\' 1"  (1.854 m)  Wt 210 lb (95.255 kg)  BMI 27.71 kg/m2  SpO2 100% Physical Exam  Nursing note and vitals reviewed.  73 year old male, resting comfortably and in no acute distress. Vital signs are significant for borderline hypertension with blood pressure 141/82. Oxygen saturation is 100%, which is normal. Head is normocephalic and atraumatic. PERRLA, EOMI. Oropharynx is clear. There is mild angioedema of the lower lip. There is no lingual or sublingual swelling and no swelling of the posterior pharynx or uvula. He has no difficulty with secretions and no stridor and phonation is normal. Neck is nontender and supple without adenopathy or JVD. Back is nontender and there is no CVA tenderness. Lungs are clear without rales, wheezes, or rhonchi. Chest is nontender. Heart has regular rate and rhythm without murmur. Abdomen is soft, flat, nontender without masses or hepatosplenomegaly and peristalsis is normoactive. Extremities have no cyanosis or edema, full range of motion is present. Skin is warm and dry without rash. Neurologic: Mental status is normal, cranial nerves are intact, there are no motor or sensory deficits.  ED Course  Procedures (including critical care time)  MDM   Final diagnoses:  Angioedema of lips    Angioedema of the lower lip. He is on multiple medications which have been associated with angioedema but the most likely one would be Sitagliptin. He is advised to discontinue this and followup with his PCP in the next several days to discuss how he should adjust his medications. He is advised to take a second generation antihistamine for the  next 10 days. Steroids are not given because symptoms seemed to be spontaneously resolving, and concern about how steroids would affect his blood sugar.    Delora Fuel, MD [phone] [id number]

## 2013-08-18 ENCOUNTER — Telehealth: Payer: Self-pay | Admitting: Pulmonary Disease

## 2013-08-18 NOTE — Telephone Encounter (Signed)
Called spoke with pt. He reports his BS has been running 165-172.  He also reports BP has been around 123/72-181/80. He tests his BS every AM fasting. Sometimes at night when he tests his BS it is a little higher. He takes his meds as directed and daily as stated below on medication chart. Pt requesting recs. Please advise TP thanks  Current Outpatient Prescriptions on File Prior to Visit  Medication Sig Dispense Refill  . allopurinol (ZYLOPRIM) 300 MG tablet TAKE 1 TABLET BY MOUTH ONCE DAILY  90 tablet  0  . amLODipine (NORVASC) 10 MG tablet TAKE 1 TABLET EVERY MORNING  90 tablet  0  . fenofibrate 160 MG tablet TAKE 1 TABLET (160 MG TOTAL) BY MOUTH DAILY.  90 tablet  0  . glimepiride (AMARYL) 4 MG tablet TAKE 1 TABLET EVERY DAY BEFORE BREAKFAST  90 tablet  0  . glucose blood (ACCU-CHEK AVIVA PLUS) test strip Use as instructed once daily  50 each  11  . Lancets (ACCU-CHEK MULTICLIX) lancets Use as instructed once daily  100 each  11  . metoprolol (LOPRESSOR) 50 MG tablet TAKE 1 TABLET TWICE DAILY  180 tablet  0  . Multiple Vitamin (MULITIVITAMIN WITH MINERALS) TABS Take 1 tablet by mouth daily. Mens One a Day.      Marland Kitchen omeprazole (PRILOSEC) 20 MG capsule TAKE ONE CAPSULE BY MOUTH ONCE DAILY AS NEEDED FOR STOMACH ACID  30 capsule  5  . Polyethyl Glycol-Propyl Glycol (SYSTANE OP) Apply 1 drop to eye as needed. For dry eyes.      . polyvinyl alcohol (LIQUIFILM TEARS) 1.4 % ophthalmic solution Place 1 drop into both eyes as needed. For dry eyes.      . pravastatin (PRAVACHOL) 40 MG tablet Take 1 tablet (40 mg total) by mouth daily.  90 tablet  0  . sitaGLIPtin (JANUVIA) 100 MG tablet Take 1 tablet (100 mg total) by mouth daily.  90 tablet  1  . [DISCONTINUED] simvastatin (ZOCOR) 40 MG tablet Takes 1/2 tablet daily bedtime       No current facility-administered medications on file prior to visit.

## 2013-08-18 NOTE — Telephone Encounter (Signed)
I called spoke with pt. He reports he see's both SN and Dr. Hilma Favors for [facility] issues. He will call his office for recs since SN no longer practices [facility]. He reports he is better from 08/16/13 ED visit. nothignf urther needed

## 2013-08-18 NOTE — Telephone Encounter (Signed)
On chart his PCP says Dr. Hilma Favors has he switched over  ? Is his machine correct. ? Missed rx doses  Is he taking decongestants , NSAIDS anything that could increased b/p  Was in ER on 6/6 for angioedema did this go away??

## 2013-11-24 ENCOUNTER — Other Ambulatory Visit: Payer: Self-pay | Admitting: Pulmonary Disease

## 2013-12-03 ENCOUNTER — Other Ambulatory Visit: Payer: Self-pay | Admitting: Pulmonary Disease

## 2013-12-11 HISTORY — PX: OTHER SURGICAL HISTORY: SHX169

## 2013-12-22 ENCOUNTER — Encounter (HOSPITAL_COMMUNITY): Payer: Self-pay | Admitting: Emergency Medicine

## 2013-12-22 ENCOUNTER — Ambulatory Visit (HOSPITAL_COMMUNITY)
Admission: RE | Admit: 2013-12-22 | Discharge: 2013-12-22 | Disposition: A | Discharge status: Home / Self Care | Payer: Medicare HMO | Source: Ambulatory Visit | Attending: Internal Medicine | Admitting: Internal Medicine

## 2013-12-22 ENCOUNTER — Other Ambulatory Visit (HOSPITAL_COMMUNITY): Payer: Self-pay | Admitting: Internal Medicine

## 2013-12-22 ENCOUNTER — Emergency Department (HOSPITAL_COMMUNITY)
Admission: EM | Admit: 2013-12-22 | Discharge: 2013-12-22 | Disposition: A | Discharge status: Home / Self Care | Payer: Medicare HMO | Attending: Emergency Medicine | Admitting: Emergency Medicine

## 2013-12-22 DIAGNOSIS — Z872 Personal history of diseases of the skin and subcutaneous tissue: Secondary | ICD-10-CM | POA: Diagnosis not present

## 2013-12-22 DIAGNOSIS — E780 Pure hypercholesterolemia: Secondary | ICD-10-CM | POA: Insufficient documentation

## 2013-12-22 DIAGNOSIS — M109 Gout, unspecified: Secondary | ICD-10-CM | POA: Insufficient documentation

## 2013-12-22 DIAGNOSIS — I10 Essential (primary) hypertension: Secondary | ICD-10-CM | POA: Insufficient documentation

## 2013-12-22 DIAGNOSIS — M7989 Other specified soft tissue disorders: Principal | ICD-10-CM

## 2013-12-22 DIAGNOSIS — I82401 Acute embolism and thrombosis of unspecified deep veins of right lower extremity: Secondary | ICD-10-CM

## 2013-12-22 DIAGNOSIS — E079 Disorder of thyroid, unspecified: Secondary | ICD-10-CM | POA: Diagnosis not present

## 2013-12-22 DIAGNOSIS — K219 Gastro-esophageal reflux disease without esophagitis: Secondary | ICD-10-CM | POA: Insufficient documentation

## 2013-12-22 DIAGNOSIS — J45909 Unspecified asthma, uncomplicated: Secondary | ICD-10-CM | POA: Diagnosis not present

## 2013-12-22 DIAGNOSIS — Z79899 Other long term (current) drug therapy: Secondary | ICD-10-CM | POA: Insufficient documentation

## 2013-12-22 DIAGNOSIS — Z791 Long term (current) use of non-steroidal anti-inflammatories (NSAID): Secondary | ICD-10-CM | POA: Insufficient documentation

## 2013-12-22 DIAGNOSIS — Z87442 Personal history of urinary calculi: Secondary | ICD-10-CM | POA: Insufficient documentation

## 2013-12-22 DIAGNOSIS — E119 Type 2 diabetes mellitus without complications: Secondary | ICD-10-CM | POA: Insufficient documentation

## 2013-12-22 DIAGNOSIS — M79661 Pain in right lower leg: Secondary | ICD-10-CM

## 2013-12-22 DIAGNOSIS — Z87448 Personal history of other diseases of urinary system: Secondary | ICD-10-CM | POA: Diagnosis not present

## 2013-12-22 DIAGNOSIS — M79604 Pain in right leg: Secondary | ICD-10-CM | POA: Diagnosis present

## 2013-12-22 MED ORDER — RIVAROXABAN 15 MG PO TABS: 15.0000 mg | ORAL_TABLET | Freq: Two times a day (BID) | ORAL | Status: DC

## 2013-12-22 MED ORDER — RIVAROXABAN 15 MG PO TABS
15.0000 mg | ORAL_TABLET | Freq: Once | ORAL | Status: AC
  Administered 2013-12-22: 15 mg via ORAL
  Filled 2013-12-22: qty 1

## 2013-12-22 NOTE — ED Notes (Signed)
Patient sent from Dr. Gerarda Fraction for positive ultrasound for DVT.

## 2013-12-22 NOTE — ED Provider Notes (Signed)
CSN: [id number]     Arrival date & time 12/22/13  1234 History   First MD Initiated Contact with Patient 12/22/13 1303     Chief Complaint  Patient presents with  . DVT     (Consider location/radiation/quality/duration/timing/severity/associated sxs/prior Treatment) The history is provided by the patient.    Hector Rose is a 73 y.o. male who complains of pain and swelling in the right leg for several days. He denies chest pain or shortness of breath. He had a preceding left leg DVT, treated with warfarin, and stopped several years ago. He denies fever, chills, cough, shortness of breath, chest pain, weakness, or dizziness. There are no other known modifying factors.    Past Medical History  Diagnosis Date  . Pulmonary nodule 04-11-11    "PT. NOT AWARE" -denies problems with breathing  . Hypertension   . DVT femoral (deep venous thrombosis) with thrombophlebitis 04-11-11    ?'09-tx. warfarin, pt being bridge with Lovenox 30mg  x2daily,x 5days  . Hypercholesteremia   . Hemorrhoids   . BPH (benign prostatic hypertrophy) with urinary obstruction   . Renal calculus   . DJD (degenerative joint disease)   . Gout 04-11-11     ankle 2 yrs ago  . Carpal tunnel syndrome   . Keloid 04-11-11    multiple-arms,back, chest  . Difficult intubation 04-11-11    08-11-97 some issues with intubation/record with chart  . GERD (gastroesophageal reflux disease) 04-11-11    mild, no med in 1 month  . DM (diabetes mellitus) 04-11-11    dx. 8-10 yrs. -oral meds ,cbg's(120-150)  . Asthma 04-11-11    AS CHILD ONLY  . Thyroid disease     hypothyroid   Past Surgical History  Procedure Laterality Date  . Prostatectomy  1999    Dr. Rosana Hoes  . Cosmetic surgery  2000    Keloid injections  . Ortho surgery on fingers  2005 & 2008    Dr. Lorelle Formosa finger release  . Cystoscopy with biopsy  04/14/2011    Procedure: CYSTOSCOPY WITH BIOPSY;  Surgeon: Dutch Gray, MD;  Location: [facility];  Service: Urology;   Laterality: N/A;  Bladder Biopsies Biopsy of Prostatic Urethra    . Cystoscopy/retrograde/ureteroscopy  04/14/2011    Procedure: CYSTOSCOPY/RETROGRADE/URETEROSCOPY;  Surgeon: Dutch Gray, MD;  Location: [facility];  Service: Urology;  Laterality: Bilateral;  (Bil) RPG    History reviewed. No pertinent family history. History  Substance Use Topics  . Smoking status: Never Smoker   . Smokeless tobacco: Never Used  . Alcohol Use: Yes     Comment: social use    Review of Systems  All other systems reviewed and are negative.     Allergies  Ace inhibitors and Metformin and related  Home Medications   Prior to Admission medications   Medication Sig Start Date End Date Taking? Authorizing Provider  acetaminophen (TYLENOL) 500 MG tablet Take 1,000 mg by mouth every 6 (six) hours as needed for moderate pain.   Yes Historical Provider, MD  allopurinol (ZYLOPRIM) 300 MG tablet TAKE 1 TABLET BY MOUTH ONCE DAILY 06/06/13  Yes Noralee Space, MD  amLODipine (NORVASC) 10 MG tablet TAKE 1 TABLET EVERY MORNING   Yes Noralee Space, MD  fenofibrate 160 MG tablet TAKE 1 TABLET (160 MG TOTAL) BY MOUTH DAILY. 06/06/13  Yes Noralee Space, MD  glimepiride (AMARYL) 4 MG tablet TAKE 1 TABLET EVERY DAY BEFORE BREAKFAST   Yes Noralee Space, MD  metoprolol (LOPRESSOR) 50 MG  tablet TAKE 1 TABLET TWICE DAILY   Yes Noralee Space, MD  Multiple Vitamin (MULITIVITAMIN WITH MINERALS) TABS Take 1 tablet by mouth daily. Mens One a Day.   Yes Historical Provider, MD  naproxen sodium (ANAPROX) 220 MG tablet Take 220-440 mg by mouth daily as needed (pain).   Yes Historical Provider, MD  Omega-3 Fatty Acids (FISH OIL PO) Take 1 capsule by mouth daily.   Yes Historical Provider, MD  omeprazole (PRILOSEC) 20 MG capsule TAKE ONE CAPSULE BY MOUTH ONCE DAILY AS NEEDED FOR STOMACH ACID 05/22/11  Yes Noralee Space, MD  pravastatin (PRAVACHOL) 40 MG tablet Take 1 tablet (40 mg total) by mouth daily. 06/06/13  Yes Noralee Space, MD   sitaGLIPtin (JANUVIA) 100 MG tablet Take 1 tablet (100 mg total) by mouth daily. 06/30/13  Yes Noralee Space, MD  Rivaroxaban (XARELTO) 15 MG TABS tablet Take 1 tablet (15 mg total) by mouth 2 (two) times daily. 12/22/13   Richarda Blade, MD   BP 144/76  Pulse 63  Temp(Src) 98.2 F (36.8 C) (Oral)  Ht 6\' 1"  (1.854 m)  Wt 221 lb (100.245 kg)  BMI 29.16 kg/m2  SpO2 100% Physical Exam  Nursing note and vitals reviewed. Constitutional: He is oriented to person, place, and time. He appears well-developed and well-nourished.  HENT:  Head: Normocephalic and atraumatic.  Right Ear: External ear normal.  Left Ear: External ear normal.  Eyes: Conjunctivae and EOM are normal. Pupils are equal, round, and reactive to light.  Neck: Normal range of motion and phonation normal. Neck supple.  Cardiovascular: Normal rate, regular rhythm and normal heart sounds.   Pulmonary/Chest: Effort normal and breath sounds normal. He exhibits no bony tenderness.  Abdominal: Soft. There is no tenderness.  Musculoskeletal: Normal range of motion.  Moderate swelling, right leg from hip to toes. It is nontender to palpation. Left leg is not swollen  Neurological: He is alert and oriented to person, place, and time. No cranial nerve deficit or sensory deficit. He exhibits normal muscle tone. Coordination normal.  Skin: Skin is warm, dry and intact.  Psychiatric: He has a normal mood and affect. His behavior is normal. Judgment and thought content normal.    ED Course  Procedures (including critical care time) Medications  Rivaroxaban (XARELTO) tablet 15 mg (15 mg Oral Given 12/22/13 1347)    Patient Vitals for the past 24 hrs:  BP Temp Temp src Pulse SpO2 Height Weight  12/22/13 1344 - - - 63 100 % - -  12/22/13 1342 144/76 mmHg - - - - - -  12/22/13 1259 148/71 mmHg 98.2 F (36.8 C) Oral 59 100 % 6\' 1"  (1.854 m) 221 lb (100.245 kg)    Findings and treatment plan discussed with pt. And wife. All  questions answered.   Labs Review Labs Reviewed - No data to display  Imaging Review US Venous Img Lower Unilateral Right  12/22/2013   CLINICAL DATA:  Right leg pain and swelling  EXAM: RIGHT LOWER EXTREMITY VENOUS DUPLEX ULTRASOUND  TECHNIQUE: Doppler venous assessment of the right lower extremity deep venous system was performed, including characterization of spectral flow, compressibility, and phasicity.  COMPARISON:  None.  FINDINGS: There is partially eighth occlusive thrombus in the right common femoral and proximal femoral veins. These venous structures are partially compressible. A small amount of flow is seen surrounding the partially occlusive thrombus.  The mid femoral vein, distal femoral vein, and popliteal vein are noncompressible compatible with  DVT. The anterior tibial vein is patent. The greater saphenous vein is compressible. The posterior tibial and peroneal veins are noncompressible and filled with DVT.  Doppler analysis demonstrates respiratory phasicity.  IMPRESSION: There is extensive DVT in the right lower extremity. It is partially occlusive in the right common femoral vein and proximal femoral vein. It is occlusive in the mid femoral vein, distal femoral vein, and popliteal vein.   Electronically Signed   By: Maryclare Bean M.D.   On: 12/22/2013 12:24     EKG Interpretation None      MDM   Final diagnoses:  DVT (deep venous thrombosis), right    DVT without complicating features. Anticoagulation begun in the ED. Follow up with PCP is accessible.   Nursing Notes Reviewed/ Care Coordinated Applicable Imaging Reviewed Interpretation of Laboratory Data incorporated into ED treatment  The patient appears reasonably screened and/or stabilized for discharge and I doubt any other medical condition or other [facility] requiring further screening, evaluation, or treatment in the ED at this time prior to discharge.  Plan: Home Medications- Xarelto; Home Treatments- Rest,  elevation; return here if the recommended treatment, does not improve the symptoms; Recommended follow up- PCP 3 days and prn    Richarda Blade, MD 12/23/13 1144

## 2013-12-22 NOTE — ED Notes (Signed)
Patient with no complaints at this time. Respirations even and unlabored. Skin warm/dry. Discharge instructions reviewed with patient at this time. Patient given opportunity to voice concerns/ask questions. Patient discharged at this time and left Emergency Department with steady gait.   

## 2013-12-22 NOTE — Discharge Instructions (Signed)
Elevate both legs above her heart for at least 4 hours, each day. Start the prescription medication, tonight. It would be best to wait until about 10 PM to take the next dose. Your doctor will change the medication, after you complete 3 weeks, to a daily dose.   Deep Vein Thrombosis A deep vein thrombosis (DVT) is a blood clot that develops in the deep, larger veins of the leg, arm, or pelvis. These are more dangerous than clots that might form in veins near the surface of the body. A DVT can lead to serious and even life-threatening complications if the clot breaks off and travels in the bloodstream to the lungs.  A DVT can damage the valves in your leg veins so that instead of flowing upward, the blood pools in the lower leg. This is called post-thrombotic syndrome, and it can result in pain, swelling, discoloration, and sores on the leg. CAUSES Usually, several things contribute to the formation of blood clots. Contributing factors include:  The flow of blood slows down.  The inside of the vein is damaged in some way.  You have a condition that makes blood clot more easily. RISK FACTORS Some people are more likely than others to develop blood clots. Risk factors include:   Smoking.  Being overweight (obese).  Sitting or lying still for a long time. This includes long-distance travel, paralysis, or recovery from an illness or surgery. Other factors that increase risk are:   Older age, especially over 51 years of age.  Having a family history of blood clots or if you have already had a blot clot.  Having major or lengthy surgery. This is especially true for surgery on the hip, knee, or belly (abdomen). Hip surgery is particularly high risk.  Having a long, thin tube (catheter) placed inside a vein during a medical procedure.  Breaking a hip or leg.  Having cancer or cancer treatment.  Pregnancy and childbirth.  Hormone changes make the blood clot more easily during  pregnancy.  The fetus puts pressure on the veins of the pelvis.  There is a risk of injury to veins during delivery or a caesarean delivery. The risk is highest just after childbirth.  Medicines containing the male hormone estrogen. This includes birth control pills and hormone replacement therapy.  Other circulation or heart problems.  SIGNS AND SYMPTOMS When a clot forms, it can either partially or totally block the blood flow in that vein. Symptoms of a DVT can include:  Swelling of the leg or arm, especially if one side is much worse.  Warmth and redness of the leg or arm, especially if one side is much worse.  Pain in an arm or leg. If the clot is in the leg, symptoms may be more noticeable or worse when standing or walking. The symptoms of a DVT that has traveled to the lungs (pulmonary embolism, PE) usually start suddenly and include:  Shortness of breath.  Coughing.  Coughing up blood or blood-tinged mucus.  Chest pain. The chest pain is often worse with deep breaths.  Rapid heartbeat. Anyone with these symptoms should get emergency medical treatment right away. Do not wait to see if the symptoms will go away. Call your local emergency services (911 in the [place] if you have these symptoms. Do not drive yourself to the hospital. DIAGNOSIS If a DVT is suspected, your health care provider will take a full medical history and perform a physical exam. Tests that also may be required include:  Blood tests, including studies of the clotting properties of the blood.  Ultrasound to see if you have clots in your legs or lungs.  X-rays to show the flow of blood when dye is injected into the veins (venogram).  Studies of your lungs if you have any chest symptoms. PREVENTION  Exercise the legs regularly. Take a brisk 30-minute walk every day.  Maintain a weight that is appropriate for your height.  Avoid sitting or lying in bed for long periods of time without moving your  legs.  Women, particularly those over the age of 37 years, should consider the risks and benefits of taking estrogen medicines, including birth control pills.  Do not smoke, especially if you take estrogen medicines.  Long-distance travel can increase your risk of DVT. You should exercise your legs by walking or pumping the muscles every hour.  Many of the risk factors above relate to situations that exist with hospitalization, either for illness, injury, or elective surgery. Prevention may include medical and nonmedical measures.  Your health care provider will assess you for the need for venous thromboembolism prevention when you are admitted to the hospital. If you are having surgery, your surgeon will assess you the day of or day after surgery. TREATMENT Once identified, a DVT can be treated. It can also be prevented in some circumstances. Once you have had a DVT, you may be at increased risk for a DVT in the future. The most common treatment for DVT is blood-thinning (anticoagulant) medicine, which reduces the blood's tendency to clot. Anticoagulants can stop new blood clots from forming and stop old clots from growing. They cannot dissolve existing clots. Your body does this by itself over time. Anticoagulants can be given by mouth, through an IV tube, or by injection. Your health care provider will determine the best program for you. Other medicines or treatments that may be used are:  Heparin or related medicines (low molecular weight heparin) are often the first treatment for a blood clot. They act quickly. However, they cannot be taken orally and must be given either in shot form or by IV tube.  Heparin can cause a fall in a component of blood that stops bleeding and forms blood clots (platelets). You will be monitored with blood tests to be sure this does not occur.  Warfarin is an anticoagulant that can be swallowed. It takes a few days to start working, so usually heparin or related  medicines are used in combination. Once warfarin is working, heparin is usually stopped.  Factor Xa inhibitor medicines, such as rivaroxaban and apixaban, also reduce blood clotting. These medicines are taken orally and can often be used without heparin or related medicines.  Less commonly, clot dissolving drugs (thrombolytics) are used to dissolve a DVT. They carry a high risk of bleeding, so they are used mainly in severe cases where your life or a part of your body is threatened.  Very rarely, a blood clot in the leg needs to be removed surgically.  If you are unable to take anticoagulants, your health care provider may arrange for you to have a filter placed in a main vein in your abdomen. This filter prevents clots from traveling to your lungs. HOME CARE INSTRUCTIONS  Take all medicines as directed by your health care provider.  Learn as much as you can about DVT.  Wear a medical alert bracelet or carry a medical alert card.  Ask your health care provider how soon you can go back to  normal activities. It is important to stay active to prevent blood clots. If you are on anticoagulant medicine, avoid contact sports.  It is very important to exercise. This is especially important while traveling, sitting, or standing for long periods of time. Exercise your legs by walking or by tightening and relaxing your leg muscles regularly. Take frequent walks.  You may need to wear compression stockings. These are tight elastic stockings that apply pressure to the lower legs. This pressure can help keep the blood in the legs from clotting. Taking Warfarin Warfarin is a daily medicine that is taken by mouth. Your health care provider will advise you on the length of treatment (usually 3-6 months, sometimes lifelong). If you take warfarin:  Understand how to take warfarin and foods that can affect how warfarin works in Veterinary surgeon.  Too much and too little warfarin are both dangerous. Too much warfarin  increases the risk of bleeding. Too little warfarin continues to allow the risk for blood clots. Warfarin and Regular Blood Testing While taking warfarin, you will need to have regular blood tests to measure your blood clotting time. These blood tests usually include both the prothrombin time (PT) and international normalized ratio (INR) tests. The PT and INR results allow your health care provider to adjust your dose of warfarin. It is very important that you have your PT and INR tested as often as directed by your health care provider.  Warfarin and Your Diet Avoid major changes in your diet, or notify your health care provider before changing your diet. Arrange a visit with a registered dietitian to answer your questions. Many foods, especially foods high in vitamin K, can interfere with warfarin and affect the PT and INR results. You should eat a consistent amount of foods high in vitamin K. Foods high in vitamin K include:   Spinach, kale, broccoli, cabbage, collard and turnip greens, Brussels sprouts, peas, cauliflower, seaweed, and parsley.  Beef and pork liver.  Green tea.  Soybean oil. Warfarin with Other Medicines Many medicines can interfere with warfarin and affect the PT and INR results. You must:  Tell your health care provider about any and all medicines, vitamins, and supplements you take, including aspirin and other over-the-counter anti-inflammatory medicines. Be especially cautious with aspirin and anti-inflammatory medicines. Ask your health care provider before taking these.  Do not take or discontinue any prescribed or over-the-counter medicine except on the advice of your health care provider or pharmacist. Warfarin Side Effects Warfarin can have side effects, such as easy bruising and difficulty stopping bleeding. Ask your health care provider or pharmacist about other side effects of warfarin. You will need to:  Hold pressure over cuts for longer than usual.  Notify  your dentist and other health care providers that you are taking warfarin before you undergo any procedures where bleeding may occur. Warfarin with Alcohol and Tobacco   Drinking alcohol frequently can increase the effect of warfarin, leading to excess bleeding. It is best to avoid alcoholic drinks or to consume only very small amounts while taking warfarin. Notify your health care provider if you change your alcohol intake.   Do not use any tobacco products including cigarettes, chewing tobacco, or electronic cigarettes. If you smoke, quit. Ask your health care provider for help with quitting smoking. Alternative Medicines to Warfarin: Factor Xa Inhibitor Medicines  These blood-thinning medicines are taken by mouth, usually for several weeks or longer. It is important to take the medicine every single day at the  same time each day.  There are no regular blood tests required when using these medicines.  There are fewer food and drug interactions than with warfarin.  The side effects of this class of medicine are similar to those of warfarin, including excessive bruising or bleeding. Ask your health care provider or pharmacist about other potential side effects. SEEK MEDICAL CARE IF:  You notice a rapid heartbeat.  You feel weaker or more tired than usual.  You feel faint.  You notice increased bruising.  You feel your symptoms are not getting better in the time expected.  You believe you are having side effects of medicine. SEEK IMMEDIATE MEDICAL CARE IF:  You have chest pain.  You have trouble breathing.  You have new or increased swelling or pain in one leg.  You cough up blood.  You notice blood in vomit, in a bowel movement, or in urine. MAKE SURE YOU:  Understand these instructions.  Will watch your condition.  Will get help right away if you are not doing well or get worse. Document Released: 02/27/2005 Document Revised: 07/14/2013 Document Reviewed:  11/04/2012 [facility] Patient Information 2015 [place], [place]. This information is not intended to replace advice given to you by your health care provider. Make sure you discuss any questions you have with your health care provider.

## 2013-12-30 ENCOUNTER — Ambulatory Visit: Payer: Commercial Managed Care - HMO | Admitting: Pulmonary Disease

## 2014-01-13 ENCOUNTER — Other Ambulatory Visit: Payer: Self-pay | Admitting: Pulmonary Disease

## 2014-01-15 ENCOUNTER — Ambulatory Visit (INDEPENDENT_AMBULATORY_CARE_PROVIDER_SITE_OTHER): Payer: Commercial Managed Care - HMO | Admitting: Pulmonary Disease

## 2014-01-15 ENCOUNTER — Other Ambulatory Visit (INDEPENDENT_AMBULATORY_CARE_PROVIDER_SITE_OTHER): Payer: Commercial Managed Care - HMO

## 2014-01-15 ENCOUNTER — Encounter: Payer: Self-pay | Admitting: Pulmonary Disease

## 2014-01-15 VITALS — BP 104/60 | HR 65 | Temp 98.4°F | Ht 72.0 in | Wt 222.0 lb

## 2014-01-15 DIAGNOSIS — N138 Other obstructive and reflux uropathy: Secondary | ICD-10-CM

## 2014-01-15 DIAGNOSIS — E7800 Pure hypercholesterolemia, unspecified: Secondary | ICD-10-CM

## 2014-01-15 DIAGNOSIS — M150 Primary generalized (osteo)arthritis: Secondary | ICD-10-CM

## 2014-01-15 DIAGNOSIS — K219 Gastro-esophageal reflux disease without esophagitis: Secondary | ICD-10-CM

## 2014-01-15 DIAGNOSIS — E119 Type 2 diabetes mellitus without complications: Secondary | ICD-10-CM

## 2014-01-15 DIAGNOSIS — N401 Enlarged prostate with lower urinary tract symptoms: Secondary | ICD-10-CM

## 2014-01-15 DIAGNOSIS — F419 Anxiety disorder, unspecified: Secondary | ICD-10-CM

## 2014-01-15 DIAGNOSIS — N183 Chronic kidney disease, stage 3 (moderate): Secondary | ICD-10-CM

## 2014-01-15 DIAGNOSIS — I10 Essential (primary) hypertension: Secondary | ICD-10-CM

## 2014-01-15 DIAGNOSIS — J948 Other specified pleural conditions: Secondary | ICD-10-CM

## 2014-01-15 DIAGNOSIS — Z794 Long term (current) use of insulin: Secondary | ICD-10-CM

## 2014-01-15 DIAGNOSIS — M1A09X0 Idiopathic chronic gout, multiple sites, without tophus (tophi): Secondary | ICD-10-CM

## 2014-01-15 DIAGNOSIS — I82401 Acute embolism and thrombosis of unspecified deep veins of right lower extremity: Secondary | ICD-10-CM

## 2014-01-15 DIAGNOSIS — E1122 Type 2 diabetes mellitus with diabetic chronic kidney disease: Secondary | ICD-10-CM | POA: Insufficient documentation

## 2014-01-15 DIAGNOSIS — N1831 Chronic kidney disease, stage 3a: Secondary | ICD-10-CM | POA: Insufficient documentation

## 2014-01-15 DIAGNOSIS — M159 Polyosteoarthritis, unspecified: Secondary | ICD-10-CM

## 2014-01-15 LAB — CBC WITH DIFFERENTIAL/PLATELET
Basophils Absolute: 0 10*3/uL (ref 0.0–0.1)
Basophils Relative: 0.3 % (ref 0.0–3.0)
EOS PCT: 3 % (ref 0.0–5.0)
Eosinophils Absolute: 0.2 10*3/uL (ref 0.0–0.7)
HEMATOCRIT: 41.4 % (ref 39.0–52.0)
HEMOGLOBIN: 13.9 g/dL (ref 13.0–17.0)
LYMPHS ABS: 1.7 10*3/uL (ref 0.7–4.0)
Lymphocytes Relative: 21.1 % (ref 12.0–46.0)
MCHC: 33.5 g/dL (ref 30.0–36.0)
MCV: 90.9 fl (ref 78.0–100.0)
MONOS PCT: 7.3 % (ref 3.0–12.0)
Monocytes Absolute: 0.6 10*3/uL (ref 0.1–1.0)
NEUTROS ABS: 5.6 10*3/uL (ref 1.4–7.7)
Neutrophils Relative %: 68.3 % (ref 43.0–77.0)
Platelets: 163 10*3/uL (ref 150.0–400.0)
RBC: 4.55 Mil/uL (ref 4.22–5.81)
RDW: 13.6 % (ref 11.5–15.5)
WBC: 8.2 10*3/uL (ref 4.0–10.5)

## 2014-01-15 LAB — HEMOGLOBIN A1C: Hgb A1c MFr Bld: 8.9 % — ABNORMAL HIGH (ref 4.6–6.5)

## 2014-01-15 LAB — BASIC METABOLIC PANEL
BUN: 14 mg/dL (ref 6–23)
CALCIUM: 9.8 mg/dL (ref 8.4–10.5)
CO2: 27 mEq/L (ref 19–32)
Chloride: 105 mEq/L (ref 96–112)
Creatinine, Ser: 1.1 mg/dL (ref 0.4–1.5)
GFR: 82.72 mL/min (ref >60.00)
GLUCOSE: 131 mg/dL — AB (ref 70–99)
Potassium: 4.7 mEq/L (ref 3.5–5.1)
SODIUM: 142 meq/L (ref 135–145)

## 2014-01-15 LAB — HEPATIC FUNCTION PANEL
ALK PHOS: 69 U/L (ref 39–117)
ALT: 27 U/L (ref 0–53)
AST: 30 U/L (ref 0–37)
Albumin: 3.9 g/dL (ref 3.5–5.2)
Bilirubin, Direct: 0.1 mg/dL (ref 0.0–0.3)
Total Bilirubin: 0.8 mg/dL (ref 0.2–1.2)
Total Protein: 7.4 g/dL (ref 6.0–8.3)

## 2014-01-15 LAB — LIPID PANEL
CHOLESTEROL: 179 mg/dL (ref 0–200)
HDL: 33.8 mg/dL — ABNORMAL LOW (ref >39.00)
LDL CALC: 115 mg/dL — AB (ref 0–99)
NonHDL: 145.2
Total CHOL/HDL Ratio: 5
Triglycerides: 153 mg/dL — ABNORMAL HIGH (ref 0.0–149.0)
VLDL: 30.6 mg/dL (ref 0.0–40.0)

## 2014-01-15 LAB — TSH: TSH: 0.87 u[IU]/mL (ref 0.35–4.50)

## 2014-01-15 LAB — PSA: PSA: 1.19 ng/mL (ref 0.10–4.00)

## 2014-01-15 MED ORDER — SITAGLIPTIN PHOSPHATE 100 MG PO TABS: 100.0000 mg | ORAL_TABLET | Freq: Every day | ORAL | Status: DC

## 2014-01-15 NOTE — Patient Instructions (Signed)
Today we updated your med list in our EPIC system...    Continue your current medications the same...  Today we rechecked your FASTING blood work...    We will contact you w/ the results when available...   We will check w/ drGolding & Fusco regarding your primary care needs...  Let's plan a follow up visit in 15mo for a pulmonary recheck...  Please let me know if i can be of service in any way.Marland KitchenMarland Kitchen

## 2014-01-15 NOTE — Progress Notes (Signed)
Subjective:    Patient ID: Hector Rose, male    DOB: Oct 07, 1940, 73 y.o.   MRN: [id number]  HPI 73 y/o BM here for a follow up visit... he has mult med prob as noted below... ~  SEE PREV EPIC NOTES FOR OLDER DATA >>   ~  June 12, 2012:  89mo ROV & Hector Rose is c/o some left jaw pain "like it wants to lock-up on me", exam is neg & rec to use hot soaks/ take Mobic15 as needed (& Prilosec); if symptoms persist he will check w/ his dentist... We reviewed the following medical problems during today's office visit >> Note: he also sees DrGolding in [place]...    Known right base nodule> granuloma RLL w/o change on serial CXRs; last film 9/12 ==> clear, NAD; denies cough, sput, hemoptysis, SOB, etc...    HBP> on Metop50Bid & Amlod10; BP= 108/72 & he is asymptomatic w/o CP, palpit, HA, dizzy, SOB, edema, etc...    Hx DVT> this occurred in 2007 & treated in [place] by [facility]- since last [place] he had a conversation w/ DrGolding & they have stopped the Coumadin, doing well off this med so far...    Lipids> on Feno160 for TG~300 & diet; FLP 3/14 shows TChol 214, TG 143, HDL 44, LDL 148; Rec to add PRAVASTATIN40 + better diet!    DM> on Metformin 500mg Bid & Glimep1mg /d; BS=113, A1c= 8.3; Rec to INCR Glimep1mg => 2mg /d & advised better diet & weight reduction...    GI> on Omep20 as needed & up to date on colon screening...    GU> eval for hematuria by DrBorden; rechecked 12/13 & stable w/ clear urine, PSA=1.0, & felt to be doing well...    Hx DJD/ Gout> no prob since starting on the Allopurinol 300mg /d, & has Mobic15 for prn use per DrGolding... We reviewed prob list, meds, xrays and labs> see below for updates >>   LABS 3/14:  FLP- not at goals on Feno160 & rec to add Prav40;  Chems- fairBS=113 A1c=8.3 Cr=1.3   ~  December 30, 2012:  87mo ROV & Hector Rose has noted that his BS has been running 150-200 at home- weight is up 5# despite diet efforts, on Metform & Glimep, we reviewed low carb diet & "the glycemic  index"... We reviewed the following medical problems during today's office visit >>     Known right base nodule> granuloma RLL w/o change on serial CXRs; CXR 10/14 => NAD; he denies cough, sput, hemoptysis, SOB, etc...    HBP> on Metop50Bid & Amlod10; BP= 124/70 & he is asymptomatic w/o CP, palpit, HA, dizzy, SOB, edema, etc...    Hx DVT> this occurred in 2007 & treated in [place] by [facility]- off Coumadin now per DrGolding & doing well off this med so far...    Lipids> on Prav40, CoQ10, & Feno160 for TG~300 & diet; FLP 10/14 shows TChol 171, TG 130, HDL 45, LDL 100; numbers improved on meds, now needs to lose wt!    DM> on Metformin 500mg Bid & Glimep2mg /d; Labs 10/14 showed BS=153, A1c= 8.6; Rec to INCR Metform2Bid & Glimep4mg /d...    GI> on Omep20 as needed & up to date on colon screening...    GU> eval for hematuria by DrBorden; rechecked 12/13 & stable w/ clear urine, PSA=0.86, & felt to be doing well, requests Viagra...    Hx DJD/ Gout> no prob since starting on the Allopurinol 300mg /d, & uses OTC analgesics as needed... We reviewed prob list, meds, xrays and labs>  see below for updates >>   CXR 10/14 showed stable heart size, clear lungs x right base scarring, mild degen changes in Tspine, NAD...  LABS 10/14:  FLP- at goals on Prav40+Feno160;  Chems- ok x BS=153, A1C=8.6;  CBC=wnl;  TSH=1.74;  PSA=0.86...   ~  June 30, 2013:  44mo ROV & Hector Rose indicates that he is feeling well and "doing good";  Info in EPIC indicates that he went to the ER at [facility] 12/14 but there are no ER notes to review, he had CP & was seen by DrGolding in [place]- we do not have access to his notes- pt indicates that several tests were done & he was sent to DrStClair in [place] for Cards eval & Myoview; I checked "care everywhere" in epic and EKG reported Sinus brady, 1st degree AVB, borderline tracing, but Myoview results not avail (pt indicates that it was OK) & he has not had any further check discomfort... We  reviewed the following medical problems during today's office visit >>     Known right base nodule> lesion on right hemidiaph assoc w/ some pleural calcif (no known asbestos exposure) ?granuloma w/o change on serial CXRs; he denies cough, sput, hemoptysis, SOB, edema, etc...    HBP> on Metop50Bid & Amlod10; BP= 130/80 & he is asymptomatic w/o CP, palpit, HA, dizzy, SOB, edema, etc...    Chest pain hx> eval by DrGolding in [place] in [place]> we do not have their records, pt reports that Myoview was OK but prev Myoview 2009 by DrWall showed some HK & EF=47%...    Hx DVT> this occurred in 2007 & treated in [place] by [facility]- off Coumadin now per DrGolding & doing well off this med so far...    Lipids> on Prav40, CoQ10, & Feno160 for TG~300 & diet; FLP 10/14 shows TChol 171, TG 130, HDL 45, LDL 100; numbers improved on meds, now needs to lose wt!    DM> supposed to be on Metformin500-2Bid & Glimep4mg /d; but only taking Metform500/d due to dose dependent itching on 4/d and 2/d per pt hx "it makes me itch so bad"; Labs 4/15 showed BS=136, A1c=7.3 & we decided to STOP the Metform & try Glimep4mg Qam & Januvia100mg Qpm...     GI> on Omep20 as needed & up to date on colon screening...    GU> eval for hematuria by DrBorden; rechecked 12/13 & stable w/ clear urine, PSA=0.86, & felt to be doing well, requests Viagra...    Hx DJD/ Gout> no prob since starting on the Allopurinol 300mg /d, & known Spine dis w/ diffuse spondylosis & osteophtes etc; uses OTC analgesics as needed... We reviewed prob list, meds, xrays and labs> see below for updates >> studies ordered by DrGolding 12/14-2/15>>   CXR 12/14 showed norm heart size, mild tortuosity of Ao, density on right hemidiaph- unchanged from old films, otherw clear, min pleural thickening...  PFT done 3/15 at [facility]:  FVC=3.06 (70%), FEV1=2.05 (62%) & 9% improvement after bronchodil, %1sec=67, mid-flows=35% predicted; LungVolumes showed VC=61%,  RV=115%, w/ RV/TLC=54 (149%predicted); DLCO reduced at 55% but was wnl after correction for VA... This is c/w mild airflow obstruction w/ superimposed mod restriction w/ air trapping...   ABGs on RA showed pH=7.45, pCO2=34, pO2=102  EKG & Myoview done by DrStClair in [place] & results not avail in epic....  I-131 Thyroid Uptake & Scan done 3/15 instead of a thyroid ultrasound> 24h uptake sl low at 7%, homogeneous uptake bilat, no focal abn seen...  CT Chest done  2/15 showed calcified pleural plaque on the right ? prior asbestos exposure, no adenopathy, mult low-attenuation nodules in right lobe of thyroid up to 1.1cm, multilevel spondylosis in Tspine (& a lucent lesion in T2 ?etiology), and "nothing to explain pt's ant CP"...   ~  January 15, 2014:  79mo ROV & Hector Rose was diagnosed & treated for a recurrent DVT 10/15- he presented to DrFusco & Golding's office w/ pain & swelling in his right leg for several days, no CP, palpit, cough, hemoptysis, or SOB;  Prev dx w/ DVT in 2007 & on Coumadin for many yrs, f/u VenDopplers were neg & Coumadin was stopped in 2014;  He did well for >30yr until this episode, no known ppt event & no known hypercoag state;  This time he has been started on Xarelto15Bid & he has f/u appt w/ DrGolding et al soon for recheck & switch to Xarelto20/d...     BP is controlled on Metop50Bid & Amlod10; Hx angioedema from ACE; BP= 110/60 range & he denies CP, palpit, dizzy, SOB, edema, etc...    Lipids are controlled on Prav40 & off prev Feno160; FLP today shows TChol179, TG 153, HDL 34, LDL 115... REC same med, better low fat diet, get wt down & incr exercise...    DM treated w/ Glimep4 & Januvia100; he is INTOL to Metformin w/ pruritis; Labs today showed BS=131, A1c= 8.9; weight is stable at 222# w/ BMI=30; additional meds are expensive & he doesn't want insulin, only option is to lose wt!    Thyroid nodules> several right thyroid nodules seen on CT Chest & he was sent to [place],  Endocrine for eval; he reports bx that was neg, we do not have notes from her, he continues thyroid & I suggested DM f/u for Endocrine as well.      He has several Ortho complaints and is seeing DrGioffre for right hip pain; on Naprosyn & sched for injection soon... We reviewed prob list, meds, xrays and labs> see below for updates >>   LABS 11/15:  FLP ok x HYDL=34 LDL=115;  Chems- ok x BS=131 A1c=8.9;  CBC- wnl;  TSH=0.87;  PSA=1.19... PLAN>> he has f/u [facility] in [place] for Xarelto & DVT; he has mult specialists tending his needs; continue same meds- he needs better DM control but doesn't want insulin and cost is a factor for additional meds; I am retiring from the Primary Care side of my practice & have suggested f/u by [place] .. I will see him back in 74mo for Pulm f/u w/ CXR...           PROBLEM LIST:    PULMONARY NODULE (ICD-518.89) - vague nodular opacity right base over diaphragm on old films... ~  1.7 cm benign ?granuloma... no change on serial CXR or CT's back to 2000. ~  CXR 2/10 is WNL- nodule not seen... ~  CXR 9/10 showed tort Ao, Cor WNL, lungs clear w/o nodule identified. ~  CXR 9/11 showed clear lungs, NAD.Marland Kitchen. ~  CXR 9/12 showed clear lungs, NAD.Marland Kitchen. ~  CXR 10/14 showed stable heart size, clear lungs x right base scarring, mild degen changes in Tspine, NAD.Marland Kitchen. ~  CXR 12/14 showed norm heart size, mild tortuosity of Ao, density on right hemidiaph- unchanged from old films, otherw clear, min pleural thickening... ~  CT Chest 2/15 showed calcified pleural plaque on the right ? prior asbestos exposure, no adenopathy, mult low-attenuation nodules in right lobe of thyroid up to 1.1cm,  multilevel spondylosis in Tspine (& a lucent lesion in T2 ?etiology), and "nothing to explain pt's ant CP"...  ~  PFT done 3/15 at [facility]:  FVC=3.06 (70%), FEV1=2.05 (62%) & 9% improvement after bronchodil, %1sec=67, mid-flows=35% predicted; LungVolumes showed VC=61%, RV=115%,  w/ RV/TLC=54 (149%predicted); DLCO reduced at 55% but was wnl after correction for VA... This is c/w mild airflow obstruction w/ superimposed mod restriction w/ air trapping...  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  HYPERTENSION (ICD-401.9) - controlled on TOPROL 50mg Bid and NORVASC 10mg /d... BP's at home in the 120-130/70 range, and measures 120/78 today- he denies HA, fatigue, visual changes, CP, palipit, dizziness, syncope, dyspnea, edema, etc. ~  he had a neg Cardiolite study in 10/04- no ischemia and EF=52%. ~  Aug09: eval DrWall for atyp CP & risk factors> Myoview 11/01/07 showed no evid for infarct, +diaph attenuation, EF sl reduced at 47%. ~  EKG 1/13 showed NSR, rate67, WNL, NAD... ~  4/14: on Metop50Bid & Amlod10; BP= 108/72 & he is asymptomatic w/o CP, palpit, HA, dizzy, SOB, edema, etc. ~  10/14: on Metop50Bid & Amlod10; BP= 124/70 & he remains essentially asymptomatic... ~  4/15: on Metop50Bid & Amlod10; BP= 130/80 & he is asymptomatic w/o CP, palpit, HA, dizzy, SOB, edema, etc. ~  11/15: on Metform50Bid * Amlod10; VP= 110/60 & he remains asymptomatic...  DEEP VENOUS THROMBOPHLEBITIS (ICD-453.40) - Dx'd by LMD in [place] in 3/07... [facility] and had pain/ swelling L calf... ultrasound at [facility] + for DVT & Rx COUMADIN by DrGolding (protimes q month)- no recent problems, he will check w/ DrGolding about discontinuing the Coumadin. ~  3/12:  Pt remains on Coumadin 62yrs after his single episode unprovoked DVT left leg, followed by DrGolding... ~  3/13:  Pt remains on Coumadin & I have suggested that he discuss coming off this med w/ his LMD, DrGolding... ~  4/14:  DrGolding has stopped his Coumadin in the interval- doing well so far w/o recurrent DVT... ~  4/15: he remains off Coumadin & doing well, w/o recurrent DVT... ~  10/15:  he presented to DrFusco&Golding w/ right leg swelling x1wk; VenDopplers were pos for extensive DVT; no obvious ppt event & no known hypercoag state; started on Xarelto & followed in [place]...  HYPERCHOLESTEROLEMIA (ICD-272.0) - controlled on SIMVASTATIN 40mg /d & FISH OIL...  ~  [place] 9/08 showed TChol 148, TG 200, HDL 30, LDL 78... on Advicor 500-20 at that time- keep same. ~  [place] 3/09 showed TChol 214, TG 236, HDL 44, LDL 114... on Advicor- rec change to Simvastatin40. ~  FLP 7/09 showed TChol 129, Tg 188, HDL 35, LDL 57... rec- keep same. ~  FLP 1/10 showed TChol 142, TG 165, HDL 35, LDL 74... stable on Simva40. ~  FLP 3/11 showed TChol 127, TG 147, HDL 34, LDL 64 ~  FLP 9/11 showed TChol 143, TG 151, HDL 36, LDL 77 ~  FLP 3/12 on Simva40 showed TChol 161, TG 103, HDL 43, LDL 98 ~  FLP 9/12 on Simva40 showed TChol 196, TG 119, HDL 49, LDL 123... Same med, better diet effort. ~  FLP 3/13 off Simva showed TChol 196, TG 303, HDL 45, LDL 85... Needs better low fat diet & wt reduction. ~  FLP 9/13 on diet alone showed TChol 175,  TG 304, HDL 38, LDL 70... rec to add FENOFIBRATE 160mg /d... ~  FLP 3/14 on Feno160 showed TChol 214, TG 143, HDL 44, LDL 148... rec to add PRAVASTATIN40 ~  FLP 10/14 on Prav40+Feno160 showed TChol 171, TG 130, HDL 45, LDL 100 => he stopped the Feno in the interval... ~  FLP 11/15 on Prav40 showed TChol179, TG 153, HDL 34, LDL 115... REC same med, better low fat diet, get wt down & incr exercise.  DM (ICD-250.00) >>  ~  on METFORMIN 500mg Bid & GLIMEPIRIDE 1mg /d (NOTE: hx prev hypoglycemia from Glucovance). ~  labs 9/08 showed FBS=106 and HgA1C=6.5.Marland KitchenMarland Kitchen on Glucovance 1/2 Bid. ~  labs 3/09 showed BS= 99, HgA1c= 7.0.Marland KitchenMarland Kitchen on Glucovance 1/2 Qam only- rec to incr to Bid again. ~  labs 7/09 showed BS= 104, HgA1c= 6.8.Marland KitchenMarland Kitchen rec- keep same. ~  pt reports sugars too low ~50 at 11AM if he takes even 1/2 tab in AM, therefore he changed to 1/2 at dinner only... ~  labs 1/10 showed BS= 125,  A1c= 7.0 ~  labs 9/10 showed BS= 102, A1c= 6.8 ~  labs 3/11 showed BS= 110, A1c= 7.0.Marland KitchenMarland Kitchen continue same meds, get wt down! ~  labs 9/11 showed BS= 129, A1c= 7.7.Marland KitchenMarland Kitchen rec change to METFORMIN ER 500mg  Qam & GLIMEPIRIDE 1mg  Qam. ~  labs 3/12 showed BS= 113, A1c= 9.0.Marland KitchenMarland Kitchen rec to double meds> Metform500Bid, Glim2mg AM (?he never incr the Amaryl) ~  Labs 9/12 showed BS= 120, A1c= 7.2.Marland KitchenMarland Kitchen Keep same (?on Glimep1mg ?) + low carb diet. ~  Labs 3/13 on Metform500Bid+Glim1 showed BS=120, A1c=7.4.Marland KitchenMarland Kitchen Keep same med, better diet, get wt down ~  Labs 9/13 on Metform500Bid+Glim1 showed BS=125, A1c=7.1 ~  Labs 4/14 on Metform500Bid+Glim1 showed BS=113, A1c=8.3.Marland KitchenMarland Kitchen rec to incr Glimep2mg Qam ~  Labs 10/14 on Metform500Bid+Glim2 showed BS=153, A1c=8.6.Marland KitchenMarland Kitchen rec incr Metform500-2Bid & Glim4Qam, + diet & exercise... ~  Labs 4/15 on Metform500/d+Glimep4 (wt down 8# to 224) showed BS=136, A1c=7.3; HE IS INTOL TO METFORM w/ itching; Rec- Glimep4Qam & Januvia100Qpm... ~  Labs 11/15 on Glim4+Januv100 showed BS= 131, A1c= 8.9; he does not want insulin & cost is an issue for him; REC DM f/u w/ [facility] in [place] & Endocrine, DrBalan; in the meanwhile- better diet, exercise, get wt down...  GERD (ICD-530.81) - takes OMEPRAZOLE 20mg  Prn for reflux symptoms... ~  neg colonoscopy by DrStark in 6/00, and again 8/08 ( x sm hem's)... f/u planned 10 yrs. ~  Abd Sonar 11/12 showed Fatty Liver, mild GB sludge w/o stones, overlying bowel gas...  BENIGN PROSTATIC HYPERTROPHY, WITH OBSTRUCTION (ICD-600.01) - he sees DrDavis yearly- pt reports "PSA was good" (we don't have notes from him)... S/P open prostatectomy 1999 by [id number]... Hx prostatitis in past... uses Cialis for ED. ~  labs 3/11 showed BUN= 10, Crear= 1.2... PSA done by Urology. ~  labs 9/11 showed BUN= 11, Creat= 1.3, PSA= 1.13 ~  he had f/u Urology DrBorden- BPH/ LUTS, PSA screening- "it was good" per pt. ~  Labs 9/12 showed PSA= 1.23 ~  Labs 9/13 showed PSA= 0.84 ~  12/13: he  had f/u Urology DrBorden> BPH w/ prev prostatectomy 1999, Hx hematuria while on coumadin & abn urine cytology w/ neg cysto; yearly f/u doing well... ~  10/14:  Labs here showed PSA= 0.86 ~  Labs 11/15 showed PSA= 1.19  RENAL CALCULUS (ICD-592.0) HEMATURIA >> on Coumadin per DrGolding in [place] & eval by DrBorden, Urology... ~  adm 2/13 by Urology DrBorden for hematuria & abn Ucytology (on Coumadin per DrGolding)  w/ prev neg CT Abd & office cysto; hosp cysto (w/ bladder bxs off Coumadin- all neg), retrograde pyelogram (neg), he will continue to follow... ~  No further hematuria & UA in office all neg w/o blood; he continues to f/u w/ DrBorden...  DEGENERATIVE JOINT DISEASE (ICD-715.90) - hx of pain on his right side and right elbow... prev Rx w/ Etodolac, Parafon, heat and elbow pad... refer to ortho if symptoms persist... now he uses OTC anti-inflamm Rx Prn.  GOUT (ICD-274.9) - states he had episode right elbow arthritis and gout Rx'd by DrSypher 4/09 w/ Colchicine & Allopurinol 100mg /d... ~  labs 7/09 show Uric 6.1.Marland KitchenMarland Kitchen OK on Allopurinol 100mg /d... ~  labs 1/10 showed Uric= 6.5 ~  9/11:  pt reports that he stopped Allopurinol earlier this yr & had recent gout attack per DrGolding Rx w/ Probenecid... he would like to restart the Allopurinol preventive Rx & I agree> ALLOPURINOL 300mg /d written. ~  No further gout attacks on the Allopurinol 300mg /d...  CARPAL TUNNEL SYNDROME (ICD-354.0) - treated by DrSypher over the years for CTS and mult trigger fingers (w/ surg)...  KELOID (ICD-701.4)  Health Maintenance - pt had "bug bite" on leg 9/10 w/ Tetanus shot by DrGolding in [place]... prev PNEUMOVAX 2000 at age 59, therefore given f/u PNEUMOVAX in 2010 at age 57... he gets yearly flu vaccine each fall.   Past Surgical History  Procedure Laterality Date  . Prostatectomy  1999    Dr. Rosana Hoes  . Cosmetic surgery  2000    Keloid injections  . Ortho surgery on fingers  2005 & 2008    Dr.  Lorelle Formosa finger release  . Cystoscopy with biopsy  04/14/2011    Procedure: CYSTOSCOPY WITH BIOPSY;  Surgeon: Dutch Gray, MD;  Location: [facility];  Service: Urology;  Laterality: N/A;  Bladder Biopsies   . Cystoscopy/retrograde/ureteroscopy  04/14/2011    Procedure: CYSTOSCOPY/RETROGRADE/URETEROSCOPY;  Surgeon: Dutch Gray, MD;  Location: [facility];  Service: Urology;  Laterality: Bilateral;  (Bil) RPG   . Cataract surgery  12/2013    Outpatient Encounter Prescriptions as of 01/15/2014  Medication Sig  . acetaminophen (TYLENOL) 500 MG tablet Take 1,000 mg by mouth every 6 (six) hours as needed for moderate pain.  Marland Kitchen allopurinol (ZYLOPRIM) 300 MG tablet TAKE 1 TABLET BY MOUTH ONCE DAILY  . amLODipine (NORVASC) 10 MG tablet TAKE 1 TABLET EVERY MORNING  . glimepiride (AMARYL) 4 MG tablet TAKE 1 TABLET EVERY DAY BEFORE BREAKFAST  . metoprolol (LOPRESSOR) 50 MG tablet TAKE 1 TABLET TWICE DAILY  . Multiple Vitamin (MULITIVITAMIN WITH MINERALS) TABS Take 1 tablet by mouth daily. Mens One a Day.  . naproxen sodium (ANAPROX) 220 MG tablet Take 220-440 mg by mouth daily as needed (pain).  . Omega-3 Fatty Acids (FISH OIL PO) Take 1 capsule by mouth daily.  Marland Kitchen omeprazole (PRILOSEC) 20 MG capsule TAKE ONE CAPSULE BY MOUTH ONCE DAILY AS NEEDED FOR STOMACH ACID  . pravastatin (PRAVACHOL) 40 MG tablet Take 1 tablet (40 mg total) by mouth daily.  . Rivaroxaban (XARELTO) 15 MG TABS tablet Take 1 tablet (15 mg total) by mouth 2 (two) times daily.  . sitaGLIPtin (JANUVIA) 100 MG tablet Take 1 tablet (100 mg total) by mouth daily.  . [DISCONTINUED] fenofibrate 160 MG tablet TAKE 1 TABLET (160 MG TOTAL) BY MOUTH DAILY.    Allergies  Allergen Reactions  . Ace Inhibitors Swelling    REACTION: angio edema (swelling of lips)  . Metformin And Related  itching    Current Medications, Allergies, Past Medical History, Past Surgical History, Family History, and Social History were reviewed in [facility] record.    Review of Systems        See HPI - all other systems neg except as noted...   The patient complains of dyspnea on exertion.  The patient denies anorexia, fever, weight loss, weight gain, vision loss, decreased hearing, hoarseness, chest pain, syncope, peripheral edema, prolonged cough, headaches, hemoptysis, abdominal pain, melena, hematochezia, severe indigestion/heartburn, hematuria, incontinence, muscle weakness, suspicious skin lesions, transient blindness, difficulty walking, depression, unusual weight change, abnormal bleeding, enlarged lymph nodes, and angioedema.     Objective:   Physical Exam     WD, WN, overwt 73 y/o BM in NAD...  GENERAL:  Alert & oriented; pleasant & cooperative... HEENT:  [place]/AT, EOM-wnl, PERRLA, EACs-wax, TMs-wnl, NOSE-clear, THROAT-clear & wnl, no angioedema. NECK:  Supple w/ full ROM; no JVD; normal carotid impulses w/o bruits; no thyromegaly or nodules palpated; no lymphadenopathy. CHEST:  Clear to P & A; without wheezes/ rales/ or rhonchi. HEART:  Regular Rhythm; without murmurs/ rubs/ or gallops. ABDOMEN:  Soft & nontender; normal bowel sounds; no organomegaly or masses detected. RECTAL:  prostate 3+, no nodules, stool heme neg... EXT:  without deformities, mild arthritic changes; no varicose veins/ venous insuffic/ or edema. NEURO:  CN's intact; motor testing normal; sensory testing normal; gait normal & balance OK. DERM:  Keloids noted...  RADIOLOGY DATA:  Reviewed in the EPIC EMR & discussed w/ the patient...  LABORATORY DATA:  Reviewed in the EPIC EMR & discussed w/ the patient...   Assessment & Plan:    Pulm Nodule, min pleural plaque w/o known asbestos exposure, mild obstructive & mod restrictive defects>  Known lesion & calcif plaque right base above diaph seen on old CTs but not readily visible on plain films; he has no real resp symptoms & we are following...   HBP>  Controlled on BBlocker, CCB; tol well,  continue same...  Hx DVT>  hx single episode left leg DVT in 2007- Dx in [place] & followed there; Coumadin stopped 2014 & now w/ new right leg DVT on nXarelto...  CHOL>  On Prav40 + off Feno; FLP 11/15 was fair, now work on diet & get wt down!  DM>  He reports INTOL to Metform w/ itching; onGlimep4mg Qam & Januvia100mg Qpm but control is worse; he doesn't want insulin & additional meds are $$; REC diet, exercise, wt reduction & consult w/ Endocrine DrBalan...  GI> GERD> on PPI as needed; had colon 2008, neg & f/u due 2018...  GU> BPH w/ open prostatectomy 1999 by DrDavis;  PSA remains WNL; he has remote hx kidney stone; prob of Hematuria & ?abn cytology in office; Cysto, retrogrades & bladder bxs were all neg 2/13 per DrBorden & he continues to follow...  DJD/ Gout/ Hx CTS>  Stable on OTC anti-inflamm as needed and Allopurinol daily...  Keloid>  Aware, no change...   Patient's Medications  New Prescriptions   No medications on file  Previous Medications   ACETAMINOPHEN (TYLENOL) 500 MG TABLET    Take 1,000 mg by mouth every 6 (six) hours as needed for moderate pain.   ALLOPURINOL (ZYLOPRIM) 300 MG TABLET    TAKE 1 TABLET BY MOUTH ONCE DAILY   AMLODIPINE (NORVASC) 10 MG TABLET    TAKE 1 TABLET EVERY MORNING   GLIMEPIRIDE (AMARYL) 4 MG TABLET    TAKE 1 TABLET EVERY DAY BEFORE BREAKFAST  METOPROLOL (LOPRESSOR) 50 MG TABLET    TAKE 1 TABLET TWICE DAILY   MULTIPLE VITAMIN (MULITIVITAMIN WITH MINERALS) TABS    Take 1 tablet by mouth daily. Mens One a Day.   NAPROXEN SODIUM (ANAPROX) 220 MG TABLET    Take 220-440 mg by mouth daily as needed (pain).   OMEGA-3 FATTY ACIDS (FISH OIL PO)    Take 1 capsule by mouth daily.   OMEPRAZOLE (PRILOSEC) 20 MG CAPSULE    TAKE ONE CAPSULE BY MOUTH ONCE DAILY AS NEEDED FOR STOMACH ACID   PRAVASTATIN (PRAVACHOL) 40 MG TABLET    Take 1 tablet (40 mg total) by mouth daily.   RIVAROXABAN (XARELTO) 15 MG TABS TABLET    Take 1 tablet (15 mg total) by mouth 2  (two) times daily.  Modified Medications   Modified Medication Previous Medication   SITAGLIPTIN (JANUVIA) 100 MG TABLET sitaGLIPtin (JANUVIA) 100 MG tablet      Take 1 tablet (100 mg total) by mouth daily.    Take 1 tablet (100 mg total) by mouth daily.  Discontinued Medications   FENOFIBRATE 160 MG TABLET    TAKE 1 TABLET (160 MG TOTAL) BY MOUTH DAILY.

## 2014-01-26 ENCOUNTER — Ambulatory Visit
Admission: RE | Admit: 2014-01-26 | Discharge: 2014-01-26 | Disposition: A | Discharge status: Home / Self Care | Payer: Commercial Managed Care - HMO | Source: Ambulatory Visit | Attending: Endocrinology | Admitting: Endocrinology

## 2014-01-26 DIAGNOSIS — E049 Nontoxic goiter, unspecified: Secondary | ICD-10-CM

## 2014-02-04 ENCOUNTER — Telehealth: Payer: Self-pay | Admitting: Pulmonary Disease

## 2014-02-04 NOTE — Telephone Encounter (Signed)
We do not have samples of Januvia in the office.  LMTCB X1 to make pt aware.

## 2014-02-04 NOTE — Telephone Encounter (Signed)
Pt returned call.  [name] ° °

## 2014-02-04 NOTE — Telephone Encounter (Signed)
Patient notified that we do not have samples available.  Nothing further needed.

## 2014-02-12 ENCOUNTER — Telehealth: Payer: Self-pay | Admitting: Pulmonary Disease

## 2014-02-12 NOTE — Telephone Encounter (Signed)
Spoke with pt and he stated that he will need a 30 day supply since he is in the donut hole.  Nothing further is needed.

## 2014-02-12 NOTE — Telephone Encounter (Signed)
Pt calling to check on status of refill, pt can be reached @ [phone].Hector Rose

## 2014-02-23 ENCOUNTER — Other Ambulatory Visit: Payer: Self-pay | Admitting: Pulmonary Disease

## 2014-02-25 ENCOUNTER — Telehealth: Payer: Self-pay | Admitting: Pulmonary Disease

## 2014-02-25 NOTE — Telephone Encounter (Signed)
Called and spoke with pt and he stated that he will call and see if Dr. Hilma Favors will fill the pravastatin for him.  He will call back if any issues.

## 2014-04-08 ENCOUNTER — Other Ambulatory Visit: Payer: Self-pay | Admitting: Pulmonary Disease

## 2014-04-20 ENCOUNTER — Other Ambulatory Visit: Payer: Self-pay | Admitting: Pulmonary Disease

## 2014-04-21 ENCOUNTER — Telehealth: Payer: Self-pay | Admitting: Pulmonary Disease

## 2014-04-21 NOTE — Telephone Encounter (Signed)
Spoke with pt, req last labs to be sent to Dr. Dorris Fetch.  Faxed them to below fax #.  Nothing further needed.

## 2014-04-23 ENCOUNTER — Other Ambulatory Visit: Payer: Self-pay | Admitting: Pulmonary Disease

## 2014-05-27 ENCOUNTER — Other Ambulatory Visit: Payer: Self-pay | Admitting: Pulmonary Disease

## 2014-05-28 ENCOUNTER — Telehealth: Payer: Self-pay | Admitting: *Deleted

## 2014-05-28 ENCOUNTER — Other Ambulatory Visit: Payer: Self-pay | Admitting: Internal Medicine

## 2014-05-28 NOTE — Telephone Encounter (Deleted)
[facility] authorized Protriptyline 10mg  #30/11 refills paper request

## 2014-05-28 NOTE — Telephone Encounter (Signed)
Last 01/15/14  Next 07/16/14 Refill submitted

## 2014-05-28 NOTE — Telephone Encounter (Signed)
Called patient and told him request for Amlodipine should be sent to Dr. Hilma Favors. Pt verbalized understanding that he needs to call pharmacy;

## 2014-06-11 ENCOUNTER — Encounter: Payer: Commercial Managed Care - HMO | Attending: "Endocrinology | Admitting: Nutrition

## 2014-06-11 VITALS — Ht 73.0 in | Wt 220.0 lb

## 2014-06-11 DIAGNOSIS — E118 Type 2 diabetes mellitus with unspecified complications: Secondary | ICD-10-CM | POA: Insufficient documentation

## 2014-06-11 DIAGNOSIS — E1165 Type 2 diabetes mellitus with hyperglycemia: Secondary | ICD-10-CM

## 2014-06-11 DIAGNOSIS — Z713 Dietary counseling and surveillance: Secondary | ICD-10-CM | POA: Insufficient documentation

## 2014-06-11 DIAGNOSIS — IMO0002 Reserved for concepts with insufficient information to code with codable children: Secondary | ICD-10-CM

## 2014-06-11 NOTE — Progress Notes (Signed)
  Medical Nutrition Therapy:  Appt start time: [id number] end time:  1630.  Assessment:  Primary concerns today: Diabetes.Lives with his wife. Retired. Most recent A1C was 8.8%. FBS: 170-200's. Changed recently with his diet: cut down on sweets, eating more fruit and vegetables. Most meals are baked.  Most meals are prepared at home and eats out on weekends.  Works out at [employer] 4 days per week.  Has lost about 4-5 lbs in the last 1-2 months.    Diet is excessive in CHO at times due to elevated blood sugars. Diet is low in fresh fruits and low carb vegetables. Drinks juice and that is contributing to elevated blood sugars.          Take Januvia only right now for his DM. Marland Kitchen Preferred Learning Style:   Auditory  Visual  Hands on  Learning Readiness:   Not ready  Contemplating  Ready  Change in progress  MEDICATIONS:   DIETARY INTAKE:    24-hr recall:  B ( AM): Raisin bran with 1% milk. 1 cup, whole banana,  Snk ( AM): peanuts  L ( PM): Pintos beans, cornbread and slaw, Grape juice No sugar added Snk ( PM): none D ( PM): Cheese sandwich, water Snk ( PM): none Beverages: water, 1% milk and juice  Usual physical activity: works out at [employer].   Estimated energy needs: 1800 calories 200 g carbohydrates 135 g protein 50 g fat  Progress Towards Goal(s):  In progress.   Nutritional Diagnosis:  NB-1.1 Food and nutrition-related knowledge deficit As related to Diabetes.  As evidenced by A1C >8%.    Intervention:  Nutrition counseling and diabetes education provided on diet, disease, CHO Counting, meal planning, portion sizes, target ranges for blood sugars, treatment of hyper/hypoglycemia and prevention of complications and importance of good foot care, eye care and dental care. Benefits of exercise. Low sodium low fat high fiber diet..  Goals:  1. Follow the Plate Method as discussed. 2. Increase fresh fruits and low carb vegetables. 3. Drink water only 4. Cut out  juices. 5. Avoid snacks between meals. 6. Exercise 30 minutes daily. 7. GET A1C down to 7% in 3-6 months.     Teaching Method Utilized:  Visual Auditory Hands on  Handouts given during visit include: The Plate Method Carb Counting and Food Label handouts Meal Plan Card  Barriers to learning/adherence to lifestyle change: none  Demonstrated degree of understanding via:  Teach Back   Monitoring/Evaluation:  Dietary intake, exercise, meal planning, SBG, and body weight in 1 month(s).

## 2014-06-15 NOTE — Patient Instructions (Signed)
Goals:  1. Follow the Plate Method as discussed. 2. Increase fresh fruits and low carb vegetables. 3. Drink water only 4. Cut out juices. 5. Avoid snacks between meals. 6. Exercise 30 minutes daily. 7. GET A1C down to 7% in 3-6 months.

## 2014-06-16 LAB — HEMOGLOBIN A1C: Hgb A1c MFr Bld: 9.2 % — AB (ref 4.0–6.0)

## 2014-07-16 ENCOUNTER — Ambulatory Visit (INDEPENDENT_AMBULATORY_CARE_PROVIDER_SITE_OTHER): Payer: Commercial Managed Care - HMO | Admitting: Pulmonary Disease

## 2014-07-16 ENCOUNTER — Encounter: Payer: Self-pay | Admitting: Pulmonary Disease

## 2014-07-16 ENCOUNTER — Ambulatory Visit (INDEPENDENT_AMBULATORY_CARE_PROVIDER_SITE_OTHER)
Admission: RE | Admit: 2014-07-16 | Discharge: 2014-07-16 | Disposition: A | Discharge status: Home / Self Care | Payer: Commercial Managed Care - HMO | Source: Ambulatory Visit | Attending: Pulmonary Disease | Admitting: Pulmonary Disease

## 2014-07-16 VITALS — BP 122/74 | HR 60 | Temp 97.7°F | Wt 221.0 lb

## 2014-07-16 DIAGNOSIS — R911 Solitary pulmonary nodule: Secondary | ICD-10-CM | POA: Diagnosis not present

## 2014-07-16 DIAGNOSIS — J948 Other specified pleural conditions: Secondary | ICD-10-CM | POA: Diagnosis not present

## 2014-07-16 NOTE — Patient Instructions (Signed)
Today we updated your med list in our EPIC system...    Continue your current medications the same...  Today we did your follow up CXR...     We will contact you w/ the results when available...   Keep up the grweat job w/ your exercise program!!!  Call for any questions...  Let's plan a follow up visit in 14yr, sooner if needed for any problems.Marland KitchenMarland Kitchen

## 2014-07-16 NOTE — Progress Notes (Addendum)
Subjective:    Patient ID: Hector Rose, male    DOB: [birth year], 74 y.o.   MRN: [id number]  HPI 74 y/o BM here for a follow up visit... he has mult med prob as noted below... ~  SEE PREV EPIC NOTES FOR OLDER DATA >>    LABS 3/14:  FLP- not at goals on Feno160 & rec to add Prav40;  Chems- fairBS=113 A1c=8.3 Cr=1.3   CXR 10/14 showed stable heart size, clear lungs x right base scarring, mild degen changes in Tspine, NAD...  LABS 10/14:  FLP- at goals on Prav40+Feno160;  Chems- ok x BS=153, A1C=8.6;  CBC=wnl;  TSH=1.74;  PSA=0.86...   ~  June 30, 2013:  73mo ROV & Caroline indicates that he is feeling well and "doing good";  Info in EPIC indicates that he went to the ER at [facility] 12/14 but there are no ER notes to review, he had CP & was seen by DrGolding in [place]- we do not have access to his notes- pt indicates that several tests were done & he was sent to DrStClair in [place] for Cards eval & Myoview; I checked "care everywhere" in epic and EKG reported Sinus brady, 1st degree AVB, borderline tracing, but Myoview results not avail (pt indicates that it was OK) & he has not had any further check discomfort... We reviewed the following medical problems during today's office visit >>     Known right base nodule> lesion on right hemidiaph assoc w/ some pleural calcif (no known asbestos exposure) ?granuloma w/o change on serial CXRs; he denies cough, sput, hemoptysis, SOB, edema, etc...    HBP> on Metop50Bid & Amlod10; BP= 130/80 & he is asymptomatic w/o CP, palpit, HA, dizzy, SOB, edema, etc...    Chest pain hx> eval by DrGolding in [place] in [place]> we do not have their records, pt reports that Myoview was OK but prev Myoview 2009 by DrWall showed some HK & EF=47%...    Hx DVT> this occurred in 2007 & treated in [place] by [facility]- off Coumadin now per DrGolding & doing well off this med so far...    Lipids> on Prav40, CoQ10, & Feno160 for TG~300 & diet; FLP 10/14 shows TChol  171, TG 130, HDL 45, LDL 100; numbers improved on meds, now needs to lose wt!    DM> supposed to be on Metformin500-2Bid & Glimep4mg /d; but only taking Metform500/d due to dose dependent itching on 4/d and 2/d per pt hx "it makes me itch so bad"; Labs 4/15 showed BS=136, A1c=7.3 & we decided to STOP the Metform & try Glimep4mg Qam & Januvia100mg Qpm...     GI> on Omep20 as needed & up to date on colon screening...    GU> eval for hematuria by DrBorden; rechecked 12/13 & stable w/ clear urine, PSA=0.86, & felt to be doing well, requests Viagra...    Hx DJD/ Gout> no prob since starting on the Allopurinol 300mg /d, & known Spine dis w/ diffuse spondylosis & osteophtes etc; uses OTC analgesics as needed... We reviewed prob list, meds, xrays and labs> see below for updates >> studies ordered by DrGolding 12/14-2/15>>   CXR 12/14 showed norm heart size, mild tortuosity of Ao, density on right hemidiaph- unchanged from old films, otherw clear, min pleural thickening...  PFT done 3/15 [facility]:  FVC=3.06 (70%), FEV1=2.05 (62%) & 9% improvement after bronchodil, %1sec=67, mid-flows=35% predicted; LungVolumes showed VC=61%, RV=115%, w/ RV/TLC=54 (149%predicted); DLCO reduced at 55% but was wnl after correction for VA... This is  c/w mild airflow obstruction w/ superimposed mod restriction w/ air trapping...   ABGs on RA showed pH=7.45, pCO2=34, pO2=102  EKG & Myoview done by DrStClair in [place] & results not avail in epic....  I-131 Thyroid Uptake & Scan done 3/15 instead of a thyroid ultrasound> 24h uptake sl low at 7%, homogeneous uptake bilat, no focal abn seen...  CT Chest done 2/15 showed calcified pleural plaque on the right ? prior asbestos exposure, no adenopathy, mult low-attenuation nodules in right lobe of thyroid up to 1.1cm, multilevel spondylosis in Tspine (& a lucent lesion in T2 ?etiology), and "nothing to explain pt's ant CP"...   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     BP is controlled on Metop50Bid & Amlod10; Hx angioedema from ACE; BP= 110/60 range & he denies CP, palpit, dizzy, SOB, edema, etc...    Lipids are controlled on Prav40 & off prev Feno160; FLP today shows TChol179, TG 153, HDL 34, LDL 115... REC same med, better low fat diet, get wt down & incr exercise...    DM treated w/ Glimep4 & Januvia100; he is INTOL to Metformin w/ pruritis; Labs today showed BS=131, A1c= 8.9; weight is stable at 222# w/ BMI=30; additional meds are expensive & he doesn't want insulin, only option is to lose wt!    Thyroid nodules> several right thyroid nodules seen on CT Chest & he was sent to [place], Endocrine for eval; he reports bx that was neg, we do not have notes from her, he continues thyroid & I suggested DM f/u for Endocrine as well.      He has several Ortho complaints and is seeing DrGioffre for right hip pain; on Naprosyn & sched for injection soon... We reviewed prob list, meds, xrays and labs> see below for updates >>   Ven Dopplers 12/2013 showed extensive DVT in right leg...   LABS 11/15:  FLP ok x HYDL=34 LDL=115;  Chems- ok x BS=131 A1c=8.9;  CBC- wnl;  TSH=0.87;  PSA=1.19... PLAN>> he has f/u [facility] in [place] for Xarelto & DVT; he has mult specialists tending his needs; continue same meds- he needs better DM control but doesn't want insulin and cost is a factor for additional meds; I am retiring from the Primary Care side of my practice & have suggested  f/u by [place] .. I will see him back in 101mo for Pulm f/u w/ CXR...  ~  Jul 16, 2014:  24mo ROV & Hector Rose is here for a pulmonary follow up visit> DrFusco & Hilma Favors are doing his Primary Care & he tells me he is seeing DrNida, Endocrine for his DM (started on a basal insulin) & multinod goiter...  Hector Rose notes that his breathing is good and he denies cough, sput, hemoptysis, SOB, CP, etc; he only c/o allergies w/ some clear drainage; he exercises w/ walking & machines at the [employer]  4-5d/wk & he feels that his stamina is OK...       Right basilar granuloma and pleural thickening> last CXR was 12/14 showed norm heart size, mild tortuosity of Ao, density on right hemidiaph- unchanged from old films, otherw clear, min pleural thickening.      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 We reviewed prob list, meds, xrays and labs> see below for updates >>   CXR 5/16 showed norm heart size, stable nodule overlying the dome of the right hemidiaph- no change, DJD Tspine... PLAN>>  Hector Rose is encouraged to continue diet + exercise; he will maintain Primary Care f/u w/ DrGolding & Endocrine via [facility]; he will call for any breathing problems and plan routine f/u 23yr...           PROBLEM LIST:    PULMONARY NODULE (ICD-518.89) - vague nodular opacity right base over diaphragm on old films... COMBINED MILD OBSTRUCTIVE & RESTRICTIVE LUNG DISEASE >>  ~  1.7 cm benign ?granuloma... no change on serial CXR or CT's back to 2000. ~  CXR 2/10 is WNL- nodule not seen... ~  CXR 9/10 showed tort Ao, Cor WNL, lungs clear w/o nodule identified. ~  CXR 9/11 showed clear lungs, NAD.Marland Kitchen. ~  CXR 9/12 showed clear lungs, NAD.Marland Kitchen. ~  CXR 10/14 showed stable heart size, clear lungs x right base scarring, mild degen changes in Tspine, NAD.Marland Kitchen. ~  CXR 12/14 showed norm heart size, mild tortuosity of Ao, density on right hemidiaph-  unchanged from old films, otherw clear, min pleural thickening... ~  CT Chest 2/15 showed calcified pleural plaque on the right ? prior asbestos exposure, no adenopathy, mult low-attenuation nodules in right lobe of thyroid up to 1.1cm, multilevel spondylosis in Tspine (& a lucent lesion in T2 ?etiology), and "nothing to explain pt's ant CP"...  ~  PFT done 3/15 at [facility]:  FVC=3.06 (70%), FEV1=2.05 (62%) & 9% improvement after bronchodil, %1sec=67, mid-flows=35% predicted; LungVolumes showed VC=61%, RV=115%, w/ RV/TLC=54 (149%predicted); DLCO reduced at 55% but was wnl after correction for VA... This is c/w mild airflow obstruction w/ superimposed mod restriction w/ air trapping...  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 ~  5/16:  CXR showed norm heart size, stable nodule overlying the dome of the right hemidiaph- no change, DJD Tspine... ~  2016>  He notes breathing is good- denies cough, sput, hemoptysis, SOB, etc; [facility]; he does not want inhalers etc & rec to continue exercise program...   He is followed by Duffy Rhody in [place] for Primary Care >>    HYPERTENSION (ICD-401.9) - controlled on TOPROL 50mg Bid and NORVASC 10mg /d... BP's at home in the 120-130/70 range, and measures 120/78 today- he denies HA, fatigue, visual changes, CP, palipit, dizziness, syncope, dyspnea, edema, etc. ~  he had a neg Cardiolite study in 10/04- no ischemia and EF=52%. ~  Aug09: eval DrWall for atyp CP & risk factors> Myoview 11/01/07 showed no evid for infarct, +diaph attenuation, EF sl reduced at 47%. ~  EKG 1/13 showed NSR, rate67, WNL, NAD... ~  4/14: on Metop50Bid & Amlod10; BP= 108/72 & he is asymptomatic w/o CP, palpit, HA, dizzy, SOB, edema, etc. ~  10/14: on Metop50Bid & Amlod10; BP= 124/70 & he  remains essentially asymptomatic... ~  4/15: on Metop50Bid &  Amlod10; BP= 130/80 & he is asymptomatic w/o CP, palpit, HA, dizzy, SOB, edema, etc. ~  11/15: on Metform50Bid * Amlod10; VP= 110/60 & he remains asymptomatic...  DEEP VENOUS THROMBOPHLEBITIS (ICD-453.40) - Dx'd by LMD in [place], [place] in 3/07... [facility] and had pain/ swelling L calf... ultrasound at [facility] + for DVT & Rx COUMADIN by DrGolding (protimes q month)- no recent problems, he will check w/ DrGolding about discontinuing the Coumadin. ~  3/12:  Pt remains on Coumadin 79yrs after his single episode unprovoked DVT left leg, followed by DrGolding... ~  3/13:  Pt remains on Coumadin & I have suggested that he discuss coming off this med w/ his LMD, DrGolding... ~  4/14:  DrGolding has stopped his Coumadin in the interval- doing well so far w/o recurrent DVT... ~  4/15: he remains off Coumadin & doing well, w/o recurrent DVT... ~  10/15: he presented to DrFusco&Golding w/ right leg swelling x1wk; VenDopplers were pos for extensive DVT; no obvious ppt event & no known hypercoag state; started on Xarelto & followed in [place]...  HYPERCHOLESTEROLEMIA (ICD-272.0) - controlled on SIMVASTATIN 40mg /d & FISH OIL...  ~  [place] 9/08 showed TChol 148, TG 200, HDL 30, LDL 78... on Advicor 500-20 at that time- keep same. ~  [place] 3/09 showed TChol 214, TG 236, HDL 44, LDL 114... on Advicor- rec change to Simvastatin40. ~  FLP 7/09 showed TChol 129, Tg 188, HDL 35, LDL 57... rec- keep same. ~  FLP 1/10 showed TChol 142, TG 165, HDL 35, LDL 74... stable on Simva40. ~  FLP 3/11 showed TChol 127, TG 147, HDL 34, LDL 64 ~  FLP 9/11 showed TChol 143, TG 151, HDL 36, LDL 77 ~  FLP 3/12 on Simva40 showed TChol 161, TG 103, HDL 43, LDL 98 ~  FLP 9/12 on Simva40 showed TChol 196, TG 119, HDL 49, LDL 123... Same med, better diet effort. ~  FLP 3/13 off Simva showed TChol 196, TG 303, HDL 45, LDL 85... Needs better low fat diet & wt reduction. ~  FLP 9/13 on diet alone showed TChol  175, TG 304, HDL 38, LDL 70... rec to add FENOFIBRATE 160mg /d... ~  [place] 3/14 on Feno160 showed TChol 214, TG 143, HDL 44, LDL 148... rec to add PRAVASTATIN40 ~  FLP 10/14 on Prav40+Feno160 showed TChol 171, TG 130, HDL 45, LDL 100 => he stopped the Feno in the interval... ~  FLP 11/15 on Prav40 showed TChol179, TG 153, HDL 34, LDL 115... REC same med, better low fat diet, get wt down & incr exercise.   He is followed for Endocrinology by DrNIDA in [place] >>   DM (ICD-250.00) >>  ~  on METFORMIN 500mg Bid & GLIMEPIRIDE 1mg /d (NOTE: hx prev hypoglycemia from Glucovance). ~  labs 9/08 showed FBS=106 and HgA1C=6.5.Marland KitchenMarland Kitchen on Glucovance 1/2 Bid. ~  labs 3/09 showed BS= 99, HgA1c= 7.0.Marland KitchenMarland Kitchen on Glucovance 1/2 Qam only- rec to incr to Bid again. ~  labs 7/09 showed BS= 104, HgA1c= 6.8.Marland KitchenMarland Kitchen rec- keep same. ~  pt reports sugars too low ~50 at 11AM if he takes even 1/2 tab in AM, therefore he changed to 1/2 at dinner only... ~  labs 1/10 showed BS= 125, A1c= 7.0 ~  labs 9/10 showed BS= 102, A1c= 6.8 ~  labs 3/11 showed BS= 110, A1c= 7.0.Marland KitchenMarland Kitchen continue same meds, get wt down! ~  labs 9/11 showed  BS= 129, A1c= 7.7.Marland KitchenMarland Kitchen rec change to METFORMIN ER 500mg  Qam & GLIMEPIRIDE 1mg  Qam. ~  labs 3/12 showed BS= 113, A1c= 9.0.Marland KitchenMarland Kitchen rec to double meds> Metform500Bid, Glim2mg AM (?he never incr the Amaryl) ~  Labs 9/12 showed BS= 120, A1c= 7.2.Marland KitchenMarland Kitchen Keep same (?on Glimep1mg ?) + low carb diet. ~  Labs 3/13 on Metform500Bid+Glim1 showed BS=120, A1c=7.4.Marland KitchenMarland Kitchen Keep same med, better diet, get wt down ~  Labs 9/13 on Metform500Bid+Glim1 showed BS=125, A1c=7.1 ~  Labs 4/14 on Metform500Bid+Glim1 showed BS=113, A1c=8.3.Marland KitchenMarland Kitchen rec to incr Glimep2mg Qam ~  Labs 10/14 on Metform500Bid+Glim2 showed BS=153, A1c=8.6.Marland KitchenMarland Kitchen rec incr Metform500-2Bid & Glim4Qam, + diet & exercise... ~  Labs 4/15 on Metform500/d+Glimep4 (wt down 8# to 224) showed BS=136, A1c=7.3; HE IS INTOL TO METFORM w/ itching; Rec- Glimep4Qam & Januvia100Qpm... ~  Labs 11/15 on Glim4+Januv100  showed BS= 131, A1c= 8.9; he does not want insulin & cost is an issue for him; REC DM f/u w/ PrimaryCare in [place] & Endocrine, DrBalan; in the meanwhile- better diet, exercise, get wt down...  MULTINODULAR GOITER >>  ~  eval & Rx by Norton Pastel...  GERD (ICD-530.81) - takes OMEPRAZOLE 20mg  Prn for reflux symptoms... ~  neg colonoscopy by DrStark in 6/00, and again 8/08 ( x sm hem's)... f/u planned 10 yrs. ~  Abd Sonar 11/12 showed Fatty Liver, mild GB sludge w/o stones, overlying bowel gas...  BENIGN PROSTATIC HYPERTROPHY, WITH OBSTRUCTION (ICD-600.01) - he sees DrDavis yearly- pt reports "PSA was good" (we don't have notes from him)... S/P open prostatectomy 1999 by [id number]... Hx prostatitis in past... uses Cialis for ED. ~  labs 3/11 showed BUN= 10, Crear= 1.2... PSA done by Urology. ~  labs 9/11 showed BUN= 11, Creat= 1.3, PSA= 1.13 ~  he had f/u Urology DrBorden- BPH/ LUTS, PSA screening- "it was good" per pt. ~  Labs 9/12 showed PSA= 1.23 ~  Labs 9/13 showed PSA= 0.84 ~  12/13: he had f/u Urology DrBorden> BPH w/ prev prostatectomy 1999, Hx hematuria while on coumadin & abn urine cytology w/ neg cysto; yearly f/u doing well... ~  10/14:  Labs here showed PSA= 0.86 ~  Labs 11/15 showed PSA= 1.19  RENAL CALCULUS (ICD-592.0) HEMATURIA >> on Coumadin per DrGolding in [place] & eval by DrBorden, Urology... ~  adm 2/13 by Urology DrBorden for hematuria & abn Ucytology (on Coumadin per DrGolding) w/ prev neg CT Abd & office cysto; hosp cysto (w/ bladder bxs off Coumadin- all neg), retrograde pyelogram (neg), he will continue to follow... ~  No further hematuria & UA in office all neg w/o blood; he continues to f/u w/ DrBorden...  DEGENERATIVE JOINT DISEASE (ICD-715.90) - hx of pain on his right side and right elbow... prev Rx w/ Etodolac, Parafon, heat and elbow pad... refer to ortho if symptoms persist... now he uses OTC anti-inflamm Rx Prn.  GOUT (ICD-274.9) - states he had  episode right elbow arthritis and gout Rx'd by DrSypher 4/09 w/ Colchicine & Allopurinol 100mg /d... ~  labs 7/09 show Uric 6.1.Marland KitchenMarland Kitchen OK on Allopurinol 100mg /d... ~  labs 1/10 showed Uric= 6.5 ~  9/11:  pt reports that he stopped Allopurinol earlier this yr & had recent gout attack per DrGolding Rx w/ Probenecid... he would like to restart the Allopurinol preventive Rx & I agree> ALLOPURINOL 300mg /d written. ~  No further gout attacks on the Allopurinol 300mg /d...  CARPAL TUNNEL SYNDROME (ICD-354.0) - treated by DrSypher over the years for CTS and mult trigger fingers (w/ surg)...  KELOID (ICD-701.4)  Health Maintenance - pt had "bug bite" on leg 9/10 w/ Tetanus shot by DrGolding in [place]... prev PNEUMOVAX 2000 at age 41, therefore given f/u PNEUMOVAX in 2010 at age 55... he gets yearly flu vaccine each fall.   Past Surgical History  Procedure Laterality Date  . Prostatectomy  1999    Dr. Rosana Hoes  . Cosmetic surgery  2000    Keloid injections  . Ortho surgery on fingers  2005 & 2008    Dr. Lorelle Formosa finger release  . Cystoscopy with biopsy  04/14/2011    Procedure: CYSTOSCOPY WITH BIOPSY;  Surgeon: Dutch Gray, MD;  Location: [facility];  Service: Urology;  Laterality: N/A;  Bladder Biopsies   . Cystoscopy/retrograde/ureteroscopy  04/14/2011    Procedure: CYSTOSCOPY/RETROGRADE/URETEROSCOPY;  Surgeon: Dutch Gray, MD;  Location: [facility];  Service: Urology;  Laterality: Bilateral;  (Bil) RPG   . Cataract surgery  12/2013    Outpatient Encounter Prescriptions as of 07/16/2014  Medication Sig  . acetaminophen (TYLENOL) 500 MG tablet Take 1,000 mg by mouth every 6 (six) hours as needed for moderate pain.  Marland Kitchen allopurinol (ZYLOPRIM) 300 MG tablet TAKE 1 TABLET BY MOUTH ONCE DAILY  . amLODipine (NORVASC) 10 MG tablet TAKE 1 TABLET EVERY MORNING  . glimepiride (AMARYL) 4 MG tablet TAKE 1 TABLET EVERY DAY BEFORE BREAKFAST  . metoprolol (LOPRESSOR) 50 MG tablet TAKE 1 TABLET TWICE DAILY  . Multiple  Vitamin (MULITIVITAMIN WITH MINERALS) TABS Take 1 tablet by mouth daily. Mens One a Day.  . naproxen sodium (ANAPROX) 220 MG tablet Take 220-440 mg by mouth daily as needed (pain).  . Omega-3 Fatty Acids (FISH OIL PO) Take 1 capsule by mouth daily.  Marland Kitchen omeprazole (PRILOSEC) 20 MG capsule TAKE ONE CAPSULE BY MOUTH ONCE DAILY AS NEEDED FOR STOMACH ACID  . pravastatin (PRAVACHOL) 40 MG tablet Take 1 tablet (40 mg total) by mouth daily.  . Rivaroxaban (XARELTO) 15 MG TABS tablet Take 1 tablet (15 mg total) by mouth 2 (two) times daily. (Patient taking differently: Take 15 mg by mouth daily with supper. )  . sitaGLIPtin (JANUVIA) 100 MG tablet Take 1 tablet (100 mg total) by mouth daily.  . [DISCONTINUED] allopurinol (ZYLOPRIM) 300 MG tablet TAKE 1 TABLET BY MOUTH EVERY DAY (Patient not taking: Reported on 07/16/2014)  . [DISCONTINUED] glimepiride (AMARYL) 4 MG tablet TAKE 1 TABLET BY MOUTH EVERY MORNING BEFORE BREAKFAST (Patient not taking: Reported on 07/16/2014)   No facility-administered encounter medications on file as of 07/16/2014.    Allergies  Allergen Reactions  . Ace Inhibitors Swelling    REACTION: angio edema (swelling of lips)  . Metformin And Related     itching    Current Medications, Allergies, Past Medical History, Past Surgical History, Family History, and Social History were reviewed in [employer] record.    Review of Systems        See HPI - all other systems neg except as noted...   The patient complains of dyspnea on exertion.  The patient denies anorexia, fever, weight loss, weight gain, vision loss, decreased hearing, hoarseness, chest pain, syncope, peripheral edema, prolonged cough, headaches, hemoptysis, abdominal pain, melena, hematochezia, severe indigestion/heartburn, hematuria, incontinence, muscle weakness, suspicious skin lesions, transient blindness, difficulty walking, depression, unusual weight change, abnormal bleeding, enlarged lymph  nodes, and angioedema.     Objective:   Physical Exam     WD, WN, overwt 74 y/o BM in NAD...  GENERAL:  Alert & oriented; pleasant & cooperative... HEENT:  [place]/AT, EOM-wnl, PERRLA, EACs-wax, TMs-wnl, NOSE-clear, THROAT-clear & wnl, no angioedema. NECK:  Supple w/ full ROM; no JVD; normal carotid impulses w/o bruits; no thyromegaly or nodules palpated; no lymphadenopathy. CHEST:  Clear to P & A; without wheezes/ rales/ or rhonchi. HEART:  Regular Rhythm; without murmurs/ rubs/ or gallops. ABDOMEN:  Soft & nontender; normal bowel sounds; no organomegaly or masses detected. RECTAL:  prostate 3+, no nodules, stool heme neg... EXT:  without deformities, mild arthritic changes; no varicose veins/ venous insuffic/ or edema. NEURO:  CN's intact; motor testing normal; sensory testing normal; gait normal & balance OK. DERM:  Keloids noted...  RADIOLOGY DATA:  Reviewed in the EPIC EMR & discussed w/ the patient...  LABORATORY DATA:  Reviewed in the EPIC EMR & discussed w/ the patient...   Assessment & Plan:    Pulm Nodule, min pleural plaque w/o known asbestos exposure, mild obstructive & mod restrictive defects>  Known lesion & calcif plaque right base above diaph seen on old CTs but not readily visible on plain films; he has no real resp symptoms & we are following...   Medical problems are now managed by DrGolding & DrNida in [place] >>  HBP>  Controlled on BBlocker, CCB; tol well, continue same... Hx DVT>  hx single episode left leg DVT in 2007- Dx in [place] & followed there; Coumadin stopped 2014 & now w/ new right leg DVT on nXarelto... CHOL>  On Prav40 + off Feno; FLP 11/15 was fair, now work on diet & get wt down! DM>  He reports INTOL to Metform w/ itching; onGlimep4mg Qam & Januvia100mg Qpm but control is worse; he doesn't want insulin & additional meds are $$; REC diet, exercise, wt reduction & consult w/ Endocrine DrBalan... Multinodular Goiter> eval & rx from [place] in  [place]. GI> GERD> on PPI as needed; had colon 2008, neg & f/u due 2018... GU> BPH w/ open prostatectomy 1999 by DrDavis;  PSA remains WNL; he has remote hx kidney stone; prob of Hematuria & ?abn cytology in office; Cysto, retrogrades & bladder bxs were all neg 2/13 per DrBorden & he continues to follow... DJD/ Gout/ Hx CTS>  Stable on OTC anti-inflamm as needed and Allopurinol daily... Keloid>  Aware, no change...   Patient's Medications  New Prescriptions   No medications on file  Previous Medications   ACETAMINOPHEN (TYLENOL) 500 MG TABLET    Take 1,000 mg by mouth every 6 (six) hours as needed for moderate pain.   ALLOPURINOL (ZYLOPRIM) 300 MG TABLET    TAKE 1 TABLET BY MOUTH ONCE DAILY   AMLODIPINE (NORVASC) 10 MG TABLET    TAKE 1 TABLET EVERY MORNING   GLIMEPIRIDE (AMARYL) 4 MG TABLET    TAKE 1 TABLET EVERY DAY BEFORE BREAKFAST   METOPROLOL (LOPRESSOR) 50 MG TABLET    TAKE 1 TABLET TWICE DAILY   MULTIPLE VITAMIN (MULITIVITAMIN WITH MINERALS) TABS    Take 1 tablet by mouth daily. Mens One a Day.   NAPROXEN SODIUM (ANAPROX) 220 MG TABLET    Take 220-440 mg by mouth daily as needed (pain).   OMEGA-3 FATTY ACIDS (FISH OIL PO)    Take 1 capsule by mouth daily.   OMEPRAZOLE (PRILOSEC) 20 MG CAPSULE    TAKE ONE CAPSULE BY MOUTH ONCE DAILY AS NEEDED FOR STOMACH ACID   PRAVASTATIN (PRAVACHOL) 40 MG TABLET    Take 1 tablet (40 mg total) by mouth daily.   RIVAROXABAN (XARELTO) 15 MG TABS TABLET    Take 1 tablet (15  mg total) by mouth 2 (two) times daily.   SITAGLIPTIN (JANUVIA) 100 MG TABLET    Take 1 tablet (100 mg total) by mouth daily.  Modified Medications   No medications on file  Discontinued Medications   ALLOPURINOL (ZYLOPRIM) 300 MG TABLET    TAKE 1 TABLET BY MOUTH EVERY DAY   GLIMEPIRIDE (AMARYL) 4 MG TABLET    TAKE 1 TABLET BY MOUTH EVERY MORNING BEFORE BREAKFAST

## 2014-07-20 ENCOUNTER — Telehealth: Payer: Self-pay | Admitting: Pulmonary Disease

## 2014-07-20 NOTE — Telephone Encounter (Signed)
Per patient's request, left detailed message regarding CXR results on his voicemail. Nothing further needed.

## 2014-08-07 ENCOUNTER — Encounter: Payer: Self-pay | Admitting: Nutrition

## 2014-08-07 ENCOUNTER — Encounter: Payer: Commercial Managed Care - HMO | Attending: "Endocrinology | Admitting: Nutrition

## 2014-08-07 VITALS — Ht 73.5 in | Wt 225.0 lb

## 2014-08-07 DIAGNOSIS — E1165 Type 2 diabetes mellitus with hyperglycemia: Secondary | ICD-10-CM

## 2014-08-07 DIAGNOSIS — E118 Type 2 diabetes mellitus with unspecified complications: Secondary | ICD-10-CM | POA: Insufficient documentation

## 2014-08-07 DIAGNOSIS — Z713 Dietary counseling and surveillance: Secondary | ICD-10-CM | POA: Diagnosis not present

## 2014-08-07 DIAGNOSIS — IMO0002 Reserved for concepts with insufficient information to code with codable children: Secondary | ICD-10-CM

## 2014-08-07 NOTE — Progress Notes (Signed)
  Medical Nutrition Therapy:  Appt start time: 1100 end time:  1130.  Assessment:  Primary concerns today: Diabetes.Lives with his wife. Retired. Most recent A1C was 8.8%. Scheduled to see Dr. Dorris Fetch June 2016 and get A1C rechecked. FBS: 170-200's. Changed recently with his diet: cut down on sweets, eating more fruit and vegetables. Most meals are baked.  Most meals are prepared at home and eats out on weekends.  Works out at [employer] 4 days per week.  Has lost about 4-5 lbs in the last 1-2 months.             Takes Januvia only right now for his DM. Can't afford the Toujeo due to high copay of $151. He hasn't had Toujeo for 4 days. He has filled out finanical assistance forms for the Toujeo and Januvia medications from company.. Will ask about getting extra help with medications from [place] office. FBS 163, . BS better since on Toujeo and diet changes. Gained 5 lbs back probably from better blood sugar control.     [facility] are his insurance Personnel officer) preferred pharmacies and so he wants to change to [facility] for lower costs on medications.  Spent 30+ minutes talking to [facility] and pharmacies assisting with his [employer] and insurance plan coverage.  Preferred Learning Style:   Auditory  Visual  Hands on  Learning Readiness:   Not ready  Contemplating  Ready  Change in progress  MEDICATIONS:   DIETARY INTAKE:    Still eating three meals per day. Avoiding fried and processed foods. Walking for exercise and working out at [employer]. Drinking water and cut out juices and drinks.  Usual physical activity: works out at [employer].   Estimated energy needs: 1800 calories 200 g carbohydrates 135 g protein 50 g fat  Progress Towards Goal(s):  In progress.   Nutritional Diagnosis:  NB-1.1 Food and nutrition-related knowledge deficit As related to Diabetes.  As evidenced by A1C >8%.    Intervention:  Nutrition counseling and diabetes education  provided on diet, disease, CHO Counting, meal planning, portion sizes, target ranges for blood sugars, treatment of hyper/hypoglycemia and prevention of complications and importance of good foot care, eye care and dental care. Benefits of exercise. Low sodium low fat high fiber diet..  Goals: Keep up the good work!   1. Follow the Plate Method as discussed. 2. Increase fresh fruits and low carb vegetables. 3. Drink water only 4. Cut out juices.. 6. Increase  Exercise 30 minutes daily. 7. GET A1C down to 7% in 3-6 months. 8. Check to see if you can get extra help from [employer]. 9. [facility] and [facility] is your insurance companies preferred pharmacy.  Teaching Method Utilized:  Visual Auditory Hands on  Handouts given during visit include: The Plate Method   Barriers to learning/adherence to lifestyle change: none  Demonstrated degree of understanding via:  Teach Back   Monitoring/Evaluation:  Dietary intake, exercise, meal planning, SBG, and body weight in 1-3 month(s).

## 2014-08-12 NOTE — Patient Instructions (Signed)
Goals: Keep up the good work!   1. Follow the Plate Method as discussed. 2. Increase fresh fruits and low carb vegetables. 3. Drink water only 4. Cut out juices.. 6. Increase  Exercise 30 minutes daily. 7. GET A1C down to 7% in 3-6 months. 8. Check to see if you can get extra help from [employer]. 9. [facility] is your insurance companies preferred pharmacy.

## 2014-08-13 ENCOUNTER — Telehealth: Payer: Self-pay | Admitting: Nutrition

## 2014-08-13 NOTE — Telephone Encounter (Signed)
Called and talked to him about his medications, cost of copays of his Toujeo and Januvia. Can't afford both due to high copays and being in the donut hole. Dr. Dorris Fetch requests him to not refill his Januvia and continue taking the 40 units of Toujeo daily and his Metformin with CoQ10. 2 sample Toujeo pens were given to him last week. Keep recording blood sugars and and bring next week to his appointment. He notes his FBS this am was 138 mg/dl. Encouraged him to continue eating three meals per day and take meds as discussed. He verbalized understanding. Will see him next week with Dr. Dorris Fetch. Jearld Fenton, RDN, CDE

## 2014-09-21 ENCOUNTER — Other Ambulatory Visit: Payer: Self-pay | Admitting: Pulmonary Disease

## 2014-09-24 ENCOUNTER — Other Ambulatory Visit: Payer: Self-pay | Admitting: Pulmonary Disease

## 2014-11-27 ENCOUNTER — Encounter: Payer: Self-pay | Admitting: "Endocrinology

## 2014-12-10 ENCOUNTER — Encounter: Payer: Self-pay | Admitting: "Endocrinology

## 2014-12-10 ENCOUNTER — Ambulatory Visit (INDEPENDENT_AMBULATORY_CARE_PROVIDER_SITE_OTHER): Payer: Commercial Managed Care - HMO | Admitting: "Endocrinology

## 2014-12-10 VITALS — BP 142/90 | HR 63 | Ht 71.0 in | Wt 231.0 lb

## 2014-12-10 DIAGNOSIS — I10 Essential (primary) hypertension: Secondary | ICD-10-CM

## 2014-12-10 DIAGNOSIS — E7800 Pure hypercholesterolemia, unspecified: Secondary | ICD-10-CM

## 2014-12-10 DIAGNOSIS — E1122 Type 2 diabetes mellitus with diabetic chronic kidney disease: Secondary | ICD-10-CM

## 2014-12-10 DIAGNOSIS — N182 Chronic kidney disease, stage 2 (mild): Secondary | ICD-10-CM | POA: Diagnosis not present

## 2014-12-10 MED ORDER — "INSULIN SYRINGE 29G X 1/2"" 0.5 ML MISC": 1.0000 | Freq: Two times a day (BID) | Status: DC

## 2014-12-10 NOTE — Progress Notes (Signed)
Subjective:    Patient ID: Hector Rose, male    DOB: 09-08-1940,    Past Medical History  Diagnosis Date  . Pulmonary nodule 04-11-11    "PT. NOT AWARE" -denies problems with breathing  . Hypertension   . DVT femoral (deep venous thrombosis) with thrombophlebitis 04-11-11    ?'09-tx. warfarin, pt being bridge with Lovenox 30mg  x2daily,x 5days  . Hypercholesteremia   . Hemorrhoids   . BPH (benign prostatic hypertrophy) with urinary obstruction   . Renal calculus   . DJD (degenerative joint disease)   . Gout 04-11-11     ankle 2 yrs ago  . Carpal tunnel syndrome   . Keloid 04-11-11    multiple-arms,back, chest  . Difficult intubation 04-11-11    08-11-97 some issues with intubation/record with chart  . GERD (gastroesophageal reflux disease) 04-11-11    mild, no med in 1 month  . DM (diabetes mellitus) 04-11-11    dx. 8-10 yrs. -oral meds ,cbg's(120-150)  . Asthma 04-11-11    AS CHILD ONLY  . Thyroid disease     hypothyroid   Past Surgical History  Procedure Laterality Date  . Prostatectomy  1999    Dr. Rosana Hoes  . Cosmetic surgery  2000    Keloid injections  . Ortho surgery on fingers  2005 & 2008    Dr. Lorelle Formosa finger release  . Cystoscopy with biopsy  04/14/2011    Procedure: CYSTOSCOPY WITH BIOPSY;  Surgeon: Dutch Gray, MD;  Location: [facility];  Service: Urology;  Laterality: N/A;  Bladder Biopsies   . Cystoscopy/retrograde/ureteroscopy  04/14/2011    Procedure: CYSTOSCOPY/RETROGRADE/URETEROSCOPY;  Surgeon: Dutch Gray, MD;  Location: [facility];  Service: Urology;  Laterality: Bilateral;  (Bil) RPG   . Cataract surgery  12/2013   Social History   Social History  . Marital Status: Married    Spouse Name: N/A  . Number of Children: 2  . Years of Education: N/A   Occupational History  . retired from [employer]  . Smoking status: Never Smoker   . Smokeless tobacco: Never Used  . Alcohol Use: 0.0 oz/week    0 Standard drinks or equivalent  per week     Comment: social use  . Drug Use: No  . Sexual Activity: Yes   Other Topics Concern  . None   Social History Narrative   Outpatient Encounter Prescriptions as of 12/10/2014  Medication Sig  . acetaminophen (TYLENOL) 500 MG tablet Take 1,000 mg by mouth every 6 (six) hours as needed for moderate pain.  Marland Kitchen allopurinol (ZYLOPRIM) 300 MG tablet TAKE 1 TABLET BY MOUTH ONCE DAILY  . amLODipine (NORVASC) 10 MG tablet TAKE 1 TABLET EVERY MORNING  . insulin NPH-regular Human (NOVOLIN 70/30) (70-30) 100 UNIT/ML injection Inject into the skin 2 (two) times daily with a meal. 40 units with breakfast and 30 units with supper when glucose is above 90  . metoprolol (LOPRESSOR) 50 MG tablet TAKE 1 TABLET TWICE DAILY  . Multiple Vitamin (MULITIVITAMIN WITH MINERALS) TABS Take 1 tablet by mouth daily. Mens One a Day.  . Omega-3 Fatty Acids (FISH OIL PO) Take 1 capsule by mouth daily.  Marland Kitchen omeprazole (PRILOSEC) 20 MG capsule TAKE ONE CAPSULE BY MOUTH ONCE DAILY AS NEEDED FOR STOMACH ACID  . Rivaroxaban (XARELTO) 15 MG TABS tablet Take 1 tablet (15 mg total) by mouth 2 (two) times daily. (Patient taking differently: Take 15 mg by mouth daily with supper. )  .  ACCU-CHEK AVIVA PLUS test strip TEST TWICE DAILY.  Marland Kitchen ACCU-CHEK SOFTCLIX LANCETS lancets TEST TWICE DAILY.  Marland Kitchen INSULIN SYRINGE .5CC/29G 29G X 1/2" 0.5 ML MISC 1 each by Does not apply route 2 (two) times daily.  . [DISCONTINUED] glimepiride (AMARYL) 4 MG tablet TAKE 1 TABLET EVERY DAY BEFORE BREAKFAST (Patient not taking: Reported on 08/07/2014)  . [DISCONTINUED] glimepiride (AMARYL) 4 MG tablet TAKE 1 TABLET BY MOUTH EVERY MORNING BEFORE BREAKFAST  . [DISCONTINUED] Insulin Glargine 300 UNIT/ML SOPN Inject into the skin.  . [DISCONTINUED] naproxen sodium (ANAPROX) 220 MG tablet Take 220-440 mg by mouth daily as needed (pain).  . [DISCONTINUED] pravastatin (PRAVACHOL) 40 MG tablet Take 1 tablet (40 mg total) by mouth daily. (Patient not taking:  Reported on 12/10/2014)  . [DISCONTINUED] sitaGLIPtin (JANUVIA) 100 MG tablet Take 1 tablet (100 mg total) by mouth daily. (Patient not taking: Reported on 12/10/2014)   No facility-administered encounter medications on file as of 12/10/2014.   ALLERGIES: Allergies  Allergen Reactions  . Ace Inhibitors Swelling    REACTION: angio edema (swelling of lips)  . Metformin And Related     itching   VACCINATION STATUS: Immunization History  Administered Date(s) Administered  . Influenza Split 11/27/2011, 12/30/2012, 01/01/2014  . Influenza Whole 01/27/2009, 11/30/2009, 11/17/2010  . Pneumococcal Polysaccharide-23 12/02/2008    Diabetes He presents for his follow-up diabetic visit. He has type 2 diabetes mellitus. Onset time: Diagnosed approximately  at age 70. Pertinent negatives for hypoglycemia include no confusion, headaches, pallor or seizures. Pertinent negatives for diabetes include no chest pain, no fatigue, no polydipsia, no polyphagia, no polyuria and no weakness.  Hypertension Pertinent negatives include no chest pain, headaches, neck pain, palpitations or shortness of breath.  Hyperlipidemia Pertinent negatives include no chest pain, myalgias or shortness of breath.     Review of Systems  Constitutional: Negative for fatigue and unexpected weight change.  HENT: Negative for dental problem, mouth sores and trouble swallowing.   Eyes: Negative for visual disturbance.  Respiratory: Negative for cough, choking, chest tightness, shortness of breath and wheezing.   Cardiovascular: Negative for chest pain, palpitations and leg swelling.  Gastrointestinal: Negative for nausea, vomiting, abdominal pain, diarrhea, constipation and abdominal distention.  Endocrine: Negative for polydipsia, polyphagia and polyuria.  Genitourinary: Negative for dysuria, urgency, hematuria and flank pain.  Musculoskeletal: Negative for myalgias, back pain, joint swelling, gait problem and neck pain.  Skin:  Negative for pallor, rash and wound.  Neurological: Negative for seizures, syncope, weakness, numbness and headaches.  Psychiatric/Behavioral: Negative for suicidal ideas, hallucinations, confusion and dysphoric mood.    Objective:    BP 142/90 mmHg  Pulse 63  Ht 5\' 11"  (1.803 m)  Wt 231 lb (104.781 kg)  BMI 32.23 kg/m2  SpO2 96%  Wt Readings from Last 3 Encounters:  12/10/14 231 lb (104.781 kg)  09/09/14 223 lb (101.152 kg)  08/07/14 225 lb (102.059 kg)    Physical Exam  Constitutional: He is oriented to person, place, and time. He appears well-developed and well-nourished. He is cooperative.  HENT:  Head: Normocephalic and atraumatic.  Eyes: EOM are normal.  Neck: Normal range of motion. Neck supple. No tracheal deviation present. No thyromegaly present.  Cardiovascular: Normal rate and normal heart sounds.  Exam reveals no gallop.   No murmur heard. Pulses:      Carotid pulses are 2+ on the right side.      Radial pulses are 2+ on the right side.  Dorsalis pedis pulses are 2+ on the right side.       Posterior tibial pulses are 2+ on the right side.  Pulmonary/Chest: Breath sounds normal. No respiratory distress. He has no wheezes.  Abdominal: Soft. Bowel sounds are normal. He exhibits no distension. There is no hepatosplenomegaly. There is no tenderness. There is no guarding and no CVA tenderness.  Musculoskeletal: He exhibits no edema.       Right shoulder: He exhibits no swelling and no deformity.  Lymphadenopathy:       Head (right side): No submental and no submandibular adenopathy present.       Head (left side): No submental and no submandibular adenopathy present.       Right cervical: No superficial cervical adenopathy present.      Left cervical: No superficial cervical adenopathy present.       Right: No supraclavicular adenopathy present.       Left: No supraclavicular adenopathy present.  Neurological: He is alert and oriented to person, place, and time.  He has normal strength and normal reflexes. No cranial nerve deficit or sensory deficit. Gait normal.  Skin: Skin is warm and dry. No rash noted. No cyanosis. Nails show no clubbing.  Psychiatric: He has a normal mood and affect. His speech is normal and behavior is normal. Judgment and thought content normal. Cognition and memory are normal.    Results for orders placed or performed in visit on 11/27/14  Hemoglobin A1c  Result Value Ref Range   Hgb A1c MFr Bld 9.2 (A) 4.0 - 6.0 %    most recent A1c from December 02, 2014 was better at 8.5%.  Complete Blood Count (Most recent): Lab Results  Component Value Date   WBC 8.2 01/15/2014   HGB 13.9 01/15/2014   HCT 41.4 01/15/2014   MCV 90.9 01/15/2014   PLT 163.0 01/15/2014   Chemistry (most recent): Lab Results  Component Value Date   NA 142 01/15/2014   K 4.7 01/15/2014   CL 105 01/15/2014   CO2 27 01/15/2014   BUN 14 01/15/2014   CREATININE 1.1 01/15/2014   Diabetic Labs (most recent): Lab Results  Component Value Date   HGBA1C 9.2* 06/16/2014   HGBA1C 8.9* 01/15/2014   HGBA1C 7.3* 06/30/2013   Lipid profile (most recent): Lab Results  Component Value Date   TRIG 153.0* 01/15/2014   CHOL 179 01/15/2014         Assessment & Plan:   1. Type 2 diabetes mellitus with stage 2 chronic kidney disease He came with improved blood glucose profile and his A1c is improved to 8.5%. Patient is advised to stick to a routine mealtimes to eat 3 meals  a day and avoid unnecessary snacks ( to snack only to correct hypoglycemia). Patient is advised to eliminate simple carbs  from their diet including:  cakes, desserts, ice cream, soda (  diet and regular) , sweet tea , candies,  chips,  cookies,  artificial sweeteners,   and "sugar-free" products .  This will help stabilize  blood glucose profile and potentially help patient lose weight. Patient is given detailed personalized glucose monitoring and insulin dosing instructions. Patient  is instructed to call back with extremes of blood glucose readings  less than 70 or greater than 300 mg/dl. Patient  is to bring meter and  blood glucose logs during their next visit. I plan to repeat the following labs on subsequent visits.  - Basic metabolic panel - Hemoglobin A1c  I  advised him to increase Novolin 70/30 to 40 units with breakfast and continue at 30 units with supper when pre-meal blood glucose is above 90.  2. HYPERCHOLESTEROLEMIA During his last visit he is LDL was about target at 130 and HDL at 40. Unfortunately Mr. Monce does not tolerate statins even at low dose of 10 mg of pravastatin. He is advised to continue with omega-3 fatty acids ,avoid butter and fried food and exercise regularly.  3. Essential hypertension  Blood pressure today slightly about target at 142/90. Patient is advised to continue amlodipine.   Follow up plan: Return in about 3 months (around 03/11/2015) for diabetes, high blood pressure, high cholesterol, follow up with pre-visit labs, meter, and logs.  Glade Lloyd, MD Phone: [phone]  Fax: [phone]   12/10/2014, 2:44 PM

## 2014-12-21 ENCOUNTER — Other Ambulatory Visit: Payer: Self-pay | Admitting: Pulmonary Disease

## 2015-01-30 ENCOUNTER — Other Ambulatory Visit: Payer: Self-pay | Admitting: Pulmonary Disease

## 2015-03-03 ENCOUNTER — Encounter: Payer: Self-pay | Admitting: Gastroenterology

## 2015-03-03 ENCOUNTER — Encounter: Payer: Self-pay | Admitting: Cardiology

## 2015-03-04 ENCOUNTER — Other Ambulatory Visit: Payer: Self-pay | Admitting: "Endocrinology

## 2015-03-04 DIAGNOSIS — E039 Hypothyroidism, unspecified: Secondary | ICD-10-CM

## 2015-03-04 DIAGNOSIS — IMO0002 Reserved for concepts with insufficient information to code with codable children: Secondary | ICD-10-CM

## 2015-03-04 DIAGNOSIS — E559 Vitamin D deficiency, unspecified: Secondary | ICD-10-CM

## 2015-03-04 DIAGNOSIS — E1169 Type 2 diabetes mellitus with other specified complication: Principal | ICD-10-CM

## 2015-03-04 DIAGNOSIS — E1165 Type 2 diabetes mellitus with hyperglycemia: Secondary | ICD-10-CM

## 2015-03-04 DIAGNOSIS — E785 Hyperlipidemia, unspecified: Secondary | ICD-10-CM

## 2015-03-04 LAB — COMPREHENSIVE METABOLIC PANEL
ALBUMIN: 4.3 g/dL (ref 3.6–5.1)
ALT: 23 U/L (ref 9–46)
AST: 23 U/L (ref 10–35)
Alkaline Phosphatase: 71 U/L (ref 40–115)
BILIRUBIN TOTAL: 0.5 mg/dL (ref 0.2–1.2)
BUN: 14 mg/dL (ref 7–25)
CO2: 27 mmol/L (ref 20–31)
CREATININE: 1.22 mg/dL — AB (ref 0.70–1.18)
Calcium: 9.4 mg/dL (ref 8.6–10.3)
Chloride: 104 mmol/L (ref 98–110)
Glucose, Bld: 130 mg/dL — ABNORMAL HIGH (ref 65–99)
Potassium: 4.6 mmol/L (ref 3.5–5.3)
SODIUM: 143 mmol/L (ref 135–146)
TOTAL PROTEIN: 7.1 g/dL (ref 6.1–8.1)

## 2015-03-04 LAB — HEMOGLOBIN A1C
Hgb A1c MFr Bld: 7.5 % — ABNORMAL HIGH (ref <5.7)
Mean Plasma Glucose: 169 mg/dL — ABNORMAL HIGH (ref <117)

## 2015-03-04 LAB — LIPID PANEL
CHOLESTEROL: 181 mg/dL (ref 125–200)
HDL: 34 mg/dL — ABNORMAL LOW (ref >=40)
LDL Cholesterol: 97 mg/dL (ref <130)
TRIGLYCERIDES: 250 mg/dL — AB (ref <150)
Total CHOL/HDL Ratio: 5.3 Ratio — ABNORMAL HIGH (ref <=5.0)
VLDL: 50 mg/dL — ABNORMAL HIGH (ref <30)

## 2015-03-04 LAB — TSH: TSH: 1.72 u[IU]/mL (ref 0.350–4.500)

## 2015-03-04 LAB — T4, FREE: FREE T4: 0.9 ng/dL (ref 0.80–1.80)

## 2015-03-05 LAB — VITAMIN D 25 HYDROXY (VIT D DEFICIENCY, FRACTURES): VIT D 25 HYDROXY: 27 ng/mL — AB (ref 30–100)

## 2015-03-11 ENCOUNTER — Encounter: Payer: Self-pay | Admitting: "Endocrinology

## 2015-03-11 ENCOUNTER — Ambulatory Visit (INDEPENDENT_AMBULATORY_CARE_PROVIDER_SITE_OTHER): Payer: Commercial Managed Care - HMO | Admitting: "Endocrinology

## 2015-03-11 VITALS — BP 137/72 | HR 72 | Ht 71.0 in | Wt 230.0 lb

## 2015-03-11 DIAGNOSIS — Z794 Long term (current) use of insulin: Secondary | ICD-10-CM

## 2015-03-11 DIAGNOSIS — E785 Hyperlipidemia, unspecified: Secondary | ICD-10-CM | POA: Diagnosis not present

## 2015-03-11 DIAGNOSIS — E1122 Type 2 diabetes mellitus with diabetic chronic kidney disease: Secondary | ICD-10-CM | POA: Diagnosis not present

## 2015-03-11 DIAGNOSIS — I10 Essential (primary) hypertension: Secondary | ICD-10-CM | POA: Diagnosis not present

## 2015-03-11 DIAGNOSIS — N182 Chronic kidney disease, stage 2 (mild): Secondary | ICD-10-CM

## 2015-03-11 NOTE — Patient Instructions (Signed)

## 2015-03-11 NOTE — Progress Notes (Signed)
Subjective:    Patient ID: Hector Rose, male    DOB: Apr 02, 1940,    Past Medical History  Diagnosis Date  . Pulmonary nodule 04-11-11    "PT. NOT AWARE" -denies problems with breathing  . Hypertension   . DVT femoral (deep venous thrombosis) with thrombophlebitis ([place]) 04-11-11    ?'09-tx. warfarin, pt being bridge with Lovenox 30mg  x2daily,x 5days  . Hypercholesteremia   . Hemorrhoids   . BPH (benign prostatic hypertrophy) with urinary obstruction   . Renal calculus   . DJD (degenerative joint disease)   . Gout 04-11-11     ankle 2 yrs ago  . Carpal tunnel syndrome   . Keloid 04-11-11    multiple-arms,back, chest  . Difficult intubation 04-11-11    08-11-97 some issues with intubation/record with chart  . GERD (gastroesophageal reflux disease) 04-11-11    mild, no med in 1 month  . DM (diabetes mellitus) ([place]) 04-11-11    dx. 8-10 yrs. -oral meds ,cbg's(120-150)  . Asthma 04-11-11    AS CHILD ONLY  . Thyroid disease     hypothyroid   Past Surgical History  Procedure Laterality Date  . Prostatectomy  1999    Dr. Rosana Hoes  . Cosmetic surgery  2000    Keloid injections  . Ortho surgery on fingers  2005 & 2008    Dr. Lorelle Formosa finger release  . Cystoscopy with biopsy  04/14/2011    Procedure: CYSTOSCOPY WITH BIOPSY;  Surgeon: Dutch Gray, MD;  Location: [facility];  Service: Urology;  Laterality: N/A;  Bladder Biopsies   . Cystoscopy/retrograde/ureteroscopy  04/14/2011    Procedure: CYSTOSCOPY/RETROGRADE/URETEROSCOPY;  Surgeon: Dutch Gray, MD;  Location: [facility];  Service: Urology;  Laterality: Bilateral;  (Bil) RPG   . Cataract surgery  12/2013   Social History   Social History  . Marital Status: Married    Spouse Name: N/A  . Number of Children: 2  . Years of Education: N/A   Occupational History  . retired from [employer]  . Smoking status: Never Smoker   . Smokeless tobacco: Never Used  . Alcohol Use: 0.0 oz/week    0 Standard drinks  or equivalent per week     Comment: social use  . Drug Use: No  . Sexual Activity: Yes   Other Topics Concern  . None   Social History Narrative   Outpatient Encounter Prescriptions as of 03/11/2015  Medication Sig  . ACCU-CHEK AVIVA PLUS test strip TEST TWICE DAILY.  Marland Kitchen ACCU-CHEK SOFTCLIX LANCETS lancets TEST TWICE DAILY.  Marland Kitchen acetaminophen (TYLENOL) 500 MG tablet Take 1,000 mg by mouth every 6 (six) hours as needed for moderate pain.  Marland Kitchen allopurinol (ZYLOPRIM) 300 MG tablet TAKE 1 TABLET BY MOUTH ONCE DAILY  . amLODipine (NORVASC) 10 MG tablet TAKE 1 TABLET EVERY MORNING  . insulin NPH-regular Human (NOVOLIN 70/30) (70-30) 100 UNIT/ML injection Inject into the skin 2 (two) times daily with a meal. 36 units with breakfast and 24 units with supper when glucose is above 90  . INSULIN SYRINGE .5CC/29G 29G X 1/2" 0.5 ML MISC 1 each by Does not apply route 2 (two) times daily.  . metoprolol (LOPRESSOR) 50 MG tablet TAKE 1 TABLET TWICE DAILY  . Multiple Vitamin (MULITIVITAMIN WITH MINERALS) TABS Take 1 tablet by mouth daily. Mens One a Day.  . Omega-3 Fatty Acids (FISH OIL PO) Take 1 capsule by mouth daily.  Marland Kitchen omeprazole (PRILOSEC) 20 MG capsule TAKE  ONE CAPSULE BY MOUTH ONCE DAILY AS NEEDED FOR STOMACH ACID  . Rivaroxaban (XARELTO) 15 MG TABS tablet Take 1 tablet (15 mg total) by mouth 2 (two) times daily. (Patient taking differently: Take 15 mg by mouth daily with supper. )   No facility-administered encounter medications on file as of 03/11/2015.   ALLERGIES: Allergies  Allergen Reactions  . Ace Inhibitors Swelling    REACTION: angio edema (swelling of lips)  . Metformin And Related     itching   VACCINATION STATUS: Immunization History  Administered Date(s) Administered  . Influenza Split 11/27/2011, 12/30/2012, 01/01/2014  . Influenza Whole 01/27/2009, 11/30/2009, 11/17/2010  . Pneumococcal Polysaccharide-23 12/02/2008    Diabetes He presents for his follow-up diabetic visit.  He has type 2 diabetes mellitus. Onset time: Diagnosed approximately  at age 36. His disease course has been improving. Pertinent negatives for hypoglycemia include no confusion, headaches, pallor or seizures. Pertinent negatives for diabetes include no chest pain, no fatigue, no polydipsia, no polyphagia, no polyuria and no weakness. Symptoms are improving. His weight is stable. He is following a generally unhealthy diet. Meal planning includes ADA exchanges. He participates in exercise intermittently. His breakfast blood glucose range is generally 140-180 mg/dl. His dinner blood glucose range is generally 140-180 mg/dl.  Hypertension This is a chronic problem. The current episode started more than 1 year ago. The problem is controlled. Pertinent negatives include no chest pain, headaches, neck pain, palpitations or shortness of breath. Past treatments include calcium channel blockers.  Hyperlipidemia This is a chronic problem. The current episode started more than 1 year ago. The problem is uncontrolled. Pertinent negatives include no chest pain, myalgias or shortness of breath. Current antihyperlipidemic treatment includes bile acid squestrants. Risk factors for coronary artery disease include dyslipidemia and diabetes mellitus.     Review of Systems  Constitutional: Negative for fatigue and unexpected weight change.  HENT: Negative for dental problem, mouth sores and trouble swallowing.   Eyes: Negative for visual disturbance.  Respiratory: Negative for cough, choking, chest tightness, shortness of breath and wheezing.   Cardiovascular: Negative for chest pain, palpitations and leg swelling.  Gastrointestinal: Negative for nausea, vomiting, abdominal pain, diarrhea, constipation and abdominal distention.  Endocrine: Negative for polydipsia, polyphagia and polyuria.  Genitourinary: Negative for dysuria, urgency, hematuria and flank pain.  Musculoskeletal: Negative for myalgias, back pain, joint  swelling, gait problem and neck pain.  Skin: Negative for pallor, rash and wound.  Neurological: Negative for seizures, syncope, weakness, numbness and headaches.  Psychiatric/Behavioral: Negative for suicidal ideas, hallucinations, confusion and dysphoric mood.    Objective:    BP 137/72 mmHg  Pulse 72  Ht 5\' 11"  (1.803 m)  Wt 230 lb (104.327 kg)  BMI 32.09 kg/m2  SpO2 99%  Wt Readings from Last 3 Encounters:  03/11/15 230 lb (104.327 kg)  12/10/14 231 lb (104.781 kg)  09/09/14 223 lb (101.152 kg)    Physical Exam  Constitutional: He is oriented to person, place, and time. He appears well-developed and well-nourished. He is cooperative.  HENT:  Head: Normocephalic and atraumatic.  Eyes: EOM are normal.  Neck: Normal range of motion. Neck supple. No tracheal deviation present. No thyromegaly present.  Cardiovascular: Normal rate and normal heart sounds.  Exam reveals no gallop.   No murmur heard. Pulses:      Carotid pulses are 2+ on the right side.      Radial pulses are 2+ on the right side.       Dorsalis pedis  pulses are 2+ on the right side.       Posterior tibial pulses are 2+ on the right side.  Pulmonary/Chest: Breath sounds normal. No respiratory distress. He has no wheezes.  Abdominal: Soft. Bowel sounds are normal. He exhibits no distension. There is no hepatosplenomegaly. There is no tenderness. There is no guarding and no CVA tenderness.  Musculoskeletal: He exhibits no edema.       Right shoulder: He exhibits no swelling and no deformity.  Lymphadenopathy:       Head (right side): No submental and no submandibular adenopathy present.       Head (left side): No submental and no submandibular adenopathy present.       Right cervical: No superficial cervical adenopathy present.      Left cervical: No superficial cervical adenopathy present.       Right: No supraclavicular adenopathy present.       Left: No supraclavicular adenopathy present.  Neurological: He is  alert and oriented to person, place, and time. He has normal strength and normal reflexes. No cranial nerve deficit or sensory deficit. Gait normal.  Skin: Skin is warm and dry. No rash noted. No cyanosis. Nails show no clubbing.  Psychiatric: He has a normal mood and affect. His speech is normal and behavior is normal. Judgment and thought content normal. Cognition and memory are normal.    Results for orders placed or performed in visit on 03/04/15  Hemoglobin A1c  Result Value Ref Range   Hgb A1c MFr Bld 7.5 (H) <5.7 %   Mean Plasma Glucose 169 (H) <117 mg/dL  Comprehensive metabolic panel  Result Value Ref Range   Sodium 143 135 - 146 mmol/L   Potassium 4.6 3.5 - 5.3 mmol/L   Chloride 104 98 - 110 mmol/L   CO2 27 20 - 31 mmol/L   Glucose, Bld 130 (H) 65 - 99 mg/dL   BUN 14 7 - 25 mg/dL   Creat 1.22 (H) 0.70 - 1.18 mg/dL   Total Bilirubin 0.5 0.2 - 1.2 mg/dL   Alkaline Phosphatase 71 40 - 115 U/L   AST 23 10 - 35 U/L   ALT 23 9 - 46 U/L   Total Protein 7.1 6.1 - 8.1 g/dL   Albumin 4.3 3.6 - 5.1 g/dL   Calcium 9.4 8.6 - 10.3 mg/dL  TSH  Result Value Ref Range   TSH 1.720 0.350 - 4.500 uIU/mL  T4, Free  Result Value Ref Range   Free T4 0.90 0.80 - 1.80 ng/dL  Lipid Panel  Result Value Ref Range   Cholesterol 181 125 - 200 mg/dL   Triglycerides 250 (H) <150 mg/dL   HDL 34 (L) >=40 mg/dL   Total CHOL/HDL Ratio 5.3 (H) <=5.0 Ratio   VLDL 50 (H) <30 mg/dL   LDL Cholesterol 97 <130 mg/dL  Vitamin D, 25-hydroxy  Result Value Ref Range   Vit D, 25-Hydroxy 27 (L) 30 - 100 ng/mL    most recent A1c from December 02, 2014 was better at 7.5%.     Assessment & Plan:   1. Type 2 diabetes mellitus with stage 2 chronic kidney disease He came with improved blood glucose profile and his A1c is improved to 7.5%. Patient is advised to stick to a routine mealtimes to eat 3 meals  a day and avoid unnecessary snacks ( to snack only to correct hypoglycemia). Patient is advised to  eliminate simple carbs  from their diet including:  cakes, desserts, ice cream,  soda (  diet and regular) , sweet tea , candies,  chips,  cookies,  artificial sweeteners,   and "sugar-free" products .  This will help stabilize  blood glucose profile and potentially help patient lose weight. Patient is given detailed personalized glucose monitoring and insulin dosing instructions. Patient is instructed to call back with extremes of blood glucose readings  less than 70 or greater than 300 mg/dl. Patient  is to bring meter and  blood glucose logs during their next visit. I plan to repeat the following labs on subsequent visits.   I advised him to decrease Novolin 70/30 to 36 units with breakfast and continue at 24 units with supper when pre-meal blood glucose is above 90.  2. HYPERCHOLESTEROLEMIA During his last visit he is LDL was about target at 130 and HDL at 40. Unfortunately Hector Rose does not tolerate statins even at low dose of 10 mg of pravastatin. He is advised to continue with omega-3 fatty acids ,avoid butter and fried food and exercise regularly.  3. Essential hypertension  Blood pressure today slightly about target at 142/90. Patient is advised to continue amlodipine.   Follow up plan: Return in about 3 months (around 06/09/2015) for diabetes, high blood pressure, high cholesterol, follow up with pre-visit labs, meter, and logs.  Glade Lloyd, MD Phone: [phone]  Fax: [phone]   03/11/2015, 2:36 PM

## 2015-04-05 ENCOUNTER — Encounter: Payer: Self-pay | Admitting: Gastroenterology

## 2015-04-07 ENCOUNTER — Emergency Department (HOSPITAL_COMMUNITY)
Admission: EM | Admit: 2015-04-07 | Discharge: 2015-04-07 | Disposition: A | Discharge status: Home / Self Care | Payer: PPO | Attending: Emergency Medicine | Admitting: Emergency Medicine

## 2015-04-07 ENCOUNTER — Emergency Department (HOSPITAL_COMMUNITY): Payer: PPO

## 2015-04-07 ENCOUNTER — Encounter (HOSPITAL_COMMUNITY): Payer: Self-pay | Admitting: Emergency Medicine

## 2015-04-07 DIAGNOSIS — M79671 Pain in right foot: Secondary | ICD-10-CM | POA: Diagnosis not present

## 2015-04-07 DIAGNOSIS — Z87430 Personal history of prostatic dysplasia: Secondary | ICD-10-CM | POA: Diagnosis not present

## 2015-04-07 DIAGNOSIS — J45909 Unspecified asthma, uncomplicated: Secondary | ICD-10-CM | POA: Diagnosis not present

## 2015-04-07 DIAGNOSIS — E1121 Type 2 diabetes mellitus with diabetic nephropathy: Secondary | ICD-10-CM | POA: Diagnosis not present

## 2015-04-07 DIAGNOSIS — Z7901 Long term (current) use of anticoagulants: Secondary | ICD-10-CM | POA: Diagnosis not present

## 2015-04-07 DIAGNOSIS — Z87442 Personal history of urinary calculi: Secondary | ICD-10-CM | POA: Insufficient documentation

## 2015-04-07 DIAGNOSIS — M109 Gout, unspecified: Secondary | ICD-10-CM | POA: Diagnosis not present

## 2015-04-07 DIAGNOSIS — I10 Essential (primary) hypertension: Secondary | ICD-10-CM | POA: Diagnosis not present

## 2015-04-07 DIAGNOSIS — E7800 Pure hypercholesterolemia, unspecified: Secondary | ICD-10-CM | POA: Insufficient documentation

## 2015-04-07 DIAGNOSIS — K219 Gastro-esophageal reflux disease without esophagitis: Secondary | ICD-10-CM | POA: Insufficient documentation

## 2015-04-07 DIAGNOSIS — E1340 Other specified diabetes mellitus with diabetic neuropathy, unspecified: Secondary | ICD-10-CM | POA: Diagnosis not present

## 2015-04-07 DIAGNOSIS — Z872 Personal history of diseases of the skin and subcutaneous tissue: Secondary | ICD-10-CM | POA: Insufficient documentation

## 2015-04-07 DIAGNOSIS — Z79899 Other long term (current) drug therapy: Secondary | ICD-10-CM | POA: Diagnosis not present

## 2015-04-07 DIAGNOSIS — Z794 Long term (current) use of insulin: Secondary | ICD-10-CM | POA: Diagnosis not present

## 2015-04-07 DIAGNOSIS — Z86718 Personal history of other venous thrombosis and embolism: Secondary | ICD-10-CM | POA: Diagnosis not present

## 2015-04-07 LAB — CBG MONITORING, ED: GLUCOSE-CAPILLARY: 119 mg/dL — AB (ref 65–99)

## 2015-04-07 MED ORDER — ACETAMINOPHEN 325 MG PO TABS
650.0000 mg | ORAL_TABLET | Freq: Once | ORAL | Status: AC
  Administered 2015-04-07: 650 mg via ORAL
  Filled 2015-04-07: qty 2

## 2015-04-07 MED ORDER — GABAPENTIN 300 MG PO CAPS: 300.0000 mg | ORAL_CAPSULE | Freq: Three times a day (TID) | ORAL | Status: DC

## 2015-04-07 NOTE — ED Notes (Signed)
Strong bilateral PT pulses noted bilaterally.

## 2015-04-07 NOTE — ED Provider Notes (Signed)
CSN: [id number]     Arrival date & time 04/07/15  1846 History  By signing my name below, I, Nicole Kindred, attest that this documentation has been prepared under the direction and in the presence of Ezequiel Essex, MD.   Electronically Signed: Nicole Kindred, ED Scribe. 04/07/2015. 8:43 PM    Chief Complaint  Patient presents with  . Foot Pain    The history is provided by the patient. No language interpreter was used.   HPI Comments: Hector Rose is a 74 y.o. male with PMHx of DM and DVT who presents to the Emergency Department complaining of gradual onset, intermittent, burning sensation to right foot x 1-2 days. Pt states that his foot is itchy but no other associated symptoms noted. Aleve taken PTA with mild relief to the pain. No worsening factors noted. He states that his left foot sometimes has the same symptoms but he is not having it currently. He was seen by a podiatrist for his symptoms and reports "he didn't tell me anything." Pt attributes that his blood glucose has been good recently and that he has been complaint with his Insulin. Pt denies any injury to foot, SOB, chest pain, weakness, fevers, appetite loss, diarrhea, constipation, or urine symptoms. Pt takes Xarelto for his DVT diagnosed in 2015 but has been out for the past 2 days.    Past Medical History  Diagnosis Date  . Pulmonary nodule 04-11-11    "PT. NOT AWARE" -denies problems with breathing  . Hypertension   . DVT femoral (deep venous thrombosis) with thrombophlebitis ([place]) 04-11-11    ?'09-tx. warfarin, pt being bridge with Lovenox 30mg  x2daily,x 5days  . Hypercholesteremia   . Hemorrhoids   . BPH (benign prostatic hypertrophy) with urinary obstruction   . Renal calculus   . DJD (degenerative joint disease)   . Gout 04-11-11     ankle 2 yrs ago  . Carpal tunnel syndrome   . Keloid 04-11-11    multiple-arms,back, chest  . Difficult intubation 04-11-11    08-11-97 some issues with intubation/record with  chart  . GERD (gastroesophageal reflux disease) 04-11-11    mild, no med in 1 month  . DM (diabetes mellitus) ([place]) 04-11-11    dx. 8-10 yrs. -oral meds ,cbg's(120-150)  . Asthma 04-11-11    AS CHILD ONLY  . Thyroid disease     hypothyroid   Past Surgical History  Procedure Laterality Date  . Prostatectomy  1999    Dr. Rosana Hoes  . Cosmetic surgery  2000    Keloid injections  . Ortho surgery on fingers  2005 & 2008    Dr. Lorelle Formosa finger release  . Cystoscopy with biopsy  04/14/2011    Procedure: CYSTOSCOPY WITH BIOPSY;  Surgeon: Dutch Gray, MD;  Location: [facility];  Service: Urology;  Laterality: N/A;  Bladder Biopsies   . Cystoscopy/retrograde/ureteroscopy  04/14/2011    Procedure: CYSTOSCOPY/RETROGRADE/URETEROSCOPY;  Surgeon: Dutch Gray, MD;  Location: [facility];  Service: Urology;  Laterality: Bilateral;  (Bil) RPG   . Cataract surgery  12/2013   No family history on file. Social History  Substance Use Topics  . Smoking status: Never Smoker   . Smokeless tobacco: Never Used  . Alcohol Use: 0.0 oz/week    0 Standard drinks or equivalent per week     Comment: social use    Review of Systems  A complete 10 system review of systems was obtained and all systems are negative except as noted in the HPI and PMH.  Allergies  Ace inhibitors and Metformin and related  Home Medications   Prior to Admission medications   Medication Sig Start Date End Date Taking? Authorizing Provider  allopurinol (ZYLOPRIM) 300 MG tablet TAKE 1 TABLET BY MOUTH ONCE DAILY 06/06/13  Yes Noralee Space, MD  amLODipine (NORVASC) 10 MG tablet TAKE 1 TABLET EVERY MORNING   Yes Noralee Space, MD  insulin NPH-regular Human (NOVOLIN 70/30) (70-30) 100 UNIT/ML injection Inject into the skin 2 (two) times daily with a meal. 36 units with breakfast and 24 units with supper when glucose is above 90   Yes Cassandria Anger, MD  metoprolol (LOPRESSOR) 50 MG tablet TAKE 1 TABLET TWICE DAILY   Yes Noralee Space, MD   Multiple Vitamin (MULITIVITAMIN WITH MINERALS) TABS Take 1 tablet by mouth daily. Mens One a Day.   Yes Historical Provider, MD  omeprazole (PRILOSEC) 20 MG capsule TAKE ONE CAPSULE BY MOUTH ONCE DAILY AS NEEDED FOR STOMACH ACID 05/22/11  Yes Noralee Space, MD  Rivaroxaban (XARELTO) 15 MG TABS tablet Take 1 tablet (15 mg total) by mouth 2 (two) times daily. Patient taking differently: Take 15 mg by mouth daily with supper.  12/22/13  Yes Daleen Bo, MD  ACCU-CHEK AVIVA PLUS test strip TEST TWICE DAILY. 09/21/14   Noralee Space, MD  ACCU-CHEK SOFTCLIX LANCETS lancets TEST TWICE DAILY. 02/01/15   Noralee Space, MD  acetaminophen (TYLENOL) 500 MG tablet Take 1,000 mg by mouth every 6 (six) hours as needed for moderate pain.    Historical Provider, MD  gabapentin (NEURONTIN) 300 MG capsule Take 1 capsule (300 mg total) by mouth 3 (three) times daily. 04/07/15   Ezequiel Essex, MD  INSULIN SYRINGE .5CC/29G 29G X 1/2" 0.5 ML MISC 1 each by Does not apply route 2 (two) times daily. 12/10/14   Cassandria Anger, MD  Omega-3 Fatty Acids (FISH OIL PO) Take 1 capsule by mouth daily.    Historical Provider, MD   BP 153/76 mmHg  Pulse 71  Temp(Src) 97.8 F (36.6 C) (Oral)  Resp 16  Ht 6\' 1"  (1.854 m)  Wt 215 lb (97.523 kg)  BMI 28.37 kg/m2  SpO2 100% Physical Exam  Constitutional: He is oriented to person, place, and time. He appears well-developed and well-nourished. No distress.  HENT:  Head: Normocephalic and atraumatic.  Mouth/Throat: Oropharynx is clear and moist. No oropharyngeal exudate.  Eyes: Conjunctivae and EOM are normal. Pupils are equal, round, and reactive to light.  Neck: Normal range of motion. Neck supple.  No meningismus.  Cardiovascular: Normal rate, regular rhythm, normal heart sounds and intact distal pulses.   No murmur heard. Intact DP pulses bilaterally.  Unable to palpate PT but present with doppler  Pulmonary/Chest: Effort normal and breath sounds normal. No  respiratory distress.  Abdominal: Soft. There is no tenderness. There is no rebound and no guarding.  Musculoskeletal: Normal range of motion. He exhibits no edema or tenderness.  Full range of motion bilateral ankles.  No calf tenderness.   Neurological: He is alert and oriented to person, place, and time. No cranial nerve deficit. He exhibits normal muscle tone. Coordination normal.  No ataxia on finger to nose bilaterally. No pronator drift. 5/5 strength throughout. CN 2-12 intact.Equal grip strength. Sensation intact.   Skin: Skin is warm.  No open wound.   Psychiatric: He has a normal mood and affect. His behavior is normal.  Nursing note and vitals reviewed.   ED Course  Procedures  DIAGNOSTIC STUDIES: Oxygen Saturation is 100% on RA, normal by my interpretation.    COORDINATION OF CARE:  8:31 PM-Discussed treatment plan which includes CBG and [place] of foot with pt at bedside and pt agreed to plan.   Labs Review Labs Reviewed  CBG MONITORING, ED - Abnormal; Notable for the following:    Glucose-Capillary 119 (*)    All other components within normal limits    Imaging Review Dg Foot Complete Right  04/07/2015  CLINICAL DATA:  Lateral left foot pain and burning for 2 days. No known injury. Initial encounter. EXAM: RIGHT FOOT COMPLETE - 3+ VIEW COMPARISON:  None. FINDINGS: No acute bony or joint abnormality is identified. Calcaneal spurring is seen. Midfoot and first MTP osteoarthritis also noted. IMPRESSION: No acute abnormality. Midfoot and first MTP osteoarthritis. Calcaneal spurring. Electronically Signed   By: Inge Rise M.D.   On: 04/07/2015 20:07   I have personally reviewed and evaluated these images and lab results as part of my medical decision-making.   EKG Interpretation None      MDM   Final diagnoses:  Diabetic neuropathy associated with other specified diabetes mellitus ([place])   Gradual worsening foot pain over the past several days. He describes  burning pain in right lateral foot. Denies trauma. History of diabetes.  Patient with history of DVT. States he has not had a Xarelto for about 2 days. Denies any chest pain or shortness of breath. No calf swelling or asymmetry.   Intact DP and PT pulses. No open wounds. X-ray negative.  Concern for diabetic neuropathy. CBG 119.  We'll start gabapentin. Patient has PCP follow-up in 2 days. Advised to restart his medications including his Xarelto. Return precautions discussed.  I personally performed the services described in this documentation, which was scribed in my presence. The recorded information has been reviewed and is accurate.      Ezequiel Essex, MD 04/07/15 [phone]

## 2015-04-07 NOTE — Discharge Instructions (Signed)

## 2015-04-07 NOTE — ED Notes (Signed)
Pt reports RT sided foot pain. States it feels like it is burning. Pt is a diabetic. Pt ambulatory. Denies injury.

## 2015-04-09 DIAGNOSIS — Z1389 Encounter for screening for other disorder: Secondary | ICD-10-CM | POA: Diagnosis not present

## 2015-04-09 DIAGNOSIS — E1165 Type 2 diabetes mellitus with hyperglycemia: Secondary | ICD-10-CM | POA: Diagnosis not present

## 2015-04-09 DIAGNOSIS — G64 Other disorders of peripheral nervous system: Secondary | ICD-10-CM | POA: Diagnosis not present

## 2015-04-09 DIAGNOSIS — E6609 Other obesity due to excess calories: Secondary | ICD-10-CM | POA: Diagnosis not present

## 2015-04-09 DIAGNOSIS — E1140 Type 2 diabetes mellitus with diabetic neuropathy, unspecified: Secondary | ICD-10-CM | POA: Diagnosis not present

## 2015-04-09 DIAGNOSIS — Z6831 Body mass index (BMI) 31.0-31.9, adult: Secondary | ICD-10-CM | POA: Diagnosis not present

## 2015-05-11 ENCOUNTER — Other Ambulatory Visit: Payer: Self-pay | Admitting: "Endocrinology

## 2015-05-24 ENCOUNTER — Other Ambulatory Visit: Payer: Self-pay | Admitting: Pulmonary Disease

## 2015-05-27 ENCOUNTER — Other Ambulatory Visit: Payer: Self-pay | Admitting: *Deleted

## 2015-05-27 MED ORDER — ACCU-CHEK SOFTCLIX LANCETS MISC: Status: DC

## 2015-05-28 LAB — PULMONARY FUNCTION TEST
DL/VA % pred: 88 %
DL/VA: 4.23 ml/min/mmHg/L
DLCO UNC: 20.25 ml/min/mmHg
DLCO cor % pred: 55 %
DLCO cor: 20.42 ml/min/mmHg
DLCO unc % pred: 54 %
FEF 25-75 POST: 1.49 L/s
FEF 25-75 Pre: 0.98 L/sec
FEF2575-%CHANGE-POST: 51 %
FEF2575-%PRED-PRE: 35 %
FEF2575-%Pred-Post: 54 %
FEV1-%Change-Post: 9 %
FEV1-%PRED-PRE: 62 %
FEV1-%Pred-Post: 68 %
FEV1-POST: 2.24 L
FEV1-Pre: 2.05 L
FEV1FVC-%CHANGE-POST: -2 %
FEV1FVC-%Pred-Pre: 88 %
FEV6-%CHANGE-POST: 11 %
FEV6-%Pred-Post: 80 %
FEV6-%Pred-Pre: 71 %
FEV6-Post: 3.33 L
FEV6-Pre: 2.98 L
FEV6FVC-%Change-Post: 0 %
FEV6FVC-%Pred-Post: 101 %
FEV6FVC-%Pred-Pre: 101 %
FVC-%CHANGE-POST: 11 %
FVC-%Pred-Post: 78 %
FVC-%Pred-Pre: 70 %
FVC-Post: 3.42 L
FVC-Pre: 3.06 L
POST FEV1/FVC RATIO: 66 %
PRE FEV1/FVC RATIO: 67 %
Post FEV6/FVC ratio: 97 %
Pre FEV6/FVC Ratio: 97 %
RV % pred: 115 %
RV: 3.1 L
TLC % pred: 74 %
TLC: 5.75 L

## 2015-06-02 DIAGNOSIS — J069 Acute upper respiratory infection, unspecified: Secondary | ICD-10-CM | POA: Diagnosis not present

## 2015-06-02 DIAGNOSIS — Z6830 Body mass index (BMI) 30.0-30.9, adult: Secondary | ICD-10-CM | POA: Diagnosis not present

## 2015-06-02 DIAGNOSIS — I10 Essential (primary) hypertension: Secondary | ICD-10-CM | POA: Diagnosis not present

## 2015-06-02 DIAGNOSIS — Z1389 Encounter for screening for other disorder: Secondary | ICD-10-CM | POA: Diagnosis not present

## 2015-06-02 DIAGNOSIS — J019 Acute sinusitis, unspecified: Secondary | ICD-10-CM | POA: Diagnosis not present

## 2015-06-02 DIAGNOSIS — J343 Hypertrophy of nasal turbinates: Secondary | ICD-10-CM | POA: Diagnosis not present

## 2015-06-04 ENCOUNTER — Other Ambulatory Visit: Payer: Self-pay | Admitting: "Endocrinology

## 2015-06-04 DIAGNOSIS — Z794 Long term (current) use of insulin: Secondary | ICD-10-CM | POA: Diagnosis not present

## 2015-06-04 DIAGNOSIS — E1122 Type 2 diabetes mellitus with diabetic chronic kidney disease: Secondary | ICD-10-CM | POA: Diagnosis not present

## 2015-06-04 DIAGNOSIS — N182 Chronic kidney disease, stage 2 (mild): Secondary | ICD-10-CM | POA: Diagnosis not present

## 2015-06-04 LAB — BASIC METABOLIC PANEL
BUN: 14 mg/dL (ref 7–25)
CO2: 31 mmol/L (ref 20–31)
Calcium: 9.6 mg/dL (ref 8.6–10.3)
Chloride: 103 mmol/L (ref 98–110)
Creat: 1.24 mg/dL — ABNORMAL HIGH (ref 0.70–1.18)
GLUCOSE: 138 mg/dL — AB (ref 65–99)
POTASSIUM: 4.7 mmol/L (ref 3.5–5.3)
Sodium: 141 mmol/L (ref 135–146)

## 2015-06-04 LAB — HEMOGLOBIN A1C
HEMOGLOBIN A1C: 8.6 % — AB (ref <5.7)
Mean Plasma Glucose: 200 mg/dL — ABNORMAL HIGH (ref <117)

## 2015-06-09 ENCOUNTER — Encounter: Payer: Self-pay | Admitting: Gastroenterology

## 2015-06-09 ENCOUNTER — Telehealth: Payer: Self-pay

## 2015-06-09 ENCOUNTER — Ambulatory Visit (INDEPENDENT_AMBULATORY_CARE_PROVIDER_SITE_OTHER): Payer: PPO | Admitting: Gastroenterology

## 2015-06-09 VITALS — BP 120/70 | HR 72 | Ht 73.0 in | Wt 221.0 lb

## 2015-06-09 DIAGNOSIS — Z7901 Long term (current) use of anticoagulants: Secondary | ICD-10-CM | POA: Diagnosis not present

## 2015-06-09 DIAGNOSIS — R1310 Dysphagia, unspecified: Secondary | ICD-10-CM

## 2015-06-09 NOTE — Patient Instructions (Signed)
You have been scheduled for a Barium Esophogram at [facility] on 06/14/15 at 9:00am. Please arrive 15 minutes prior to your appointment for registration. Make certain not to have anything to eat or drink 6 hours prior to your test. If you need to reschedule for any reason, please contact radiology at [phone] to do so. __________________________________________________________________ A barium swallow is an examination that concentrates on views of the esophagus. This tends to be a double contrast exam (barium and two liquids which, when combined, create a gas to distend the wall of the oesophagus) or single contrast (non-ionic iodine based). The study is usually tailored to your symptoms so a good history is essential. Attention is paid during the study to the form, structure and configuration of the esophagus, looking for functional disorders (such as aspiration, dysphagia, achalasia, motility and reflux) EXAMINATION You may be asked to change into a gown, depending on the type of swallow being performed. A radiologist and radiographer will perform the procedure. The radiologist will advise you of the type of contrast selected for your procedure and direct you during the exam. You will be asked to stand, sit or lie in several different positions and to hold a small amount of fluid in your mouth before being asked to swallow while the imaging is performed .In some instances you may be asked to swallow barium coated marshmallows to assess the motility of a solid food bolus. The exam can be recorded as a digital or video fluoroscopy procedure. POST PROCEDURE It will take 1-2 days for the barium to pass through your system. To facilitate this, it is important, unless otherwise directed, to increase your fluids for the next 24-48hrs and to resume your normal diet.  This test typically takes about 30 minutes to  perform. __________________________________________________________________________________  Hector Rose have been scheduled for an endoscopy. Please follow written instructions given to you at your visit today. If you use inhalers (even only as needed), please bring them with you on the day of your procedure. Marland Kitchen

## 2015-06-09 NOTE — Progress Notes (Signed)
    History of Present Illness: This is a 75 year old male referred by Sharilyn Sites, MD for the evaluation of dysphagia. He relates a 2 year history of intermittent difficulties swallowing sandwiches and large pills he states the symptoms promptly resolved with drinking fluids. His symptoms have increased in frequency and severity over the past few months. He takes omeprazole daily with excellent control of chronic GERD symptoms. He has no other gastrointestinal complaints. He underwent colonoscopy in August 2008 was unremarkable showing only small internal hemorrhoids. Denies weight loss, abdominal pain, constipation, diarrhea, change in stool caliber, melena, hematochezia, nausea, vomiting, reflux symptoms, chest pain.   Review of Systems: Pertinent positive and negative review of systems were noted in the above HPI section. All other review of systems were otherwise negative.  Current Medications, Allergies, Past Medical History, Past Surgical History, Family History and Social History were reviewed in [employer] record.  Physical Exam: General: Well developed, well nourished, no acute distress Head: Normocephalic and atraumatic Eyes:  sclerae anicteric, EOMI Ears: Normal auditory acuity Mouth: No deformity or lesions Neck: Supple, no masses or thyromegaly Lungs: Clear throughout to auscultation Heart: Regular rate and rhythm; no murmurs, rubs or bruits Abdomen: Soft, non tender and non distended. No masses, hepatosplenomegaly or hernias noted. Normal Bowel sounds Musculoskeletal: Symmetrical with no gross deformities  Skin: No lesions on visible extremities Pulses:  Normal pulses noted Extremities: No clubbing, cyanosis, edema or deformities noted Neurological: Alert oriented x 4, grossly nonfocal Cervical Nodes:  No significant cervical adenopathy Inguinal Nodes: No significant inguinal adenopathy Psychological:  Alert and cooperative. Normal mood and  affect  Assessment and Recommendations:  1. Dysphagia, solids and pills. GERD with reflux symptoms well controlled on omeprazole. R/O stricture, motility disorder. Schedule barium esophagram with tablet and EGD with possible dilation. The risks (including bleeding, perforation, infection, missed lesions, medication reactions and possible hospitalization or surgery if complications occur), benefits, and alternatives to endoscopy with possible biopsy and possible dilation were discussed with the patient and they consent to proceed.    2. History of recurrent DVTs. Hold Xarelto 2 days before procedure - will instruct when and how to resume after procedure. Low but real risk of cardiovascular event such as heart attack, stroke, embolism, thrombosis or ischemia/infarct of other organs off Xarleto explained and need to seek urgent help if this occurs. The patient consents to proceed. Will communicate by phone or EMR with patient's prescribing provider to confirm that holding Xarelto is reasonable in this case.   3. Colorectal cancer screening, average risk. 10 year interval screening colonoscopy recommended in August 2018.  cc: Sharilyn Sites, MD

## 2015-06-09 NOTE — Telephone Encounter (Signed)
  06/09/2015   RE: Hector Rose DOB: Jul 03, 1940 MRN: [id number]   Dear Dr. Hilma Favors,    We have scheduled the above patient for an endoscopic procedure. Our records show that he is on anticoagulation therapy.   Please advise as to how long the patient may come off his therapy of Xarelto prior to the procedure, which is scheduled for 06/29/15.  Please fax back/ your answer to Marlon Pel, [place] at [phone].   Sincerely,    Marlon Pel, CMA

## 2015-06-14 ENCOUNTER — Ambulatory Visit (INDEPENDENT_AMBULATORY_CARE_PROVIDER_SITE_OTHER): Payer: PPO | Admitting: "Endocrinology

## 2015-06-14 ENCOUNTER — Encounter: Payer: Self-pay | Admitting: "Endocrinology

## 2015-06-14 ENCOUNTER — Ambulatory Visit (HOSPITAL_COMMUNITY)
Admission: RE | Admit: 2015-06-14 | Discharge: 2015-06-14 | Disposition: A | Discharge status: Home / Self Care | Payer: PPO | Source: Ambulatory Visit | Attending: Gastroenterology | Admitting: Gastroenterology

## 2015-06-14 VITALS — BP 126/74 | HR 86 | Ht 73.0 in | Wt 223.0 lb

## 2015-06-14 DIAGNOSIS — Z794 Long term (current) use of insulin: Secondary | ICD-10-CM | POA: Diagnosis not present

## 2015-06-14 DIAGNOSIS — N182 Chronic kidney disease, stage 2 (mild): Secondary | ICD-10-CM

## 2015-06-14 DIAGNOSIS — E785 Hyperlipidemia, unspecified: Secondary | ICD-10-CM | POA: Diagnosis not present

## 2015-06-14 DIAGNOSIS — I10 Essential (primary) hypertension: Secondary | ICD-10-CM

## 2015-06-14 DIAGNOSIS — R1310 Dysphagia, unspecified: Secondary | ICD-10-CM

## 2015-06-14 DIAGNOSIS — K224 Dyskinesia of esophagus: Secondary | ICD-10-CM | POA: Diagnosis not present

## 2015-06-14 DIAGNOSIS — E1122 Type 2 diabetes mellitus with diabetic chronic kidney disease: Secondary | ICD-10-CM | POA: Diagnosis not present

## 2015-06-14 NOTE — Progress Notes (Signed)
Subjective:    Patient ID: Hector Rose, male    DOB: June 05, 1940,    Past Medical History  Diagnosis Date  . Pulmonary nodule 04-11-11    "PT. NOT AWARE" -denies problems with breathing  . Hypertension   . DVT femoral (deep venous thrombosis) with thrombophlebitis ([place]) 04-11-11    ?'09-tx. warfarin, pt being bridge with Lovenox 30mg  x2daily,x 5days  . Hypercholesteremia   . Hemorrhoids   . BPH (benign prostatic hypertrophy) with urinary obstruction   . Renal calculus   . DJD (degenerative joint disease)   . Gout 04-11-11     ankle 2 yrs ago  . Carpal tunnel syndrome   . Keloid 04-11-11    multiple-arms,back, chest  . Difficult intubation 04-11-11    08-11-97 some issues with intubation/record with chart  . GERD (gastroesophageal reflux disease) 04-11-11    mild, no med in 1 month  . DM (diabetes mellitus) ([place]) 04-11-11    dx. 8-10 yrs. -oral meds ,cbg's(120-150)  . Asthma 04-11-11    AS CHILD ONLY  . Thyroid disease     hypothyroid  . Internal hemorrhoids    Past Surgical History  Procedure Laterality Date  . Prostatectomy  1999    Dr. Rosana Hoes  . Cosmetic surgery  2000    Keloid injections  . Ortho surgery on fingers  2005 & 2008    Dr. Lorelle Formosa finger release  . Cystoscopy with biopsy  04/14/2011    Procedure: CYSTOSCOPY WITH BIOPSY;  Surgeon: Dutch Gray, MD;  Location: [facility];  Service: Urology;  Laterality: N/A;  Bladder Biopsies   . Cystoscopy/retrograde/ureteroscopy  04/14/2011    Procedure: CYSTOSCOPY/RETROGRADE/URETEROSCOPY;  Surgeon: Dutch Gray, MD;  Location: [facility];  Service: Urology;  Laterality: Bilateral;  ([place]) RPG   . Cataract surgery  12/2013   Social History   Social History  . Marital Status: Married    Spouse Name: N/A  . Number of Children: 2  . Years of Education: N/A   Occupational History  . retired from [employer]  . Smoking status: Never Smoker   . Smokeless tobacco: Never Used  . Alcohol Use: 0.0  oz/week    0 Standard drinks or equivalent per week     Comment: social use  . Drug Use: No  . Sexual Activity: Yes   Other Topics Concern  . None   Social History Narrative   Outpatient Encounter Prescriptions as of 06/14/2015  Medication Sig  . allopurinol (ZYLOPRIM) 300 MG tablet TAKE 1 TABLET BY MOUTH ONCE DAILY  . amLODipine (NORVASC) 10 MG tablet TAKE 1 TABLET EVERY MORNING  . gabapentin (NEURONTIN) 300 MG capsule Take 1 capsule (300 mg total) by mouth 3 (three) times daily.  . Insulin NPH Isophane & Regular (NOVOLIN 70/30 [place]) Inject 25-40 Units into the skin 2 (two) times daily before a meal. 40 units with breakfast and 25 units with supper if blood glucose is above 90  . metoprolol (LOPRESSOR) 50 MG tablet TAKE 1 TABLET TWICE DAILY  . Multiple Vitamin (MULITIVITAMIN WITH MINERALS) TABS Take 1 tablet by mouth daily. Mens One a Day.  . Omega-3 Fatty Acids (FISH OIL PO) Take 1 capsule by mouth daily.  Marland Kitchen omeprazole (PRILOSEC) 20 MG capsule TAKE ONE CAPSULE BY MOUTH ONCE DAILY AS NEEDED FOR STOMACH ACID  . Rivaroxaban (XARELTO) 15 MG TABS tablet Take 1 tablet (15 mg total) by mouth 2 (two) times daily. (Patient taking differently: Take 15  mg by mouth daily with supper. )  . vitamin B-12 (CYANOCOBALAMIN) 500 MCG tablet Take 500 mcg by mouth daily.  . [DISCONTINUED] NOVOLIN 70/30 RELION (70-30) 100 UNIT/ML injection INJECT 30 UNITS SUBCUTANEOUSLY TWICE DAILY (WITH  BREAKFAST  AND  SUPPER)  --  DISCONTINUE  TOUJEO  . acetaminophen (TYLENOL) 500 MG tablet Take 1,000 mg by mouth every 6 (six) hours as needed for moderate pain.  . INSULIN SYRINGE .5CC/29G 29G X 1/2" 0.5 ML MISC 1 each by Does not apply route 2 (two) times daily.  . [DISCONTINUED] ACCU-CHEK AVIVA PLUS test strip TEST TWICE DAILY.  . [DISCONTINUED] ACCU-CHEK SOFTCLIX LANCETS lancets Use as instructed   No facility-administered encounter medications on file as of 06/14/2015.   ALLERGIES: Allergies  Allergen Reactions  . Ace  Inhibitors Swelling    REACTION: angio edema (swelling of lips)  . Metformin And Related     itching   VACCINATION STATUS: Immunization History  Administered Date(s) Administered  . Influenza Split 11/27/2011, 12/30/2012, 01/01/2014  . Influenza Whole 01/27/2009, 11/30/2009, 11/17/2010  . Pneumococcal Polysaccharide-23 12/02/2008    Diabetes He presents for his follow-up diabetic visit. He has type 2 diabetes mellitus. Onset time: Diagnosed approximately  at age 71. His disease course has been worsening. Pertinent negatives for hypoglycemia include no confusion, headaches, pallor or seizures. Pertinent negatives for diabetes include no chest pain, no fatigue, no polydipsia, no polyphagia, no polyuria and no weakness. Symptoms are worsening. Risk factors for coronary artery disease include diabetes mellitus, dyslipidemia, hypertension, male sex and sedentary lifestyle. Current diabetic treatment includes insulin injections. His weight is stable. He is following a generally unhealthy diet. Meal planning includes ADA exchanges. He participates in exercise intermittently. His breakfast blood glucose range is generally 140-180 mg/dl. His dinner blood glucose range is generally 180-200 mg/dl.  Hypertension This is a chronic problem. The current episode started more than 1 year ago. The problem is controlled. Pertinent negatives include no chest pain, headaches, neck pain, palpitations or shortness of breath. Past treatments include calcium channel blockers.  Hyperlipidemia This is a chronic problem. The current episode started more than 1 year ago. The problem is uncontrolled. Pertinent negatives include no chest pain, myalgias or shortness of breath. Current antihyperlipidemic treatment includes bile acid squestrants. Risk factors for coronary artery disease include dyslipidemia and diabetes mellitus.     Review of Systems  Constitutional: Negative for fatigue and unexpected weight change.  HENT:  Negative for dental problem, mouth sores and trouble swallowing.   Eyes: Negative for visual disturbance.  Respiratory: Negative for cough, choking, chest tightness, shortness of breath and wheezing.   Cardiovascular: Negative for chest pain, palpitations and leg swelling.  Gastrointestinal: Negative for nausea, vomiting, abdominal pain, diarrhea, constipation and abdominal distention.  Endocrine: Negative for polydipsia, polyphagia and polyuria.  Genitourinary: Negative for dysuria, urgency, hematuria and flank pain.  Musculoskeletal: Negative for myalgias, back pain, joint swelling, gait problem and neck pain.  Skin: Negative for pallor, rash and wound.  Neurological: Negative for seizures, syncope, weakness, numbness and headaches.  Psychiatric/Behavioral: Negative for suicidal ideas, hallucinations, confusion and dysphoric mood.    Objective:    BP 126/74 mmHg  Pulse 86  Ht 6\' 1"  (1.854 m)  Wt 223 lb (101.152 kg)  BMI 29.43 kg/m2  SpO2 98%  Wt Readings from Last 3 Encounters:  06/14/15 223 lb (101.152 kg)  06/09/15 221 lb (100.245 kg)  04/07/15 215 lb (97.523 kg)    Physical Exam  Constitutional: He is oriented  to person, place, and time. He appears well-developed and well-nourished. He is cooperative.  HENT:  Head: Normocephalic and atraumatic.  Eyes: EOM are normal.  Neck: Normal range of motion. Neck supple. No tracheal deviation present. No thyromegaly present.  Cardiovascular: Normal rate and normal heart sounds.  Exam reveals no gallop.   No murmur heard. Pulses:      Carotid pulses are 2+ on the right side.      Radial pulses are 2+ on the right side.       Dorsalis pedis pulses are 2+ on the right side.       Posterior tibial pulses are 2+ on the right side.  Pulmonary/Chest: Breath sounds normal. No respiratory distress. He has no wheezes.  Abdominal: Soft. Bowel sounds are normal. He exhibits no distension. There is no hepatosplenomegaly. There is no tenderness.  There is no guarding and no CVA tenderness.  Musculoskeletal: He exhibits no edema.       Right shoulder: He exhibits no swelling and no deformity.  Lymphadenopathy:       Head (right side): No submental and no submandibular adenopathy present.       Head (left side): No submental and no submandibular adenopathy present.       Right cervical: No superficial cervical adenopathy present.      Left cervical: No superficial cervical adenopathy present.       Right: No supraclavicular adenopathy present.       Left: No supraclavicular adenopathy present.  Neurological: He is alert and oriented to person, place, and time. He has normal strength and normal reflexes. No cranial nerve deficit or sensory deficit. Gait normal.  Skin: Skin is warm and dry. No rash noted. No cyanosis. Nails show no clubbing.  Psychiatric: He has a normal mood and affect. His speech is normal and behavior is normal. Judgment and thought content normal. Cognition and memory are normal.   Recent Results (from the past 2160 hour(s))  CBG monitoring, ED     Status: Abnormal   Collection Time: 04/07/15  8:47 PM  Result Value Ref Range   Glucose-Capillary 119 (H) 65 - 99 mg/dL   Comment 1 Document in Chart   Basic metabolic panel     Status: Abnormal   Collection Time: 06/04/15  7:47 AM  Result Value Ref Range   Sodium 141 135 - 146 mmol/L   Potassium 4.7 3.5 - 5.3 mmol/L   Chloride 103 98 - 110 mmol/L   CO2 31 20 - 31 mmol/L   Glucose, Bld 138 (H) 65 - 99 mg/dL   BUN 14 7 - 25 mg/dL   Creat 1.24 (H) 0.70 - 1.18 mg/dL   Calcium 9.6 8.6 - 10.3 mg/dL  Hemoglobin A1c     Status: Abnormal   Collection Time: 06/04/15  7:47 AM  Result Value Ref Range   Hgb A1c MFr Bld 8.6 (H) <5.7 %    Comment:                                                                        According to the ADA Clinical Practice Recommendations for 2011, when HbA1c is used as a screening test:     >=6.5%   Diagnostic of  Diabetes Mellitus             (if abnormal result is confirmed)   5.7-6.4%   Increased risk of developing Diabetes Mellitus   References:Diagnosis and Classification of Diabetes Mellitus,Diabetes [id number] 1):S62-S69 and Standards of Medical Care in         Diabetes - 2011,Diabetes [id number] (Suppl 1):S11-S61.      Mean Plasma Glucose 200 (H) <117 mg/dL   Lipid Panel     Component Value Date/Time   CHOL 181 03/04/2015 0815   TRIG 250* 03/04/2015 0815   HDL 34* 03/04/2015 0815   CHOLHDL 5.3* 03/04/2015 0815   VLDL 50* 03/04/2015 0815   LDLCALC 97 03/04/2015 0815   LDLDIRECT 147.5 06/10/2012 0930     Assessment & Plan:   1. Type 2 diabetes mellitus with stage 2 chronic kidney disease He came with improved blood glucose profile and his A1c is Increased to 8.6 percent from  7.5%. Patient is advised to stick to a routine mealtimes to eat 3 meals  a day and avoid unnecessary snacks ( to snack only to correct hypoglycemia). Patient is advised to eliminate simple carbs  from their diet including:  cakes, desserts, ice cream, soda (  diet and regular) , sweet tea , candies,  chips,  cookies,  artificial sweeteners,   and "sugar-free" products .  This will help stabilize  blood glucose profile and potentially help patient lose weight. Patient is given detailed personalized glucose monitoring and insulin dosing instructions. Patient is instructed to call back with extremes of blood glucose readings  less than 70 or greater than 300 mg/dl. Patient  is to bring meter and  blood glucose logs during their next visit. I plan to repeat the following labs on subsequent visits.   I advised him to increase Novolin  70/30 to 40 units with breakfast and continue at 25 units with supper when pre-meal blood glucose is above 90.  2. HYPERCHOLESTEROLEMIA During his last visit he is LDL was about target at 130 and HDL at 40. Unfortunately Mr. Gehres does not tolerate statins even at low dose of 10 mg of pravastatin. He is  advised to continue with omega-3 fatty acids ,avoid butter and fried food and exercise regularly.  3. Essential hypertension  Blood pressure today slightly about target at 142/90. Patient is advised to continue amlodipine.   Follow up plan: Return in about 3 months (around 09/13/2015) for diabetes, high blood pressure, high cholesterol, follow up with pre-visit labs, meter, and logs.  Glade Lloyd, MD Phone: [phone]  Fax: [phone]   06/14/2015, 7:09 PM

## 2015-06-14 NOTE — Patient Instructions (Signed)

## 2015-06-15 ENCOUNTER — Encounter (HOSPITAL_COMMUNITY): Payer: Self-pay | Admitting: *Deleted

## 2015-06-15 NOTE — Telephone Encounter (Signed)
Informed patient that we are cancelling his EGD since he cannot stop his Xarelto. Went over [employer] results and for him to continue omeprazole and anti-reflux measures. Patient verbalized understanding. EGD has been cancelled.

## 2015-06-15 NOTE — Telephone Encounter (Signed)
Left a message with Dr. Delanna Ahmadi office to call me back regarding Xarelto clearance.

## 2015-06-15 NOTE — Telephone Encounter (Signed)
Per Barbera Setters (nurse from Dr. Delanna Ahmadi office) patient cannot come off Xarelto for Endoscopy. I asked Barbera Setters if patient can come off Xarelto at all or in the near future. Sheri states Dr. Hilma Favors said patient cannot come off of Xarelto at all due to DVT's. Please advise Dr. Fuller Plan.

## 2015-06-15 NOTE — Telephone Encounter (Signed)
Cancel EGD as pt is unable to stop Xarelto. BA esophagram showed: Nonspecific esophageal motility disorder. No fixed stricture. Findings from [facility] esophagram will have to suffice for the evaluation for his dysphagia. Likely motility and GERD related. Continue omeprazole and antireflux measures

## 2015-06-29 ENCOUNTER — Ambulatory Visit (HOSPITAL_COMMUNITY): Admission: RE | Admit: 2015-06-29 | Payer: PPO | Source: Ambulatory Visit | Admitting: Gastroenterology

## 2015-06-29 SURGERY — ESOPHAGOGASTRODUODENOSCOPY (EGD) WITH PROPOFOL
Anesthesia: Monitor Anesthesia Care

## 2015-07-07 ENCOUNTER — Other Ambulatory Visit: Payer: Self-pay | Admitting: "Endocrinology

## 2015-07-13 ENCOUNTER — Other Ambulatory Visit: Payer: Self-pay | Admitting: "Endocrinology

## 2015-07-16 ENCOUNTER — Ambulatory Visit: Payer: Commercial Managed Care - HMO | Admitting: Pulmonary Disease

## 2015-07-21 ENCOUNTER — Ambulatory Visit (INDEPENDENT_AMBULATORY_CARE_PROVIDER_SITE_OTHER): Payer: PPO | Admitting: Pulmonary Disease

## 2015-07-21 ENCOUNTER — Ambulatory Visit (INDEPENDENT_AMBULATORY_CARE_PROVIDER_SITE_OTHER)
Admission: RE | Admit: 2015-07-21 | Discharge: 2015-07-21 | Disposition: A | Discharge status: Home / Self Care | Payer: PPO | Source: Ambulatory Visit | Attending: Pulmonary Disease | Admitting: Pulmonary Disease

## 2015-07-21 VITALS — BP 118/68 | HR 58 | Temp 99.1°F | Ht 73.0 in | Wt 229.4 lb

## 2015-07-21 DIAGNOSIS — R911 Solitary pulmonary nodule: Secondary | ICD-10-CM | POA: Diagnosis not present

## 2015-07-21 DIAGNOSIS — Z8709 Personal history of other diseases of the respiratory system: Secondary | ICD-10-CM | POA: Diagnosis not present

## 2015-07-21 DIAGNOSIS — J948 Other specified pleural conditions: Secondary | ICD-10-CM

## 2015-07-21 NOTE — Patient Instructions (Signed)
Today we updated your med list in our EPIC system...    Continue your current medications the same...  Today we rechecked your follow up CXR to make sure the small nodule at the bottom of your right lung remains stable...    We will contact you w/ the results when available...   Keep up the good work w/ diet/ exercise/ & work on weight reduction...  Call for any questions...  Let's plan a follow up visit in 22yr, sooner if needed for breathing problems.Marland KitchenMarland Kitchen

## 2015-07-22 ENCOUNTER — Encounter: Payer: Self-pay | Admitting: Pulmonary Disease

## 2015-07-22 DIAGNOSIS — E6609 Other obesity due to excess calories: Secondary | ICD-10-CM | POA: Diagnosis not present

## 2015-07-22 DIAGNOSIS — N400 Enlarged prostate without lower urinary tract symptoms: Secondary | ICD-10-CM | POA: Diagnosis not present

## 2015-07-22 DIAGNOSIS — Z1389 Encounter for screening for other disorder: Secondary | ICD-10-CM | POA: Diagnosis not present

## 2015-07-22 DIAGNOSIS — Z6830 Body mass index (BMI) 30.0-30.9, adult: Secondary | ICD-10-CM | POA: Diagnosis not present

## 2015-07-22 NOTE — Progress Notes (Signed)
Subjective:    Patient ID: Hector Rose, male    DOB: 1940/09/23, 75 y.o.   MRN: [id number]  HPI 75 y/o BM here for a follow up visit... he has mult med prob as noted below... ~  SEE PREV EPIC NOTES FOR OLDER DATA >>    LABS 3/14:  FLP- not at goals on Feno160 & rec to add Prav40;  Chems- fairBS=113 A1c=8.3 Cr=1.3   CXR 10/14 showed stable heart size, clear lungs x right base scarring, mild degen changes in Tspine, NAD...  LABS 10/14:  FLP- at goals on Prav40+Feno160;  Chems- ok x BS=153, A1C=8.6;  CBC=wnl;  TSH=1.74;  PSA=0.86...   ~  June 30, 2013:  76mo ROV & Hector Rose indicates that he is feeling well and "doing good";  Info in EPIC indicates that he went to the ER at [facility] 12/14 but there are no ER notes to review, he had CP & was seen by DrGolding in [place]- we do not have access to his notes- pt indicates that several tests were done & he was sent to DrStClair in [place] for Cards eval & Myoview; I checked "care everywhere" in epic and EKG reported Sinus brady, 1st degree AVB, borderline tracing, but Myoview results not avail (pt indicates that it was OK) & he has not had any further check discomfort... We reviewed the following medical problems during today's office visit >>     Known right base nodule> lesion on right hemidiaph assoc w/ some pleural calcif (no known asbestos exposure) ?granuloma w/o change on serial CXRs; he denies cough, sput, hemoptysis, SOB, edema, etc...    HBP> on Metop50Bid & Amlod10; BP= 130/80 & he is asymptomatic w/o CP, palpit, HA, dizzy, SOB, edema, etc...    Chest pain hx> eval by DrGolding in [place] in [place]> we do not have their records, pt reports that Myoview was OK but prev Myoview 2009 by DrWall showed some HK & EF=47%...    Hx DVT> this occurred in 2007 & treated in [place] by [facility]- off Coumadin now per DrGolding & doing well off this med so far...    Lipids> on Prav40, CoQ10, & Feno160 for TG~300 & diet; FLP 10/14 shows TChol  171, TG 130, HDL 45, LDL 100; numbers improved on meds, now needs to lose wt!    DM> supposed to be on Metformin500-2Bid & Glimep4mg /d; but only taking Metform500/d due to dose dependent itching on 4/d and 2/d per pt hx "it makes me itch so bad"; Labs 4/15 showed BS=136, A1c=7.3 & we decided to STOP the Metform & try Glimep4mg Qam & Januvia100mg Qpm...     GI> on Omep20 as needed & up to date on colon screening...    GU> eval for hematuria by DrBorden; rechecked 12/13 & stable w/ clear urine, PSA=0.86, & felt to be doing well, requests Viagra...    Hx DJD/ Gout> no prob since starting on the Allopurinol 300mg /d, & known Spine dis w/ diffuse spondylosis & osteophtes etc; uses OTC analgesics as needed... We reviewed prob list, meds, xrays and labs> see below for updates >> studies ordered by DrGolding 12/14-2/15>>   CXR 12/14 showed norm heart size, mild tortuosity of Ao, density on right hemidiaph- unchanged from old films, otherw clear, min pleural thickening...  PFT done 3/15 at [facility]:  FVC=3.06 (70%), FEV1=2.05 (62%) & 9% improvement after bronchodil, %1sec=67, mid-flows=35% predicted; LungVolumes showed VC=61%, RV=115%, w/ RV/TLC=54 (149%predicted); DLCO reduced at 55% but was wnl after correction for VA... This is  c/w mild airflow obstruction w/ superimposed mod restriction w/ air trapping...   ABGs on RA showed pH=7.45, pCO2=34, pO2=102  EKG & Myoview done by DrStClair in [place] & results not avail in epic....  I-131 Thyroid Uptake & Scan done 3/15 instead of a thyroid ultrasound> 24h uptake sl low at 7%, homogeneous uptake bilat, no focal abn seen...  CT Chest done 2/15 showed calcified pleural plaque on the right ? prior asbestos exposure, no adenopathy, mult low-attenuation nodules in right lobe of thyroid up to 1.1cm, multilevel spondylosis in Tspine (& a lucent lesion in T2 ?etiology), and "nothing to explain pt's ant CP"...   ~  January 15, 2014:  64mo ROV & Hector Rose was diagnosed &  treated for a recurrent DVT 10/15- he presented to DrFusco & Golding's office w/ pain & swelling in his right leg for several days, no CP, palpit, cough, hemoptysis, or SOB;  Prev dx w/ DVT in 2007 & on Coumadin for many yrs, f/u VenDopplers were neg & Coumadin was stopped in 2014;  He did well for >25yr until this episode, no known ppt event & no known hypercoag state;  This time he has been started on Xarelto15Bid & he has f/u appt w/ DrGolding et al soon for recheck & switch to Xarelto20/d...     BP is controlled on Metop50Bid & Amlod10; Hx angioedema from ACE; BP= 110/60 range & he denies CP, palpit, dizzy, SOB, edema, etc...    Lipids are controlled on Prav40 & off prev Feno160; FLP today shows TChol179, TG 153, HDL 34, LDL 115... REC same med, better low fat diet, get wt down & incr exercise...    DM treated w/ Glimep4 & Januvia100; he is INTOL to Metformin w/ pruritis; Labs today showed BS=131, A1c= 8.9; weight is stable at 222# w/ BMI=30; additional meds are expensive & he doesn't want insulin, only option is to lose wt!    Thyroid nodules> several right thyroid nodules seen on CT Chest & he was sent to [place], Endocrine for eval; he reports bx that was neg, we do not have notes from her, he continues thyroid & I suggested DM f/u for Endocrine as well.      He has several Ortho complaints and is seeing DrGioffre for right hip pain; on Naprosyn & sched for injection soon... We reviewed prob list, meds, xrays and labs> see below for updates >>   Ven Dopplers 12/2013 showed extensive DVT in right leg...   LABS 11/15:  FLP ok x HYDL=34 LDL=115;  Chems- ok x BS=131 A1c=8.9;  CBC- wnl;  TSH=0.87;  PSA=1.19... PLAN>> he has f/u [facility] in [place] for Xarelto & DVT; he has mult specialists tending his needs; continue same meds- he needs better DM control but doesn't want insulin and cost is a factor for additional meds; I am retiring from the Primary Care side of my practice & have suggested  f/u by [place] .. I will see him back in 101mo for Pulm f/u w/ CXR...  ~  Jul 16, 2014:  24mo ROV & Hector Rose is here for a pulmonary follow up visit> DrFusco & Hilma Favors are doing his Primary Care & he tells me he is seeing DrNida, Endocrine for his DM (started on a basal insulin) & multinod goiter...  Hector Rose notes that his breathing is good and he denies cough, sput, hemoptysis, SOB, CP, etc; he only c/o allergies w/ some clear drainage; he exercises w/ walking & machines at the [employer]  4-5d/wk & he feels that his stamina is OK...       Right basilar granuloma and pleural thickening> last CXR was 12/14 showed norm heart size, mild tortuosity of Ao, density on right hemidiaph- unchanged from old films, otherw clear, min pleural thickening.      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 We reviewed prob list, meds, xrays and labs> see below for updates >>   CXR 5/16 showed norm heart size, stable nodule overlying the dome of the right hemidiaph- no change, DJD Tspine... PLAN>>  Crash is encouraged to continue diet + exercise; he will maintain Primary Care f/u w/ DrGolding & Endocrine via [facility]; he will call for any breathing problems and plan routine f/u 24yr...   ~  Jul 21, 2015:  1year ROV & Hector Rose is doing well- no new complaints or concerns;  He is followed by PCP- Drgolding & Julio Sicks- Endocrine regularly & last note 06/14/15 is reviewed; HBP, HL, DM on Novolin 70/30 injecting 40u Qam and 25u w/ supper if BS>90;  Wt is hovering ~220# w/ BMI 29-30 range;  BS=120-140 but A1c was 8.6;  He has prob neuropathy w/ foot burning & started on Gabapentin via ER 03/2015;  He also has a mixed hyperlipidemia w/ TG ~250 on diet & FishOil (intol to all statins...   From the pulmonary standpoint he notes his breathing is good- no issues; denies cough, sput, hemoptysis, SOB/DOE & CP;  He is active & exercises by walking  4-5d/wk;  Notes few allergy symptoms & uses OTC antihist + Flonase prn...    Hector Rose saw DrStark for GI 06/09/15>  Referred by DrGolding for dysphagia, epic note reviewed, last colon 2008 was neg x hems; they did Ba esophagram and EGD EXAM shows Afeb, VSS, O2sat=99% on RA;  HEENT- neg, mallampati1;  Chest- clear w/o w/r/r;  Heart- RR w/o m/r/g;  Abd- soft, nontender, neg;  Ext- neg w/o c/c/e;  Neuro- no focal deficits, he has intermit burning c/w DM neuropathy  CXR 07/21/15 showed norm heart size, mildly prom interstitial markings, partially calcif nodule at right lung base is stable/ unchanged, there is calcif of the ant longitudinal ligament IMP/PLAN>>  Stable from the pulmonary standpoint; we still rec diet/ exerciise/ wt reduction; he will call for any breathing problems...           PROBLEM LIST:    PULMONARY NODULE (ICD-518.89) - vague nodular opacity right base over diaphragm on old films... COMBINED MILD OBSTRUCTIVE & RESTRICTIVE LUNG DISEASE >>  ~  1.7 cm benign ?granuloma... no change on serial CXR or CT's back to 2000. ~  CXR 2/10 is WNL- nodule not seen... ~  CXR 9/10 showed tort Ao, Cor WNL, lungs clear w/o nodule identified. ~  CXR 9/11 showed clear lungs, NAD.Marland Kitchen. ~  CXR 9/12 showed clear lungs, NAD.Marland Kitchen. ~  CXR 10/14 showed stable heart size, clear lungs x right base scarring, mild degen changes in Tspine, NAD.Marland Kitchen. ~  CXR 12/14 showed norm heart size, mild tortuosity of Ao, density on right hemidiaph- unchanged from old films, otherw clear, min pleural thickening... ~  CT Chest 2/15 showed calcified pleural plaque on the right ? prior asbestos exposure, no adenopathy, mult low-attenuation nodules in right lobe of thyroid up to 1.1cm, multilevel spondylosis in Tspine (& a lucent lesion in T2 ?etiology), and "nothing to explain pt's ant CP"...  ~  PFT done 3/15 at  [facility]:  FVC=3.06 (70%), FEV1=2.05 (62%) & 9% improvement after bronchodil, %1sec=67, mid-flows=35% predicted; LungVolumes  showed VC=61%, RV=115%, w/ RV/TLC=54 (149%predicted); DLCO reduced at 55% but was wnl after correction for VA... This is c/w mild airflow obstruction w/ superimposed mod restriction w/ air trapping...  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 ~  5/16:  CXR showed norm heart size, stable nodule overlying the dome of the right hemidiaph- no change, DJD Tspine... ~  2016>  He notes breathing is good- denies cough, sput, hemoptysis, SOB, etc; [facility]; he does not want inhalers etc & rec to continue exercise program...   He is followed by Hector Rose in [place] for Primary Care >>    HYPERTENSION (ICD-401.9) - controlled on TOPROL 50mg Bid and NORVASC 10mg /d... BP's at home in the 120-130/70 range, and measures 120/78 today- he denies HA, fatigue, visual changes, CP, palipit, dizziness, syncope, dyspnea, edema, etc. ~  he had a neg Cardiolite study in 10/04- no ischemia and EF=52%. ~  Aug09: eval DrWall for atyp CP & risk factors> Myoview 11/01/07 showed no evid for infarct, +diaph attenuation, EF sl reduced at 47%. ~  EKG 1/13 showed NSR, rate67, WNL, NAD... ~  4/14: on Metop50Bid & Amlod10; BP= 108/72 & he is asymptomatic w/o CP, palpit, HA, dizzy, SOB, edema, etc. ~  10/14: on Metop50Bid & Amlod10; BP= 124/70 & he remains essentially asymptomatic... ~  4/15: on Metop50Bid & Amlod10; BP= 130/80 & he is asymptomatic w/o CP, palpit, HA, dizzy, SOB, edema, etc. ~  11/15: on Metform50Bid * Amlod10; VP= 110/60 & he remains asymptomatic...  DEEP VENOUS THROMBOPHLEBITIS (ICD-453.40) - Dx'd by LMD in [place], [place] in 3/07... [facility] and had pain/ swelling L calf... ultrasound at [facility] + for DVT & Rx COUMADIN by DrGolding (protimes q month)- no recent problems, he will check w/ DrGolding about discontinuing the Coumadin. ~  3/12:   Pt remains on Coumadin 42yrs after his single episode unprovoked DVT left leg, followed by DrGolding... ~  3/13:  Pt remains on Coumadin & I have suggested that he discuss coming off this med w/ his LMD, DrGolding... ~  4/14:  DrGolding has stopped his Coumadin in the interval- doing well so far w/o recurrent DVT... ~  4/15: he remains off Coumadin & doing well, w/o recurrent DVT... ~  10/15: he presented to DrFusco&Golding w/ right leg swelling x1wk; VenDopplers were pos for extensive DVT; no obvious ppt event & no known hypercoag state; started on Xarelto & followed in [place]...  HYPERCHOLESTEROLEMIA (ICD-272.0) - controlled on SIMVASTATIN 40mg /d & FISH OIL...  ~  Hector Rose 9/08 showed TChol 148, TG 200, HDL 30, LDL 78... on Advicor 500-20 at that time- keep same. ~  [place] 3/09 showed TChol 214, TG 236, HDL 44, LDL 114... on Advicor- rec change to Simvastatin40. ~  FLP 7/09 showed TChol 129, Tg 188, HDL 35, LDL 57... rec- keep same. ~  FLP 1/10 showed TChol 142, TG 165, HDL 35, LDL 74... stable on Simva40. ~  FLP 3/11 showed TChol 127, TG 147, HDL 34, LDL 64 ~  FLP 9/11 showed TChol 143, TG 151, HDL 36, LDL 77 ~  FLP 3/12 on Simva40 showed TChol 161, TG 103, HDL 43, LDL 98 ~  FLP 9/12 on Simva40 showed  TChol 196, TG 119, HDL 49, LDL 123... Same med, better diet effort. ~  FLP 3/13 off Simva showed TChol 196, TG 303, HDL 45, LDL 85... Needs better low fat diet & wt reduction. ~  FLP 9/13 on diet alone showed TChol 175, TG 304, HDL 38, LDL 70... rec to add FENOFIBRATE 160mg /d... ~  [place] 3/14 on Feno160 showed TChol 214, TG 143, HDL 44, LDL 148... rec to add PRAVASTATIN40 ~  FLP 10/14 on Prav40+Feno160 showed TChol 171, TG 130, HDL 45, LDL 100 => he stopped the Feno in the interval... ~  FLP 11/15 on Prav40 showed TChol179, TG 153, HDL 34, LDL 115... REC same med, better low fat diet, get wt down & incr exercise.   He is followed for Endocrinology by DrNIDA in [place] >>   DM (ICD-250.00) >>  ~   on METFORMIN 500mg Bid & GLIMEPIRIDE 1mg /d (NOTE: hx prev hypoglycemia from Glucovance). ~  labs 9/08 showed FBS=106 and HgA1C=6.5.Marland KitchenMarland Kitchen on Glucovance 1/2 Bid. ~  labs 3/09 showed BS= 99, HgA1c= 7.0.Marland KitchenMarland Kitchen on Glucovance 1/2 Qam only- rec to incr to Bid again. ~  labs 7/09 showed BS= 104, HgA1c= 6.8.Marland KitchenMarland Kitchen rec- keep same. ~  pt reports sugars too low ~50 at 11AM if he takes even 1/2 tab in AM, therefore he changed to 1/2 at dinner only... ~  labs 1/10 showed BS= 125, A1c= 7.0 ~  labs 9/10 showed BS= 102, A1c= 6.8 ~  labs 3/11 showed BS= 110, A1c= 7.0.Marland KitchenMarland Kitchen continue same meds, get wt down! ~  labs 9/11 showed BS= 129, A1c= 7.7.Marland KitchenMarland Kitchen rec change to METFORMIN ER 500mg  Qam & GLIMEPIRIDE 1mg  Qam. ~  labs 3/12 showed BS= 113, A1c= 9.0.Marland KitchenMarland Kitchen rec to double meds> Metform500Bid, Glim2mg AM (?he never incr the Amaryl) ~  Labs 9/12 showed BS= 120, A1c= 7.2.Marland KitchenMarland Kitchen Keep same (?on Glimep1mg ?) + low carb diet. ~  Labs 3/13 on Metform500Bid+Glim1 showed BS=120, A1c=7.4.Marland KitchenMarland Kitchen Keep same med, better diet, get wt down ~  Labs 9/13 on Metform500Bid+Glim1 showed BS=125, A1c=7.1 ~  Labs 4/14 on Metform500Bid+Glim1 showed BS=113, A1c=8.3.Marland KitchenMarland Kitchen rec to incr Glimep2mg Qam ~  Labs 10/14 on Metform500Bid+Glim2 showed BS=153, A1c=8.6.Marland KitchenMarland Kitchen rec incr Metform500-2Bid & Glim4Qam, + diet & exercise... ~  Labs 4/15 on Metform500/d+Glimep4 (wt down 8# to 224) showed BS=136, A1c=7.3; HE IS INTOL TO METFORM w/ itching; Rec- Glimep4Qam & Januvia100Qpm... ~  Labs 11/15 on Glim4+Januv100 showed BS= 131, A1c= 8.9; he does not want insulin & cost is an issue for him; REC DM f/u w/ Primary[facility] in [place] & Endocrine, DrBalan; in the meanwhile- better diet, exercise, get wt down...  MULTINODULAR GOITER >>  ~  eval & Rx by Norton Pastel...  GERD (ICD-530.81) - takes OMEPRAZOLE 20mg  Prn for reflux symptoms... ~  neg colonoscopy by DrStark in 6/00, and again 8/08 ( x sm hem's)... f/u planned 10 yrs. ~  Abd Sonar 11/12 showed Fatty Liver, mild GB sludge w/o stones,  overlying bowel gas...  BENIGN PROSTATIC HYPERTROPHY, WITH OBSTRUCTION (ICD-600.01) - he sees DrDavis yearly- pt reports "PSA was good" (we don't have notes from him)... S/P open prostatectomy 1999 by [id number]... Hx prostatitis in past... uses Cialis for ED. ~  labs 3/11 showed BUN= 10, Crear= 1.2... PSA done by Urology. ~  labs 9/11 showed BUN= 11, Creat= 1.3, PSA= 1.13 ~  he had f/u Urology DrBorden- BPH/ LUTS, PSA screening- "it was good" per pt. ~  Labs 9/12 showed PSA= 1.23 ~  Labs 9/13 showed PSA= 0.84 ~  12/13: he had f/u  Urology DrBorden> BPH w/ prev prostatectomy 1999, Hx hematuria while on coumadin & abn urine cytology w/ neg cysto; yearly f/u doing well... ~  10/14:  Labs here showed PSA= 0.86 ~  Labs 11/15 showed PSA= 1.19  RENAL CALCULUS (ICD-592.0) HEMATURIA >> on Coumadin per DrGolding in [place] & eval by DrBorden, Urology... ~  adm 2/13 by Urology DrBorden for hematuria & abn Ucytology (on Coumadin per DrGolding) w/ prev neg CT Abd & office cysto; hosp cysto (w/ bladder bxs off Coumadin- all neg), retrograde pyelogram (neg), he will continue to follow... ~  No further hematuria & UA in office all neg w/o blood; he continues to f/u w/ DrBorden...  DEGENERATIVE JOINT DISEASE (ICD-715.90) - hx of pain on his right side and right elbow... prev Rx w/ Etodolac, Parafon, heat and elbow pad... refer to ortho if symptoms persist... now he uses OTC anti-inflamm Rx Prn.  GOUT (ICD-274.9) - states he had episode right elbow arthritis and gout Rx'd by DrSypher 4/09 w/ Colchicine & Allopurinol 100mg /d... ~  labs 7/09 show Uric 6.1.Marland KitchenMarland Kitchen OK on Allopurinol 100mg /d... ~  labs 1/10 showed Uric= 6.5 ~  9/11:  pt reports that he stopped Allopurinol earlier this yr & had recent gout attack per DrGolding Rx w/ Probenecid... he would like to restart the Allopurinol preventive Rx & I agree> ALLOPURINOL 300mg /d written. ~  No further gout attacks on the Allopurinol 300mg /d...  CARPAL TUNNEL SYNDROME  (ICD-354.0) - treated by DrSypher over the years for CTS and mult trigger fingers (w/ surg)...  KELOID (ICD-701.4)  Health Maintenance - pt had "bug bite" on leg 9/10 w/ Tetanus shot by DrGolding in [place]... prev PNEUMOVAX 2000 at age 14, therefore given f/u PNEUMOVAX in 2010 at age 62... he gets yearly flu vaccine each fall.   Past Surgical History  Procedure Laterality Date  . Prostatectomy  1999    Dr. Rosana Hoes  . Cosmetic surgery  2000    Keloid injections  . Ortho surgery on fingers  2005 & 2008    Dr. Lorelle Formosa finger release  . Cystoscopy with biopsy  04/14/2011    Procedure: CYSTOSCOPY WITH BIOPSY;  Surgeon: Dutch Gray, MD;  Location: [facility];  Service: Urology;  Laterality: N/A;  Bladder Biopsies   . Cystoscopy/retrograde/ureteroscopy  04/14/2011    Procedure: CYSTOSCOPY/RETROGRADE/URETEROSCOPY;  Surgeon: Dutch Gray, MD;  Location: [facility];  Service: Urology;  Laterality: Bilateral;  (Bil) RPG   . Cataract surgery  12/2013    Outpatient Encounter Prescriptions as of 07/21/2015  Medication Sig  . acetaminophen (TYLENOL) 500 MG tablet Take 1,000 mg by mouth every 6 (six) hours as needed for moderate pain.  Marland Kitchen allopurinol (ZYLOPRIM) 300 MG tablet TAKE 1 TABLET BY MOUTH ONCE DAILY  . amLODipine (NORVASC) 10 MG tablet TAKE 1 TABLET EVERY MORNING  . B-D INS SYRINGE 0.5CC/31GX5/16 31G X 5/16" 0.5 ML MISC USE TWICE A DAY AS DIRECTED  . fluticasone (FLONASE) 50 MCG/ACT nasal spray Place 1 spray into both nostrils daily as needed for allergies or rhinitis.  Marland Kitchen gabapentin (NEURONTIN) 300 MG capsule Take 1 capsule (300 mg total) by mouth 3 (three) times daily. (Patient taking differently: Take 300 mg by mouth 2 (two) times daily. )  . glucose blood (ONE TOUCH ULTRA TEST) test strip Use as directed 2 x daily. E11.65  . Insulin NPH Isophane & Regular (NOVOLIN 70/30 [place]) Inject 25-40 Units into the skin 2 (two) times daily before a meal. 40 units with breakfast and 25 units with supper  if  blood glucose is above 90  . metoprolol (LOPRESSOR) 50 MG tablet TAKE 1 TABLET TWICE DAILY  . Multiple Vitamin (MULITIVITAMIN WITH MINERALS) TABS Take 1 tablet by mouth daily. Mens One a Day.  . Omega-3 Fatty Acids (FISH OIL PO) Take 1 capsule by mouth daily.  Marland Kitchen omeprazole (PRILOSEC) 20 MG capsule TAKE ONE CAPSULE BY MOUTH ONCE DAILY AS NEEDED FOR STOMACH ACID  . ONETOUCH DELICA LANCETS 99991111 MISC Use as directed 2 x daily. E11.65  . rivaroxaban (XARELTO) 20 MG TABS tablet Take 20 mg by mouth daily with supper.  . vitamin B-12 (CYANOCOBALAMIN) 500 MCG tablet Take 500 mcg by mouth daily.  . Rivaroxaban (XARELTO) 15 MG TABS tablet Take 1 tablet (15 mg total) by mouth 2 (two) times daily. (Patient not taking: Reported on 07/21/2015)   No facility-administered encounter medications on file as of 07/21/2015.    Allergies  Allergen Reactions  . Ace Inhibitors Swelling    REACTION: angio edema (swelling of lips)  . Metformin And Related     itching    Current Medications, Allergies, Past Medical History, Past Surgical History, Family History, and Social History were reviewed in [employer] record.    Review of Systems        See HPI - all other systems neg except as noted...   The patient complains of dyspnea on exertion.  The patient denies anorexia, fever, weight loss, weight gain, vision loss, decreased hearing, hoarseness, chest pain, syncope, peripheral edema, prolonged cough, headaches, hemoptysis, abdominal pain, melena, hematochezia, severe indigestion/heartburn, hematuria, incontinence, muscle weakness, suspicious skin lesions, transient blindness, difficulty walking, depression, unusual weight change, abnormal bleeding, enlarged lymph nodes, and angioedema.     Objective:   Physical Exam     WD, WN, overwt 75 y/o BM in NAD...  GENERAL:  Alert & oriented; pleasant & cooperative... HEENT:  [place]/AT, EOM-wnl, PERRLA, EACs-wax, TMs-wnl, NOSE-clear, THROAT-clear &  wnl, no angioedema. NECK:  Supple w/ full ROM; no JVD; normal carotid impulses w/o bruits; no thyromegaly or nodules palpated; no lymphadenopathy. CHEST:  Clear to P & A; without wheezes/ rales/ or rhonchi. HEART:  Regular Rhythm; without murmurs/ rubs/ or gallops. ABDOMEN:  Soft & nontender; normal bowel sounds; no organomegaly or masses detected. RECTAL:  prostate 3+, no nodules, stool heme neg... EXT:  without deformities, mild arthritic changes; no varicose veins/ venous insuffic/ or edema. NEURO:  CN's intact; motor testing normal; sensory testing normal; gait normal & balance OK. DERM:  Keloids noted...  RADIOLOGY DATA:  Reviewed in the EPIC EMR & discussed w/ the patient...  LABORATORY DATA:  Reviewed in the EPIC EMR & discussed w/ the patient...   Assessment & Plan:    Pulm Nodule, min pleural plaque w/o known asbestos exposure, mild obstructive & mod restrictive defects>  Known lesion & calcif plaque right base above diaph seen on old CTs but not readily visible on plain films; he has no real resp symptoms & we are following...   Medical problems are now managed by DrGolding & DrNida in [place] >>  HBP>  Controlled on BBlocker, CCB; tol well, continue same... Hx DVT>  hx single episode left leg DVT in 2007- Dx in [place] & followed there; Coumadin stopped 2014 & now w/ new right leg DVT on nXarelto... CHOL>  On Prav40 + off Feno; FLP 11/15 was fair, now work on diet & get wt down! DM>  He reports INTOL to Metform w/ itching; onGlimep4mg Qam & Januvia100mg Qpm but control  is worse; he doesn't want insulin & additional meds are $$; REC diet, exercise, wt reduction & consult w/ Endocrine DrBalan... Multinodular Goiter> eval & rx from [place] in [place]. GI> GERD> on PPI as needed; had colon 2008, neg & f/u due 2018... GU> BPH w/ open prostatectomy 1999 by DrDavis;  PSA remains WNL; he has remote hx kidney stone; prob of Hematuria & ?abn cytology in office; Cysto, retrogrades &  bladder bxs were all neg 2/13 per DrBorden & he continues to follow... DJD/ Gout/ Hx CTS>  Stable on OTC anti-inflamm as needed and Allopurinol daily... Keloid>  Aware, no change...   Patient's Medications  New Prescriptions   No medications on file  Previous Medications   ACETAMINOPHEN (TYLENOL) 500 MG TABLET    Take 1,000 mg by mouth every 6 (six) hours as needed for moderate pain.   ALLOPURINOL (ZYLOPRIM) 300 MG TABLET    TAKE 1 TABLET BY MOUTH ONCE DAILY   AMLODIPINE (NORVASC) 10 MG TABLET    TAKE 1 TABLET EVERY MORNING   B-D INS SYRINGE 0.5CC/31GX5/16 31G X 5/16" 0.5 ML MISC    USE TWICE A DAY AS DIRECTED   FLUTICASONE (FLONASE) 50 MCG/ACT NASAL SPRAY    Place 1 spray into both nostrils daily as needed for allergies or rhinitis.   GABAPENTIN (NEURONTIN) 300 MG CAPSULE    Take 1 capsule (300 mg total) by mouth 3 (three) times daily.   GLUCOSE BLOOD (ONE TOUCH ULTRA TEST) TEST STRIP    Use as directed 2 x daily. E11.65   INSULIN NPH ISOPHANE & REGULAR (NOVOLIN 70/30 [place])    Inject 25-40 Units into the skin 2 (two) times daily before a meal. 40 units with breakfast and 25 units with supper if blood glucose is above 90   METOPROLOL (LOPRESSOR) 50 MG TABLET    TAKE 1 TABLET TWICE DAILY   MULTIPLE VITAMIN (MULITIVITAMIN WITH MINERALS) TABS    Take 1 tablet by mouth daily. Mens One a Day.   OMEGA-3 FATTY ACIDS (FISH OIL PO)    Take 1 capsule by mouth daily.   OMEPRAZOLE (PRILOSEC) 20 MG CAPSULE    TAKE ONE CAPSULE BY MOUTH ONCE DAILY AS NEEDED FOR STOMACH ACID   ONETOUCH DELICA LANCETS 99991111 MISC    Use as directed 2 x daily. E11.65   RIVAROXABAN (XARELTO) 15 MG TABS TABLET    Take 1 tablet (15 mg total) by mouth 2 (two) times daily.   RIVAROXABAN (XARELTO) 20 MG TABS TABLET    Take 20 mg by mouth daily with supper.   VITAMIN B-12 (CYANOCOBALAMIN) 500 MCG TABLET    Take 500 mcg by mouth daily.  Modified Medications   No medications on file  Discontinued Medications   No medications on file

## 2015-07-27 NOTE — Progress Notes (Signed)
Quick Note:  LVM for pt to return call ______ 

## 2015-07-28 ENCOUNTER — Telehealth: Payer: Self-pay | Admitting: Pulmonary Disease

## 2015-07-28 NOTE — Telephone Encounter (Signed)
Result Notes     Notes Recorded by Osa Craver, CMA on 07/27/2015 at 11:54 AM LVM for pt to return call. ------  Notes Recorded by Noralee Space, MD on 07/21/2015 at 3:30 PM Please notify patient> It all looks good... CXR is stable- clear lungs, NAD; the partially calcif RLL nodule is unchanged and we will follow...   Spoke with pt. He is aware of results. Nothing further was needed.

## 2015-07-30 NOTE — Progress Notes (Signed)
Quick Note:  Called spoke with patient, advised of cxr results / recs as stated by SN. Pt verbalized his understanding and denied any questions. ______

## 2015-09-09 ENCOUNTER — Other Ambulatory Visit: Payer: Self-pay | Admitting: "Endocrinology

## 2015-09-09 DIAGNOSIS — N182 Chronic kidney disease, stage 2 (mild): Secondary | ICD-10-CM | POA: Diagnosis not present

## 2015-09-09 DIAGNOSIS — Z794 Long term (current) use of insulin: Secondary | ICD-10-CM | POA: Diagnosis not present

## 2015-09-09 DIAGNOSIS — E1122 Type 2 diabetes mellitus with diabetic chronic kidney disease: Secondary | ICD-10-CM | POA: Diagnosis not present

## 2015-09-09 LAB — BASIC METABOLIC PANEL
BUN: 13 mg/dL (ref 7–25)
CALCIUM: 9.2 mg/dL (ref 8.6–10.3)
CO2: 30 mmol/L (ref 20–31)
CREATININE: 1.42 mg/dL — AB (ref 0.70–1.18)
Chloride: 102 mmol/L (ref 98–110)
GLUCOSE: 161 mg/dL — AB (ref 65–99)
Potassium: 4.6 mmol/L (ref 3.5–5.3)
SODIUM: 139 mmol/L (ref 135–146)

## 2015-09-10 LAB — HEMOGLOBIN A1C
Hgb A1c MFr Bld: 7.6 % — ABNORMAL HIGH (ref <5.7)
Mean Plasma Glucose: 171 mg/dL

## 2015-09-17 ENCOUNTER — Encounter: Payer: Self-pay | Admitting: "Endocrinology

## 2015-09-17 ENCOUNTER — Ambulatory Visit (INDEPENDENT_AMBULATORY_CARE_PROVIDER_SITE_OTHER): Payer: PPO | Admitting: "Endocrinology

## 2015-09-17 VITALS — BP 122/68 | HR 64 | Ht 73.0 in | Wt 233.0 lb

## 2015-09-17 DIAGNOSIS — Z794 Long term (current) use of insulin: Secondary | ICD-10-CM | POA: Diagnosis not present

## 2015-09-17 DIAGNOSIS — E1122 Type 2 diabetes mellitus with diabetic chronic kidney disease: Secondary | ICD-10-CM | POA: Diagnosis not present

## 2015-09-17 DIAGNOSIS — I10 Essential (primary) hypertension: Secondary | ICD-10-CM

## 2015-09-17 DIAGNOSIS — N182 Chronic kidney disease, stage 2 (mild): Secondary | ICD-10-CM | POA: Diagnosis not present

## 2015-09-17 DIAGNOSIS — E785 Hyperlipidemia, unspecified: Secondary | ICD-10-CM | POA: Diagnosis not present

## 2015-09-17 NOTE — Patient Instructions (Signed)

## 2015-09-17 NOTE — Progress Notes (Signed)
Subjective:    Patient ID: Hector Rose, male    DOB: 11-24-1940,    Past Medical History  Diagnosis Date  . Pulmonary nodule 04-11-11    "PT. NOT AWARE" -denies problems with breathing  . Hypertension   . DVT femoral (deep venous thrombosis) with thrombophlebitis ([place]) 04-11-11    ?'09-tx. warfarin, pt being bridge with Lovenox 30mg  x2daily,x 5days  . Hypercholesteremia   . Hemorrhoids   . BPH (benign prostatic hypertrophy) with urinary obstruction   . Renal calculus   . DJD (degenerative joint disease)   . Gout 04-11-11     ankle 2 yrs ago  . Carpal tunnel syndrome   . Keloid 04-11-11    multiple-arms,back, chest  . Difficult intubation 04-11-11    08-11-97 some issues with intubation/record with chart  . GERD (gastroesophageal reflux disease) 04-11-11    mild, no med in 1 month  . DM (diabetes mellitus) ([place]) 04-11-11    dx. 8-10 yrs. -oral meds ,cbg's(120-150)  . Asthma 04-11-11    AS CHILD ONLY  . Thyroid disease     hypothyroid  . Internal hemorrhoids    Past Surgical History  Procedure Laterality Date  . Prostatectomy  1999    Dr. Rosana Hoes  . Cosmetic surgery  2000    Keloid injections  . Ortho surgery on fingers  2005 & 2008    Dr. Lorelle Formosa finger release  . Cystoscopy with biopsy  04/14/2011    Procedure: CYSTOSCOPY WITH BIOPSY;  Surgeon: Dutch Gray, MD;  Location: [facility];  Service: Urology;  Laterality: N/A;  Bladder Biopsies   . Cystoscopy/retrograde/ureteroscopy  04/14/2011    Procedure: CYSTOSCOPY/RETROGRADE/URETEROSCOPY;  Surgeon: Dutch Gray, MD;  Location: [facility];  Service: Urology;  Laterality: Bilateral;  (Bil) RPG   . Cataract surgery  12/2013   Social History   Social History  . Marital Status: Married    Spouse Name: N/A  . Number of Children: 2  . Years of Education: N/A   Occupational History  . retired from [employer]  . Smoking status: Never Smoker   . Smokeless tobacco: Never Used  . Alcohol Use: No  .  Drug Use: No  . Sexual Activity: Yes   Other Topics Concern  . None   Social History Narrative   Outpatient Encounter Prescriptions as of 09/17/2015  Medication Sig  . acetaminophen (TYLENOL) 500 MG tablet Take 1,000 mg by mouth every 6 (six) hours as needed for moderate pain.  Marland Kitchen allopurinol (ZYLOPRIM) 300 MG tablet TAKE 1 TABLET BY MOUTH ONCE DAILY  . amLODipine (NORVASC) 10 MG tablet TAKE 1 TABLET EVERY MORNING  . B-D INS SYRINGE 0.5CC/31GX5/16 31G X 5/16" 0.5 ML MISC USE TWICE A DAY AS DIRECTED  . fluticasone (FLONASE) 50 MCG/ACT nasal spray Place 1 spray into both nostrils daily as needed for allergies or rhinitis.  Marland Kitchen gabapentin (NEURONTIN) 300 MG capsule Take 1 capsule (300 mg total) by mouth 3 (three) times daily. (Patient taking differently: Take 300 mg by mouth 2 (two) times daily. )  . glucose blood (ONE TOUCH ULTRA TEST) test strip Use as directed 2 x daily. E11.65  . Insulin NPH Isophane & Regular (NOVOLIN 70/30 [place]) Inject 25-40 Units into the skin 2 (two) times daily before a meal. 40 units with breakfast and 25 units with supper if blood glucose is above 90  . metoprolol (LOPRESSOR) 50 MG tablet TAKE 1 TABLET TWICE DAILY  . Multiple Vitamin (  MULITIVITAMIN WITH MINERALS) TABS Take 1 tablet by mouth daily. Mens One a Day.  . Omega-3 Fatty Acids (FISH OIL PO) Take 1 capsule by mouth daily.  Marland Kitchen omeprazole (PRILOSEC) 20 MG capsule TAKE ONE CAPSULE BY MOUTH ONCE DAILY AS NEEDED FOR STOMACH ACID  . ONETOUCH DELICA LANCETS 99991111 MISC Use as directed 2 x daily. E11.65  . Rivaroxaban (XARELTO) 15 MG TABS tablet Take 1 tablet (15 mg total) by mouth 2 (two) times daily. (Patient not taking: Reported on 07/21/2015)  . rivaroxaban (XARELTO) 20 MG TABS tablet Take 20 mg by mouth daily with supper.  . vitamin B-12 (CYANOCOBALAMIN) 500 MCG tablet Take 500 mcg by mouth daily.   No facility-administered encounter medications on file as of 09/17/2015.   ALLERGIES: Allergies  Allergen Reactions  .  Ace Inhibitors Swelling    REACTION: angio edema (swelling of lips)  . Metformin And Related     itching   VACCINATION STATUS: Immunization History  Administered Date(s) Administered  . Influenza Split 11/27/2011, 12/30/2012, 01/01/2014  . Influenza Whole 01/27/2009, 11/30/2009, 11/17/2010  . Pneumococcal Polysaccharide-23 12/02/2008    Diabetes He presents for his follow-up diabetic visit. He has type 2 diabetes mellitus. Onset time: Diagnosed approximately  at age 76. His disease course has been improving. Pertinent negatives for hypoglycemia include no confusion, headaches, pallor or seizures. Pertinent negatives for diabetes include no chest pain, no fatigue, no polydipsia, no polyphagia, no polyuria and no weakness. Symptoms are improving. Risk factors for coronary artery disease include diabetes mellitus, dyslipidemia, hypertension, male sex and sedentary lifestyle. Current diabetic treatment includes insulin injections. His weight is increasing steadily. He is following a generally unhealthy diet. Meal planning includes ADA exchanges. He participates in exercise intermittently. His breakfast blood glucose range is generally 140-180 mg/dl. His dinner blood glucose range is generally 140-180 mg/dl. His overall blood glucose range is 140-180 mg/dl.  Hypertension This is a chronic problem. The current episode started more than 1 year ago. The problem is controlled. Pertinent negatives include no chest pain, headaches, neck pain, palpitations or shortness of breath. Past treatments include calcium channel blockers.  Hyperlipidemia This is a chronic problem. The current episode started more than 1 year ago. The problem is uncontrolled. Pertinent negatives include no chest pain, myalgias or shortness of breath. Current antihyperlipidemic treatment includes bile acid squestrants. Risk factors for coronary artery disease include dyslipidemia and diabetes mellitus.     Review of Systems   Constitutional: Negative for fatigue and unexpected weight change.  HENT: Negative for dental problem, mouth sores and trouble swallowing.   Eyes: Negative for visual disturbance.  Respiratory: Negative for cough, choking, chest tightness, shortness of breath and wheezing.   Cardiovascular: Negative for chest pain, palpitations and leg swelling.  Gastrointestinal: Negative for nausea, vomiting, abdominal pain, diarrhea, constipation and abdominal distention.  Endocrine: Negative for polydipsia, polyphagia and polyuria.  Genitourinary: Negative for dysuria, urgency, hematuria and flank pain.  Musculoskeletal: Negative for myalgias, back pain, joint swelling, gait problem and neck pain.  Skin: Negative for pallor, rash and wound.  Neurological: Negative for seizures, syncope, weakness, numbness and headaches.  Psychiatric/Behavioral: Negative for suicidal ideas, hallucinations, confusion and dysphoric mood.    Objective:    BP 122/68 mmHg  Pulse 64  Ht 6\' 1"  (1.854 m)  Wt 233 lb (105.688 kg)  BMI 30.75 kg/m2  Wt Readings from Last 3 Encounters:  09/17/15 233 lb (105.688 kg)  07/21/15 229 lb 6 oz (104.044 kg)  06/14/15 223 lb (101.152  kg)    Physical Exam  Constitutional: He is oriented to person, place, and time. He appears well-developed and well-nourished. He is cooperative.  HENT:  Head: Normocephalic and atraumatic.  Eyes: EOM are normal.  Neck: Normal range of motion. Neck supple. No tracheal deviation present. No thyromegaly present.  Cardiovascular: Normal rate and normal heart sounds.  Exam reveals no gallop.   No murmur heard. Pulses:      Carotid pulses are 2+ on the right side.      Radial pulses are 2+ on the right side.       Dorsalis pedis pulses are 2+ on the right side.       Posterior tibial pulses are 2+ on the right side.  Pulmonary/Chest: Breath sounds normal. No respiratory distress. He has no wheezes.  Abdominal: Soft. Bowel sounds are normal. He exhibits  no distension. There is no hepatosplenomegaly. There is no tenderness. There is no guarding and no CVA tenderness.  Musculoskeletal: He exhibits no edema.       Right shoulder: He exhibits no swelling and no deformity.  Lymphadenopathy:       Head (right side): No submental and no submandibular adenopathy present.       Head (left side): No submental and no submandibular adenopathy present.       Right cervical: No superficial cervical adenopathy present.      Left cervical: No superficial cervical adenopathy present.       Right: No supraclavicular adenopathy present.       Left: No supraclavicular adenopathy present.  Neurological: He is alert and oriented to person, place, and time. He has normal strength and normal reflexes. No cranial nerve deficit or sensory deficit. Gait normal.  Skin: Skin is warm and dry. No rash noted. No cyanosis. Nails show no clubbing.  Psychiatric: He has a normal mood and affect. His speech is normal and behavior is normal. Judgment and thought content normal. Cognition and memory are normal.   Recent Results (from the past 2160 hour(s))  Basic metabolic panel     Status: Abnormal   Collection Time: 09/09/15 10:08 AM  Result Value Ref Range   Sodium 139 135 - 146 mmol/L   Potassium 4.6 3.5 - 5.3 mmol/L   Chloride 102 98 - 110 mmol/L   CO2 30 20 - 31 mmol/L   Glucose, Bld 161 (H) 65 - 99 mg/dL   BUN 13 7 - 25 mg/dL   Creat 1.42 (H) 0.70 - 1.18 mg/dL    Comment:   For patients > or = 74 years of age: The upper reference limit for Creatinine is approximately 13% higher for people identified as African-American.      Calcium 9.2 8.6 - 10.3 mg/dL  Hemoglobin A1c     Status: Abnormal   Collection Time: 09/09/15 10:08 AM  Result Value Ref Range   Hgb A1c MFr Bld 7.6 (H) <5.7 %    Comment:   For someone without known diabetes, a hemoglobin A1c value of 6.5% or greater indicates that they may have diabetes and this should be confirmed with a follow-up  test.   For someone with known diabetes, a value <7% indicates that their diabetes is well controlled and a value greater than or equal to 7% indicates suboptimal control. A1c targets should be individualized based on duration of diabetes, age, comorbid conditions, and other considerations.   Currently, no consensus exists for use of hemoglobin A1c for diagnosis of diabetes for children.  Mean Plasma Glucose 171 mg/dL   Lipid Panel     Component Value Date/Time   CHOL 181 03/04/2015 0815   TRIG 250* 03/04/2015 0815   HDL 34* 03/04/2015 0815   CHOLHDL 5.3* 03/04/2015 0815   VLDL 50* 03/04/2015 0815   LDLCALC 97 03/04/2015 0815   LDLDIRECT 147.5 06/10/2012 0930     Assessment & Plan:   1. Type 2 diabetes mellitus with stage 2 chronic kidney disease He came with improved blood glucose profile and his A1c is  7.6% Improving from  8.6%. Patient is advised to stick to a routine mealtimes to eat 3 meals  a day and avoid unnecessary snacks ( to snack only to correct hypoglycemia). Patient is advised to eliminate simple carbs  from their diet including:  cakes, desserts, ice cream, soda (  diet and regular) , sweet tea , candies,  chips,  cookies,  artificial sweeteners,   and "sugar-free" products .  This will help stabilize  blood glucose profile and potentially help patient lose weight. Patient is given detailed personalized glucose monitoring and insulin dosing instructions. Patient is instructed to call back with extremes of blood glucose readings  less than 70 or greater than 300 mg/dl. Patient  is to bring meter and  blood glucose logs during their next visit. I plan to repeat the following labs on subsequent visits.   I advised him to continue Novolin  70/30  40 units with breakfast and continue at 25 units with supper when pre-meal blood glucose is above 90.  2. HYPERCHOLESTEROLEMIA  During his last visit he is LDL was above target at 130 and HDL at 40. Unfortunately Mr.  [place] does not tolerate statins even at low dose of 10 mg of pravastatin. He is advised to continue with omega-3 fatty acids ,avoid butter and fried food and exercise regularly.  3. Essential hypertension  Blood pressure today  is controlled to 122/68 improving from  142/90. Patient is advised to continue amlodipine.   Follow up plan: Return in about 3 months (around 12/18/2015) for follow up with pre-visit labs, meter, and logs.  Glade Lloyd, MD Phone: [phone]  Fax: [phone]   09/17/2015, 1:53 PM

## 2015-09-25 DIAGNOSIS — M25531 Pain in right wrist: Secondary | ICD-10-CM | POA: Diagnosis not present

## 2015-09-25 DIAGNOSIS — M654 Radial styloid tenosynovitis [de Quervain]: Secondary | ICD-10-CM | POA: Diagnosis not present

## 2015-09-29 DIAGNOSIS — Z6830 Body mass index (BMI) 30.0-30.9, adult: Secondary | ICD-10-CM | POA: Diagnosis not present

## 2015-09-29 DIAGNOSIS — Z1389 Encounter for screening for other disorder: Secondary | ICD-10-CM | POA: Diagnosis not present

## 2015-09-29 DIAGNOSIS — I82501 Chronic embolism and thrombosis of unspecified deep veins of right lower extremity: Secondary | ICD-10-CM | POA: Diagnosis not present

## 2015-10-19 ENCOUNTER — Ambulatory Visit (HOSPITAL_COMMUNITY): Payer: PPO | Attending: Family Medicine

## 2015-10-19 ENCOUNTER — Encounter (HOSPITAL_COMMUNITY): Payer: Self-pay

## 2015-10-19 DIAGNOSIS — M25531 Pain in right wrist: Secondary | ICD-10-CM

## 2015-10-19 NOTE — Therapy (Signed)
[place], [place], [address] Phone: [phone]   Fax:  [phone]  Occupational Therapy Evaluation  Patient Details  Name: Courey Mota MRN: [id number] Date of Birth: Jun 29, 1940 Referring Provider: Dr. Verda Cumins  Encounter Date: 10/19/2015      OT End of Session - 10/19/15 0848    Visit Number 1   Number of Visits 1   Authorization Type [employer] $10 co pay   Authorization Time Period before 10th visit   Authorization - Visit Number 1   Authorization - Number of Visits 10   OT Start Time 0815   OT Stop Time 0830   OT Time Calculation (min) 15 min   Activity Tolerance Patient tolerated treatment well   Behavior During Therapy [facility] for tasks assessed/performed      Past Medical History:  Diagnosis Date  . Asthma 04-11-11   AS CHILD ONLY  . BPH (benign prostatic hypertrophy) with urinary obstruction   . Carpal tunnel syndrome   . Difficult intubation 04-11-11   08-11-97 some issues with intubation/record with chart  . DJD (degenerative joint disease)   . DM (diabetes mellitus) ([place]) 04-11-11   dx. 8-10 yrs. -oral meds ,cbg's(120-150)  . DVT femoral (deep venous thrombosis) with thrombophlebitis ([place]) 04-11-11   ?'09-tx. warfarin, pt being bridge with Lovenox 30mg  x2daily,x 5days  . GERD (gastroesophageal reflux disease) 04-11-11   mild, no med in 1 month  . Gout 04-11-11    ankle 2 yrs ago  . Hemorrhoids   . Hypercholesteremia   . Hypertension   . Internal hemorrhoids   . Keloid 04-11-11   multiple-arms,back, chest  . Pulmonary nodule 04-11-11   "PT. NOT AWARE" -denies problems with breathing  . Renal calculus   . Thyroid disease    hypothyroid    Past Surgical History:  Procedure Laterality Date  . cataract surgery  12/2013  . COSMETIC SURGERY  2000   Keloid injections  . CYSTOSCOPY WITH BIOPSY  04/14/2011   Procedure: CYSTOSCOPY WITH BIOPSY;  Surgeon: Dutch Gray, MD;  Location: [facility];  Service: Urology;  Laterality: N/A;  Bladder Biopsies   . CYSTOSCOPY/RETROGRADE/URETEROSCOPY  04/14/2011   Procedure: CYSTOSCOPY/RETROGRADE/URETEROSCOPY;  Surgeon: Dutch Gray, MD;  Location: [facility];  Service: Urology;  Laterality: Bilateral;  (Bil) RPG   . ortho surgery on fingers  2005 & 2008   Dr. Lorelle Formosa finger release  . PROSTATECTOMY  1999   Dr. Rosana Hoes    There were no vitals filed for this visit.      Subjective Assessment - 10/19/15 0838    Subjective  S: I received a shot from the Doctor and since then I've felt great.   Pertinent History Patient is a 75 y/o male S/P De Quervain's Tendonsis which began approximately 5 weeks ago. pt reports no known cause and denies any repetitive wrist movement or motion. pt received a cortizone shot which has helped decreased the pain to zero. Dr. Drema Dallas has referred patient to ocuupational therapy for evaluation and treatment.    Special Tests Finkkelstein Test: Slight pain/discomfort   Patient Stated Goals None   Currently in Pain? No/denies           [facility] OT Assessment - 10/19/15 0841      Assessment   Diagnosis Har[facility] Pho Tendinosisq   Referring Provider Dr. Verda Cumins   Onset Date --  5 weeks ago   Assessment Follow up with Dr. Drema Dallas 10/26/15   Prior  Therapy None     Precautions   Precautions None     Restrictions   Weight Bearing Restrictions No     Balance Screen   Has the patient fallen in the past 6 months No     Home  Environment   Family/patient expects to be discharged to: Private residence     Prior Function   Level of Independence Independent     ADL   ADL comments No difficulties to report.     Mobility   Mobility Status Independent     Written Expression   Dominant Hand Right     Cognition   Overall Cognitive Status Within Functional Limits for tasks assessed     ROM / Strength   AROM / PROM / Strength AROM     Palpation   Palpation comment No visable signs of swelling or  fascial restrictions palpated.     AROM   Overall AROM  Within functional limits for tasks performed   Overall AROM Comments Right Wrist and hand A/ROM                         OT Education - 10/19/15 0845    Education provided Yes   Education Details Pt was given handout for [employer] Tendinosis. Education provided regarding anatomy, cause, and treatment methods including splinting, anti-inflammatory medication, corticosteriod injections, ice for swelling, and refrain from activtiies that cause pain and swelling.   Person(s) Educated Patient   Methods Explanation;Handout   Comprehension Verbalized understanding          OT Short Term Goals - 10/19/15 0851      OT SHORT TERM GOAL #1   Title Patient will be educated on Qe Quervain's Tendonitis including anatomy, cause, and treatment options.   Time 1   Period Days   Status Achieved                  Plan - 10/19/15 0848    Clinical Impression Statement A: Patient is a 75 y/o male S/P De Quervain's Tendonitis which originally caused increased pain and swelling. Since previous visit with Dr. Drema Dallas patient has be experiencing no pain. Pt received a cortizone shot which he states has decreased his pain to zero. No reports of pain or difficulty during OT evaluation. Pt was educated on condition and typical treatment options (see education section). No further OT needs are needed at this time. Education complete.    Rehab Potential Excellent   OT Frequency One time visit   OT Treatment/Interventions Patient/family education   Plan P: One time visit.   Consulted and Agree with Plan of Care Patient      Patient will benefit from skilled therapeutic intervention in order to improve the following deficits and impairments:  Other (comment) (education regarding condition )  Visit Diagnosis: Pain in right wrist - Plan: Ot plan of care cert/re-cert      G-Codes - [id number]    Functional Assessment  Tool Used FOTO score: 98/100 (2% impaired)   Functional Limitation Carrying, moving and handling objects   Carrying, Moving and Handling Objects Current Status [id number]) At least 1 percent but less than 20 percent impaired, limited or restricted   Carrying, Moving and Handling Objects Goal Status [id number]) At least 1 percent but less than 20 percent impaired, limited or restricted   Carrying, Moving and Handling Objects Discharge Status [phone]) At least 1 percent but less than 20 percent impaired, limited  or restricted      Problem List Patient Active Problem List   Diagnosis Date Noted  . Type 2 diabetes mellitus with stage 2 chronic kidney disease ([place]) 01/15/2014  . Chronic idiopathic gout of multiple sites 01/15/2014  . Calcified pleural plaque on chest x-ray 06/30/2013  . Tinea pedis 09/28/2011  . Hematuria 05/19/2011  . HEMORRHOIDS 05/29/2007  . Acute thromboembolism of deep veins of lower extremity ([place]) 03/27/2007  . Pulmonary nodule 03/27/2007  . GERD 03/27/2007  . BPH (benign prostatic hypertrophy) with urinary obstruction 03/27/2007  . KELOID 03/27/2007  . Hyperlipidemia 01/24/2007  . CARPAL TUNNEL SYNDROME 01/24/2007  . Essential hypertension 01/24/2007  . RENAL CALCULUS 01/24/2007  . Osteoarthritis 01/24/2007   Ailene Ravel, OTR/L,CBIS  [phone]  10/19/2015, 8:55 AM  [place] [address] [place], [place], [address] Phone: [phone]   Fax:  [phone]  Name: Sajan Maloof MRN: [id number] Date of Birth: 1941/02/20

## 2015-10-25 ENCOUNTER — Ambulatory Visit (HOSPITAL_COMMUNITY)
Admission: RE | Admit: 2015-10-25 | Discharge: 2015-10-25 | Disposition: A | Discharge status: Home / Self Care | Payer: PPO | Source: Ambulatory Visit | Attending: Internal Medicine | Admitting: Internal Medicine

## 2015-10-25 ENCOUNTER — Other Ambulatory Visit (HOSPITAL_COMMUNITY): Payer: Self-pay | Admitting: Internal Medicine

## 2015-10-25 DIAGNOSIS — R93421 Abnormal radiologic findings on diagnostic imaging of right kidney: Secondary | ICD-10-CM | POA: Diagnosis not present

## 2015-10-25 DIAGNOSIS — E119 Type 2 diabetes mellitus without complications: Secondary | ICD-10-CM | POA: Diagnosis not present

## 2015-10-25 DIAGNOSIS — N201 Calculus of ureter: Secondary | ICD-10-CM

## 2015-10-25 DIAGNOSIS — M549 Dorsalgia, unspecified: Secondary | ICD-10-CM | POA: Insufficient documentation

## 2015-10-25 DIAGNOSIS — R109 Unspecified abdominal pain: Secondary | ICD-10-CM | POA: Diagnosis not present

## 2015-10-25 DIAGNOSIS — Z6830 Body mass index (BMI) 30.0-30.9, adult: Secondary | ICD-10-CM | POA: Diagnosis not present

## 2015-10-25 DIAGNOSIS — J61 Pneumoconiosis due to asbestos and other mineral fibers: Secondary | ICD-10-CM | POA: Insufficient documentation

## 2015-10-25 DIAGNOSIS — N202 Calculus of kidney with calculus of ureter: Secondary | ICD-10-CM | POA: Diagnosis not present

## 2015-10-26 DIAGNOSIS — D49511 Neoplasm of unspecified behavior of right kidney: Secondary | ICD-10-CM | POA: Diagnosis not present

## 2015-11-02 DIAGNOSIS — D49511 Neoplasm of unspecified behavior of right kidney: Secondary | ICD-10-CM | POA: Diagnosis not present

## 2015-11-02 DIAGNOSIS — C641 Malignant neoplasm of right kidney, except renal pelvis: Secondary | ICD-10-CM | POA: Diagnosis not present

## 2015-11-18 DIAGNOSIS — Z1389 Encounter for screening for other disorder: Secondary | ICD-10-CM | POA: Diagnosis not present

## 2015-11-18 DIAGNOSIS — I10 Essential (primary) hypertension: Secondary | ICD-10-CM | POA: Diagnosis not present

## 2015-11-18 DIAGNOSIS — E1140 Type 2 diabetes mellitus with diabetic neuropathy, unspecified: Secondary | ICD-10-CM | POA: Diagnosis not present

## 2015-11-18 DIAGNOSIS — Z6830 Body mass index (BMI) 30.0-30.9, adult: Secondary | ICD-10-CM | POA: Diagnosis not present

## 2015-11-18 DIAGNOSIS — G629 Polyneuropathy, unspecified: Secondary | ICD-10-CM | POA: Diagnosis not present

## 2015-11-30 DIAGNOSIS — C679 Malignant neoplasm of bladder, unspecified: Secondary | ICD-10-CM | POA: Diagnosis not present

## 2015-11-30 DIAGNOSIS — I10 Essential (primary) hypertension: Secondary | ICD-10-CM | POA: Diagnosis not present

## 2015-11-30 DIAGNOSIS — E6609 Other obesity due to excess calories: Secondary | ICD-10-CM | POA: Diagnosis not present

## 2015-11-30 DIAGNOSIS — E782 Mixed hyperlipidemia: Secondary | ICD-10-CM | POA: Diagnosis not present

## 2015-11-30 DIAGNOSIS — E119 Type 2 diabetes mellitus without complications: Secondary | ICD-10-CM | POA: Diagnosis not present

## 2015-11-30 DIAGNOSIS — E1140 Type 2 diabetes mellitus with diabetic neuropathy, unspecified: Secondary | ICD-10-CM | POA: Diagnosis not present

## 2015-11-30 DIAGNOSIS — E785 Hyperlipidemia, unspecified: Secondary | ICD-10-CM | POA: Diagnosis not present

## 2015-11-30 DIAGNOSIS — Z6830 Body mass index (BMI) 30.0-30.9, adult: Secondary | ICD-10-CM | POA: Diagnosis not present

## 2015-12-17 ENCOUNTER — Other Ambulatory Visit: Payer: Self-pay | Admitting: "Endocrinology

## 2015-12-17 DIAGNOSIS — N182 Chronic kidney disease, stage 2 (mild): Secondary | ICD-10-CM | POA: Diagnosis not present

## 2015-12-17 DIAGNOSIS — E1122 Type 2 diabetes mellitus with diabetic chronic kidney disease: Secondary | ICD-10-CM | POA: Diagnosis not present

## 2015-12-17 DIAGNOSIS — Z794 Long term (current) use of insulin: Secondary | ICD-10-CM | POA: Diagnosis not present

## 2015-12-17 LAB — COMPLETE METABOLIC PANEL WITH GFR
ALK PHOS: 59 U/L (ref 40–115)
ALT: 19 U/L (ref 9–46)
AST: 22 U/L (ref 10–35)
Albumin: 3.9 g/dL (ref 3.6–5.1)
BUN: 13 mg/dL (ref 7–25)
CALCIUM: 9.1 mg/dL (ref 8.6–10.3)
CHLORIDE: 103 mmol/L (ref 98–110)
CO2: 32 mmol/L — ABNORMAL HIGH (ref 20–31)
Creat: 1.34 mg/dL — ABNORMAL HIGH (ref 0.70–1.18)
GFR, EST AFRICAN AMERICAN: 60 mL/min (ref >=60)
GFR, EST NON AFRICAN AMERICAN: 52 mL/min — AB (ref >=60)
Glucose, Bld: 127 mg/dL — ABNORMAL HIGH (ref 65–99)
POTASSIUM: 4.4 mmol/L (ref 3.5–5.3)
Sodium: 142 mmol/L (ref 135–146)
Total Bilirubin: 0.7 mg/dL (ref 0.2–1.2)
Total Protein: 6.6 g/dL (ref 6.1–8.1)

## 2015-12-17 LAB — HEMOGLOBIN A1C
HEMOGLOBIN A1C: 7.6 % — AB (ref <5.7)
MEAN PLASMA GLUCOSE: 171 mg/dL

## 2015-12-20 ENCOUNTER — Encounter: Payer: Self-pay | Admitting: "Endocrinology

## 2015-12-20 ENCOUNTER — Ambulatory Visit (INDEPENDENT_AMBULATORY_CARE_PROVIDER_SITE_OTHER): Payer: PPO | Admitting: "Endocrinology

## 2015-12-20 VITALS — BP 118/71 | HR 73 | Ht 73.0 in | Wt 232.0 lb

## 2015-12-20 DIAGNOSIS — I10 Essential (primary) hypertension: Secondary | ICD-10-CM | POA: Diagnosis not present

## 2015-12-20 DIAGNOSIS — Z794 Long term (current) use of insulin: Secondary | ICD-10-CM

## 2015-12-20 DIAGNOSIS — E1122 Type 2 diabetes mellitus with diabetic chronic kidney disease: Secondary | ICD-10-CM

## 2015-12-20 DIAGNOSIS — E782 Mixed hyperlipidemia: Secondary | ICD-10-CM | POA: Diagnosis not present

## 2015-12-20 DIAGNOSIS — N182 Chronic kidney disease, stage 2 (mild): Secondary | ICD-10-CM | POA: Diagnosis not present

## 2015-12-20 NOTE — Progress Notes (Signed)
Subjective:    Patient ID: Hector Rose, male    DOB: 1941/01/15,    Past Medical History:  Diagnosis Date  . Asthma 04-11-11   AS CHILD ONLY  . BPH (benign prostatic hypertrophy) with urinary obstruction   . Carpal tunnel syndrome   . Difficult intubation 04-11-11   08-11-97 some issues with intubation/record with chart  . DJD (degenerative joint disease)   . DM (diabetes mellitus) ([place]) 04-11-11   dx. 8-10 yrs. -oral meds ,cbg's(120-150)  . DVT femoral (deep venous thrombosis) with thrombophlebitis ([place]) 04-11-11   ?'09-tx. warfarin, pt being bridge with Lovenox 30mg  x2daily,x 5days  . GERD (gastroesophageal reflux disease) 04-11-11   mild, no med in 1 month  . Gout 04-11-11    ankle 2 yrs ago  . Hemorrhoids   . Hypercholesteremia   . Hypertension   . Internal hemorrhoids   . Keloid 04-11-11   multiple-arms,back, chest  . Pulmonary nodule 04-11-11   "PT. NOT AWARE" -denies problems with breathing  . Renal calculus   . Thyroid disease    hypothyroid   Past Surgical History:  Procedure Laterality Date  . cataract surgery  12/2013  . COSMETIC SURGERY  2000   Keloid injections  . CYSTOSCOPY WITH BIOPSY  04/14/2011   Procedure: CYSTOSCOPY WITH BIOPSY;  Surgeon: Dutch Gray, MD;  Location: [facility];  Service: Urology;  Laterality: N/A;  Bladder Biopsies   . CYSTOSCOPY/RETROGRADE/URETEROSCOPY  04/14/2011   Procedure: CYSTOSCOPY/RETROGRADE/URETEROSCOPY;  Surgeon: Dutch Gray, MD;  Location: [facility];  Service: Urology;  Laterality: Bilateral;  (Bil) RPG   . ortho surgery on fingers  2005 & 2008   Dr. Lorelle Formosa finger release  . PROSTATECTOMY  1999   Dr. Rosana Hoes   Social History   Social History  . Marital status: Married    Spouse name: N/A  . Number of children: 2  . Years of education: N/A   Occupational History  . retired from [employer]  . Smoking status: Never Smoker  . Smokeless tobacco: Never Used  . Alcohol use No  . Drug use: No  .  Sexual activity: Yes   Other Topics Concern  . Not on file   Social History Narrative  . No narrative on file   Outpatient Encounter Prescriptions as of 12/20/2015  Medication Sig  . acetaminophen (TYLENOL) 500 MG tablet Take 1,000 mg by mouth every 6 (six) hours as needed for moderate pain.  Marland Kitchen allopurinol (ZYLOPRIM) 300 MG tablet TAKE 1 TABLET BY MOUTH ONCE DAILY  . amLODipine (NORVASC) 10 MG tablet TAKE 1 TABLET EVERY MORNING  . b complex vitamins tablet Take 1 tablet by mouth daily.  . B-D INS SYRINGE 0.5CC/31GX5/16 31G X 5/16" 0.5 ML MISC USE TWICE A DAY AS DIRECTED  . diclofenac (VOLTAREN) 75 MG EC tablet Take 75 mg by mouth 2 (two) times daily.  . fluticasone (FLONASE) 50 MCG/ACT nasal spray Place 1 spray into both nostrils daily as needed for allergies or rhinitis.  Marland Kitchen gabapentin (NEURONTIN) 300 MG capsule Take 1 capsule (300 mg total) by mouth 3 (three) times daily. (Patient taking differently: Take 300 mg by mouth 2 (two) times daily. )  . glucose blood (ONE TOUCH ULTRA TEST) test strip Use as directed 2 x daily. E11.65  . Insulin NPH Isophane & Regular (NOVOLIN 70/30 [place]) Inject 20-40 Units into the skin 2 (two) times daily before a meal. 30 units with breakfast and 20 units with supper  if blood glucose is above 90  . metoprolol (LOPRESSOR) 50 MG tablet TAKE 1 TABLET TWICE DAILY  . Multiple Vitamin (MULITIVITAMIN WITH MINERALS) TABS Take 1 tablet by mouth daily. Mens One a Day.  . Omega-3 Fatty Acids (FISH OIL PO) Take 1 capsule by mouth daily.  Marland Kitchen omeprazole (PRILOSEC) 20 MG capsule TAKE ONE CAPSULE BY MOUTH ONCE DAILY AS NEEDED FOR STOMACH ACID  . ONETOUCH DELICA LANCETS 53G MISC Use as directed 2 x daily. E11.65  . pseudoephedrine-acetaminophen (TYLENOL SINUS) 30-500 MG TABS tablet Take 1 tablet by mouth every 4 (four) hours as needed.  . rivaroxaban (XARELTO) 20 MG TABS tablet Take 20 mg by mouth daily with supper.  . tamsulosin (FLOMAX) 0.4 MG CAPS capsule Take 0.4 mg by mouth.   . vitamin B-12 (CYANOCOBALAMIN) 500 MCG tablet Take 500 mcg by mouth daily.  . [DISCONTINUED] Rivaroxaban (XARELTO) 15 MG TABS tablet Take 1 tablet (15 mg total) by mouth 2 (two) times daily. (Patient not taking: Reported on 07/21/2015)   No facility-administered encounter medications on file as of 12/20/2015.    ALLERGIES: Allergies  Allergen Reactions  . Ace Inhibitors Swelling    REACTION: angio edema (swelling of lips)  . Metformin And Related     itching   VACCINATION STATUS: Immunization History  Administered Date(s) Administered  . Influenza Split 11/27/2011, 12/30/2012, 01/01/2014  . Influenza Whole 01/27/2009, 11/30/2009, 11/17/2010  . Pneumococcal Polysaccharide-23 12/02/2008    Diabetes  He presents for his follow-up diabetic visit. He has type 2 diabetes mellitus. Onset time: Diagnosed approximately  at age 26. His disease course has been stable. Pertinent negatives for hypoglycemia include no confusion, headaches, pallor or seizures. Pertinent negatives for diabetes include no chest pain, no fatigue, no polydipsia, no polyphagia, no polyuria and no weakness. Symptoms are stable. Risk factors for coronary artery disease include diabetes mellitus, dyslipidemia, hypertension, male sex and sedentary lifestyle. Current diabetic treatment includes insulin injections. His weight is stable. He is following a generally unhealthy diet. Meal planning includes ADA exchanges. He participates in exercise intermittently. His breakfast blood glucose range is generally 140-180 mg/dl. His dinner blood glucose range is generally 180-200 mg/dl. His overall blood glucose range is 180-200 mg/dl.  Hypertension  This is a chronic problem. The current episode started more than 1 year ago. The problem is controlled. Pertinent negatives include no chest pain, headaches, neck pain, palpitations or shortness of breath. Past treatments include calcium channel blockers.  Hyperlipidemia  This is a chronic  problem. The current episode started more than 1 year ago. The problem is uncontrolled. Pertinent negatives include no chest pain, myalgias or shortness of breath. Current antihyperlipidemic treatment includes bile acid squestrants. Risk factors for coronary artery disease include dyslipidemia and diabetes mellitus.     Review of Systems  Constitutional: Negative for fatigue and unexpected weight change.  HENT: Negative for dental problem, mouth sores and trouble swallowing.   Eyes: Negative for visual disturbance.  Respiratory: Negative for cough, choking, chest tightness, shortness of breath and wheezing.   Cardiovascular: Negative for chest pain, palpitations and leg swelling.  Gastrointestinal: Negative for abdominal distention, abdominal pain, constipation, diarrhea, nausea and vomiting.  Endocrine: Negative for polydipsia, polyphagia and polyuria.  Genitourinary: Negative for dysuria, flank pain, hematuria and urgency.  Musculoskeletal: Negative for back pain, gait problem, joint swelling, myalgias and neck pain.  Skin: Negative for pallor, rash and wound.  Neurological: Negative for seizures, syncope, weakness, numbness and headaches.  Psychiatric/Behavioral: Negative for confusion, dysphoric mood,  hallucinations and suicidal ideas.    Objective:    BP 118/71   Pulse 73   Ht 6\' 1"  (1.854 m)   Wt 232 lb (105.2 kg)   BMI 30.61 kg/m   Wt Readings from Last 3 Encounters:  12/20/15 232 lb (105.2 kg)  09/17/15 233 lb (105.7 kg)  07/21/15 229 lb 6 oz (104 kg)    Physical Exam  Constitutional: He is oriented to person, place, and time. He appears well-developed and well-nourished. He is cooperative.  HENT:  Head: Normocephalic and atraumatic.  Eyes: EOM are normal.  Neck: Normal range of motion. Neck supple. No tracheal deviation present. No thyromegaly present.  Cardiovascular: Normal rate and normal heart sounds.  Exam reveals no gallop.   No murmur heard. Pulses:       Carotid pulses are 2+ on the right side.      Radial pulses are 2+ on the right side.       Dorsalis pedis pulses are 2+ on the right side.       Posterior tibial pulses are 2+ on the right side.  Pulmonary/Chest: Breath sounds normal. No respiratory distress. He has no wheezes.  Abdominal: Soft. Bowel sounds are normal. He exhibits no distension. There is no hepatosplenomegaly. There is no tenderness. There is no guarding and no CVA tenderness.  Musculoskeletal: He exhibits no edema.       Right shoulder: He exhibits no swelling and no deformity.  Lymphadenopathy:       Head (right side): No submental and no submandibular adenopathy present.       Head (left side): No submental and no submandibular adenopathy present.       Right cervical: No superficial cervical adenopathy present.      Left cervical: No superficial cervical adenopathy present.       Right: No supraclavicular adenopathy present.       Left: No supraclavicular adenopathy present.  Neurological: He is alert and oriented to person, place, and time. He has normal strength and normal reflexes. No cranial nerve deficit or sensory deficit. Gait normal.  Skin: Skin is warm and dry. No rash noted. No cyanosis. Nails show no clubbing.  Psychiatric: He has a normal mood and affect. His speech is normal and behavior is normal. Judgment and thought content normal. Cognition and memory are normal.   Recent Results (from the past 2160 hour(s))  COMPLETE METABOLIC PANEL WITH GFR     Status: Abnormal   Collection Time: 12/17/15  7:18 AM  Result Value Ref Range   Sodium 142 135 - 146 mmol/L   Potassium 4.4 3.5 - 5.3 mmol/L   Chloride 103 98 - 110 mmol/L   CO2 32 (H) 20 - 31 mmol/L   Glucose, Bld 127 (H) 65 - 99 mg/dL   BUN 13 7 - 25 mg/dL   Creat 1.34 (H) 0.70 - 1.18 mg/dL    Comment:   For patients > or = 74 years of age: The upper reference limit for Creatinine is approximately 13% higher for people identified  as African-American.      Total Bilirubin 0.7 0.2 - 1.2 mg/dL   Alkaline Phosphatase 59 40 - 115 U/L   AST 22 10 - 35 U/L   ALT 19 9 - 46 U/L   Total Protein 6.6 6.1 - 8.1 g/dL   Albumin 3.9 3.6 - 5.1 g/dL   Calcium 9.1 8.6 - 10.3 mg/dL   GFR, Est African American 60 >=60 mL/min  GFR, Est Non African American 52 (L) >=60 mL/min  Hemoglobin A1c     Status: Abnormal   Collection Time: 12/17/15  7:18 AM  Result Value Ref Range   Hgb A1c MFr Bld 7.6 (H) <5.7 %    Comment:   For someone without known diabetes, a hemoglobin A1c value of 6.5% or greater indicates that they may have diabetes and this should be confirmed with a follow-up test.   For someone with known diabetes, a value <7% indicates that their diabetes is well controlled and a value greater than or equal to 7% indicates suboptimal control. A1c targets should be individualized based on duration of diabetes, age, comorbid conditions, and other considerations.   Currently, no consensus exists for use of hemoglobin A1c for diagnosis of diabetes for children.      Mean Plasma Glucose 171 mg/dL   Lipid Panel     Component Value Date/Time   CHOL 181 03/04/2015 0815   TRIG 250 (H) 03/04/2015 0815   HDL 34 (L) 03/04/2015 0815   CHOLHDL 5.3 (H) 03/04/2015 0815   VLDL 50 (H) 03/04/2015 0815   LDLCALC 97 03/04/2015 0815   LDLDIRECT 147.5 06/10/2012 0930     Assessment & Plan:   1. Type 2 diabetes mellitus with stage 2 chronic kidney disease He came with improved blood glucose profile and his A1c is  7.6% Improving from  8.6%. Patient is advised to stick to a routine mealtimes to eat 3 meals  a day and avoid unnecessary snacks ( to snack only to correct hypoglycemia). Patient is advised to eliminate simple carbs  from their diet including:  cakes, desserts, ice cream, soda (  diet and regular) , sweet tea , candies,  chips,  cookies,  artificial sweeteners,   and "sugar-free" products .  This will help stabilize  blood  glucose profile and potentially help patient lose weight. Patient is given detailed personalized glucose monitoring and insulin dosing instructions. Patient is instructed to call back with extremes of blood glucose readings  less than 70 or greater than 300 mg/dl. Patient  is to bring meter and  blood glucose logs during their next visit. I plan to repeat the following labs on subsequent visits.   I advised him to increase Novolin  70/30  30 units with breakfast and continue at 20 units with supper when pre-meal blood glucose is above 90.  2. HYPERCHOLESTEROLEMIA  During his last visit he is LDL was above target at 130 and HDL at 40. Unfortunately Mr. Lasser does not tolerate statins even at low dose of 10 mg of pravastatin. He is advised to continue with omega-3 fatty acids ,avoid butter and fried food and exercise regularly.  3. Essential hypertension  Blood pressure today  is controlled to 118/71 improving from  142/90. Patient is advised to continue amlodipine.   Follow up plan: Return in about 3 months (around 03/21/2016).  Glade Lloyd, MD Phone: [phone]  Fax: [phone]   12/20/2015, 3:12 PM

## 2015-12-20 NOTE — Patient Instructions (Signed)

## 2015-12-24 DIAGNOSIS — N401 Enlarged prostate with lower urinary tract symptoms: Secondary | ICD-10-CM | POA: Diagnosis not present

## 2015-12-24 DIAGNOSIS — G603 Idiopathic progressive neuropathy: Secondary | ICD-10-CM | POA: Diagnosis not present

## 2015-12-24 DIAGNOSIS — Z7901 Long term (current) use of anticoagulants: Secondary | ICD-10-CM | POA: Diagnosis not present

## 2015-12-24 DIAGNOSIS — G5603 Carpal tunnel syndrome, bilateral upper limbs: Secondary | ICD-10-CM | POA: Diagnosis not present

## 2015-12-24 DIAGNOSIS — I10 Essential (primary) hypertension: Secondary | ICD-10-CM | POA: Diagnosis not present

## 2015-12-24 DIAGNOSIS — M545 Low back pain: Secondary | ICD-10-CM | POA: Diagnosis not present

## 2015-12-24 DIAGNOSIS — M130 Polyarthritis, unspecified: Secondary | ICD-10-CM | POA: Diagnosis not present

## 2015-12-24 DIAGNOSIS — E119 Type 2 diabetes mellitus without complications: Secondary | ICD-10-CM | POA: Diagnosis not present

## 2015-12-31 ENCOUNTER — Other Ambulatory Visit: Payer: Self-pay | Admitting: "Endocrinology

## 2016-01-05 DIAGNOSIS — Z23 Encounter for immunization: Secondary | ICD-10-CM | POA: Diagnosis not present

## 2016-01-13 DIAGNOSIS — G603 Idiopathic progressive neuropathy: Secondary | ICD-10-CM | POA: Diagnosis not present

## 2016-01-13 DIAGNOSIS — M5416 Radiculopathy, lumbar region: Secondary | ICD-10-CM | POA: Diagnosis not present

## 2016-02-07 ENCOUNTER — Other Ambulatory Visit: Payer: Self-pay | Admitting: "Endocrinology

## 2016-02-08 DIAGNOSIS — J069 Acute upper respiratory infection, unspecified: Secondary | ICD-10-CM | POA: Diagnosis not present

## 2016-02-08 DIAGNOSIS — J029 Acute pharyngitis, unspecified: Secondary | ICD-10-CM | POA: Diagnosis not present

## 2016-02-08 DIAGNOSIS — Z681 Body mass index (BMI) 19 or less, adult: Secondary | ICD-10-CM | POA: Diagnosis not present

## 2016-02-16 ENCOUNTER — Other Ambulatory Visit (HOSPITAL_COMMUNITY): Payer: Self-pay | Admitting: Urology

## 2016-02-16 DIAGNOSIS — D4101 Neoplasm of uncertain behavior of right kidney: Secondary | ICD-10-CM

## 2016-02-16 DIAGNOSIS — D49511 Neoplasm of unspecified behavior of right kidney: Secondary | ICD-10-CM

## 2016-02-24 ENCOUNTER — Ambulatory Visit (HOSPITAL_COMMUNITY)
Admission: RE | Admit: 2016-02-24 | Discharge: 2016-02-24 | Disposition: A | Discharge status: Home / Self Care | Payer: PPO | Source: Ambulatory Visit | Attending: Urology | Admitting: Urology

## 2016-02-24 ENCOUNTER — Other Ambulatory Visit (HOSPITAL_COMMUNITY): Payer: Self-pay | Admitting: Urology

## 2016-02-24 DIAGNOSIS — K7689 Other specified diseases of liver: Secondary | ICD-10-CM | POA: Insufficient documentation

## 2016-02-24 DIAGNOSIS — N2889 Other specified disorders of kidney and ureter: Secondary | ICD-10-CM | POA: Insufficient documentation

## 2016-02-24 DIAGNOSIS — D49511 Neoplasm of unspecified behavior of right kidney: Secondary | ICD-10-CM

## 2016-02-24 DIAGNOSIS — K760 Fatty (change of) liver, not elsewhere classified: Secondary | ICD-10-CM | POA: Diagnosis not present

## 2016-02-24 DIAGNOSIS — D4101 Neoplasm of uncertain behavior of right kidney: Secondary | ICD-10-CM

## 2016-02-24 DIAGNOSIS — N200 Calculus of kidney: Secondary | ICD-10-CM | POA: Diagnosis not present

## 2016-02-24 DIAGNOSIS — N281 Cyst of kidney, acquired: Secondary | ICD-10-CM | POA: Diagnosis not present

## 2016-02-24 LAB — POCT I-STAT CREATININE: CREATININE: 1.2 mg/dL (ref 0.61–1.24)

## 2016-02-24 MED ORDER — GADOBENATE DIMEGLUMINE 529 MG/ML IV SOLN
20.0000 mL | Freq: Once | INTRAVENOUS | Status: AC | PRN
  Administered 2016-02-24: 20 mL via INTRAVENOUS

## 2016-03-03 DIAGNOSIS — N401 Enlarged prostate with lower urinary tract symptoms: Secondary | ICD-10-CM | POA: Diagnosis not present

## 2016-03-03 DIAGNOSIS — D49511 Neoplasm of unspecified behavior of right kidney: Secondary | ICD-10-CM | POA: Diagnosis not present

## 2016-03-03 DIAGNOSIS — R3912 Poor urinary stream: Secondary | ICD-10-CM | POA: Diagnosis not present

## 2016-03-17 ENCOUNTER — Other Ambulatory Visit: Payer: Self-pay | Admitting: "Endocrinology

## 2016-03-17 DIAGNOSIS — E1122 Type 2 diabetes mellitus with diabetic chronic kidney disease: Secondary | ICD-10-CM | POA: Diagnosis not present

## 2016-03-17 DIAGNOSIS — Z794 Long term (current) use of insulin: Secondary | ICD-10-CM | POA: Diagnosis not present

## 2016-03-17 DIAGNOSIS — N182 Chronic kidney disease, stage 2 (mild): Secondary | ICD-10-CM | POA: Diagnosis not present

## 2016-03-17 DIAGNOSIS — I10 Essential (primary) hypertension: Secondary | ICD-10-CM | POA: Diagnosis not present

## 2016-03-17 LAB — MICROALBUMIN / CREATININE URINE RATIO
Creatinine, Urine: 133 mg/dL (ref 20–370)
Microalb Creat Ratio: 143 mcg/mg creat — ABNORMAL HIGH (ref <30)
Microalb, Ur: 19 mg/dL

## 2016-03-17 LAB — COMPREHENSIVE METABOLIC PANEL
ALBUMIN: 4.1 g/dL (ref 3.6–5.1)
ALK PHOS: 71 U/L (ref 40–115)
ALT: 17 U/L (ref 9–46)
AST: 22 U/L (ref 10–35)
BUN: 13 mg/dL (ref 7–25)
CHLORIDE: 104 mmol/L (ref 98–110)
CO2: 29 mmol/L (ref 20–31)
CREATININE: 1.21 mg/dL — AB (ref 0.70–1.18)
Calcium: 9.2 mg/dL (ref 8.6–10.3)
Glucose, Bld: 89 mg/dL (ref 65–99)
POTASSIUM: 3.8 mmol/L (ref 3.5–5.3)
Sodium: 142 mmol/L (ref 135–146)
TOTAL PROTEIN: 7 g/dL (ref 6.1–8.1)
Total Bilirubin: 0.9 mg/dL (ref 0.2–1.2)

## 2016-03-17 LAB — LIPID PANEL
CHOLESTEROL: 165 mg/dL (ref <200)
HDL: 40 mg/dL — ABNORMAL LOW (ref >40)
LDL Cholesterol: 97 mg/dL (ref <100)
TRIGLYCERIDES: 142 mg/dL (ref <150)
Total CHOL/HDL Ratio: 4.1 Ratio (ref <5.0)
VLDL: 28 mg/dL (ref <30)

## 2016-03-17 LAB — TSH: TSH: 2.03 mIU/L (ref 0.40–4.50)

## 2016-03-17 LAB — HEMOGLOBIN A1C
HEMOGLOBIN A1C: 7.5 % — AB (ref <5.7)
Mean Plasma Glucose: 169 mg/dL

## 2016-03-17 LAB — T4, FREE: FREE T4: 1 ng/dL (ref 0.8–1.8)

## 2016-03-23 ENCOUNTER — Encounter: Payer: Self-pay | Admitting: "Endocrinology

## 2016-03-23 ENCOUNTER — Ambulatory Visit (INDEPENDENT_AMBULATORY_CARE_PROVIDER_SITE_OTHER): Payer: PPO | Admitting: "Endocrinology

## 2016-03-23 VITALS — BP 138/75 | HR 64 | Ht 73.0 in | Wt 228.0 lb

## 2016-03-23 DIAGNOSIS — N182 Chronic kidney disease, stage 2 (mild): Secondary | ICD-10-CM | POA: Diagnosis not present

## 2016-03-23 DIAGNOSIS — Z794 Long term (current) use of insulin: Secondary | ICD-10-CM

## 2016-03-23 DIAGNOSIS — E782 Mixed hyperlipidemia: Secondary | ICD-10-CM

## 2016-03-23 DIAGNOSIS — E1122 Type 2 diabetes mellitus with diabetic chronic kidney disease: Secondary | ICD-10-CM

## 2016-03-23 DIAGNOSIS — I10 Essential (primary) hypertension: Secondary | ICD-10-CM | POA: Diagnosis not present

## 2016-03-23 NOTE — Patient Instructions (Signed)

## 2016-03-23 NOTE — Progress Notes (Signed)
Subjective:    Patient ID: Hector Rose, male    DOB: [birth year],    Past Medical History:  Diagnosis Date  . Asthma 04-11-11   AS CHILD ONLY  . BPH (benign prostatic hypertrophy) with urinary obstruction   . Carpal tunnel syndrome   . Difficult intubation 04-11-11   08-11-97 some issues with intubation/record with chart  . DJD (degenerative joint disease)   . DM (diabetes mellitus) (Hector Rose) 04-11-11   dx. 8-10 yrs. -oral meds ,cbg's(120-150)  . DVT femoral (deep venous thrombosis) with thrombophlebitis ([name]) 04-11-11   ?'09-tx. warfarin, pt being bridge with Lovenox 30mg  x2daily,x 5days  . GERD (gastroesophageal reflux disease) 04-11-11   mild, no med in 1 month  . Gout 04-11-11    ankle 2 yrs ago  . Hemorrhoids   . Hypercholesteremia   . Hypertension   . Internal hemorrhoids   . Keloid 04-11-11   multiple-arms,back, chest  . Pulmonary nodule 04-11-11   "PT. NOT AWARE" -denies problems with breathing  . Renal calculus   . Thyroid disease    hypothyroid   Past Surgical History:  Procedure Laterality Date  . cataract surgery  12/2013  . COSMETIC SURGERY  2000   Keloid injections  . CYSTOSCOPY WITH BIOPSY  04/14/2011   Procedure: CYSTOSCOPY WITH BIOPSY;  Surgeon: Dutch Gray, MD;  Location: [facility];  Service: Urology;  Laterality: N/A;  Bladder Biopsies   . CYSTOSCOPY/RETROGRADE/URETEROSCOPY  04/14/2011   Procedure: CYSTOSCOPY/RETROGRADE/URETEROSCOPY;  Surgeon: Dutch Gray, MD;  Location: [facility];  Service: Urology;  Laterality: Bilateral;  (Bil) RPG   . ortho surgery on fingers  2005 & 2008   Dr. Lorelle Formosa finger release  . PROSTATECTOMY  1999   Dr. Rosana Hoes   Social History   Social History  . Marital status: Married    Spouse name: N/A  . Number of children: 2  . Years of education: N/A   Occupational History  . retired from [employer]  . Smoking status: Never Smoker  . Smokeless tobacco: Never Used  . Alcohol use No  . Drug use: No  .  Sexual activity: Yes   Other Topics Concern  . None   Social History Narrative  . None   Outpatient Encounter Prescriptions as of 03/23/2016  Medication Sig  . acetaminophen (TYLENOL) 500 MG tablet Take 1,000 mg by mouth every 6 (six) hours as needed for moderate pain.  Marland Kitchen allopurinol (ZYLOPRIM) 300 MG tablet TAKE 1 TABLET BY MOUTH ONCE DAILY  . amLODipine (NORVASC) 10 MG tablet TAKE 1 TABLET EVERY MORNING  . b complex vitamins tablet Take 1 tablet by mouth daily.  . BD INSULIN SYRINGE ULTRAFINE 31G X 15/64" 1 ML MISC USE TWICE A DAY AS DIRECTED  . diclofenac (VOLTAREN) 75 MG EC tablet Take 75 mg by mouth 2 (two) times daily.  . fluticasone (FLONASE) 50 MCG/ACT nasal spray Place 1 spray into both nostrils daily as needed for allergies or rhinitis.  Marland Kitchen gabapentin (NEURONTIN) 300 MG capsule Take 1 capsule (300 mg total) by mouth 3 (three) times daily. (Patient taking differently: Take 300 mg by mouth 2 (two) times daily. )  . glucose blood (ONE TOUCH ULTRA TEST) test strip Use as directed 2 x daily. E11.65  . Insulin NPH Isophane & Regular (NOVOLIN 70/30 [place]) Inject 20-40 Units into the skin 2 (two) times daily before a meal. 30 units with breakfast and 20 units with supper if blood glucose is above  90  . metoprolol (LOPRESSOR) 50 MG tablet TAKE 1 TABLET TWICE DAILY  . Multiple Vitamin (MULITIVITAMIN WITH MINERALS) TABS Take 1 tablet by mouth daily. Mens One a Day.  . Omega-3 Fatty Acids (FISH OIL PO) Take 1 capsule by mouth daily.  Marland Kitchen omeprazole (PRILOSEC) 20 MG capsule TAKE ONE CAPSULE BY MOUTH ONCE DAILY AS NEEDED FOR STOMACH ACID  . ONETOUCH DELICA LANCETS 76H MISC Use as directed 2 x daily. E11.65  . pseudoephedrine-acetaminophen (TYLENOL SINUS) 30-500 MG TABS tablet Take 1 tablet by mouth every 4 (four) hours as needed.  . rivaroxaban (XARELTO) 20 MG TABS tablet Take 20 mg by mouth daily with supper.  . tamsulosin (FLOMAX) 0.4 MG CAPS capsule Take 0.4 mg by mouth.  . vitamin B-12  (CYANOCOBALAMIN) 500 MCG tablet Take 500 mcg by mouth daily.  . [DISCONTINUED] NOVOLIN 70/30 [place]ION (70-30) 100 UNIT/ML injection INJECT 30 UNITS SUBCUTANEOUSLY TWICE DAILY (WITH BREAKFAST AND SUPPER) -- DISCONTINUE TOUJEO   No facility-administered encounter medications on file as of 03/23/2016.    ALLERGIES: Allergies  Allergen Reactions  . Ace Inhibitors Swelling    REACTION: angio edema (swelling of lips)  . Metformin And Related     itching   VACCINATION STATUS: Immunization History  Administered Date(s) Administered  . Influenza Split 11/27/2011, 12/30/2012, 01/01/2014  . Influenza Whole 01/27/2009, 11/30/2009, 11/17/2010  . Pneumococcal Polysaccharide-23 12/02/2008    Diabetes  He presents for his follow-up diabetic visit. He has type 2 diabetes mellitus. Onset time: Diagnosed approximately  at age 23. His disease course has been improving. Pertinent negatives for hypoglycemia include no confusion, headaches, pallor or seizures. Pertinent negatives for diabetes include no chest pain, no fatigue, no polydipsia, no polyphagia, no polyuria and no weakness. Symptoms are improving. Risk factors for coronary artery disease include diabetes mellitus, dyslipidemia, hypertension, male sex and sedentary lifestyle. Current diabetic treatment includes insulin injections. His weight is decreasing steadily. He is following a generally unhealthy diet. Meal planning includes ADA exchanges. He participates in exercise intermittently. His breakfast blood glucose range is generally 140-180 mg/dl. His highest blood glucose is 140-180 mg/dl. His overall blood glucose range is 140-180 mg/dl.  Hypertension  This is a chronic problem. The current episode started more than 1 year ago. The problem is controlled. Pertinent negatives include no chest pain, headaches, neck pain, palpitations or shortness of breath. Past treatments include calcium channel blockers.  Hyperlipidemia  This is a chronic problem. The  current episode started more than 1 year ago. The problem is uncontrolled. Pertinent negatives include no chest pain, myalgias or shortness of breath. Current antihyperlipidemic treatment includes bile acid squestrants. Risk factors for coronary artery disease include dyslipidemia and diabetes mellitus.     Review of Systems  Constitutional: Negative for fatigue and unexpected weight change.  HENT: Negative for dental problem, mouth sores and trouble swallowing.   Eyes: Negative for visual disturbance.  Respiratory: Negative for cough, choking, chest tightness, shortness of breath and wheezing.   Cardiovascular: Negative for chest pain, palpitations and leg swelling.  Gastrointestinal: Negative for abdominal distention, abdominal pain, constipation, diarrhea, nausea and vomiting.  Endocrine: Negative for polydipsia, polyphagia and polyuria.  Genitourinary: Negative for dysuria, flank pain, hematuria and urgency.  Musculoskeletal: Negative for back pain, gait problem, joint swelling, myalgias and neck pain.  Skin: Negative for pallor, rash and wound.  Neurological: Negative for seizures, syncope, weakness, numbness and headaches.  Psychiatric/Behavioral: Negative for confusion, dysphoric mood, hallucinations and suicidal ideas.    Objective:  BP 138/75   Pulse 64   Ht 6\' 1"  (1.854 m)   Wt 228 lb (103.4 kg)   BMI 30.08 kg/m   Wt Readings from Last 3 Encounters:  03/23/16 228 lb (103.4 kg)  12/20/15 232 lb (105.2 kg)  09/17/15 233 lb (105.7 kg)    Physical Exam  Constitutional: He is oriented to person, place, and time. He appears well-developed and well-nourished. He is cooperative.  HENT:  Head: Normocephalic and atraumatic.  Eyes: EOM are normal.  Neck: Normal range of motion. Neck supple. No tracheal deviation present. No thyromegaly present.  Cardiovascular: Normal rate and normal heart sounds.  Exam reveals no gallop.   No murmur heard. Pulses:      Carotid pulses are 2+  on the right side.      Radial pulses are 2+ on the right side.       Dorsalis pedis pulses are 2+ on the right side.       Posterior tibial pulses are 2+ on the right side.  Pulmonary/Chest: Breath sounds normal. No respiratory distress. He has no wheezes.  Abdominal: Soft. Bowel sounds are normal. He exhibits no distension. There is no hepatosplenomegaly. There is no tenderness. There is no guarding and no CVA tenderness.  Musculoskeletal: He exhibits no edema.       Right shoulder: He exhibits no swelling and no deformity.  Lymphadenopathy:       Head (right side): No submental and no submandibular adenopathy present.       Head (left side): No submental and no submandibular adenopathy present.       Right cervical: No superficial cervical adenopathy present.      Left cervical: No superficial cervical adenopathy present.       Right: No supraclavicular adenopathy present.       Left: No supraclavicular adenopathy present.  Neurological: He is alert and oriented to person, place, and time. He has normal strength and normal reflexes. No cranial nerve deficit or sensory deficit. Gait normal.  Skin: Skin is warm and dry. No rash noted. No cyanosis. Nails show no clubbing.  Psychiatric: He has a normal mood and affect. His speech is normal and behavior is normal. Judgment and thought content normal. Cognition and memory are normal.   Recent Results (from the past 2160 hour(s))  I-STAT creatinine     Status: None   Collection Time: 02/24/16  3:51 PM  Result Value Ref Range   Creatinine, Ser 1.20 0.61 - 1.24 mg/dL  Microalbumin / creatinine urine ratio     Status: Abnormal   Collection Time: 03/17/16  7:14 AM  Result Value Ref Range   Creatinine, Urine 133 20 - 370 mg/dL   Microalb, Ur 19.0 Not estab mg/dL   Microalb Creat Ratio 143 (H) <30 mcg/mg creat    Comment: The ADA has defined abnormalities in albumin excretion as follows:           Category           Result                             (mcg/mg creatinine)                 Normal:    <30       Microalbuminuria:    30 - 299   Clinical albuminuria:    > or = 300   The ADA recommends that at least  two of three specimens collected within a 3 - 6 month period be abnormal before considering a patient to be within a diagnostic category.     Comprehensive metabolic panel     Status: Abnormal   Collection Time: 03/17/16  7:14 AM  Result Value Ref Range   Sodium 142 135 - 146 mmol/L   Potassium 3.8 3.5 - 5.3 mmol/L   Chloride 104 98 - 110 mmol/L   CO2 29 20 - 31 mmol/L   Glucose, Bld 89 65 - 99 mg/dL   BUN 13 7 - 25 mg/dL   Creat 1.21 (H) 0.70 - 1.18 mg/dL    Comment:   For patients > or = 75 years of age: The upper reference limit for Creatinine is approximately 13% higher for people identified as African-American.      Total Bilirubin 0.9 0.2 - 1.2 mg/dL   Alkaline Phosphatase 71 40 - 115 U/L   AST 22 10 - 35 U/L   ALT 17 9 - 46 U/L   Total Protein 7.0 6.1 - 8.1 g/dL   Albumin 4.1 3.6 - 5.1 g/dL   Calcium 9.2 8.6 - 10.3 mg/dL  Lipid panel     Status: Abnormal   Collection Time: 03/17/16  7:14 AM  Result Value Ref Range   Cholesterol 165 <200 mg/dL   Triglycerides 142 <150 mg/dL   HDL 40 (L) >40 mg/dL   Total CHOL/HDL Ratio 4.1 <5.0 Ratio   VLDL 28 <30 mg/dL   LDL Cholesterol 97 <100 mg/dL  TSH     Status: None   Collection Time: 03/17/16  7:14 AM  Result Value Ref Range   TSH 2.03 0.40 - 4.50 mIU/L  T4, free     Status: None   Collection Time: 03/17/16  7:14 AM  Result Value Ref Range   Free T4 1.0 0.8 - 1.8 ng/dL  Hemoglobin A1c     Status: Abnormal   Collection Time: 03/17/16  7:14 AM  Result Value Ref Range   Hgb A1c MFr Bld 7.5 (H) <5.7 %    Comment:   For someone without known diabetes, a hemoglobin A1c value of 6.5% or greater indicates that they may have diabetes and this should be confirmed with a follow-up test.   For someone with known diabetes, a value <7% indicates that  their diabetes is well controlled and a value greater than or equal to 7% indicates suboptimal control. A1c targets should be individualized based on duration of diabetes, age, comorbid conditions, and other considerations.   Currently, no consensus exists for use of hemoglobin A1c for diagnosis of diabetes for children.      Mean Plasma Glucose 169 mg/dL   Lipid Panel     Component Value Date/Time   CHOL 165 03/17/2016 0714   TRIG 142 03/17/2016 0714   HDL 40 (L) 03/17/2016 0714   CHOLHDL 4.1 03/17/2016 0714   VLDL 28 03/17/2016 0714   LDLCALC 97 03/17/2016 0714   LDLDIRECT 147.5 06/10/2012 0930     Assessment & Plan:   1. Type 2 diabetes mellitus with stage 2 chronic kidney disease He came with improved blood glucose profile and his A1c is  7.5% Improving from  8.6%. Patient is advised to stick to a routine mealtimes to eat 3 meals  a day and avoid unnecessary snacks ( to snack only to correct hypoglycemia). Patient is advised to eliminate simple carbs  from their diet including:  cakes, desserts, ice cream, soda (  diet  and regular) , sweet tea , candies,  chips,  cookies,  artificial sweeteners,   and "sugar-free" products .  This will help stabilize  blood glucose profile and potentially help patient lose weight. Patient is given detailed personalized glucose monitoring and insulin dosing instructions. Patient is instructed to call back with extremes of blood glucose readings  less than 70 or greater than 300 mg/dl. Patient  is to bring meter and  blood glucose logs during their next visit. I plan to repeat the following labs on subsequent visits.   I advised him to Continue Novolin 70/30 at  30 units with breakfast and continue at 30 units with supper when pre-meal blood glucose is above 90. - His renal function is improving, however he did not tolerate full dose metformin in the past. We will attempt a low-dose metformin during his next visit.  2.  HYPERCHOLESTEROLEMIA  During his last visit he is LDL was above target at 130 and HDL at 40. Unfortunately Hector Rose does not tolerate statins even at low dose of 10 mg of pravastatin. He is advised to continue with omega-3 fatty acids ,avoid butter and fried food and exercise regularly.  3. Essential hypertension  Blood pressure today  is controlled to 138/75 improving from  142/90. Patient is advised to continue amlodipine.   Follow up plan: Return for meter, and logs.  Glade Lloyd, MD Phone: [phone]  Fax: [phone]   03/23/2016, 1:45 PM

## 2016-04-03 ENCOUNTER — Other Ambulatory Visit: Payer: Self-pay | Admitting: "Endocrinology

## 2016-04-10 DIAGNOSIS — E119 Type 2 diabetes mellitus without complications: Secondary | ICD-10-CM | POA: Diagnosis not present

## 2016-04-10 DIAGNOSIS — N401 Enlarged prostate with lower urinary tract symptoms: Secondary | ICD-10-CM | POA: Diagnosis not present

## 2016-04-10 DIAGNOSIS — M5418 Radiculopathy, sacral and sacrococcygeal region: Secondary | ICD-10-CM | POA: Diagnosis not present

## 2016-04-10 DIAGNOSIS — G5603 Carpal tunnel syndrome, bilateral upper limbs: Secondary | ICD-10-CM | POA: Diagnosis not present

## 2016-04-10 DIAGNOSIS — I10 Essential (primary) hypertension: Secondary | ICD-10-CM | POA: Diagnosis not present

## 2016-04-10 DIAGNOSIS — Z7901 Long term (current) use of anticoagulants: Secondary | ICD-10-CM | POA: Diagnosis not present

## 2016-04-10 DIAGNOSIS — M130 Polyarthritis, unspecified: Secondary | ICD-10-CM | POA: Diagnosis not present

## 2016-04-10 DIAGNOSIS — M545 Low back pain: Secondary | ICD-10-CM | POA: Diagnosis not present

## 2016-04-26 DIAGNOSIS — L308 Other specified dermatitis: Secondary | ICD-10-CM | POA: Diagnosis not present

## 2016-04-26 DIAGNOSIS — L910 Hypertrophic scar: Secondary | ICD-10-CM | POA: Diagnosis not present

## 2016-05-01 DIAGNOSIS — J329 Chronic sinusitis, unspecified: Secondary | ICD-10-CM | POA: Diagnosis not present

## 2016-05-01 DIAGNOSIS — C679 Malignant neoplasm of bladder, unspecified: Secondary | ICD-10-CM | POA: Diagnosis not present

## 2016-05-01 DIAGNOSIS — E1129 Type 2 diabetes mellitus with other diabetic kidney complication: Secondary | ICD-10-CM | POA: Diagnosis not present

## 2016-05-01 DIAGNOSIS — Z6830 Body mass index (BMI) 30.0-30.9, adult: Secondary | ICD-10-CM | POA: Diagnosis not present

## 2016-05-03 ENCOUNTER — Encounter (HOSPITAL_COMMUNITY): Payer: Self-pay | Admitting: Cardiology

## 2016-05-03 ENCOUNTER — Emergency Department (HOSPITAL_COMMUNITY): Payer: No Typology Code available for payment source

## 2016-05-03 ENCOUNTER — Emergency Department (HOSPITAL_COMMUNITY)
Admission: EM | Admit: 2016-05-03 | Discharge: 2016-05-03 | Disposition: A | Discharge status: Home / Self Care | Payer: No Typology Code available for payment source | Attending: Emergency Medicine | Admitting: Emergency Medicine

## 2016-05-03 DIAGNOSIS — S7001XA Contusion of right hip, initial encounter: Secondary | ICD-10-CM | POA: Insufficient documentation

## 2016-05-03 DIAGNOSIS — J069 Acute upper respiratory infection, unspecified: Secondary | ICD-10-CM | POA: Insufficient documentation

## 2016-05-03 DIAGNOSIS — J45909 Unspecified asthma, uncomplicated: Secondary | ICD-10-CM | POA: Diagnosis not present

## 2016-05-03 DIAGNOSIS — Y999 Unspecified external cause status: Secondary | ICD-10-CM | POA: Diagnosis not present

## 2016-05-03 DIAGNOSIS — S3992XA Unspecified injury of lower back, initial encounter: Secondary | ICD-10-CM | POA: Diagnosis not present

## 2016-05-03 DIAGNOSIS — Y9389 Activity, other specified: Secondary | ICD-10-CM | POA: Diagnosis not present

## 2016-05-03 DIAGNOSIS — Z794 Long term (current) use of insulin: Secondary | ICD-10-CM | POA: Insufficient documentation

## 2016-05-03 DIAGNOSIS — I129 Hypertensive chronic kidney disease with stage 1 through stage 4 chronic kidney disease, or unspecified chronic kidney disease: Secondary | ICD-10-CM | POA: Diagnosis not present

## 2016-05-03 DIAGNOSIS — N182 Chronic kidney disease, stage 2 (mild): Secondary | ICD-10-CM | POA: Diagnosis not present

## 2016-05-03 DIAGNOSIS — E1122 Type 2 diabetes mellitus with diabetic chronic kidney disease: Secondary | ICD-10-CM | POA: Diagnosis not present

## 2016-05-03 DIAGNOSIS — S39012A Strain of muscle, fascia and tendon of lower back, initial encounter: Secondary | ICD-10-CM | POA: Insufficient documentation

## 2016-05-03 DIAGNOSIS — Z79899 Other long term (current) drug therapy: Secondary | ICD-10-CM | POA: Diagnosis not present

## 2016-05-03 DIAGNOSIS — S79911A Unspecified injury of right hip, initial encounter: Secondary | ICD-10-CM | POA: Diagnosis not present

## 2016-05-03 DIAGNOSIS — M545 Low back pain: Secondary | ICD-10-CM | POA: Diagnosis not present

## 2016-05-03 DIAGNOSIS — Y92410 Unspecified street and highway as the place of occurrence of the external cause: Secondary | ICD-10-CM | POA: Diagnosis not present

## 2016-05-03 DIAGNOSIS — M5489 Other dorsalgia: Secondary | ICD-10-CM | POA: Diagnosis not present

## 2016-05-03 MED ORDER — HYDROCODONE-ACETAMINOPHEN 5-325 MG PO TABS
1.0000 | ORAL_TABLET | Freq: Once | ORAL | Status: AC
  Administered 2016-05-03: 1 via ORAL
  Filled 2016-05-03: qty 1

## 2016-05-03 MED ORDER — METHOCARBAMOL 500 MG PO TABS: 500.0000 mg | ORAL_TABLET | Freq: Two times a day (BID) | ORAL | 0 refills | Status: DC

## 2016-05-03 MED ORDER — METHOCARBAMOL 500 MG PO TABS
500.0000 mg | ORAL_TABLET | Freq: Once | ORAL | Status: AC
  Administered 2016-05-03: 500 mg via ORAL
  Filled 2016-05-03: qty 1
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
## 2016-05-03 NOTE — ED Notes (Signed)
Pt made aware to return if symptoms worsen or if any life threatening symptoms occur.   
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
## 2016-05-03 NOTE — Discharge Instructions (Signed)
The x-ray of your lumbar spine reveals some degenerative changes, but no fracture or dislocation. The x-ray of your hip and pelvis are negative for fracture or dislocation. I suspect that you have a muscle strain in your lower back and and a contusion to your hip. Please use Tylenol every 4 hours. May use Robaxin 2 times daily for spasm pain. This medication may cause drowsiness, please use it with caution. It is noted during your examination at you have upper respiratory symptoms on. Please increase fluids. Please wash her hands frequently. Use Tylenol every 4 hours for fever or aching. Use Robitussin for cough. Please see Dr. Hilma Favors in the office for additional evaluation and management following her accident if not improving.

## 2016-05-04 ENCOUNTER — Ambulatory Visit (INDEPENDENT_AMBULATORY_CARE_PROVIDER_SITE_OTHER): Payer: PPO | Admitting: Otolaryngology

## 2016-05-04 ENCOUNTER — Other Ambulatory Visit (INDEPENDENT_AMBULATORY_CARE_PROVIDER_SITE_OTHER): Payer: Self-pay | Admitting: Otolaryngology

## 2016-05-04 DIAGNOSIS — R1310 Dysphagia, unspecified: Secondary | ICD-10-CM

## 2016-05-04 DIAGNOSIS — R1312 Dysphagia, oropharyngeal phase: Secondary | ICD-10-CM | POA: Diagnosis not present

## 2016-05-05 DIAGNOSIS — Z1389 Encounter for screening for other disorder: Secondary | ICD-10-CM | POA: Diagnosis not present

## 2016-05-05 DIAGNOSIS — J329 Chronic sinusitis, unspecified: Secondary | ICD-10-CM | POA: Diagnosis not present

## 2016-05-05 DIAGNOSIS — Z6830 Body mass index (BMI) 30.0-30.9, adult: Secondary | ICD-10-CM | POA: Diagnosis not present

## 2016-05-09 ENCOUNTER — Other Ambulatory Visit: Payer: Self-pay | Admitting: "Endocrinology

## 2016-05-11 ENCOUNTER — Ambulatory Visit (HOSPITAL_COMMUNITY)
Admission: RE | Admit: 2016-05-11 | Discharge: 2016-05-11 | Disposition: A | Discharge status: Home / Self Care | Payer: PPO | Source: Ambulatory Visit | Attending: Otolaryngology | Admitting: Otolaryngology

## 2016-05-11 DIAGNOSIS — K224 Dyskinesia of esophagus: Secondary | ICD-10-CM | POA: Insufficient documentation

## 2016-05-11 DIAGNOSIS — R1310 Dysphagia, unspecified: Secondary | ICD-10-CM | POA: Insufficient documentation

## 2016-06-06 DIAGNOSIS — Z6830 Body mass index (BMI) 30.0-30.9, adult: Secondary | ICD-10-CM | POA: Diagnosis not present

## 2016-06-06 DIAGNOSIS — E6609 Other obesity due to excess calories: Secondary | ICD-10-CM | POA: Diagnosis not present

## 2016-06-06 DIAGNOSIS — E1140 Type 2 diabetes mellitus with diabetic neuropathy, unspecified: Secondary | ICD-10-CM | POA: Diagnosis not present

## 2016-06-15 ENCOUNTER — Other Ambulatory Visit: Payer: Self-pay | Admitting: "Endocrinology

## 2016-06-15 DIAGNOSIS — N182 Chronic kidney disease, stage 2 (mild): Secondary | ICD-10-CM | POA: Diagnosis not present

## 2016-06-15 DIAGNOSIS — E1122 Type 2 diabetes mellitus with diabetic chronic kidney disease: Secondary | ICD-10-CM | POA: Diagnosis not present

## 2016-06-15 DIAGNOSIS — Z794 Long term (current) use of insulin: Secondary | ICD-10-CM | POA: Diagnosis not present

## 2016-06-15 LAB — COMPREHENSIVE METABOLIC PANEL
ALT: 20 U/L (ref 9–46)
AST: 21 U/L (ref 10–35)
Albumin: 3.9 g/dL (ref 3.6–5.1)
Alkaline Phosphatase: 68 U/L (ref 40–115)
BILIRUBIN TOTAL: 0.4 mg/dL (ref 0.2–1.2)
BUN: 11 mg/dL (ref 7–25)
CHLORIDE: 104 mmol/L (ref 98–110)
CO2: 31 mmol/L (ref 20–31)
CREATININE: 1.21 mg/dL — AB (ref 0.70–1.18)
Calcium: 9.1 mg/dL (ref 8.6–10.3)
Glucose, Bld: 155 mg/dL — ABNORMAL HIGH (ref 65–99)
Potassium: 4.4 mmol/L (ref 3.5–5.3)
SODIUM: 142 mmol/L (ref 135–146)
TOTAL PROTEIN: 6.4 g/dL (ref 6.1–8.1)

## 2016-06-16 LAB — HEMOGLOBIN A1C
Hgb A1c MFr Bld: 8.1 % — ABNORMAL HIGH (ref <5.7)
Mean Plasma Glucose: 186 mg/dL

## 2016-06-22 ENCOUNTER — Ambulatory Visit (INDEPENDENT_AMBULATORY_CARE_PROVIDER_SITE_OTHER): Payer: PPO | Admitting: "Endocrinology

## 2016-06-22 ENCOUNTER — Encounter: Payer: Self-pay | Admitting: "Endocrinology

## 2016-06-22 VITALS — BP 122/69 | HR 65 | Ht 73.0 in | Wt 232.0 lb

## 2016-06-22 DIAGNOSIS — E1122 Type 2 diabetes mellitus with diabetic chronic kidney disease: Secondary | ICD-10-CM

## 2016-06-22 DIAGNOSIS — N182 Chronic kidney disease, stage 2 (mild): Secondary | ICD-10-CM

## 2016-06-22 DIAGNOSIS — Z794 Long term (current) use of insulin: Secondary | ICD-10-CM

## 2016-06-22 DIAGNOSIS — E782 Mixed hyperlipidemia: Secondary | ICD-10-CM | POA: Diagnosis not present

## 2016-06-22 DIAGNOSIS — I10 Essential (primary) hypertension: Secondary | ICD-10-CM

## 2016-06-22 NOTE — Patient Instructions (Signed)

## 2016-06-22 NOTE — Progress Notes (Signed)
Subjective:    Patient ID: Hector Rose, male    DOB: 1940-06-26,    Past Medical History:  Diagnosis Date  . Asthma 04-11-11   AS CHILD ONLY  . BPH (benign prostatic hypertrophy) with urinary obstruction   . Carpal tunnel syndrome   . Difficult intubation 04-11-11   08-11-97 some issues with intubation/record with chart  . DJD (degenerative joint disease)   . DM (diabetes mellitus) ([place]) 04-11-11   dx. 8-10 yrs. -oral meds ,cbg's(120-150)  . DVT femoral (deep venous thrombosis) with thrombophlebitis ([place]) 04-11-11   ?'09-tx. warfarin, pt being bridge with Lovenox 30mg  x2daily,x 5days  . GERD (gastroesophageal reflux disease) 04-11-11   mild, no med in 1 month  . Gout 04-11-11    ankle 2 yrs ago  . Hemorrhoids   . Hypercholesteremia   . Hypertension   . Internal hemorrhoids   . Keloid 04-11-11   multiple-arms,back, chest  . Pulmonary nodule 04-11-11   "PT. NOT AWARE" -denies problems with breathing  . Renal calculus   . Thyroid disease    hypothyroid   Past Surgical History:  Procedure Laterality Date  . cataract surgery  12/2013  . COSMETIC SURGERY  2000   Keloid injections  . CYSTOSCOPY WITH BIOPSY  04/14/2011   Procedure: CYSTOSCOPY WITH BIOPSY;  Surgeon: Dutch Gray, MD;  Location: [facility];  Service: Urology;  Laterality: N/A;  Bladder Biopsies   . CYSTOSCOPY/RETROGRADE/URETEROSCOPY  04/14/2011   Procedure: CYSTOSCOPY/RETROGRADE/URETEROSCOPY;  Surgeon: Dutch Gray, MD;  Location: [facility];  Service: Urology;  Laterality: Bilateral;  (Bil) RPG   . ortho surgery on fingers  2005 & 2008   Dr. Lorelle Formosa finger release  . PROSTATECTOMY  1999   Dr. Rosana Hoes   Social History   Social History  . Marital status: Married    Spouse name: N/A  . Number of children: 2  . Years of education: N/A   Occupational History  . retired from [employer]  . Smoking status: Never Smoker  . Smokeless tobacco: Never Used  . Alcohol use No  . Drug use: No  .  Sexual activity: Yes   Other Topics Concern  . None   Social History Narrative  . None   Outpatient Encounter Prescriptions as of 06/22/2016  Medication Sig  . acetaminophen (TYLENOL) 500 MG tablet Take 1,000 mg by mouth every 6 (six) hours as needed for moderate pain.  Marland Kitchen allopurinol (ZYLOPRIM) 300 MG tablet TAKE 1 TABLET BY MOUTH ONCE DAILY  . amLODipine (NORVASC) 10 MG tablet TAKE 1 TABLET EVERY MORNING  . b complex vitamins tablet Take 1 tablet by mouth daily.  . BD INSULIN SYRINGE ULTRAFINE 31G X 15/64" 1 ML MISC USE TWICE A DAY AS DIRECTED  . diclofenac (VOLTAREN) 75 MG EC tablet Take 75 mg by mouth 2 (two) times daily.  . fluticasone (FLONASE) 50 MCG/ACT nasal spray Place 1 spray into both nostrils daily as needed for allergies or rhinitis.  Marland Kitchen gabapentin (NEURONTIN) 300 MG capsule Take 1 capsule (300 mg total) by mouth 3 (three) times daily. (Patient taking differently: Take 300 mg by mouth 2 (two) times daily. )  . Insulin NPH Isophane & Regular (NOVOLIN 70/30 [place]) Inject 30-40 Units into the skin 2 (two) times daily before a meal. 40 units with breakfast and 30 units with supper  . methocarbamol (ROBAXIN) 500 MG tablet Take 1 tablet (500 mg total) by mouth 2 (two) times daily.  . metoprolol (  LOPRESSOR) 50 MG tablet TAKE 1 TABLET TWICE DAILY  . Multiple Vitamin (MULITIVITAMIN WITH MINERALS) TABS Take 1 tablet by mouth daily. Mens One a Day.  . Omega-3 Fatty Acids (FISH OIL PO) Take 1 capsule by mouth daily.  Marland Kitchen omeprazole (PRILOSEC) 20 MG capsule TAKE ONE CAPSULE BY MOUTH ONCE DAILY AS NEEDED FOR STOMACH ACID  . ONE TOUCH ULTRA TEST test strip USE TO TEST BLOOD SUGAR TWICE DAILY.  Marland Kitchen ONETOUCH DELICA LANCETS 96V MISC Use as directed 2 x daily. E11.65  . pseudoephedrine-acetaminophen (TYLENOL SINUS) 30-500 MG TABS tablet Take 1 tablet by mouth every 4 (four) hours as needed.  . rivaroxaban (XARELTO) 20 MG TABS tablet Take 20 mg by mouth daily with supper.  . tamsulosin (FLOMAX) 0.4 MG  CAPS capsule Take 0.4 mg by mouth.  . vitamin B-12 (CYANOCOBALAMIN) 500 MCG tablet Take 500 mcg by mouth daily.  . [DISCONTINUED] NOVOLIN 70/30 RELION (70-30) 100 UNIT/ML injection INJECT 30 UNITS SUBCUTANEOUSLY TWICE DAILY (WITH BREAKFAST AND  SUPPER)  --  **DISCONTINUE  TOUJEO**   No facility-administered encounter medications on file as of 06/22/2016.    ALLERGIES: Allergies  Allergen Reactions  . Ace Inhibitors Swelling    REACTION: angio edema (swelling of lips)  . Metformin And Related     itching   VACCINATION STATUS: Immunization History  Administered Date(s) Administered  . Influenza Split 11/27/2011, 12/30/2012, 01/01/2014  . Influenza Whole 01/27/2009, 11/30/2009, 11/17/2010  . Pneumococcal Polysaccharide-23 12/02/2008    Diabetes  He presents for his follow-up diabetic visit. He has type 2 diabetes mellitus. Onset time: Diagnosed approximately  at age 34. His disease course has been worsening. Pertinent negatives for hypoglycemia include no confusion, headaches, pallor or seizures. Pertinent negatives for diabetes include no chest pain, no fatigue, no polydipsia, no polyphagia, no polyuria and no weakness. Symptoms are worsening. Risk factors for coronary artery disease include diabetes mellitus, dyslipidemia, hypertension, male sex and sedentary lifestyle. Current diabetic treatment includes insulin injections. His weight is increasing steadily. He is following a generally unhealthy diet. Meal planning includes ADA exchanges. He participates in exercise intermittently. His breakfast blood glucose range is generally 140-180 mg/dl. His dinner blood glucose range is generally 180-200 mg/dl. His overall blood glucose range is 180-200 mg/dl.  Hypertension  This is a chronic problem. The current episode started more than 1 year ago. The problem is controlled. Pertinent negatives include no chest pain, headaches, neck pain, palpitations or shortness of breath. Past treatments include  calcium channel blockers.  Hyperlipidemia  This is a chronic problem. The current episode started more than 1 year ago. The problem is uncontrolled. Pertinent negatives include no chest pain, myalgias or shortness of breath. Current antihyperlipidemic treatment includes bile acid squestrants. Risk factors for coronary artery disease include dyslipidemia and diabetes mellitus.     Review of Systems  Constitutional: Negative for fatigue and unexpected weight change.  HENT: Negative for dental problem, mouth sores and trouble swallowing.   Eyes: Negative for visual disturbance.  Respiratory: Negative for cough, choking, chest tightness, shortness of breath and wheezing.   Cardiovascular: Negative for chest pain, palpitations and leg swelling.  Gastrointestinal: Negative for abdominal distention, abdominal pain, constipation, diarrhea, nausea and vomiting.  Endocrine: Negative for polydipsia, polyphagia and polyuria.  Genitourinary: Negative for dysuria, flank pain, hematuria and urgency.  Musculoskeletal: Negative for back pain, gait problem, joint swelling, myalgias and neck pain.  Skin: Negative for pallor, rash and wound.  Neurological: Negative for seizures, syncope, weakness, numbness and headaches.  Psychiatric/Behavioral: Negative for confusion, dysphoric mood, hallucinations and suicidal ideas.    Objective:    BP 122/69   Pulse 65   Ht 6\' 1"  (1.854 m)   Wt 232 lb (105.2 kg)   BMI 30.61 kg/m   Wt Readings from Last 3 Encounters:  06/22/16 232 lb (105.2 kg)  05/03/16 225 lb (102.1 kg)  03/23/16 228 lb (103.4 kg)    Physical Exam  Constitutional: He is oriented to person, place, and time. He appears well-developed and well-nourished. He is cooperative.  HENT:  Head: Normocephalic and atraumatic.  Eyes: EOM are normal.  Neck: Normal range of motion. Neck supple. No tracheal deviation present. No thyromegaly present.  Cardiovascular: Normal rate and normal heart sounds.  Exam  reveals no gallop.   No murmur heard. Pulses:      Carotid pulses are 2+ on the right side.      Radial pulses are 2+ on the right side.       Dorsalis pedis pulses are 2+ on the right side.       Posterior tibial pulses are 2+ on the right side.  Pulmonary/Chest: Breath sounds normal. No respiratory distress. He has no wheezes.  Abdominal: Soft. Bowel sounds are normal. He exhibits no distension. There is no hepatosplenomegaly. There is no tenderness. There is no guarding and no CVA tenderness.  Musculoskeletal: He exhibits no edema.       Right shoulder: He exhibits no swelling and no deformity.  Lymphadenopathy:       Head (right side): No submental and no submandibular adenopathy present.       Head (left side): No submental and no submandibular adenopathy present.       Right cervical: No superficial cervical adenopathy present.      Left cervical: No superficial cervical adenopathy present.       Right: No supraclavicular adenopathy present.       Left: No supraclavicular adenopathy present.  Neurological: He is alert and oriented to person, place, and time. He has normal strength and normal reflexes. No cranial nerve deficit or sensory deficit. Gait normal.  Skin: Skin is warm and dry. No rash noted. No cyanosis. Nails show no clubbing.  Psychiatric: He has a normal mood and affect. His speech is normal and behavior is normal. Judgment and thought content normal. Cognition and memory are normal.   Recent Results (from the past 2160 hour(s))  Comprehensive metabolic panel     Status: Abnormal   Collection Time: 06/15/16  7:23 AM  Result Value Ref Range   Sodium 142 135 - 146 mmol/L   Potassium 4.4 3.5 - 5.3 mmol/L   Chloride 104 98 - 110 mmol/L   CO2 31 20 - 31 mmol/L   Glucose, Bld 155 (H) 65 - 99 mg/dL   BUN 11 7 - 25 mg/dL   Creat 1.21 (H) 0.70 - 1.18 mg/dL    Comment:   For patients > or = 75 years of age: The upper reference limit for Creatinine is approximately 13%  higher for people identified as African-American.      Total Bilirubin 0.4 0.2 - 1.2 mg/dL   Alkaline Phosphatase 68 40 - 115 U/L   AST 21 10 - 35 U/L   ALT 20 9 - 46 U/L   Total Protein 6.4 6.1 - 8.1 g/dL   Albumin 3.9 3.6 - 5.1 g/dL   Calcium 9.1 8.6 - 10.3 mg/dL  Hemoglobin A1c     Status: Abnormal  Collection Time: 06/15/16  7:23 AM  Result Value Ref Range   Hgb A1c MFr Bld 8.1 (H) <5.7 %    Comment:   For someone without known diabetes, a hemoglobin A1c value of 6.5% or greater indicates that they may have diabetes and this should be confirmed with a follow-up test.   For someone with known diabetes, a value <7% indicates that their diabetes is well controlled and a value greater than or equal to 7% indicates suboptimal control. A1c targets should be individualized based on duration of diabetes, age, comorbid conditions, and other considerations.   Currently, no consensus exists for use of hemoglobin A1c for diagnosis of diabetes for children.      Mean Plasma Glucose 186 mg/dL   Lipid Panel     Component Value Date/Time   CHOL 165 03/17/2016 0714   TRIG 142 03/17/2016 0714   HDL 40 (L) 03/17/2016 0714   CHOLHDL 4.1 03/17/2016 0714   VLDL 28 03/17/2016 0714   LDLCALC 97 03/17/2016 0714   LDLDIRECT 147.5 06/10/2012 0930     Assessment & Plan:   1. Type 2 diabetes mellitus with stage 2 chronic kidney disease He came with improved  Fasting but above target suppertime blood glucose profile and his A1c is  increasing to 8.1% from 7.5% . Patient is advised to stick to a routine mealtimes to eat 3 meals  a day and avoid unnecessary snacks ( to snack only to correct hypoglycemia). Patient is advised to eliminate simple carbs  from their diet including:  cakes, desserts, ice cream, soda (  diet and regular) , sweet tea , candies,  chips,  cookies,  artificial sweeteners,   and "sugar-free" products .  This will help stabilize  blood glucose profile and potentially help  patient lose weight. Patient is given detailed personalized glucose monitoring and insulin dosing instructions. Patient is instructed to call back with extremes of blood glucose readings  less than 70 or greater than 300 mg/dl. Patient  is to bring meter and  blood glucose logs during their next visit. I plan to repeat the following labs on subsequent visits.   I advised him to  Increase Novolin 70/30 to 40 units with breakfast and continue at 30 units with supper when pre-meal blood glucose is above 90. - His renal function is improving, however he did not tolerate full dose metformin in the past. We will attempt a low-dose metformin during his next visit.  2. HYPERCHOLESTEROLEMIA  During his last visit he is LDL was above target at 130 and HDL at 40. Unfortunately Mr. Krupka does not tolerate statins even at low dose of 10 mg of pravastatin. He is advised to continue with omega-3 fatty acids ,avoid butter and fried food and exercise regularly.  3. Essential hypertension  Blood pressure today  is controlled to 138/75 improving from  142/90. Patient is advised to continue amlodipine.   Follow up plan: Return in about 3 months (around 09/21/2016) for follow up with pre-visit labs, meter, and logs.  Glade Lloyd, MD Phone: [phone]  Fax: [phone]   06/22/2016, 1:58 PM

## 2016-06-23 DIAGNOSIS — J302 Other seasonal allergic rhinitis: Secondary | ICD-10-CM | POA: Diagnosis not present

## 2016-06-23 DIAGNOSIS — J343 Hypertrophy of nasal turbinates: Secondary | ICD-10-CM | POA: Diagnosis not present

## 2016-06-23 DIAGNOSIS — E782 Mixed hyperlipidemia: Secondary | ICD-10-CM | POA: Diagnosis not present

## 2016-06-23 DIAGNOSIS — E6609 Other obesity due to excess calories: Secondary | ICD-10-CM | POA: Diagnosis not present

## 2016-06-23 DIAGNOSIS — R070 Pain in throat: Secondary | ICD-10-CM | POA: Diagnosis not present

## 2016-06-23 DIAGNOSIS — J069 Acute upper respiratory infection, unspecified: Secondary | ICD-10-CM | POA: Diagnosis not present

## 2016-06-23 DIAGNOSIS — E1140 Type 2 diabetes mellitus with diabetic neuropathy, unspecified: Secondary | ICD-10-CM | POA: Diagnosis not present

## 2016-06-23 DIAGNOSIS — Z6830 Body mass index (BMI) 30.0-30.9, adult: Secondary | ICD-10-CM | POA: Diagnosis not present

## 2016-07-08 ENCOUNTER — Other Ambulatory Visit: Payer: Self-pay | Admitting: "Endocrinology

## 2016-07-20 ENCOUNTER — Ambulatory Visit: Payer: PPO | Admitting: Pulmonary Disease

## 2016-07-20 ENCOUNTER — Encounter: Payer: Self-pay | Admitting: *Deleted

## 2016-07-20 VITALS — BP 120/72 | HR 55 | Temp 98.3°F | Ht 73.0 in | Wt 227.4 lb

## 2016-07-20 DIAGNOSIS — R911 Solitary pulmonary nodule: Secondary | ICD-10-CM

## 2016-07-20 DIAGNOSIS — N138 Other obstructive and reflux uropathy: Secondary | ICD-10-CM

## 2016-07-20 DIAGNOSIS — Z794 Long term (current) use of insulin: Secondary | ICD-10-CM

## 2016-07-20 DIAGNOSIS — J948 Other specified pleural conditions: Secondary | ICD-10-CM

## 2016-07-20 DIAGNOSIS — I10 Essential (primary) hypertension: Secondary | ICD-10-CM

## 2016-07-20 DIAGNOSIS — N182 Chronic kidney disease, stage 2 (mild): Secondary | ICD-10-CM

## 2016-07-20 DIAGNOSIS — E782 Mixed hyperlipidemia: Secondary | ICD-10-CM

## 2016-07-20 DIAGNOSIS — N401 Enlarged prostate with lower urinary tract symptoms: Secondary | ICD-10-CM

## 2016-07-20 DIAGNOSIS — N289 Disorder of kidney and ureter, unspecified: Secondary | ICD-10-CM

## 2016-07-20 DIAGNOSIS — M159 Polyosteoarthritis, unspecified: Secondary | ICD-10-CM

## 2016-07-20 DIAGNOSIS — M150 Primary generalized (osteo)arthritis: Secondary | ICD-10-CM

## 2016-07-20 DIAGNOSIS — Z86718 Personal history of other venous thrombosis and embolism: Secondary | ICD-10-CM

## 2016-07-20 DIAGNOSIS — E1122 Type 2 diabetes mellitus with diabetic chronic kidney disease: Secondary | ICD-10-CM

## 2016-07-20 NOTE — Patient Instructions (Signed)
Today we updated your med list in our EPIC system...    Continue your current medications the same...  We will contact DrBorden's office for a copy of his notes to scan into our EPIC computer system...  Your CXR from [id number] is stable & you remain asymptoatic from the respiratory standpoint!  Keep up the good work w/ DIET & EXERCISE...    Work on [employer] reduction!  Call for any questions...  Let's plan a follow up visit in 50yr, sooner if needed for problems.Marland KitchenMarland Kitchen

## 2016-07-24 ENCOUNTER — Encounter: Payer: Self-pay | Admitting: Pulmonary Disease

## 2016-07-24 NOTE — Progress Notes (Signed)
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 prolonged cough, headaches, hemoptysis, abdominal pain, melena, hematochezia, severe indigestion/heartburn, hematuria, incontinence, muscle weakness, suspicious skin lesions, transient blindness, difficulty walking, depression, unusual weight change, abnormal bleeding, enlarged lymph nodes, and angioedema.     Objective:   Physical Exam     WD, WN, overwt 76 y/o BM in NAD...  GENERAL:  Alert & oriented; pleasant & cooperative... HEENT:  Glide/AT, EOM-wnl, PERRLA, EACs-wax, TMs-wnl, NOSE-clear, THROAT-clear & wnl, no angioedema. NECK:  Supple w/ full ROM; no JVD; normal carotid impulses w/o bruits; no thyromegaly or nodules palpated; no lymphadenopathy. CHEST:  Clear to P & A; without wheezes/ rales/ or rhonchi. HEART:  Regular Rhythm; without murmurs/ rubs/ or gallops. ABDOMEN:  Soft & nontender; normal bowel sounds; no organomegaly or masses detected. RECTAL:  prostate 3+, no nodules, stool heme neg... EXT:  without deformities, mild arthritic changes; no varicose veins/ venous insuffic/ or edema. NEURO:  CN's intact; motor testing normal; sensory testing normal; gait normal & balance OK. DERM:  Keloids noted...  RADIOLOGY DATA:  Reviewed in the EPIC EMR & discussed w/ the patient...  LABORATORY DATA:  Reviewed in the EPIC EMR & discussed w/ the patient...   Assessment & Plan:    Pulm Nodule, min pleural plaque w/o known asbestos exposure, mild obstructive & mod restrictive defects>  Known lesion & calcif plaque right base above diaph seen on old CTs but not readily visible on plain films; he has no real resp symptoms & we are  following...   Medical problems are now managed by DrGolding & DrNida in [place] >>  HBP>  Controlled on BBlocker, CCB; tol well, continue same... Hx DVT>  hx single episode left leg DVT in 2007- Dx in [place] & followed there; Coumadin stopped 2014 & now w/ new right leg DVT on nXarelto... CHOL>  On Prav40 + off Feno; FLP 11/15 was fair, now work on diet & get wt down! DM>  He reports INTOL to Metform w/ itching; onGlimep4mg Qam & Januvia100mg Qpm but control is worse; he doesn't want insulin & additional meds are $$; REC diet, exercise, wt reduction & consult w/ Endocrine DrBalan... Multinodular Goiter> eval & rx from [place] in [place]. GI> GERD> on PPI as needed; had colon 2008, neg & f/u due 2018... GU> BPH w/ open prostatectomy 1999 by DrDavis;  PSA remains WNL; he has remote hx kidney stone; prob of Hematuria & ?abn cytology in office; Cysto, retrogrades & bladder bxs were all neg 2/13 per DrBorden & he continues to follow... DJD/ Gout/ Hx CTS>  Stable on OTC anti-inflamm as needed and Allopurinol  daily... Keloid>  Aware, no change...   Patient's Medications  New Prescriptions   No medications on file  Previous Medications   ACETAMINOPHEN (TYLENOL) 500 MG TABLET    Take 1,000 mg by mouth every 6 (six) hours as needed for moderate pain.   ALLOPURINOL (ZYLOPRIM) 300 MG TABLET    TAKE 1 TABLET BY MOUTH ONCE DAILY   AMLODIPINE (NORVASC) 10 MG TABLET    TAKE 1 TABLET EVERY MORNING   B COMPLEX VITAMINS TABLET    Take 1 tablet by mouth daily.   BD INSULIN SYRINGE ULTRAFINE 31G X 15/64" 1 ML MISC    USE TWICE A DAY AS DIRECTED   DICLOFENAC (VOLTAREN) 75 MG EC TABLET    Take 75 mg by mouth 2 (two) times daily.   FLUTICASONE (FLONASE) 50 MCG/ACT NASAL SPRAY    Place 1 spray into both nostrils daily as needed for allergies or rhinitis.   GABAPENTIN (NEURONTIN) 300 MG CAPSULE    Take 1 capsule (300 mg total) by mouth 3 (three) times daily.   INSULIN NPH ISOPHANE & REGULAR (NOVOLIN 70/30  [place])    Inject 30-40 Units into the skin 2 (two) times daily before a meal. 40 units with breakfast and 30 units with supper   METHOCARBAMOL (ROBAXIN) 500 MG TABLET    Take 1 tablet (500 mg total) by mouth 2 (two) times daily.   METOPROLOL (LOPRESSOR) 50 MG TABLET    TAKE 1 TABLET TWICE DAILY   MULTIPLE VITAMIN (MULITIVITAMIN WITH MINERALS) TABS    Take 1 tablet by mouth daily. Mens One a Day.   OMEGA-3 FATTY ACIDS (FISH OIL PO)    Take 1 capsule by mouth daily.   OMEPRAZOLE (PRILOSEC) 20 MG CAPSULE    TAKE ONE CAPSULE BY MOUTH ONCE DAILY AS NEEDED FOR STOMACH ACID   ONE TOUCH ULTRA TEST TEST STRIP    USE TO TEST BLOOD SUGAR TWICE DAILY.   ONETOUCH DELICA LANCETS 46O MISC    USE TO TEST BLOOD SUGAR TWICE DAILY.   PSEUDOEPHEDRINE-ACETAMINOPHEN (TYLENOL SINUS) 30-500 MG TABS TABLET    Take 1 tablet by mouth every 4 (four) hours as needed.   RIVAROXABAN (XARELTO) 20 MG TABS TABLET    Take 20 mg by mouth daily with supper.   TAMSULOSIN (FLOMAX) 0.4 MG CAPS CAPSULE    Take 0.4 mg by mouth.  Modified Medications   No medications on file  Discontinued Medications   VITAMIN B-12 (CYANOCOBALAMIN) 500 MCG TABLET    Take 500 mcg by mouth daily.

## 2016-08-14 ENCOUNTER — Other Ambulatory Visit: Payer: Self-pay

## 2016-08-14 MED ORDER — INSULIN NPH ISOPHANE & REGULAR (70-30) 100 UNIT/ML ~~LOC~~ SUSP: 40.0000 [IU] | Freq: Two times a day (BID) | SUBCUTANEOUS | 2 refills | Status: DC

## 2016-08-16 DIAGNOSIS — M545 Low back pain: Secondary | ICD-10-CM | POA: Diagnosis not present

## 2016-08-21 ENCOUNTER — Ambulatory Visit (HOSPITAL_COMMUNITY): Payer: PPO | Attending: Orthopedic Surgery | Admitting: Physical Therapy

## 2016-08-21 ENCOUNTER — Encounter (HOSPITAL_COMMUNITY): Payer: Self-pay | Admitting: Physical Therapy

## 2016-08-21 DIAGNOSIS — M62830 Muscle spasm of back: Secondary | ICD-10-CM

## 2016-08-21 DIAGNOSIS — R2689 Other abnormalities of gait and mobility: Secondary | ICD-10-CM

## 2016-08-21 DIAGNOSIS — M545 Low back pain: Secondary | ICD-10-CM | POA: Diagnosis not present

## 2016-08-21 DIAGNOSIS — M6281 Muscle weakness (generalized): Secondary | ICD-10-CM

## 2016-08-21 DIAGNOSIS — M25531 Pain in right wrist: Secondary | ICD-10-CM | POA: Insufficient documentation
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
## 2016-08-24 ENCOUNTER — Encounter (HOSPITAL_COMMUNITY): Payer: Self-pay

## 2016-08-24 ENCOUNTER — Ambulatory Visit (HOSPITAL_COMMUNITY): Payer: PPO

## 2016-08-24 DIAGNOSIS — M62830 Muscle spasm of back: Secondary | ICD-10-CM

## 2016-08-24 DIAGNOSIS — M545 Low back pain: Secondary | ICD-10-CM | POA: Diagnosis not present

## 2016-08-24 DIAGNOSIS — R2689 Other abnormalities of gait and mobility: Secondary | ICD-10-CM

## 2016-08-24 DIAGNOSIS — M6281 Muscle weakness (generalized): Secondary | ICD-10-CM

## 2016-08-24 NOTE — Therapy (Signed)
[facility] [place], [place], [address] Phone: [phone]   Fax:  [phone]  Physical Therapy Treatment  Patient Details  Name: Hector Rose MRN: [id number] Date of Birth: Dec 09, 1940 Referring Provider: Latanya Maudlin, MD  Encounter Date: 08/24/2016      PT End of Session - 08/24/16 0944    Visit Number 2   Number of Visits 13   Authorization Type [employer]    Authorization Time Period 08/21/16 to 10/02/16   PT Start Time 0945   PT Stop Time 1026   PT Time Calculation (min) 41 min   Activity Tolerance Patient tolerated treatment well;No increased pain   Behavior During Therapy WFL for tasks assessed/performed      Past Medical History:  Diagnosis Date  . Asthma 04-11-11   AS CHILD ONLY  . BPH (benign prostatic hypertrophy) with urinary obstruction   . Carpal tunnel syndrome   . Difficult intubation 04-11-11   08-11-97 some issues with intubation/record with chart  . DJD (degenerative joint disease)   . DM (diabetes mellitus) ([place]) 04-11-11   dx. 8-10 yrs. -oral meds ,cbg's(120-150)  . DVT femoral (deep venous thrombosis) with thrombophlebitis ([place]) 04-11-11   ?'09-tx. warfarin, pt being bridge with Lovenox 30mg  x2daily,x 5days  . GERD (gastroesophageal reflux disease) 04-11-11   mild, no med in 1 month  . Gout 04-11-11    ankle 2 yrs ago  . Hemorrhoids   . Hypercholesteremia   . Hypertension   . Internal hemorrhoids   . Keloid 04-11-11   multiple-arms,back, chest  . Pulmonary nodule 04-11-11   "PT. NOT AWARE" -denies problems with breathing  . Renal calculus   . Thyroid disease    hypothyroid    Past Surgical History:  Procedure Laterality Date  . cataract surgery  12/2013  . COSMETIC SURGERY  2000   Keloid injections  . CYSTOSCOPY WITH BIOPSY  04/14/2011   Procedure: CYSTOSCOPY WITH BIOPSY;  Surgeon: Dutch Gray, MD;  Location: [facility];  Service: Urology;  Laterality: N/A;  Bladder Biopsies   .  CYSTOSCOPY/RETROGRADE/URETEROSCOPY  04/14/2011   Procedure: CYSTOSCOPY/RETROGRADE/URETEROSCOPY;  Surgeon: Dutch Gray, MD;  Location: [facility];  Service: Urology;  Laterality: Bilateral;  (Bil) RPG   . ortho surgery on fingers  2005 & 2008   Dr. Lorelle Formosa finger release  . PROSTATECTOMY  1999   Dr. Rosana Hoes    There were no vitals filed for this visit.      Subjective Assessment - 08/24/16 0944    Subjective Pt states that he feels pretty good today. He states he went to the [facility] and worked out, which went well. He is just a little sore. He felt better following his initial evaluation.   Pertinent History BPH, DJD, GERD, HTN, pulmonary nodule, thyroid disease   How long can you sit comfortably? unlimited    How long can you stand comfortably? sometimes bothers him in church if they have to stand for a while    How long can you walk comfortably? mostly unlimited unless he does alot of stooping    Diagnostic tests Xray/MRI: arthritic changes    Currently in Pain? No/denies             [facility] Adult PT Treatment/Exercise - 08/24/16 0001      Lumbar Exercises: Stretches   Active Hamstring Stretch 2 reps;30 seconds   Active Hamstring Stretch Limitations BLE, supine with sheet   Quad Stretch 2 reps;30 seconds   Sports administrator  Limitations BLE, prone with sheet     Lumbar Exercises: Supine   Other Supine Lumbar Exercises log rolling x 5 reps     Manual Therapy   Manual Therapy Joint mobilization;Soft tissue mobilization   Manual therapy comments manual completed separate rest of treatment   Joint Mobilization Grade III/IV CPAs T4-L5, 20-30 sec bouts at each segment x 2RT   Soft tissue mobilization IASTM with ball to L lumbar paraspinals to decrease soft tissue restrictions             PT Education - 08/24/16 1026    Education provided Yes   Education Details reviewed goals with no f/u questions, log rolling technique, sitting with lumbar roll, added stretching to HEP    Person(s) Educated Patient   Methods Explanation;Demonstration;Handout   Comprehension Verbalized understanding;Returned demonstration          PT Short Term Goals - 08/21/16 1224      PT SHORT TERM GOAL #1   Title Pt will demo consistency and independence with his HEP to improve lumbar mobility and pain.    Time 3   Period Weeks   Status New     PT SHORT TERM GOAL #2   Title Pt will demo correct set up and use of lumbar roll to improve his sitting posture and decrease pain with transition to stand.    Time 3   Period Weeks   Status New     PT SHORT TERM GOAL #3   Title Pt will demo improved body mechanics evident by his ability to perform log roll technique during his sessions, with no more than MinA from the therapist.    Time 3   Period Weeks   Status New           PT Long Term Goals - 08/21/16 1226      PT LONG TERM GOAL #1   Title Pt will report atleast 50% improvement in his pain and symptoms since the start of therapy, to increase his activity tolerance and quality of life.    Time 6   Period Weeks   Status New     PT LONG TERM GOAL #2   Title Pt will demo improvement in BLE strength to atleast 4+/5 MMT, which will increase his safety with functional activity.    Time 6   Period Weeks   Status New     PT LONG TERM GOAL #3   Title Pt will demo improved hamstring flexibility, evident by atleast a 10 deg improvement in 90/90 tasting, which will improve his sitting and standing posture throughout the day.    Time 6   Period Weeks   Status New     PT LONG TERM GOAL #4   Title Pt will complete 5x sit to stand in less than 13 sec, without UE support, to demonstrate and improvement in his functional strength and power.   Time 6   Period Weeks   Status New               Plan - 08/24/16 1029    Clinical Impression Statement Reviewed pt's goals and issued copy of eval with no follow up questions. Began session with CPAs and IASTM to L lumbar  paraspinals which he tolerated well. He continues to have significant hypomobility throughout thoracic and lumbar spine. Educated on log rolling which required mod cueing for proper technique but pt able to demonstrate it properly by EOS. Added quad and HS stretching to HEP  to improve flexibility. Continue POC as planned.   Rehab Potential Good   PT Frequency 2x / week   PT Duration 6 weeks   PT Treatment/Interventions Moist Heat;Gait training;Neuromuscular re-education;Balance training;Therapeutic exercise;Therapeutic activities;Functional mobility training;Stair training;Patient/family education;Manual techniques;Dry needling   PT Next Visit Plan continue HS and quad stretching, manual to lumbar paraspinals, CPAs to lumbar and thoracic spine if with PT, add thoracic excursions, thoracic ext over foam roll in sitting or supine, review log rolling technique; hip strengthening   PT Home Exercise Plan supine low trunk rotation, BLE bridge, sidelying clams with red TB, thoracic extension over chair; 6/14: quad and HS stretch   Consulted and Agree with Plan of Care Patient      Patient will benefit from skilled therapeutic intervention in order to improve the following deficits and impairments:  Improper body mechanics, Pain, Postural dysfunction, Increased muscle spasms, Decreased mobility, Decreased strength, Decreased range of motion, Decreased endurance, Impaired flexibility  Visit Diagnosis: Midline low back pain, unspecified chronicity, with sciatica presence unspecified  Muscle weakness (generalized)  Other abnormalities of gait and mobility  Muscle spasm of back     Problem List Patient Active Problem List   Diagnosis Date Noted  . History of DVT (deep vein thrombosis) 07/20/2016  . Renal lesion 07/20/2016  . Type 2 diabetes mellitus with stage 2 chronic kidney disease ([place]) 01/15/2014  . Chronic idiopathic gout of multiple sites 01/15/2014  . Calcified pleural plaque on chest  x-ray 06/30/2013  . Tinea pedis 09/28/2011  . Hematuria 05/19/2011  . HEMORRHOIDS 05/29/2007  . Pulmonary nodule 03/27/2007  . GERD 03/27/2007  . Benign prostatic hyperplasia with urinary obstruction 03/27/2007  . KELOID 03/27/2007  . Hyperlipidemia 01/24/2007  . CARPAL TUNNEL SYNDROME 01/24/2007  . Essential hypertension 01/24/2007  . RENAL CALCULUS 01/24/2007  . Osteoarthritis 01/24/2007     Geraldine Solar PT, DPT   [facility] [address] [place], [place], [address] Phone: [phone]   Fax:  [phone]  Name: Hector Rose MRN: [id number] Date of Birth: 12/01/1940

## 2016-08-24 NOTE — Patient Instructions (Signed)
  Prone Quad Stretch  Lie down flat on your stomach. Wrap a strap (belt, towel, dog leash) around the top of one of your feet and pull the strap across your opposite shoulder so that your knee starts to curl up to your body. Pull until a stretch is felt across the front of your thigh.     Perform 2x/day, 3-5 stretches on each leg holding for 30 seconds each   HAMSTRING STRETCH WITH TOWEL  While lying down on your back, hook a towel or strap under  your foot and draw up your leg until a stretch is felt under your leg. calf area.  Keep your knee in a straightened position during the stretch.  Perform 2x/day, 3-5 stretches on each leg holding for 30 seconds each

## 2016-08-28 ENCOUNTER — Ambulatory Visit (HOSPITAL_COMMUNITY): Payer: PPO | Admitting: Physical Therapy

## 2016-08-28 DIAGNOSIS — M6281 Muscle weakness (generalized): Secondary | ICD-10-CM

## 2016-08-28 DIAGNOSIS — M25531 Pain in right wrist: Secondary | ICD-10-CM

## 2016-08-28 DIAGNOSIS — M545 Low back pain: Secondary | ICD-10-CM | POA: Diagnosis not present

## 2016-08-28 DIAGNOSIS — M62830 Muscle spasm of back: Secondary | ICD-10-CM

## 2016-08-28 DIAGNOSIS — R2689 Other abnormalities of gait and mobility: Secondary | ICD-10-CM

## 2016-08-28 NOTE — Patient Instructions (Addendum)
Flexibility: Neck Retraction    Pull head straight back, keeping eyes and jaw level. Repeat _10___ times per set. Do __1__ sets per session. Do 3____ sessions per day.  http://orth.exer.us/344   Copyright  VHI. All rights reserved.  Scapular Retraction (Standing)    With arms at sides, pinch shoulder blades together. Repeat _10___ times per set. Do ___2_ sets per session. Do ___3_ sessions per day.  http://orth.exer.us/944   Copyright  VHI. All rights reserved.  On Elbows (Prone)    Rise up on elbows as high as possible, keeping hips on floor. Hold _60___ seconds. Repeat ___2_ times per set. Do __1__ sets per session. Do __3_ sessions per day.  http://orth.exer.us/92   Copyright  VHI. All rights reserved.

## 2016-08-28 NOTE — Therapy (Signed)
[facility] [place], [place], [address] Phone: [phone]   Fax:  [phone]  Physical Therapy Treatment  Patient Details  Name: Hector Rose MRN: [id number] Date of Birth: December 16, 1940 Referring Provider: Latanya Maudlin, MD  Encounter Date: 08/28/2016      PT End of Session - 08/28/16 1108    Visit Number 3   Number of Visits 13   Authorization Type [employer]    Authorization Time Period 08/21/16 to 10/02/16   Authorization - Visit Number 3   Authorization - Number of Visits 10   PT Start Time 0947   PT Stop Time 1115   PT Time Calculation (min) 39 min   Activity Tolerance Patient tolerated treatment well;No increased pain   Behavior During Therapy WFL for tasks assessed/performed      Past Medical History:  Diagnosis Date  . Asthma 04-11-11   AS CHILD ONLY  . BPH (benign prostatic hypertrophy) with urinary obstruction   . Carpal tunnel syndrome   . Difficult intubation 04-11-11   08-11-97 some issues with intubation/record with chart  . DJD (degenerative joint disease)   . DM (diabetes mellitus) ([place]) 04-11-11   dx. 8-10 yrs. -oral meds ,cbg's(120-150)  . DVT femoral (deep venous thrombosis) with thrombophlebitis ([place]) 04-11-11   ?'09-tx. warfarin, pt being bridge with Lovenox 30mg  x2daily,x 5days  . GERD (gastroesophageal reflux disease) 04-11-11   mild, no med in 1 month  . Gout 04-11-11    ankle 2 yrs ago  . Hemorrhoids   . Hypercholesteremia   . Hypertension   . Internal hemorrhoids   . Keloid 04-11-11   multiple-arms,back, chest  . Pulmonary nodule 04-11-11   "PT. NOT AWARE" -denies problems with breathing  . Renal calculus   . Thyroid disease    hypothyroid    Past Surgical History:  Procedure Laterality Date  . cataract surgery  12/2013  . COSMETIC SURGERY  2000   Keloid injections  . CYSTOSCOPY WITH BIOPSY  04/14/2011   Procedure: CYSTOSCOPY WITH BIOPSY;  Surgeon: Dutch Gray, MD;   Location: [facility];  Service: Urology;  Laterality: N/A;  Bladder Biopsies   . CYSTOSCOPY/RETROGRADE/URETEROSCOPY  04/14/2011   Procedure: CYSTOSCOPY/RETROGRADE/URETEROSCOPY;  Surgeon: Dutch Gray, MD;  Location: [facility];  Service: Urology;  Laterality: Bilateral;  ([place]) RPG   . ortho surgery on fingers  2005 & 2008   Dr. Lorelle Formosa finger release  . PROSTATECTOMY  1999   Dr. Rosana Hoes    There were no vitals filed for this visit.      Subjective Assessment - 08/28/16 1039    Subjective Pt states that he is doing some but not all of his exercises    Pertinent History BPH, DJD, GERD, HTN, pulmonary nodule, thyroid disease   How long can you sit comfortably? unlimited    How long can you stand comfortably? sometimes bothers him in church if they have to stand for a while    How long can you walk comfortably? mostly unlimited unless he does alot of stooping    Diagnostic tests Xray/MRI: arthritic changes    Currently in Pain? No/denies                         [facility] Adult PT Treatment/Exercise - 08/28/16 0001      Exercises   Exercises Lumbar     Lumbar Exercises: Stretches   Active Hamstring Stretch 3 reps;30 seconds   Prone on Elbows  Stretch 3 reps;30 seconds   Quad Stretch 3 reps;30 seconds     Lumbar Exercises: Standing   Other Standing Lumbar Exercises back against wall cervical retracion x 5   Other Standing Lumbar Exercises facing wall wall arch and Y x 5      Lumbar Exercises: Seated   Other Seated Lumbar Exercises thoracic extension mob over chair, T9 to T5 x5-6 reps each  HEP demo    Other Seated Lumbar Exercises scapular retraction x 10                  PT Short Term Goals - 08/21/16 1224      PT SHORT TERM GOAL #1   Title Pt will demo consistency and independence with his HEP to improve lumbar mobility and pain.    Time 3   Period Weeks   Status New     PT SHORT TERM GOAL #2   Title Pt will demo correct set up and use of lumbar roll  to improve his sitting posture and decrease pain with transition to stand.    Time 3   Period Weeks   Status New     PT SHORT TERM GOAL #3   Title Pt will demo improved body mechanics evident by his ability to perform log roll technique during his sessions, with no more than MinA from the therapist.    Time 3   Period Weeks   Status New           PT Long Term Goals - 08/21/16 1226      PT LONG TERM GOAL #1   Title Pt will report atleast 50% improvement in his pain and symptoms since the start of therapy, to increase his activity tolerance and quality of life.    Time 6   Period Weeks   Status New     PT LONG TERM GOAL #2   Title Pt will demo improvement in BLE strength to atleast 4+/5 MMT, which will increase his safety with functional activity.    Time 6   Period Weeks   Status New     PT LONG TERM GOAL #3   Title Pt will demo improved hamstring flexibility, evident by atleast a 10 deg improvement in 90/90 tasting, which will improve his sitting and standing posture throughout the day.    Time 6   Period Weeks   Status New     PT LONG TERM GOAL #4   Title Pt will complete 5x sit to stand in less than 13 sec, without UE support, to demonstrate and improvement in his functional strength and power.   Time 6   Period Weeks   Status New               Plan - 08/28/16 1109    Clinical Impression Statement Added standing postural exercises with good form.  Pt continues to have significant tightness in B paraspinal mm.  Explained at length the importance of trying to improve pt posture to decrease pain not only in low back but neck, shoulders, hips and knees.    Rehab Potential Good   PT Frequency 2x / week   PT Duration 6 weeks   PT Treatment/Interventions Moist Heat;Gait training;Neuromuscular re-education;Balance training;Therapeutic exercise;Therapeutic activities;Functional mobility training;Stair training;Patient/family education;Manual techniques;Dry needling    PT Next Visit Plan begin prone B extension , Y's and T's    PT Home Exercise Plan supine low trunk rotation, BLE bridge, sidelying clams with red TB, thoracic  extension over chair; 6/14: quad and HS stretch   Consulted and Agree with Plan of Care Patient      Patient will benefit from skilled therapeutic intervention in order to improve the following deficits and impairments:  Improper body mechanics, Pain, Postural dysfunction, Increased muscle spasms, Decreased mobility, Decreased strength, Decreased range of motion, Decreased endurance, Impaired flexibility  Visit Diagnosis: Midline low back pain, unspecified chronicity, with sciatica presence unspecified  Muscle weakness (generalized)  Other abnormalities of gait and mobility  Muscle spasm of back  Pain in right wrist     Problem List Patient Active Problem List   Diagnosis Date Noted  . History of DVT (deep vein thrombosis) 07/20/2016  . Renal lesion 07/20/2016  . Type 2 diabetes mellitus with stage 2 chronic kidney disease ([place]) 01/15/2014  . Chronic idiopathic gout of multiple sites 01/15/2014  . Calcified pleural plaque on chest x-ray 06/30/2013  . Tinea pedis 09/28/2011  . Hematuria 05/19/2011  . HEMORRHOIDS 05/29/2007  . Pulmonary nodule 03/27/2007  . GERD 03/27/2007  . Benign prostatic hyperplasia with urinary obstruction 03/27/2007  . KELOID 03/27/2007  . Hyperlipidemia 01/24/2007  . CARPAL TUNNEL SYNDROME 01/24/2007  . Essential hypertension 01/24/2007  . RENAL CALCULUS 01/24/2007  . Osteoarthritis 01/24/2007  Rayetta Humphrey, PT CLT [phone] 08/28/2016, 11:17 AM  [place], [place], [address] Phone: [phone]   Fax:  [phone]  Name: Victorino Fatzinger MRN: [id number] Date of Birth: [birth year]

## 2016-08-29 ENCOUNTER — Encounter (HOSPITAL_COMMUNITY): Payer: PPO

## 2016-08-31 ENCOUNTER — Ambulatory Visit (HOSPITAL_COMMUNITY): Payer: PPO

## 2016-08-31 DIAGNOSIS — M545 Low back pain: Secondary | ICD-10-CM | POA: Diagnosis not present

## 2016-08-31 DIAGNOSIS — M62830 Muscle spasm of back: Secondary | ICD-10-CM

## 2016-08-31 DIAGNOSIS — M6281 Muscle weakness (generalized): Secondary | ICD-10-CM

## 2016-08-31 DIAGNOSIS — R2689 Other abnormalities of gait and mobility: Secondary | ICD-10-CM

## 2016-08-31 NOTE — Therapy (Signed)
[place], [place], [address] Phone: [phone]   Fax:  [phone]  Physical Therapy Treatment  Patient Details  Name: Hector Rose MRN: [id number] Date of Birth: February 24, 1941 Referring Provider: Latanya Maudlin, MD  Encounter Date: 08/31/2016      PT End of Session - 08/31/16 1040    Visit Number 4   Number of Visits 13   Date for PT Re-Evaluation 09/18/16   Authorization Type [employer]    Authorization Time Period 08/21/16 to 10/02/16   Authorization - Visit Number 4   Authorization - Number of Visits 10   PT Start Time 3664   PT Stop Time 1118   PT Time Calculation (min) 41 min   Activity Tolerance Patient tolerated treatment well;No increased pain   Behavior During Therapy WFL for tasks assessed/performed      Past Medical History:  Diagnosis Date  . Asthma 04-11-11   AS CHILD ONLY  . BPH (benign prostatic hypertrophy) with urinary obstruction   . Carpal tunnel syndrome   . Difficult intubation 04-11-11   08-11-97 some issues with intubation/record with chart  . DJD (degenerative joint disease)   . DM (diabetes mellitus) ([place]) 04-11-11   dx. 8-10 yrs. -oral meds ,cbg's(120-150)  . DVT femoral (deep venous thrombosis) with thrombophlebitis ([place]) 04-11-11   ?'09-tx. warfarin, pt being bridge with Lovenox 30mg  x2daily,x 5days  . GERD (gastroesophageal reflux disease) 04-11-11   mild, no med in 1 month  . Gout 04-11-11    ankle 2 yrs ago  . Hemorrhoids   . Hypercholesteremia   . Hypertension   . Internal hemorrhoids   . Keloid 04-11-11   multiple-arms,back, chest  . Pulmonary nodule 04-11-11   "PT. NOT AWARE" -denies problems with breathing  . Renal calculus   . Thyroid disease    hypothyroid    Past Surgical History:  Procedure Laterality Date  . cataract surgery  12/2013  . COSMETIC SURGERY  2000   Keloid injections  . CYSTOSCOPY WITH BIOPSY  04/14/2011   Procedure: CYSTOSCOPY WITH  BIOPSY;  Surgeon: Dutch Gray, MD;  Location: [facility];  Service: Urology;  Laterality: N/A;  Bladder Biopsies   . CYSTOSCOPY/RETROGRADE/URETEROSCOPY  04/14/2011   Procedure: CYSTOSCOPY/RETROGRADE/URETEROSCOPY;  Surgeon: Dutch Gray, MD;  Location: [facility];  Service: Urology;  Laterality: Bilateral;  (Bil) RPG   . ortho surgery on fingers  2005 & 2008   Dr. Lorelle Formosa finger release  . PROSTATECTOMY  1999   Dr. Rosana Hoes    There were no vitals filed for this visit.      Subjective Assessment - 08/31/16 1038    Subjective Pt stated he is feeling good today, reoprts complaince with HEP, does c/o of some tightness when initially stands from sitting   Pertinent History BPH, DJD, GERD, HTN, pulmonary nodule, thyroid disease   Currently in Pain? No/denies                         [facility] Adult PT Treatment/Exercise - 08/31/16 0001      Lumbar Exercises: Stretches   Active Hamstring Stretch 3 reps;30 seconds   Active Hamstring Stretch Limitations BLE, supine with sheet   Prone on Elbows Stretch Limitations 2 minute   Quad Stretch 3 reps;30 seconds   Quad Stretch Limitations BLE, prone with sheet     Lumbar Exercises: Standing   Other Standing Lumbar Exercises back against wall cervical retracion x 5  Other Standing Lumbar Exercises facing wall wall arch and Y x 5      Lumbar Exercises: Seated   Other Seated Lumbar Exercises thoracic excursion   Other Seated Lumbar Exercises cervical and scapular retration, wback 10x     Lumbar Exercises: Prone   Other Prone Lumbar Exercises Y (uni), T (BUE) and prone extensi on 10x (AAROM with Y)                  PT Short Term Goals - 08/21/16 1224      PT SHORT TERM GOAL #1   Title Pt will demo consistency and independence with his HEP to improve lumbar mobility and pain.    Time 3   Period Weeks   Status New     PT SHORT TERM GOAL #2   Title Pt will demo correct set up and use of lumbar roll to improve his sitting  posture and decrease pain with transition to stand.    Time 3   Period Weeks   Status New     PT SHORT TERM GOAL #3   Title Pt will demo improved body mechanics evident by his ability to perform log roll technique during his sessions, with no more than MinA from the therapist.    Time 3   Period Weeks   Status New           PT Long Term Goals - 08/21/16 1226      PT LONG TERM GOAL #1   Title Pt will report atleast 50% improvement in his pain and symptoms since the start of therapy, to increase his activity tolerance and quality of life.    Time 6   Period Weeks   Status New     PT LONG TERM GOAL #2   Title Pt will demo improvement in BLE strength to atleast 4+/5 MMT, which will increase his safety with functional activity.    Time 6   Period Weeks   Status New     PT LONG TERM GOAL #3   Title Pt will demo improved hamstring flexibility, evident by atleast a 10 deg improvement in 90/90 tasting, which will improve his sitting and standing posture throughout the day.    Time 6   Period Weeks   Status New     PT LONG TERM GOAL #4   Title Pt will complete 5x sit to stand in less than 13 sec, without UE support, to demonstrate and improvement in his functional strength and power.   Time 6   Period Weeks   Status New               Plan - 08/31/16 1153    Clinical Impression Statement Progressed postural strengthening exercises to prone position.  Pt presents with moderate difficulty requiring verbal cueing for technique and AAROM with Y movements due to tight pecs and weak postural musculature.  Reviewed importance of improving seated and standing posture to maximal beneftis.  No reports of pain through session.     Rehab Potential Good   PT Frequency 2x / week   PT Duration 6 weeks   PT Treatment/Interventions Moist Heat;Gait training;Neuromuscular re-education;Balance training;Therapeutic exercise;Therapeutic activities;Functional mobility training;Stair  training;Patient/family education;Manual techniques;Dry needling   PT Next Visit Plan Continue with prone posture strengthening; BUE extension , Y's and T's; add pec stretch next session        Patient will benefit from skilled therapeutic intervention in order to improve the following deficits and impairments:  Improper body mechanics, Pain, Postural dysfunction, Increased muscle spasms, Decreased mobility, Decreased strength, Decreased range of motion, Decreased endurance, Impaired flexibility  Visit Diagnosis: Midline low back pain, unspecified chronicity, with sciatica presence unspecified  Muscle weakness (generalized)  Other abnormalities of gait and mobility  Muscle spasm of back     Problem List Patient Active Problem List   Diagnosis Date Noted  . History of DVT (deep vein thrombosis) 07/20/2016  . Renal lesion 07/20/2016  . Type 2 diabetes mellitus with stage 2 chronic kidney disease ([place]) 01/15/2014  . Chronic idiopathic gout of multiple sites 01/15/2014  . Calcified pleural plaque on chest x-ray 06/30/2013  . Tinea pedis 09/28/2011  . Hematuria 05/19/2011  . HEMORRHOIDS 05/29/2007  . Pulmonary nodule 03/27/2007  . GERD 03/27/2007  . Benign prostatic hyperplasia with urinary obstruction 03/27/2007  . KELOID 03/27/2007  . Hyperlipidemia 01/24/2007  . CARPAL TUNNEL SYNDROME 01/24/2007  . Essential hypertension 01/24/2007  . RENAL CALCULUS 01/24/2007  . Osteoarthritis 01/24/2007   Ihor Austin, [place]; [place]  Aldona Lento 08/31/2016, 11:58 AM  [facility] [address] [place], [place], [address] Phone: [phone]   Fax:  [phone]  Name: Hector Rose MRN: [id number] Date of Birth: [birth year]

## 2016-09-01 ENCOUNTER — Other Ambulatory Visit (HOSPITAL_COMMUNITY): Payer: Self-pay | Admitting: Urology

## 2016-09-01 DIAGNOSIS — R3912 Poor urinary stream: Secondary | ICD-10-CM | POA: Diagnosis not present

## 2016-09-01 DIAGNOSIS — N401 Enlarged prostate with lower urinary tract symptoms: Secondary | ICD-10-CM | POA: Diagnosis not present

## 2016-09-01 DIAGNOSIS — D49511 Neoplasm of unspecified behavior of right kidney: Secondary | ICD-10-CM

## 2016-09-04 ENCOUNTER — Ambulatory Visit (HOSPITAL_COMMUNITY): Payer: PPO

## 2016-09-04 ENCOUNTER — Encounter (HOSPITAL_COMMUNITY): Payer: Self-pay

## 2016-09-04 DIAGNOSIS — M545 Low back pain: Secondary | ICD-10-CM | POA: Diagnosis not present

## 2016-09-04 DIAGNOSIS — R2689 Other abnormalities of gait and mobility: Secondary | ICD-10-CM

## 2016-09-04 DIAGNOSIS — M6281 Muscle weakness (generalized): Secondary | ICD-10-CM

## 2016-09-04 DIAGNOSIS — M62830 Muscle spasm of back: Secondary | ICD-10-CM

## 2016-09-04 NOTE — Patient Instructions (Signed)
  Doorway Pec Stretch  Place hands on either side of doorway as shown. Step one foot forward and gently lean chest forward to feel a mild-moderate sense of stretch across the chest.  Perform at least 1x/day, 3-5 stretches holding for 30 seconds each

## 2016-09-04 NOTE — Therapy (Signed)
[place], [place], [address] Phone: [phone]   Fax:  [phone]  Physical Therapy Treatment  Patient Details  Name: Hector Rose MRN: [id number] Date of Birth: 1940/11/02 Referring Provider: Latanya Maudlin, MD  Encounter Date: 09/04/2016      PT End of Session - 09/04/16 0939    Visit Number 5   Number of Visits 13   Date for PT Re-Evaluation 09/18/16   Authorization Type [employer]    Authorization Time Period 08/21/16 to 10/02/16   Authorization - Visit Number 5   Authorization - Number of Visits 10   PT Start Time 0940   PT Stop Time 1022   PT Time Calculation (min) 42 min   Activity Tolerance Patient tolerated treatment well;No increased pain   Behavior During Therapy WFL for tasks assessed/performed      Past Medical History:  Diagnosis Date  . Asthma 04-11-11   AS CHILD ONLY  . BPH (benign prostatic hypertrophy) with urinary obstruction   . Carpal tunnel syndrome   . Difficult intubation 04-11-11   08-11-97 some issues with intubation/record with chart  . DJD (degenerative joint disease)   . DM (diabetes mellitus) ([place]) 04-11-11   dx. 8-10 yrs. -oral meds ,cbg's(120-150)  . DVT femoral (deep venous thrombosis) with thrombophlebitis ([place]) 04-11-11   ?'09-tx. warfarin, pt being bridge with Lovenox 30mg  x2daily,x 5days  . GERD (gastroesophageal reflux disease) 04-11-11   mild, no med in 1 month  . Gout 04-11-11    ankle 2 yrs ago  . Hemorrhoids   . Hypercholesteremia   . Hypertension   . Internal hemorrhoids   . Keloid 04-11-11   multiple-arms,back, chest  . Pulmonary nodule 04-11-11   "PT. NOT AWARE" -denies problems with breathing  . Renal calculus   . Thyroid disease    hypothyroid    Past Surgical History:  Procedure Laterality Date  . cataract surgery  12/2013  . COSMETIC SURGERY  2000   Keloid injections  . CYSTOSCOPY WITH BIOPSY  04/14/2011   Procedure: CYSTOSCOPY WITH  BIOPSY;  Surgeon: Dutch Gray, MD;  [facility];  Service: Urology;  Laterality: N/A;  Bladder Biopsies   . CYSTOSCOPY/RETROGRADE/URETEROSCOPY  04/14/2011   Procedure: CYSTOSCOPY/RETROGRADE/URETEROSCOPY;  Surgeon: Dutch Gray, MD;  [facility]: [facility];  Service: Urology;  Laterality: Bilateral;  (Bil) RPG   . ortho surgery on fingers  2005 & 2008   Dr. Lorelle Formosa finger release  . PROSTATECTOMY  1999   Dr. Rosana Hoes    There were no vitals filed for this visit.      Subjective Assessment - 09/04/16 0940    Subjective Pt states that his back feels a lot better. His wrist is bothering him a little bit today. He states he was sore following last session.    Pertinent History BPH, DJD, GERD, HTN, pulmonary nodule, thyroid disease   How long can you sit comfortably? unlimited    How long can you stand comfortably? sometimes bothers him in church if they have to stand for a while    How long can you walk comfortably? mostly unlimited unless he does alot of stooping    Diagnostic tests Xray/MRI: arthritic changes    Currently in Pain? No/denies              [facility] Adult PT Treatment/Exercise - 09/04/16 0001      Lumbar Exercises: Stretches   Active Hamstring Stretch 3 reps;30 seconds   Active Hamstring Stretch  Limitations BLE, supine with rope   Prone on Elbows Stretch Limitations 2 minute   Prone Mid Back Stretch Limitations pec stretch in doorway 3 x 30 sec   Quad Stretch 3 reps;30 seconds   Quad Stretch Limitations BLE, prone with rope     Lumbar Exercises: Standing   Other Standing Lumbar Exercises Y's on wall with liftoff 2 sets x 10 reps     Lumbar Exercises: Seated   Sit to Stand 10 reps   Sit to Stand Limitations without hands, cues to improve weight shift over RLE   Other Seated Lumbar Exercises thoracic extension over 1/2 foam roll T9 to T5 x 5 reps at each segment     Lumbar Exercises: Prone   Other Prone Lumbar Exercises bil Y's, T's, and rowing (with 3# DB) x 10  reps each (increased difficulty with LUE throughout)               PT Short Term Goals - 08/21/16 1224      PT SHORT TERM GOAL #1   Title Pt will demo consistency and independence with his HEP to improve lumbar mobility and pain.    Time 3   Period Weeks   Status New     PT SHORT TERM GOAL #2   Title Pt will demo correct set up and use of lumbar roll to improve his sitting posture and decrease pain with transition to stand.    Time 3   Period Weeks   Status New     PT SHORT TERM GOAL #3   Title Pt will demo improved body mechanics evident by his ability to perform log roll technique during his sessions, with no more than MinA from the therapist.    Time 3   Period Weeks   Status New           PT Long Term Goals - 08/21/16 1226      PT LONG TERM GOAL #1   Title Pt will report atleast 50% improvement in his pain and symptoms since the start of therapy, to increase his activity tolerance and quality of life.    Time 6   Period Weeks   Status New     PT LONG TERM GOAL #2   Title Pt will demo improvement in BLE strength to atleast 4+/5 MMT, which will increase his safety with functional activity.    Time 6   Period Weeks   Status New     PT LONG TERM GOAL #3   Title Pt will demo improved hamstring flexibility, evident by atleast a 10 deg improvement in 90/90 tasting, which will improve his sitting and standing posture throughout the day.    Time 6   Period Weeks   Status New     PT LONG TERM GOAL #4   Title Pt will complete 5x sit to stand in less than 13 sec, without UE support, to demonstrate and improvement in his functional strength and power.   Time 6   Period Weeks   Status New               Plan - 09/04/16 1023    Clinical Impression Statement Session continue to focus on improving flexibility and posture/postural strength. He required min cues for proper exercise technique and he continued to demonstrate increased weakness/difficulty with  prone postural strengthening, but he did not need assistance with Y's this date. Pt did not c/o pain throughout duration of session, just fatigue and he  reported feeling looser at EOS. Continue POC as planned.   Rehab Potential Good   PT Frequency 2x / week   PT Duration 6 weeks   PT Treatment/Interventions Moist Heat;Gait training;Neuromuscular re-education;Balance training;Therapeutic exercise;Therapeutic activities;Functional mobility training;Stair training;Patient/family education;Manual techniques;Dry needling   PT Next Visit Plan Continue with prone posture strengthening; BUE extension , Y's and T's; continue pec stretch; add in sidelying thoracic rotation and perform thoracic ext over 1/2 foam roll in supine    PT Home Exercise Plan supine low trunk rotation, BLE bridge, sidelying clams with red TB, thoracic extension over chair; 6/14: quad and HS stretch; 6/25: pec stretch in doorway   Consulted and Agree with Plan of Care Patient      Patient will benefit from skilled therapeutic intervention in order to improve the following deficits and impairments:  Improper body mechanics, Pain, Postural dysfunction, Increased muscle spasms, Decreased mobility, Decreased strength, Decreased range of motion, Decreased endurance, Impaired flexibility  Visit Diagnosis: Midline low back pain, unspecified chronicity, with sciatica presence unspecified  Muscle weakness (generalized)  Other abnormalities of gait and mobility  Muscle spasm of back     Problem List Patient Active Problem List   Diagnosis Date Noted  . History of DVT (deep vein thrombosis) 07/20/2016  . Renal lesion 07/20/2016  . Type 2 diabetes mellitus with stage 2 chronic kidney disease ([place]) 01/15/2014  . Chronic idiopathic gout of multiple sites 01/15/2014  . Calcified pleural plaque on chest x-ray 06/30/2013  . Tinea pedis 09/28/2011  . Hematuria 05/19/2011  . HEMORRHOIDS 05/29/2007  . Pulmonary nodule 03/27/2007  .  GERD 03/27/2007  . Benign prostatic hyperplasia with urinary obstruction 03/27/2007  . KELOID 03/27/2007  . Hyperlipidemia 01/24/2007  . CARPAL TUNNEL SYNDROME 01/24/2007  . Essential hypertension 01/24/2007  . RENAL CALCULUS 01/24/2007  . Osteoarthritis 01/24/2007    Geraldine Solar PT, DPT   [facility] [address] [place], [place], [address] Phone: [phone]   Fax:  [phone]  Name: Isham Smitherman MRN: [id number] Date of Birth: 01-02-1941

## 2016-09-06 ENCOUNTER — Encounter: Payer: Self-pay | Admitting: Gastroenterology

## 2016-09-06 ENCOUNTER — Ambulatory Visit (HOSPITAL_COMMUNITY)
Admission: RE | Admit: 2016-09-06 | Discharge: 2016-09-06 | Disposition: A | Discharge status: Home / Self Care | Payer: PPO | Source: Ambulatory Visit | Attending: Urology | Admitting: Urology

## 2016-09-06 DIAGNOSIS — M5136 Other intervertebral disc degeneration, lumbar region: Secondary | ICD-10-CM | POA: Insufficient documentation

## 2016-09-06 DIAGNOSIS — R911 Solitary pulmonary nodule: Secondary | ICD-10-CM | POA: Diagnosis not present

## 2016-09-06 DIAGNOSIS — N281 Cyst of kidney, acquired: Secondary | ICD-10-CM | POA: Diagnosis not present

## 2016-09-06 DIAGNOSIS — J948 Other specified pleural conditions: Secondary | ICD-10-CM | POA: Insufficient documentation

## 2016-09-06 DIAGNOSIS — D49511 Neoplasm of unspecified behavior of right kidney: Secondary | ICD-10-CM | POA: Insufficient documentation

## 2016-09-06 DIAGNOSIS — M47816 Spondylosis without myelopathy or radiculopathy, lumbar region: Secondary | ICD-10-CM | POA: Diagnosis not present

## 2016-09-06 LAB — POCT I-STAT CREATININE: Creatinine, Ser: 1.3 mg/dL — ABNORMAL HIGH (ref 0.61–1.24)

## 2016-09-06 MED ORDER — GADOBENATE DIMEGLUMINE 529 MG/ML IV SOLN
20.0000 mL | Freq: Once | INTRAVENOUS | Status: AC | PRN
  Administered 2016-09-06: 20 mL via INTRAVENOUS

## 2016-09-07 ENCOUNTER — Ambulatory Visit (HOSPITAL_COMMUNITY): Payer: PPO

## 2016-09-07 ENCOUNTER — Telehealth (HOSPITAL_COMMUNITY): Payer: Self-pay | Admitting: Physical Therapy

## 2016-09-07 DIAGNOSIS — M545 Low back pain: Secondary | ICD-10-CM | POA: Diagnosis not present

## 2016-09-07 DIAGNOSIS — R2689 Other abnormalities of gait and mobility: Secondary | ICD-10-CM

## 2016-09-07 DIAGNOSIS — M62830 Muscle spasm of back: Secondary | ICD-10-CM

## 2016-09-07 DIAGNOSIS — M6281 Muscle weakness (generalized): Secondary | ICD-10-CM

## 2016-09-07 NOTE — Telephone Encounter (Signed)
He will be on vacation

## 2016-09-07 NOTE — Therapy (Signed)
[place], [place], [address] Phone: [phone]   Fax:  [phone]  Physical Therapy Treatment  Patient Details  Name: Hector Rose MRN: [id number] Date of Birth: December 30, 1940 Referring Provider: Latanya Maudlin, MD  Encounter Date: 09/07/2016      PT End of Session - 09/07/16 1657    Visit Number 6   Number of Visits 13   Date for PT Re-Evaluation 09/18/16   Authorization Type [employer]    Authorization Time Period 08/21/16 to 10/02/16   Authorization - Visit Number 6   Authorization - Number of Visits 10   PT Start Time 4967   PT Stop Time 1732   PT Time Calculation (min) 42 min   Activity Tolerance Patient tolerated treatment well;No increased pain   Behavior During Therapy WFL for tasks assessed/performed      Past Medical History:  Diagnosis Date  . Asthma 04-11-11   AS CHILD ONLY  . BPH (benign prostatic hypertrophy) with urinary obstruction   . Carpal tunnel syndrome   . Difficult intubation 04-11-11   08-11-97 some issues with intubation/record with chart  . DJD (degenerative joint disease)   . DM (diabetes mellitus) ([place]) 04-11-11   dx. 8-10 yrs. -oral meds ,cbg's(120-150)  . DVT femoral (deep venous thrombosis) with thrombophlebitis ([place]) 04-11-11   ?'09-tx. warfarin, pt being bridge with Lovenox 30mg  x2daily,x 5days  . GERD (gastroesophageal reflux disease) 04-11-11   mild, no med in 1 month  . Gout 04-11-11    ankle 2 yrs ago  . Hemorrhoids   . Hypercholesteremia   . Hypertension   . Internal hemorrhoids   . Keloid 04-11-11   multiple-arms,back, chest  . Pulmonary nodule 04-11-11   "PT. NOT AWARE" -denies problems with breathing  . Renal calculus   . Thyroid disease    hypothyroid    Past Surgical History:  Procedure Laterality Date  . cataract surgery  12/2013  . COSMETIC SURGERY  2000   Keloid injections  . CYSTOSCOPY WITH BIOPSY  04/14/2011   Procedure: CYSTOSCOPY WITH  BIOPSY;  Surgeon: Dutch Gray, MD;  [facility];  Service: Urology;  Laterality: N/A;  Bladder Biopsies   . CYSTOSCOPY/RETROGRADE/URETEROSCOPY  04/14/2011   Procedure: CYSTOSCOPY/RETROGRADE/URETEROSCOPY;  Surgeon: Dutch Gray, MD;  [facility];  Service: Urology;  Laterality: Bilateral;  (Bil) RPG   . ortho surgery on fingers  2005 & 2008   Dr. Lorelle Formosa finger release  . PROSTATECTOMY  1999   Dr. Rosana Hoes    There were no vitals filed for this visit.      Subjective Assessment - 09/07/16 1654    Subjective Pt stated his back is feeling good today, no reports of pain has a little soreness in lower back, no pain scale given.  Had MRI done yesterday, waiting for follow up with MD.   Pertinent History BPH, DJD, GERD, HTN, pulmonary nodule, thyroid disease   Currently in Pain? No/denies                         [facility] Adult PT Treatment/Exercise - 09/07/16 0001      Lumbar Exercises: Stretches   Active Hamstring Stretch 3 reps;30 seconds   Active Hamstring Stretch Limitations BLE, supine with rope   Prone Mid Back Stretch Limitations pec stretch in doorway 3 x 30 sec     Lumbar Exercises: Standing   Other Standing Lumbar Exercises back against wall cervical retracion x  5; UE flexion with cervical retraction 10x   Other Standing Lumbar Exercises Y's on wall with liftoff 15 reps     Lumbar Exercises: Seated   Other Seated Lumbar Exercises thoracic extension over 1/2 foam roll T9 to T5 x 5 reps at each segment     Lumbar Exercises: Supine   Other Supine Lumbar Exercises 54min supine position with 1/2 foam at thoracic to improve extension     Lumbar Exercises: Sidelying   Other Sidelying Lumbar Exercises thoracic rotation in fetal position BUE     Lumbar Exercises: Prone   Other Prone Lumbar Exercises bil Y's, T's, and rowing (with 3# DB) x 10 reps each (increased difficulty with BUE throughout)                  PT Short Term Goals - 08/21/16 1224       PT SHORT TERM GOAL #1   Title Pt will demo consistency and independence with his HEP to improve lumbar mobility and pain.    Time 3   Period Weeks   Status New     PT SHORT TERM GOAL #2   Title Pt will demo correct set up and use of lumbar roll to improve his sitting posture and decrease pain with transition to stand.    Time 3   Period Weeks   Status New     PT SHORT TERM GOAL #3   Title Pt will demo improved body mechanics evident by his ability to perform log roll technique during his sessions, with no more than MinA from the therapist.    Time 3   Period Weeks   Status New           PT Long Term Goals - 08/21/16 1226      PT LONG TERM GOAL #1   Title Pt will report atleast 50% improvement in his pain and symptoms since the start of therapy, to increase his activity tolerance and quality of life.    Time 6   Period Weeks   Status New     PT LONG TERM GOAL #2   Title Pt will demo improvement in BLE strength to atleast 4+/5 MMT, which will increase his safety with functional activity.    Time 6   Period Weeks   Status New     PT LONG TERM GOAL #3   Title Pt will demo improved hamstring flexibility, evident by atleast a 10 deg improvement in 90/90 tasting, which will improve his sitting and standing posture throughout the day.    Time 6   Period Weeks   Status New     PT LONG TERM GOAL #4   Title Pt will complete 5x sit to stand in less than 13 sec, without UE support, to demonstrate and improvement in his functional strength and power.   Time 6   Period Weeks   Status New               Plan - 09/07/16 1718    Clinical Impression Statement Continued session foucs to improve spinal mobility and postural strengthening.  Added sidelying rotation and stretches in supine with thoracic foam to improve thoracic mobility.  Pt continues to have difficulty with cervical retraction and UE flexion especially with prone exercises due to weakness and limited  UE/thoracic mobility.  Therapist facilitaiton for proper form/technqiues through session.  No reoprts of pain through session, was limited by fatigue.     Rehab Potential Good  PT Frequency 2x / week   PT Duration 6 weeks   PT Treatment/Interventions Moist Heat;Gait training;Neuromuscular re-education;Balance training;Therapeutic exercise;Therapeutic activities;Functional mobility training;Stair training;Patient/family education;Manual techniques;Dry needling   PT Next Visit Plan Continue with prone posture strengthening; BUE extension , Y's and T's; continue pec stretch; continue with sidelying thoracic rotation and perform thoracic ext over 1/2 foam roll in supine    PT Home Exercise Plan supine low trunk rotation, BLE bridge, sidelying clams with red TB, thoracic extension over chair; 6/14: quad and HS stretch; 6/25: pec stretch in doorway      Patient will benefit from skilled therapeutic intervention in order to improve the following deficits and impairments:  Improper body mechanics, Pain, Postural dysfunction, Increased muscle spasms, Decreased mobility, Decreased strength, Decreased range of motion, Decreased endurance, Impaired flexibility  Visit Diagnosis: Midline low back pain, unspecified chronicity, with sciatica presence unspecified  Muscle weakness (generalized)  Other abnormalities of gait and mobility  Muscle spasm of back     Problem List Patient Active Problem List   Diagnosis Date Noted  . History of DVT (deep vein thrombosis) 07/20/2016  . Renal lesion 07/20/2016  . Type 2 diabetes mellitus with stage 2 chronic kidney disease ([place]) 01/15/2014  . Chronic idiopathic gout of multiple sites 01/15/2014  . Calcified pleural plaque on chest x-ray 06/30/2013  . Tinea pedis 09/28/2011  . Hematuria 05/19/2011  . HEMORRHOIDS 05/29/2007  . Pulmonary nodule 03/27/2007  . GERD 03/27/2007  . Benign prostatic hyperplasia with urinary obstruction 03/27/2007  . KELOID  03/27/2007  . Hyperlipidemia 01/24/2007  . CARPAL TUNNEL SYNDROME 01/24/2007  . Essential hypertension 01/24/2007  . RENAL CALCULUS 01/24/2007  . Osteoarthritis 01/24/2007   Ihor Austin, [place]; [place]  Aldona Lento 09/07/2016, 5:47 PM  [facility] [address] [place], [place], [address] Phone: [phone]   Fax:  [phone]  Name: Hector Rose MRN: [id number] Date of Birth: May 14, 1940

## 2016-09-11 ENCOUNTER — Ambulatory Visit (HOSPITAL_COMMUNITY): Payer: PPO | Admitting: Physical Therapy

## 2016-09-14 ENCOUNTER — Ambulatory Visit (HOSPITAL_COMMUNITY): Payer: PPO | Admitting: Physical Therapy

## 2016-09-15 ENCOUNTER — Other Ambulatory Visit: Payer: Self-pay | Admitting: "Endocrinology

## 2016-09-15 DIAGNOSIS — Z794 Long term (current) use of insulin: Secondary | ICD-10-CM | POA: Diagnosis not present

## 2016-09-15 DIAGNOSIS — N182 Chronic kidney disease, stage 2 (mild): Secondary | ICD-10-CM | POA: Diagnosis not present

## 2016-09-15 DIAGNOSIS — E1122 Type 2 diabetes mellitus with diabetic chronic kidney disease: Secondary | ICD-10-CM | POA: Diagnosis not present

## 2016-09-15 LAB — COMPREHENSIVE METABOLIC PANEL
ALK PHOS: 74 U/L (ref 40–115)
ALT: 22 U/L (ref 9–46)
AST: 26 U/L (ref 10–35)
Albumin: 4.3 g/dL (ref 3.6–5.1)
BILIRUBIN TOTAL: 0.5 mg/dL (ref 0.2–1.2)
BUN: 17 mg/dL (ref 7–25)
CALCIUM: 9.1 mg/dL (ref 8.6–10.3)
CO2: 28 mmol/L (ref 20–31)
Chloride: 106 mmol/L (ref 98–110)
Creat: 1.29 mg/dL — ABNORMAL HIGH (ref 0.70–1.18)
GLUCOSE: 137 mg/dL — AB (ref 65–99)
POTASSIUM: 3.9 mmol/L (ref 3.5–5.3)
Sodium: 141 mmol/L (ref 135–146)
TOTAL PROTEIN: 6.9 g/dL (ref 6.1–8.1)

## 2016-09-16 LAB — HEMOGLOBIN A1C
HEMOGLOBIN A1C: 7.9 % — AB (ref <5.7)
Mean Plasma Glucose: 180 mg/dL

## 2016-09-18 ENCOUNTER — Ambulatory Visit (HOSPITAL_COMMUNITY): Payer: PPO | Attending: Orthopedic Surgery | Admitting: Physical Therapy

## 2016-09-18 DIAGNOSIS — M62830 Muscle spasm of back: Secondary | ICD-10-CM | POA: Diagnosis not present

## 2016-09-18 DIAGNOSIS — R2689 Other abnormalities of gait and mobility: Secondary | ICD-10-CM | POA: Insufficient documentation

## 2016-09-18 DIAGNOSIS — M545 Low back pain: Secondary | ICD-10-CM | POA: Diagnosis not present

## 2016-09-18 DIAGNOSIS — M6281 Muscle weakness (generalized): Secondary | ICD-10-CM | POA: Insufficient documentation

## 2016-09-18 NOTE — Therapy (Signed)
[place] [place], [place], [address] Phone: [phone]   Fax:  [phone]  Physical Therapy Treatment/Discharge  Patient Details  Name: Hector Rose MRN: [id number] Date of Birth: 02/19/1941 Referring Provider: Latanya Maudlin, MD  Encounter Date: 09/18/2016      PT End of Session - 09/18/16 1102    Visit Number 7   Number of Visits 13   Date for PT Re-Evaluation 09/18/16   Authorization Type [employer]    Authorization Time Period 08/21/16 to 10/02/16   Authorization - Visit Number 7   Authorization - Number of Visits 10   PT Start Time 9163   PT Stop Time 1101   PT Time Calculation (min) 26 min   Activity Tolerance Patient tolerated treatment well;No increased pain   Behavior During Therapy WFL for tasks assessed/performed      Past Medical History:  Diagnosis Date  . Asthma 04-11-11   AS CHILD ONLY  . BPH (benign prostatic hypertrophy) with urinary obstruction   . Carpal tunnel syndrome   . Difficult intubation 04-11-11   08-11-97 some issues with intubation/record with chart  . DJD (degenerative joint disease)   . DM (diabetes mellitus) ([place]) 04-11-11   dx. 8-10 yrs. -oral meds ,cbg's(120-150)  . DVT femoral (deep venous thrombosis) with thrombophlebitis ([place]) 04-11-11   ?'09-tx. warfarin, pt being bridge with Lovenox 2m x2daily,x 5days  . GERD (gastroesophageal reflux disease) 04-11-11   mild, no med in 1 month  . Gout 04-11-11    ankle 2 yrs ago  . Hemorrhoids   . Hypercholesteremia   . Hypertension   . Internal hemorrhoids   . Keloid 04-11-11   multiple-arms,back, chest  . Pulmonary nodule 04-11-11   "PT. NOT AWARE" -denies problems with breathing  . Renal calculus   . Thyroid disease    hypothyroid    Past Surgical History:  Procedure Laterality Date  . cataract surgery  12/2013  . COSMETIC SURGERY  2000   Keloid injections  . CYSTOSCOPY WITH BIOPSY  04/14/2011   Procedure: CYSTOSCOPY  WITH BIOPSY;  Surgeon: LDutch Gray MD;  Location: [facility];  Service: Urology;  Laterality: N/A;  Bladder Biopsies   . CYSTOSCOPY/RETROGRADE/URETEROSCOPY  04/14/2011   Procedure: CYSTOSCOPY/RETROGRADE/URETEROSCOPY;  Surgeon: LDutch Gray MD;  Location: [facility];  Service: Urology;  Laterality: Bilateral;  (Bil) RPG   . ortho surgery on fingers  2005 & 2008   Dr. SLorelle Formosafinger release  . PROSTATECTOMY  1999   Dr. DRosana Hoes   There were no vitals filed for this visit.      Subjective Assessment - 09/18/16 1035    Subjective Pt reports that his back is doing very well. He feels that his back is atleast 75-80% improved. Most of his issue is just with stiffness in the mornings. His HEP is going well.    Pertinent History BPH, DJD, GERD, HTN, pulmonary nodule, thyroid disease   How long can you sit comfortably? unlimited    How long can you stand comfortably? unlimited   How long can you walk comfortably? unlimited    Diagnostic tests Xray/MRI: arthritic changes    Currently in Pain? No/denies            [facility] 09[facility]18[facility]16 0001      Assessment   Medical Diagnosis Acute on chronic LBP   Referring Provider RLatanya Maudlin MD   Onset Date/Surgical Date --  initially in Feb, exacerbation around end of March  Next MD Visit 10/26/16  unsure if July or August      Precautions   Precautions None     Restrictions   Weight Bearing Restrictions No     Balance Screen   Has the patient fallen in the past 6 months No   Has the patient had a decrease in activity level because of a fear of falling?  No   Is the patient reluctant to leave their home because of a fear of falling?  No     Prior Function   Level of Independence Independent   Vocation Retired     Charity fundraiser Status Within Functional Limits for tasks assessed     Observation/Other Assessments   Observations sitting slouched in chair    Focus on Therapeutic Outcomes (FOTO)  27% limited       Sensation   Light Touch Appears Intact     AROM   Lumbar Flexion decreased lumbar flexion, pain reported coming back up    Lumbar Extension minimal lumbar extension noted, pain free     Strength   Right Hip Flexion 5/5   Right Hip Extension 5/5   Right Hip ABduction 4+/5   Left Hip Flexion 5/5   Left Hip Extension 5/5   Left Hip ABduction 4+/5   Right Knee Extension 5/5   Left Knee Extension 5/5   Right Ankle Dorsiflexion 5/5   Left Ankle Dorsiflexion 5/5     Flexibility   Soft Tissue Assessment /Muscle Length yes   Hamstrings Lt: 35deg, Rt: 40 deg lacking   Quadriceps 90 deg, BLE      Palpation   Palpation comment --     Transfers   Five time sit to stand comments  12.4 sec without UE             PT Education - 09/18/16 1110    Education provided Yes   Education Details reviewed goals; lifting mechanics; set up and use of lumbar roll; provided update of pt's HEP   Person(s) Educated Patient   Methods Explanation;Demonstration;Verbal cues;Handout   Comprehension Returned demonstration;Verbalized understanding          PT Short Term Goals - 09/18/16 1049      PT SHORT TERM GOAL #1   Title Pt will demo consistency and independence with his HEP to improve lumbar mobility and pain.    Time 3   Period Weeks   Status Achieved     PT SHORT TERM GOAL #2   Title Pt will demo correct set up and use of lumbar roll to improve his sitting posture and decrease pain with transition to stand.    Baseline initially tried this, but has since forgotten   Time 3   Period Weeks   Status Partially Met     PT SHORT TERM GOAL #3   Title Pt will demo improved body mechanics evident by his ability to perform log roll technique during his sessions, with no more than MinA from the therapist.    Time 3   Period Weeks   Status Achieved           PT Long Term Goals - 09/18/16 1050      PT LONG TERM GOAL #1   Title Pt will report atleast 50% improvement in his pain and  symptoms since the start of therapy, to increase his activity tolerance and quality of life.    Time 6   Period Weeks   Status Achieved  PT LONG TERM GOAL #2   Title Pt will demo improvement in BLE strength to atleast 4+/5 MMT, which will increase his safety with functional activity.    Time 6   Period Weeks   Status Achieved     PT LONG TERM GOAL #3   Title Pt will demo improved hamstring flexibility, evident by atleast a 10 deg improvement in 90/90 tasting, which will improve his sitting and standing posture throughout the day.    Time 6   Period Weeks   Status Achieved     PT LONG TERM GOAL #4   Title Pt will complete 5x sit to stand in less than 13 sec, without UE support, to demonstrate and improvement in his functional strength and power.   Time 6   Period Weeks   Status Achieved               Plan - 09/26/16 1102    Clinical Impression Statement Pt was discharged this visit having met all but 1 of his short and long term goals since beginning PT. He currently reports atleast a 75% improvement overall in his function and low back pain. He demonstrates near 5/5 MMT in the LEs, good performance on 5x sit to stand testing, and he is non-tender with palpation of the lumbar paraspinals and other musculature. Currently, pt reports the most issue with stiffness in the mornings. Therapist reviewed and updated pt's HEP to further improve his flexibility. Therapist also reviewed the set up and use of lumbar roll to improve his sitting posture and pt was able to demonstrate good understanding. Pt is agreeable with discharge from PT at this time to allow him to continue with his fitness at the [facility]. PT will sign off.    Rehab Potential Good   PT Frequency 2x / week   PT Duration 6 weeks   PT Treatment/Interventions Moist Heat;Gait training;Neuromuscular re-education;Balance training;Therapeutic exercise;Therapeutic activities;Functional mobility training;Stair  training;Patient/family education;Manual techniques;Dry needling   PT Next Visit Plan d/c home   PT Home Exercise Plan supine hamstring stretch, wall pec stretch, supine low trunk rotation,  prone on elbows stretch, lumbar roll set up    Consulted and Agree with Plan of Care Patient      Patient will benefit from skilled therapeutic intervention in order to improve the following deficits and impairments:  Improper body mechanics, Pain, Postural dysfunction, Increased muscle spasms, Decreased mobility, Decreased strength, Decreased range of motion, Decreased endurance, Impaired flexibility  Visit Diagnosis: Midline low back pain, unspecified chronicity, with sciatica presence unspecified  Muscle weakness (generalized)  Other abnormalities of gait and mobility  Muscle spasm of back       G-Codes - 26-Sep-2016 1109    Functional Assessment Tool Used (Outpatient Only) FOTO: 27% limited, strength and functional mobility    Functional Limitation Mobility: Walking and moving around   Mobility: Walking and Moving Around Goal Status [phone]) At least 20 percent but less than 40 percent impaired, limited or restricted   Mobility: Walking and Moving Around Discharge Status [phone]) At least 20 percent but less than 40 percent impaired, limited or restricted      Problem List Patient Active Problem List   Diagnosis Date Noted  . History of DVT (deep vein thrombosis) 07/20/2016  . Renal lesion 07/20/2016  . Type 2 diabetes mellitus with stage 2 chronic kidney disease ([place]) 01/15/2014  . Chronic idiopathic gout of multiple sites 01/15/2014  . Calcified pleural plaque on chest x-ray 06/30/2013  . Tinea  pedis 09/28/2011  . Hematuria 05/19/2011  . HEMORRHOIDS 05/29/2007  . Pulmonary nodule 03/27/2007  . GERD 03/27/2007  . Benign prostatic hyperplasia with urinary obstruction 03/27/2007  . KELOID 03/27/2007  . Hyperlipidemia 01/24/2007  . CARPAL TUNNEL SYNDROME 01/24/2007  . Essential  hypertension 01/24/2007  . RENAL CALCULUS 01/24/2007  . Osteoarthritis 01/24/2007     PHYSICAL THERAPY DISCHARGE SUMMARY  Visits from Start of Care: 7  Current functional level related to goals / functional outcomes: See above for more details    Remaining deficits: See above for more details    Education / Equipment: See above for more details  Plan: Patient agrees to discharge.  Patient goals were met. Patient is being discharged due to being pleased with the current functional level.  ?????       11:11 AM,09/18/16 [place], DPT [facility] [place] [address] [place], [place], [address] Phone: [phone]   Fax:  [phone]  Name: Hector Rose MRN: [id number] Date of Birth: 01-04-1941

## 2016-09-20 ENCOUNTER — Ambulatory Visit (HOSPITAL_COMMUNITY): Payer: PPO

## 2016-09-21 ENCOUNTER — Encounter: Payer: Self-pay | Admitting: "Endocrinology

## 2016-09-21 ENCOUNTER — Ambulatory Visit (INDEPENDENT_AMBULATORY_CARE_PROVIDER_SITE_OTHER): Payer: PPO | Admitting: "Endocrinology

## 2016-09-21 VITALS — BP 121/68 | HR 74 | Ht 73.0 in | Wt 231.0 lb

## 2016-09-21 DIAGNOSIS — H25812 Combined forms of age-related cataract, left eye: Secondary | ICD-10-CM | POA: Diagnosis not present

## 2016-09-21 DIAGNOSIS — Z961 Presence of intraocular lens: Secondary | ICD-10-CM | POA: Diagnosis not present

## 2016-09-21 DIAGNOSIS — E1122 Type 2 diabetes mellitus with diabetic chronic kidney disease: Secondary | ICD-10-CM

## 2016-09-21 DIAGNOSIS — N182 Chronic kidney disease, stage 2 (mild): Secondary | ICD-10-CM | POA: Diagnosis not present

## 2016-09-21 DIAGNOSIS — Z794 Long term (current) use of insulin: Secondary | ICD-10-CM | POA: Diagnosis not present

## 2016-09-21 DIAGNOSIS — E782 Mixed hyperlipidemia: Secondary | ICD-10-CM | POA: Diagnosis not present

## 2016-09-21 DIAGNOSIS — I10 Essential (primary) hypertension: Secondary | ICD-10-CM

## 2016-09-21 DIAGNOSIS — H35341 Macular cyst, hole, or pseudohole, right eye: Secondary | ICD-10-CM | POA: Diagnosis not present

## 2016-09-21 DIAGNOSIS — E1129 Type 2 diabetes mellitus with other diabetic kidney complication: Secondary | ICD-10-CM | POA: Diagnosis not present

## 2016-09-21 DIAGNOSIS — H26491 Other secondary cataract, right eye: Secondary | ICD-10-CM | POA: Diagnosis not present

## 2016-09-21 MED ORDER — METFORMIN HCL ER 500 MG PO TB24: 500.0000 mg | ORAL_TABLET | Freq: Every day | ORAL | 2 refills | Status: DC

## 2016-09-21 NOTE — Progress Notes (Signed)
Subjective:    Patient ID: Hector Rose, male    DOB: 1940-10-03,    Past Medical History:  Diagnosis Date  . Asthma 04-11-11   AS CHILD ONLY  . BPH (benign prostatic hypertrophy) with urinary obstruction   . Carpal tunnel syndrome   . Difficult intubation 04-11-11   08-11-97 some issues with intubation/record with chart  . DJD (degenerative joint disease)   . DM (diabetes mellitus) ([place]) 04-11-11   dx. 8-10 yrs. -oral meds ,cbg's(120-150)  . DVT femoral (deep venous thrombosis) with thrombophlebitis ([place]) 04-11-11   ?'09-tx. warfarin, pt being bridge with Lovenox 30mg  x2daily,x 5days  . GERD (gastroesophageal reflux disease) 04-11-11   mild, no med in 1 month  . Gout 04-11-11    ankle 2 yrs ago  . Hemorrhoids   . Hypercholesteremia   . Hypertension   . Internal hemorrhoids   . Keloid 04-11-11   multiple-arms,back, chest  . Pulmonary nodule 04-11-11   "PT. NOT AWARE" -denies problems with breathing  . Renal calculus   . Thyroid disease    hypothyroid   Past Surgical History:  Procedure Laterality Date  . cataract surgery  12/2013  . COSMETIC SURGERY  2000   Keloid injections  . CYSTOSCOPY WITH BIOPSY  04/14/2011   Procedure: CYSTOSCOPY WITH BIOPSY;  Surgeon: Dutch Gray, MD;  Location: [facility];  Service: Urology;  Laterality: N/A;  Bladder Biopsies   . CYSTOSCOPY/RETROGRADE/URETEROSCOPY  04/14/2011   Procedure: CYSTOSCOPY/RETROGRADE/URETEROSCOPY;  Surgeon: Dutch Gray, MD;  Location: [facility];  Service: Urology;  Laterality: Bilateral;  (Bil) RPG   . ortho surgery on fingers  2005 & 2008   Dr. Lorelle Formosa finger release  . PROSTATECTOMY  1999   Dr. Rosana Hoes   Social History   Social History  . Marital status: Married    Spouse name: N/A  . Number of children: 2  . Years of education: N/A   Occupational History  . retired from [employer]  . Smoking status: Never Smoker  . Smokeless tobacco: Never Used  . Alcohol use No  . Drug use: No  .  Sexual activity: Yes   Other Topics Concern  . None   Social History Narrative  . None   Outpatient Encounter Prescriptions as of 09/21/2016  Medication Sig  . acetaminophen (TYLENOL) 500 MG tablet Take 1,000 mg by mouth every 6 (six) hours as needed for moderate pain.  Marland Kitchen allopurinol (ZYLOPRIM) 300 MG tablet TAKE 1 TABLET BY MOUTH ONCE DAILY  . amLODipine (NORVASC) 10 MG tablet TAKE 1 TABLET EVERY MORNING  . b complex vitamins tablet Take 1 tablet by mouth daily.  . BD INSULIN SYRINGE ULTRAFINE 31G X 15/64" 1 ML MISC USE TWICE A DAY AS DIRECTED  . diclofenac (VOLTAREN) 75 MG EC tablet Take 75 mg by mouth 2 (two) times daily.  . fluticasone (FLONASE) 50 MCG/ACT nasal spray Place 1 spray into both nostrils daily as needed for allergies or rhinitis.  Marland Kitchen gabapentin (NEURONTIN) 300 MG capsule Take 1 capsule (300 mg total) by mouth 3 (three) times daily. (Patient taking differently: Take 300 mg by mouth 2 (two) times daily. )  . insulin NPH-regular Human (NOVOLIN 70/30) (70-30) 100 UNIT/ML injection Inject 40 Units into the skin 2 (two) times daily before a meal. 40 units with breakfast and 30 units with supper  . metFORMIN (GLUCOPHAGE-XR) 500 MG 24 hr tablet Take 1 tablet (500 mg total) by mouth daily with breakfast.  .  methocarbamol (ROBAXIN) 500 MG tablet Take 1 tablet (500 mg total) by mouth 2 (two) times daily.  . metoprolol (LOPRESSOR) 50 MG tablet TAKE 1 TABLET TWICE DAILY  . Multiple Vitamin (MULITIVITAMIN WITH MINERALS) TABS Take 1 tablet by mouth daily. Mens One a Day.  . Omega-3 Fatty Acids (FISH OIL PO) Take 1 capsule by mouth daily.  Marland Kitchen omeprazole (PRILOSEC) 20 MG capsule TAKE ONE CAPSULE BY MOUTH ONCE DAILY AS NEEDED FOR STOMACH ACID  . ONE TOUCH ULTRA TEST test strip USE TO TEST BLOOD SUGAR TWICE DAILY.  Marland Kitchen ONETOUCH DELICA LANCETS 24Q MISC USE TO TEST BLOOD SUGAR TWICE DAILY.  Marland Kitchen pseudoephedrine-acetaminophen (TYLENOL SINUS) 30-500 MG TABS tablet Take 1 tablet by mouth every 4 (four)  hours as needed.  . rivaroxaban (XARELTO) 20 MG TABS tablet Take 20 mg by mouth daily with supper.  . tamsulosin (FLOMAX) 0.4 MG CAPS capsule Take 0.4 mg by mouth.   No facility-administered encounter medications on file as of 09/21/2016.    ALLERGIES: Allergies  Allergen Reactions  . Ace Inhibitors Swelling    REACTION: angio edema (swelling of lips)  . Metformin And Related     itching   VACCINATION STATUS: Immunization History  Administered Date(s) Administered  . Influenza Split 11/27/2011, 12/30/2012, 01/01/2014  . Influenza Whole 01/27/2009, 11/30/2009, 11/17/2010  . Influenza, High Dose Seasonal PF 12/21/2015  . Pneumococcal Polysaccharide-23 12/02/2008    Diabetes  He presents for his follow-up diabetic visit. He has type 2 diabetes mellitus. Onset time: Diagnosed approximately  at age 20. His disease course has been improving. Pertinent negatives for hypoglycemia include no confusion, headaches, pallor or seizures. Pertinent negatives for diabetes include no chest pain, no fatigue, no polydipsia, no polyphagia, no polyuria and no weakness. Symptoms are improving. Risk factors for coronary artery disease include diabetes mellitus, dyslipidemia, hypertension, male sex and sedentary lifestyle. Current diabetic treatment includes insulin injections. His weight is increasing steadily. He is following a generally unhealthy diet. Meal planning includes ADA exchanges. He participates in exercise intermittently. His breakfast blood glucose range is generally 140-180 mg/dl. His dinner blood glucose range is generally 180-200 mg/dl. His overall blood glucose range is 180-200 mg/dl.  Hypertension  This is a chronic problem. The current episode started more than 1 year ago. The problem is controlled. Pertinent negatives include no chest pain, headaches, neck pain, palpitations or shortness of breath. Past treatments include calcium channel blockers.  Hyperlipidemia  This is a chronic problem.  The current episode started more than 1 year ago. The problem is uncontrolled. Pertinent negatives include no chest pain, myalgias or shortness of breath. Current antihyperlipidemic treatment includes bile acid squestrants. Risk factors for coronary artery disease include dyslipidemia and diabetes mellitus.     Review of Systems  Constitutional: Negative for fatigue and unexpected weight change.  HENT: Negative for dental problem, mouth sores and trouble swallowing.   Eyes: Negative for visual disturbance.  Respiratory: Negative for cough, choking, chest tightness, shortness of breath and wheezing.   Cardiovascular: Negative for chest pain, palpitations and leg swelling.  Gastrointestinal: Negative for abdominal distention, abdominal pain, constipation, diarrhea, nausea and vomiting.  Endocrine: Negative for polydipsia, polyphagia and polyuria.  Genitourinary: Negative for dysuria, flank pain, hematuria and urgency.  Musculoskeletal: Negative for back pain, gait problem, joint swelling, myalgias and neck pain.  Skin: Negative for pallor, rash and wound.  Neurological: Negative for seizures, syncope, weakness, numbness and headaches.  Psychiatric/Behavioral: Negative for confusion, dysphoric mood, hallucinations and suicidal ideas.  Objective:    BP 121/68   Pulse 74   Ht 6\' 1"  (1.854 m)   Wt 231 lb (104.8 kg)   BMI 30.48 kg/m   Wt Readings from Last 3 Encounters:  09/21/16 231 lb (104.8 kg)  07/20/16 227 lb 6 oz (103.1 kg)  06/22/16 232 lb (105.2 kg)    Physical Exam  Constitutional: He is oriented to person, place, and time. He appears well-developed and well-nourished. He is cooperative.  HENT:  Head: Normocephalic and atraumatic.  Eyes: EOM are normal.  Neck: Normal range of motion. Neck supple. No tracheal deviation present. No thyromegaly present.  Cardiovascular: Normal rate and normal heart sounds.  Exam reveals no gallop.   No murmur heard. Pulses:      Carotid  pulses are 2+ on the right side.      Radial pulses are 2+ on the right side.       Dorsalis pedis pulses are 2+ on the right side.       Posterior tibial pulses are 2+ on the right side.  Pulmonary/Chest: Breath sounds normal. No respiratory distress. He has no wheezes.  Abdominal: Soft. Bowel sounds are normal. He exhibits no distension. There is no hepatosplenomegaly. There is no tenderness. There is no guarding and no CVA tenderness.  Musculoskeletal: He exhibits no edema.       Right shoulder: He exhibits no swelling and no deformity.  Lymphadenopathy:       Head (right side): No submental and no submandibular adenopathy present.       Head (left side): No submental and no submandibular adenopathy present.       Right cervical: No superficial cervical adenopathy present.      Left cervical: No superficial cervical adenopathy present.       Right: No supraclavicular adenopathy present.       Left: No supraclavicular adenopathy present.  Neurological: He is alert and oriented to person, place, and time. He has normal strength and normal reflexes. No cranial nerve deficit or sensory deficit. Gait normal.  Skin: Skin is warm and dry. No rash noted. No cyanosis. Nails show no clubbing.  Psychiatric: He has a normal mood and affect. His speech is normal and behavior is normal. Judgment and thought content normal. Cognition and memory are normal.   Recent Results (from the past 2160 hour(s))  I-STAT creatinine     Status: Abnormal   Collection Time: 09/06/16 12:13 PM  Result Value Ref Range   Creatinine, Ser 1.30 (H) 0.61 - 1.24 mg/dL  Comprehensive metabolic panel     Status: Abnormal   Collection Time: 09/15/16  7:46 AM  Result Value Ref Range   Sodium 141 135 - 146 mmol/L   Potassium 3.9 3.5 - 5.3 mmol/L   Chloride 106 98 - 110 mmol/L   CO2 28 20 - 31 mmol/L   Glucose, Bld 137 (H) 65 - 99 mg/dL   BUN 17 7 - 25 mg/dL   Creat 1.29 (H) 0.70 - 1.18 mg/dL    Comment:   For patients >  or = 75 years of age: The upper reference limit for Creatinine is approximately 13% higher for people identified as African-American.      Total Bilirubin 0.5 0.2 - 1.2 mg/dL   Alkaline Phosphatase 74 40 - 115 U/L   AST 26 10 - 35 U/L   ALT 22 9 - 46 U/L   Total Protein 6.9 6.1 - 8.1 g/dL   Albumin 4.3 3.6 -  5.1 g/dL   Calcium 9.1 8.6 - 10.3 mg/dL  Hemoglobin A1c     Status: Abnormal   Collection Time: 09/15/16  7:46 AM  Result Value Ref Range   Hgb A1c MFr Bld 7.9 (H) <5.7 %    Comment:   For someone without known diabetes, a hemoglobin A1c value of 6.5% or greater indicates that they may have diabetes and this should be confirmed with a follow-up test.   For someone with known diabetes, a value <7% indicates that their diabetes is well controlled and a value greater than or equal to 7% indicates suboptimal control. A1c targets should be individualized based on duration of diabetes, age, comorbid conditions, and other considerations.   Currently, no consensus exists for use of hemoglobin A1c for diagnosis of diabetes for children.      Mean Plasma Glucose 180 mg/dL   Lipid Panel     Component Value Date/Time   CHOL 165 03/17/2016 0714   TRIG 142 03/17/2016 0714   HDL 40 (L) 03/17/2016 0714   CHOLHDL 4.1 03/17/2016 0714   VLDL 28 03/17/2016 0714   LDLCALC 97 03/17/2016 0714   LDLDIRECT 147.5 06/10/2012 0930     Assessment & Plan:   1. Type 2 diabetes mellitus with stage 2 chronic kidney disease He came with improved  Fasting but above target suppertime blood glucose profile and his A1c is  Better at 7.9% from 8.1% .  Patient is advised to stick to a routine mealtimes to eat 3 meals  a day and avoid unnecessary snacks ( to snack only to correct hypoglycemia). Patient is advised to eliminate simple carbs  from his diet including:  cakes, desserts, ice cream, soda (  diet and regular) , sweet tea , candies,  chips,  cookies,  artificial sweeteners,   and "sugar-free"  products .  This will help stabilize  blood glucose profile and potentially help patient lose weight. Patient is given detailed personalized glucose monitoring and insulin dosing instructions. Patient is instructed to call back with extremes of blood glucose readings  less than 70 or greater than 300 mg/dl. Patient  is to bring meter and  blood glucose logs during their next visit. I plan to repeat the following labs on subsequent visits.   I advised him to  Continue  Novolin 70/30 to 40 units with breakfast and continue at 30 units with supper when pre-meal blood glucose is above 90. - His renal function is improving, however he did not tolerate full dose metformin in the past.  - We will attempt a low-dose metformin 500 mg ER daily with supper.  2. HYPERCHOLESTEROLEMIA  During his last visit he is LDL was above target at 130 and HDL at 40. Unfortunately Hector Rose does not tolerate statins even at low dose of 10 mg of pravastatin. He is advised to continue with omega-3 fatty acids ,avoid butter and fried food and exercise regularly.  3. Essential hypertension  Blood pressure today  is controlled to 121/68 improving from  142/90. Patient is advised to continue amlodipine.   Follow up plan: Return in about 3 months (around 12/22/2016) for meter, and logs.  Glade Lloyd, MD Phone: [phone]  Fax: [phone]   09/21/2016, 3:36 PM

## 2016-09-21 NOTE — Patient Instructions (Signed)

## 2016-09-25 ENCOUNTER — Ambulatory Visit (HOSPITAL_COMMUNITY): Payer: PPO | Admitting: Physical Therapy

## 2016-09-25 DIAGNOSIS — M654 Radial styloid tenosynovitis [de Quervain]: Secondary | ICD-10-CM | POA: Diagnosis not present

## 2016-09-25 DIAGNOSIS — M48061 Spinal stenosis, lumbar region without neurogenic claudication: Secondary | ICD-10-CM | POA: Diagnosis not present

## 2016-09-28 ENCOUNTER — Encounter (HOSPITAL_COMMUNITY): Payer: PPO | Admitting: Physical Therapy

## 2016-10-04 DIAGNOSIS — M169 Osteoarthritis of hip, unspecified: Secondary | ICD-10-CM | POA: Diagnosis not present

## 2016-10-04 DIAGNOSIS — M1612 Unilateral primary osteoarthritis, left hip: Secondary | ICD-10-CM | POA: Diagnosis not present

## 2016-10-04 DIAGNOSIS — E6609 Other obesity due to excess calories: Secondary | ICD-10-CM | POA: Diagnosis not present

## 2016-10-04 DIAGNOSIS — Z1389 Encounter for screening for other disorder: Secondary | ICD-10-CM | POA: Diagnosis not present

## 2016-10-04 DIAGNOSIS — M25552 Pain in left hip: Secondary | ICD-10-CM | POA: Diagnosis not present

## 2016-10-04 DIAGNOSIS — Z6830 Body mass index (BMI) 30.0-30.9, adult: Secondary | ICD-10-CM | POA: Diagnosis not present

## 2016-10-25 DIAGNOSIS — Z6831 Body mass index (BMI) 31.0-31.9, adult: Secondary | ICD-10-CM | POA: Diagnosis not present

## 2016-10-25 DIAGNOSIS — E6609 Other obesity due to excess calories: Secondary | ICD-10-CM | POA: Diagnosis not present

## 2016-10-25 DIAGNOSIS — M109 Gout, unspecified: Secondary | ICD-10-CM | POA: Diagnosis not present

## 2016-10-25 DIAGNOSIS — E785 Hyperlipidemia, unspecified: Secondary | ICD-10-CM | POA: Diagnosis not present

## 2016-10-25 DIAGNOSIS — Z1389 Encounter for screening for other disorder: Secondary | ICD-10-CM | POA: Diagnosis not present

## 2016-10-25 DIAGNOSIS — Z0001 Encounter for general adult medical examination with abnormal findings: Secondary | ICD-10-CM | POA: Diagnosis not present

## 2016-10-25 DIAGNOSIS — E1129 Type 2 diabetes mellitus with other diabetic kidney complication: Secondary | ICD-10-CM | POA: Diagnosis not present

## 2016-10-25 DIAGNOSIS — D4101 Neoplasm of uncertain behavior of right kidney: Secondary | ICD-10-CM | POA: Diagnosis not present

## 2016-10-25 DIAGNOSIS — I10 Essential (primary) hypertension: Secondary | ICD-10-CM | POA: Diagnosis not present

## 2016-10-25 DIAGNOSIS — Z23 Encounter for immunization: Secondary | ICD-10-CM | POA: Diagnosis not present

## 2016-10-31 DIAGNOSIS — M48061 Spinal stenosis, lumbar region without neurogenic claudication: Secondary | ICD-10-CM | POA: Diagnosis not present

## 2016-11-01 ENCOUNTER — Ambulatory Visit (INDEPENDENT_AMBULATORY_CARE_PROVIDER_SITE_OTHER): Payer: PPO | Admitting: Gastroenterology

## 2016-11-01 ENCOUNTER — Encounter: Payer: Self-pay | Admitting: Gastroenterology

## 2016-11-01 ENCOUNTER — Telehealth: Payer: Self-pay

## 2016-11-01 VITALS — BP 140/80 | HR 70 | Ht 73.0 in | Wt 232.0 lb

## 2016-11-01 DIAGNOSIS — Z7901 Long term (current) use of anticoagulants: Secondary | ICD-10-CM

## 2016-11-01 DIAGNOSIS — Z1212 Encounter for screening for malignant neoplasm of rectum: Secondary | ICD-10-CM

## 2016-11-01 DIAGNOSIS — Z1211 Encounter for screening for malignant neoplasm of colon: Secondary | ICD-10-CM | POA: Diagnosis not present

## 2016-11-01 MED ORDER — SUPREP BOWEL PREP KIT 17.5-3.13-1.6 GM/177ML PO SOLN: ORAL | 0 refills | Status: DC

## 2016-11-01 NOTE — Telephone Encounter (Signed)
   Hector Rose Oct 02, 1940 [id number]  Dear Dr. Hilma Favors:  We have scheduled the above named patient for a(n) colonoscopy procedure. Our records show that (s)he is on anticoagulation therapy.  Please advise as to whether the patient may come off their therapy of Xarelto for 2 days prior to their procedure which is scheduled for 01-03-17.  Please route your response to Lemar Lofty or fax response to [phone].  Sincerely,    [facility]

## 2016-11-01 NOTE — Patient Instructions (Signed)
You have been scheduled for a colonoscopy. Please follow written instructions given to you at your visit today.  Please pick up your prep supplies at the pharmacy within the next 1-3 days. If you use inhalers (even only as needed), please bring them with you on the day of your procedure. Your physician has requested that you go to www.startemmi.com and enter the access code given to you at your visit today. This web site gives a general overview about your procedure. However, you should still follow specific instructions given to you by our office regarding your preparation for the procedure.   If you are age 49 or older, your body mass index should be between 23-30. Your Body mass index is 30.61 kg/m. If this is out of the aforementioned range listed, please consider follow up with your Primary Care Provider.  If you are age 80 or younger, your body mass index should be between 19-25. Your Body mass index is 30.61 kg/m. If this is out of the aformentioned range listed, please consider follow up with your Primary Care Provider.    Thank you for choosing me and [facility].  Pricilla Riffle. Dagoberto Ligas., MD., Marval Regal

## 2016-11-01 NOTE — Progress Notes (Signed)
History of Present Illness: This is a 76 year old male referred by Sharilyn Sites, MD for the evaluation of CRC screening maintained on Xarelto. Colonoscopy performed in August 2008 showed only internal hemorrhoids. He has no GI complaints. Denies weight loss, abdominal pain, constipation, diarrhea, change in stool caliber, melena, hematochezia, nausea, vomiting, dysphagia, reflux symptoms, chest pain.   Allergies  Allergen Reactions  . Ace Inhibitors Swelling    REACTION: angio edema (swelling of lips)  . Metformin And Related     itching   Outpatient Medications Prior to Visit  Medication Sig Dispense Refill  . acetaminophen (TYLENOL) 500 MG tablet Take 1,000 mg by mouth every 6 (six) hours as needed for moderate pain.    Marland Kitchen allopurinol (ZYLOPRIM) 300 MG tablet TAKE 1 TABLET BY MOUTH ONCE DAILY 90 tablet 0  . amLODipine (NORVASC) 10 MG tablet TAKE 1 TABLET EVERY MORNING 90 tablet 0  . BD INSULIN SYRINGE ULTRAFINE 31G X 15/64" 1 ML MISC USE TWICE A DAY AS DIRECTED 100 each 5  . diclofenac (VOLTAREN) 75 MG EC tablet Take 75 mg by mouth 2 (two) times daily.    . fluticasone (FLONASE) 50 MCG/ACT nasal spray Place 1 spray into both nostrils daily as needed for allergies or rhinitis.    Marland Kitchen gabapentin (NEURONTIN) 300 MG capsule Take 1 capsule (300 mg total) by mouth 3 (three) times daily. (Patient taking differently: Take 300 mg by mouth 2 (two) times daily. ) 30 capsule 0  . insulin NPH-regular Human (NOVOLIN 70/30) (70-30) 100 UNIT/ML injection Inject 40 Units into the skin 2 (two) times daily before a meal. 40 units with breakfast and 30 units with supper 30 mL 2  . metFORMIN (GLUCOPHAGE-XR) 500 MG 24 hr tablet Take 1 tablet (500 mg total) by mouth daily with breakfast. 30 tablet 2  . methocarbamol (ROBAXIN) 500 MG tablet Take 1 tablet (500 mg total) by mouth 2 (two) times daily. 14 tablet 0  . metoprolol (LOPRESSOR) 50 MG tablet TAKE 1 TABLET TWICE DAILY 180 tablet 0  . Multiple Vitamin  (MULITIVITAMIN WITH MINERALS) TABS Take 1 tablet by mouth daily. Mens One a Day.    Marland Kitchen omeprazole (PRILOSEC) 20 MG capsule TAKE ONE CAPSULE BY MOUTH ONCE DAILY AS NEEDED FOR STOMACH ACID 30 capsule 5  . ONE TOUCH ULTRA TEST test strip USE TO TEST BLOOD SUGAR TWICE DAILY. 100 each 5  . ONETOUCH DELICA LANCETS 58P MISC USE TO TEST BLOOD SUGAR TWICE DAILY. 100 each 5  . pseudoephedrine-acetaminophen (TYLENOL SINUS) 30-500 MG TABS tablet Take 1 tablet by mouth every 4 (four) hours as needed.    . rivaroxaban (XARELTO) 20 MG TABS tablet Take 20 mg by mouth daily with supper.    Marland Kitchen b complex vitamins tablet Take 1 tablet by mouth daily.    . Omega-3 Fatty Acids (FISH OIL PO) Take 1 capsule by mouth daily.    . tamsulosin (FLOMAX) 0.4 MG CAPS capsule Take 0.4 mg by mouth.     No facility-administered medications prior to visit.    Past Medical History:  Diagnosis Date  . Asthma 04-11-11   AS CHILD ONLY  . BPH (benign prostatic hypertrophy) with urinary obstruction   . Carpal tunnel syndrome   . Difficult intubation 04-11-11   08-11-97 some issues with intubation/record with chart  . DJD (degenerative joint disease)   . DM (diabetes mellitus) ([place]) 04-11-11   dx. 8-10 yrs. -oral meds ,cbg's(120-150)  . DVT femoral (deep venous thrombosis)  with thrombophlebitis ([place]) 04-11-11   ?'09-tx. warfarin, pt being bridge with Lovenox 30mg  x2daily,x 5days  . GERD (gastroesophageal reflux disease) 04-11-11   mild, no med in 1 month  . Gout 04-11-11    ankle 2 yrs ago  . Hemorrhoids   . Hypercholesteremia   . Hypertension   . Internal hemorrhoids   . Keloid 04-11-11   multiple-arms,back, chest  . Pulmonary nodule 04-11-11   "PT. NOT AWARE" -denies problems with breathing  . Renal calculus   . Thyroid disease    hypothyroid   Past Surgical History:  Procedure Laterality Date  . cataract surgery  12/2013  . COSMETIC SURGERY  2000   Keloid injections  . CYSTOSCOPY WITH BIOPSY  04/14/2011   Procedure:  CYSTOSCOPY WITH BIOPSY;  Surgeon: Dutch Gray, MD;  Location: [facility];  Service: Urology;  Laterality: N/A;  Bladder Biopsies   . CYSTOSCOPY/RETROGRADE/URETEROSCOPY  04/14/2011   Procedure: CYSTOSCOPY/RETROGRADE/URETEROSCOPY;  Surgeon: Dutch Gray, MD;  Location: [facility];  Service: Urology;  Laterality: Bilateral;  (Bil) RPG   . ortho surgery on fingers  2005 & 2008   Dr. Lorelle Formosa finger release  . PROSTATECTOMY  1999   Dr. Rosana Hoes   Social History   Social History  . Marital status: Married    Spouse name: N/A  . Number of children: 2  . Years of education: N/A   Occupational History  . retired from [employer]  . Smoking status: Never Smoker  . Smokeless tobacco: Never Used  . Alcohol use No  . Drug use: No  . Sexual activity: Yes   Other Topics Concern  . None   Social History Narrative  . None   History reviewed. No pertinent family history.     Review of Systems: Pertinent positive and negative review of systems were noted in the above HPI section. All other review of systems were otherwise negative.    Physical Exam: General: Well developed, well nourished, no acute distress Head: Normocephalic and atraumatic Eyes:  sclerae anicteric, EOMI Ears: Normal auditory acuity Mouth: No deformity or lesions Neck: Supple, no masses or thyromegaly Lungs: Clear throughout to auscultation Heart: Regular rate and rhythm; no murmurs, rubs or bruits Abdomen: Soft, non tender and non distended. No masses, hepatosplenomegaly or hernias noted. Normal Bowel sounds Rectal: Deferred to colonoscopy Musculoskeletal: Symmetrical with no gross deformities  Skin: No lesions on visible extremities Pulses:  Normal pulses noted Extremities: No clubbing, cyanosis, edema or deformities noted Neurological: Alert oriented x 4, grossly nonfocal Cervical Nodes:  No significant cervical adenopathy Inguinal Nodes: No significant inguinal adenopathy Psychological:   Alert and cooperative. Normal mood and affect  Assessment and Recommendations:  1. CRC screening, average risk. The risks (including bleeding, perforation, infection, missed lesions, medication reactions and possible hospitalization or surgery if complications occur), benefits, and alternatives to colonoscopy with possible biopsy and possible polypectomy were discussed with the patient and they consent to proceed.   2. Hold Xarelto days before procedure - will instruct when and how to resume after procedure. Low but real risk of cardiovascular event such as heart attack, stroke, embolism, thrombosis or ischemia/infarct of other organs off Xarelto explained and need to seek urgent help if this occurs. The patient consents to proceed. Will communicate by phone or EMR with patient's prescribing provider to confirm that holding Xarelto is reasonable in this case.     cc: Sharilyn Sites, [place] [address], [place] [address]

## 2016-11-10 NOTE — Telephone Encounter (Signed)
Letter resent to Dr Hilma Favors on 11/10/16 at 8:58 am.

## 2016-11-15 DIAGNOSIS — M5416 Radiculopathy, lumbar region: Secondary | ICD-10-CM | POA: Diagnosis not present

## 2016-11-15 NOTE — Telephone Encounter (Signed)
I contacted Dr Delanna Ahmadi office to check on status of letter. They state they have not received any of our faxes. I confirmed that the fax number that we have been faxing to is correct. I have refaxed letter to Dr Delanna Ahmadi office both manually and electronically and will await a response.

## 2016-11-17 ENCOUNTER — Encounter: Payer: Self-pay | Admitting: "Endocrinology

## 2016-11-21 NOTE — Telephone Encounter (Signed)
Spoke to Dr. Delanna Ahmadi office and refaxed the coag clearance letter requesting OK to hold Xarelto for 2 days prior to colonoscopy on 01-03-17.

## 2016-11-22 DIAGNOSIS — Z961 Presence of intraocular lens: Secondary | ICD-10-CM | POA: Diagnosis not present

## 2016-11-22 DIAGNOSIS — H26491 Other secondary cataract, right eye: Secondary | ICD-10-CM | POA: Diagnosis not present

## 2016-11-22 DIAGNOSIS — H2512 Age-related nuclear cataract, left eye: Secondary | ICD-10-CM | POA: Diagnosis not present

## 2016-11-22 NOTE — Telephone Encounter (Signed)
Again contacted Dr Cornelia Copa office to ask if decision has been made regarding Xarelto clearance for patient's upcoming procedure. I advised that we have sent request multiple times with no response, starting 11/01/16 and asked if she could help [place] look into this. She states that we will "just need to be patient." I explained that unfortunately, we are about 3 weeks out from our 1st response so we need to look into getting response. Staff member answering phone states that she spoke to a nurse that told her she has "not received anything." I again sent letter from our fax, [phone], and fax [phone] and she confirmed that she DID get this letter and will place it on the physicians desk for review and signature. She indicates that she "would never have known to place that on the doctors desk anyways because there isnt anywhere to sign." I advised that the body of the letter ask for doctor to "advise as to whether the patient may come off their therapy...." and to "fax response". She verbalizes understanding.

## 2016-11-28 NOTE — Telephone Encounter (Signed)
Called Dr. Delanna Ahmadi office and left another message that we are still in need of an answer regarding pt holding Xarelto for 2 days prior to colonoscopy. Left our office number, our fax number and my name.

## 2016-11-28 NOTE — Telephone Encounter (Signed)
Received a call from [facility] at Dr. Delanna Ahmadi office. She told me over the phone it was OK for pt to hold Xarelto for 2 days prior to his colonoscopy according to their "protocol".  I asked her to send me written confirmation via fax and gave her our fax number. She said she would do so and have the PA sign since Dr. Hilma Favors is out of the office.

## 2016-11-29 NOTE — Telephone Encounter (Signed)
Spoke to pt and advised him to hold Xarelto 2 days prior to procedure. He expressed understanding.

## 2016-11-29 NOTE — Telephone Encounter (Signed)
Received fax from [facility] (Dr. Delanna Ahmadi office) stating OK for pt to hold [facility] for 2 days prior to procedure.  Signed by Charleen Kirks, PA-C.   Called patient and left message for him to call me back.

## 2016-12-20 ENCOUNTER — Other Ambulatory Visit: Payer: Self-pay | Admitting: "Endocrinology

## 2016-12-21 ENCOUNTER — Encounter: Payer: PPO | Admitting: Gastroenterology

## 2016-12-22 ENCOUNTER — Encounter: Payer: Self-pay | Admitting: Gastroenterology

## 2016-12-25 DIAGNOSIS — Z794 Long term (current) use of insulin: Secondary | ICD-10-CM | POA: Diagnosis not present

## 2016-12-25 DIAGNOSIS — E1122 Type 2 diabetes mellitus with diabetic chronic kidney disease: Secondary | ICD-10-CM | POA: Diagnosis not present

## 2016-12-25 DIAGNOSIS — Z23 Encounter for immunization: Secondary | ICD-10-CM | POA: Diagnosis not present

## 2016-12-25 DIAGNOSIS — N182 Chronic kidney disease, stage 2 (mild): Secondary | ICD-10-CM | POA: Diagnosis not present

## 2016-12-25 DIAGNOSIS — M1991 Primary osteoarthritis, unspecified site: Secondary | ICD-10-CM | POA: Diagnosis not present

## 2016-12-25 DIAGNOSIS — E6609 Other obesity due to excess calories: Secondary | ICD-10-CM | POA: Diagnosis not present

## 2016-12-25 DIAGNOSIS — Z6831 Body mass index (BMI) 31.0-31.9, adult: Secondary | ICD-10-CM | POA: Diagnosis not present

## 2016-12-25 DIAGNOSIS — Z1389 Encounter for screening for other disorder: Secondary | ICD-10-CM | POA: Diagnosis not present

## 2016-12-26 ENCOUNTER — Other Ambulatory Visit: Payer: Self-pay | Admitting: "Endocrinology

## 2016-12-26 ENCOUNTER — Encounter: Payer: Self-pay | Admitting: "Endocrinology

## 2016-12-26 ENCOUNTER — Ambulatory Visit (INDEPENDENT_AMBULATORY_CARE_PROVIDER_SITE_OTHER): Payer: PPO | Admitting: "Endocrinology

## 2016-12-26 VITALS — BP 132/66 | HR 64 | Ht 73.0 in | Wt 231.0 lb

## 2016-12-26 DIAGNOSIS — E1122 Type 2 diabetes mellitus with diabetic chronic kidney disease: Secondary | ICD-10-CM

## 2016-12-26 DIAGNOSIS — I10 Essential (primary) hypertension: Secondary | ICD-10-CM

## 2016-12-26 DIAGNOSIS — N182 Chronic kidney disease, stage 2 (mild): Secondary | ICD-10-CM | POA: Diagnosis not present

## 2016-12-26 DIAGNOSIS — Z794 Long term (current) use of insulin: Secondary | ICD-10-CM

## 2016-12-26 DIAGNOSIS — E782 Mixed hyperlipidemia: Secondary | ICD-10-CM

## 2016-12-26 LAB — RENAL FUNCTION PANEL
ALBUMIN MSPROF: 4.4 g/dL (ref 3.6–5.1)
BUN / CREAT RATIO: 14 (calc) (ref 6–22)
BUN: 19 mg/dL (ref 7–25)
CO2: 31 mmol/L (ref 20–32)
CREATININE: 1.38 mg/dL — AB (ref 0.70–1.18)
Calcium: 9.5 mg/dL (ref 8.6–10.3)
Chloride: 106 mmol/L (ref 98–110)
Glucose, Bld: 125 mg/dL (ref 65–139)
PHOSPHORUS: 2.6 mg/dL (ref 2.1–4.3)
Potassium: 4.2 mmol/L (ref 3.5–5.3)
Sodium: 143 mmol/L (ref 135–146)

## 2016-12-26 LAB — HEMOGLOBIN A1C
EAG (MMOL/L): 9.3 (calc)
Hgb A1c MFr Bld: 7.5 % of total Hgb — ABNORMAL HIGH (ref <5.7)
MEAN PLASMA GLUCOSE: 169 (calc)

## 2016-12-26 NOTE — Progress Notes (Signed)
Subjective:    Patient ID: Hector Rose, male    DOB: 1940/08/16,    Past Medical History:  Diagnosis Date  . Asthma 04-11-11   AS CHILD ONLY  . BPH (benign prostatic hypertrophy) with urinary obstruction   . Carpal tunnel syndrome   . Difficult intubation 04-11-11   08-11-97 some issues with intubation/record with chart  . DJD (degenerative joint disease)   . DM (diabetes mellitus) ([place]) 04-11-11   dx. 8-10 yrs. -oral meds ,cbg's(120-150)  . DVT femoral (deep venous thrombosis) with thrombophlebitis ([place]) 04-11-11   ?'09-tx. warfarin, pt being bridge with Lovenox 59m x2daily,x 5days  . GERD (gastroesophageal reflux disease) 04-11-11   mild, no med in 1 month  . Gout 04-11-11    ankle 2 yrs ago  . Hemorrhoids   . Hypercholesteremia   . Hypertension   . Internal hemorrhoids   . Keloid 04-11-11   multiple-arms,back, chest  . Pulmonary nodule 04-11-11   "PT. NOT AWARE" -denies problems with breathing  . Renal calculus   . Thyroid disease    hypothyroid   Past Surgical History:  Procedure Laterality Date  . cataract surgery  12/2013  . COSMETIC SURGERY  2000   Keloid injections  . CYSTOSCOPY WITH BIOPSY  04/14/2011   Procedure: CYSTOSCOPY WITH BIOPSY;  Surgeon: LDutch Gray MD;  Location: [facility];  Service: Urology;  Laterality: N/A;  Bladder Biopsies   . CYSTOSCOPY/RETROGRADE/URETEROSCOPY  04/14/2011   Procedure: CYSTOSCOPY/RETROGRADE/URETEROSCOPY;  Surgeon: LDutch Gray MD;  Location: [facility];  Service: Urology;  Laterality: Bilateral;  (Bil) RPG   . ortho surgery on fingers  2005 & 2008   Dr. SLorelle Formosafinger release  . PROSTATECTOMY  1999   Dr. DRosana Hoes  Social History   Social History  . Marital status: Married    Spouse name: N/A  . Number of children: 2  . Years of education: N/A   Occupational History  . retired from [employer] . Smoking status: Never Smoker  . Smokeless tobacco: Never Used  . Alcohol use No  . Drug use: No  .  Sexual activity: Yes   Other Topics Concern  . None   Social History Narrative  . None   Outpatient Encounter Prescriptions as of 12/26/2016  Medication Sig  . acetaminophen (TYLENOL) 500 MG tablet Take 1,000 mg by mouth every 6 (six) hours as needed for moderate pain.  .Marland Kitchenallopurinol (ZYLOPRIM) 300 MG tablet TAKE 1 TABLET BY MOUTH ONCE DAILY  . amLODipine (NORVASC) 10 MG tablet TAKE 1 TABLET EVERY MORNING  . BD VEO INSULIN SYR ULTRAFINE 31G X 15/64" 1 ML MISC USE TWICE A DAY AS DIRECTED  . diclofenac (VOLTAREN) 75 MG EC tablet Take 75 mg by mouth 2 (two) times daily.  . fluticasone (FLONASE) 50 MCG/ACT nasal spray Place 1 spray into both nostrils daily as needed for allergies or rhinitis.  .Marland Kitchengabapentin (NEURONTIN) 300 MG capsule Take 1 capsule (300 mg total) by mouth 3 (three) times daily. (Patient taking differently: Take 300 mg by mouth 2 (two) times daily. )  . metFORMIN (GLUCOPHAGE-XR) 500 MG 24 hr tablet Take 1 tablet (500 mg total) by mouth daily with breakfast.  . methocarbamol (ROBAXIN) 500 MG tablet Take 1 tablet (500 mg total) by mouth 2 (two) times daily.  . metoprolol (LOPRESSOR) 50 MG tablet TAKE 1 TABLET TWICE DAILY  . Multiple Vitamin (MULITIVITAMIN WITH MINERALS) TABS Take 1 tablet by mouth daily.  Mens One a Day.  Marland Kitchen NOVOLIN 70/30 (70-30) 100 UNIT/ML injection INJECT 40 UNITS INTO THE SKIN 2 TIMES A DAY BEFORE MEAL INJECT 40 UNITS BREAKFAST& 30 UNITS SUPPER  . omeprazole (PRILOSEC) 20 MG capsule TAKE ONE CAPSULE BY MOUTH ONCE DAILY AS NEEDED FOR STOMACH ACID  . ONE TOUCH ULTRA TEST test strip USE TO TEST BLOOD SUGAR TWICE DAILY.  Marland Kitchen ONETOUCH DELICA LANCETS 38L MISC USE TO TEST BLOOD SUGAR TWICE DAILY.  Marland Kitchen pseudoephedrine-acetaminophen (TYLENOL SINUS) 30-500 MG TABS tablet Take 1 tablet by mouth every 4 (four) hours as needed.  . rivaroxaban (XARELTO) 20 MG TABS tablet Take 20 mg by mouth daily with supper.  Manus Gunning BOWEL PREP KIT 17.5-3.13-1.6 GM/180ML SOLN Suprep-Use as  directed   No facility-administered encounter medications on file as of 12/26/2016.    ALLERGIES: Allergies  Allergen Reactions  . Ace Inhibitors Swelling    REACTION: angio edema (swelling of lips)  . Metformin And Related     itching   VACCINATION STATUS: Immunization History  Administered Date(s) Administered  . Influenza Split 11/27/2011, 12/30/2012, 01/01/2014  . Influenza Whole 01/27/2009, 11/30/2009, 11/17/2010  . Influenza, High Dose Seasonal PF 12/21/2015  . Pneumococcal Polysaccharide-23 12/02/2008    Diabetes  He presents for his follow-up diabetic visit. He has type 2 diabetes mellitus. Onset time: Diagnosed approximately  at age 3. His disease course has been improving. Pertinent negatives for hypoglycemia include no confusion, headaches, pallor or seizures. Pertinent negatives for diabetes include no chest pain, no fatigue, no polydipsia, no polyphagia, no polyuria and no weakness. Symptoms are improving. Risk factors for coronary artery disease include diabetes mellitus, dyslipidemia, hypertension, male sex and sedentary lifestyle. Current diabetic treatment includes insulin injections. His weight is increasing steadily. He is following a generally unhealthy diet. Meal planning includes ADA exchanges. He participates in exercise intermittently. His breakfast blood glucose range is generally 140-180 mg/dl. His dinner blood glucose range is generally 180-200 mg/dl. His overall blood glucose range is 180-200 mg/dl.  Hypertension  This is a chronic problem. The current episode started more than 1 year ago. The problem is controlled. Pertinent negatives include no chest pain, headaches, neck pain, palpitations or shortness of breath. Past treatments include calcium channel blockers.  Hyperlipidemia  This is a chronic problem. The current episode started more than 1 year ago. The problem is uncontrolled. Pertinent negatives include no chest pain, myalgias or shortness of breath.  Current antihyperlipidemic treatment includes bile acid squestrants. Risk factors for coronary artery disease include dyslipidemia and diabetes mellitus.     Review of Systems  Constitutional: Negative for fatigue and unexpected weight change.  HENT: Negative for dental problem, mouth sores and trouble swallowing.   Eyes: Negative for visual disturbance.  Respiratory: Negative for cough, choking, chest tightness, shortness of breath and wheezing.   Cardiovascular: Negative for chest pain, palpitations and leg swelling.  Gastrointestinal: Negative for abdominal distention, abdominal pain, constipation, diarrhea, nausea and vomiting.  Endocrine: Negative for polydipsia, polyphagia and polyuria.  Genitourinary: Negative for dysuria, flank pain, hematuria and urgency.  Musculoskeletal: Negative for back pain, gait problem, joint swelling, myalgias and neck pain.  Skin: Negative for pallor, rash and wound.  Neurological: Negative for seizures, syncope, weakness, numbness and headaches.  Psychiatric/Behavioral: Negative for confusion, dysphoric mood, hallucinations and suicidal ideas.    Objective:    BP 132/66   Pulse 64   Ht '6\' 1"'  (1.854 m)   Wt 231 lb (104.8 kg)   BMI 30.48 kg/m  Wt Readings from Last 3 Encounters:  12/26/16 231 lb (104.8 kg)  11/01/16 232 lb (105.2 kg)  09/21/16 231 lb (104.8 kg)    Physical Exam  Constitutional: He is oriented to person, place, and time. He appears well-developed and well-nourished. He is cooperative.  HENT:  Head: Normocephalic and atraumatic.  Eyes: EOM are normal.  Neck: Normal range of motion. Neck supple. No tracheal deviation present. No thyromegaly present.  Cardiovascular: Normal rate and normal heart sounds.  Exam reveals no gallop.   No murmur heard. Pulses:      Carotid pulses are 2+ on the right side.      Radial pulses are 2+ on the right side.       Dorsalis pedis pulses are 2+ on the right side.       Posterior tibial pulses  are 2+ on the right side.  Pulmonary/Chest: Breath sounds normal. No respiratory distress. He has no wheezes.  Abdominal: Soft. Bowel sounds are normal. He exhibits no distension. There is no hepatosplenomegaly. There is no tenderness. There is no guarding and no CVA tenderness.  Musculoskeletal: He exhibits no edema.       Right shoulder: He exhibits no swelling and no deformity.  Lymphadenopathy:       Head (right side): No submental and no submandibular adenopathy present.       Head (left side): No submental and no submandibular adenopathy present.       Right cervical: No superficial cervical adenopathy present.      Left cervical: No superficial cervical adenopathy present.       Right: No supraclavicular adenopathy present.       Left: No supraclavicular adenopathy present.  Neurological: He is alert and oriented to person, place, and time. He has normal strength and normal reflexes. No cranial nerve deficit or sensory deficit. Gait normal.  Skin: Skin is warm and dry. No rash noted. No cyanosis. Nails show no clubbing.  Psychiatric: He has a normal mood and affect. His speech is normal and behavior is normal. Judgment and thought content normal. Cognition and memory are normal.   Recent Results (from the past 2160 hour(s))  Renal function panel     Status: Abnormal   Collection Time: 12/25/16  9:03 AM  Result Value Ref Range   Glucose, Bld 125 65 - 139 mg/dL    Comment: .        Non-fasting reference interval .    BUN 19 7 - 25 mg/dL   Creat 1.38 (H) 0.70 - 1.18 mg/dL    Comment: For patients >23 years of age, the reference limit for Creatinine is approximately 13% higher for people identified as African-American. .    BUN/Creatinine Ratio 14 6 - 22 (calc)   Sodium 143 135 - 146 mmol/L   Potassium 4.2 3.5 - 5.3 mmol/L   Chloride 106 98 - 110 mmol/L   CO2 31 20 - 32 mmol/L   Calcium 9.5 8.6 - 10.3 mg/dL   Phosphorus 2.6 2.1 - 4.3 mg/dL   Albumin 4.4 3.6 - 5.1 g/dL   Hemoglobin A1c     Status: Abnormal   Collection Time: 12/25/16  9:03 AM  Result Value Ref Range   Hgb A1c MFr Bld 7.5 (H) <5.7 % of total Hgb    Comment: For someone without known diabetes, a hemoglobin A1c value of 6.5% or greater indicates that they may have  diabetes and this should be confirmed with a follow-up  test. . For  someone with known diabetes, a value <7% indicates  that their diabetes is well controlled and a value  greater than or equal to 7% indicates suboptimal  control. A1c targets should be individualized based on  duration of diabetes, age, comorbid conditions, and  other considerations. . Currently, no consensus exists regarding use of hemoglobin A1c for diagnosis of diabetes for children. .    Mean Plasma Glucose 169 (calc)   eAG (mmol/L) 9.3 (calc)   Lipid Panel     Component Value Date/Time   CHOL 165 03/17/2016 0714   TRIG 142 03/17/2016 0714   HDL 40 (L) 03/17/2016 0714   CHOLHDL 4.1 03/17/2016 0714   VLDL 28 03/17/2016 0714   LDLCALC 97 03/17/2016 0714   LDLDIRECT 147.5 06/10/2012 0930     Assessment & Plan:   1. Type 2 diabetes mellitus with stage 2 chronic kidney disease He came with improved  Fasting and  target suppertime blood glucose profile and his A1c is  better at 7.5% from 8.1% .  Patient is advised to stick to a routine mealtimes to eat 3 meals  a day and avoid unnecessary snacks ( to snack only to correct hypoglycemia).  -  Suggestion is made for him to avoid simple carbohydrates  from his diet including Cakes, Sweet Desserts / Pastries, Ice Cream, Soda (diet and regular), Sweet Tea, Candies, Chips, Cookies, Store Bought Juices, Alcohol in Excess of  1-2 drinks a day, Artificial Sweeteners, and "Sugar-free" Products. This will help patient to have stable blood glucose profile and potentially avoid unintended weight gain.  I advised him to  Continue  Novolin 70/30  40 units with breakfast and continue at 30 units with supper when  pre-meal blood glucose is above 90.  - He is advised to continue glucose monitoring at least 2 times daily-before breakfast and at supper and anytime as needed. - Patient is instructed to call back with extremes of blood glucose readings  less than 70 or greater than 300 mg/dl. - His renal function is improving, however he did not tolerate full dose metformin in the past.  - We will attempt a low-dose metformin 500 mg ER daily with supper.  2. Mixed hyperlipidemia  During his last visit he is LDL was above target at 130 and HDL at 40. Unfortunately Mr. Drew does not tolerate statins even at low dose of 10 mg of pravastatin. He is advised to continue with omega-3 fatty acids ,avoid butter and fried food and exercise regularly.  3. Essential hypertension  Blood pressure today  is controlled to 132/66 improving from  142/90. Patient is advised to continue amlodipine.  - Time spent with the patient: 25 min, of which >50% was spent in reviewing his sugar logs , discussing his hypo- and hyper-glycemic episodes, reviewing his current and  previous labs and insulin doses and developing a plan to avoid hypo- and hyper-glycemia.   Follow up plan: Return in about 3 months (around 03/28/2017).  Glade Lloyd, MD Phone: [phone]  Fax: [phone]  -  This note was partially dictated with voice recognition software. Similar sounding words can be transcribed inadequately or may not  be corrected upon review.  12/26/2016, 3:06 PM

## 2016-12-28 ENCOUNTER — Other Ambulatory Visit: Payer: Self-pay

## 2016-12-28 MED ORDER — METFORMIN HCL ER 500 MG PO TB24: 500.0000 mg | ORAL_TABLET | Freq: Every day | ORAL | 2 refills | Status: DC

## 2017-01-02 ENCOUNTER — Telehealth: Payer: Self-pay

## 2017-01-02 ENCOUNTER — Other Ambulatory Visit: Payer: Self-pay

## 2017-01-02 DIAGNOSIS — Z1211 Encounter for screening for malignant neoplasm of colon: Secondary | ICD-10-CM

## 2017-01-02 NOTE — Telephone Encounter (Signed)
Patient notified that due to a history of difficult intubation his colonoscopy scheduled for tomorrow will need to be cancelled and rescheduled to the hospital.  Patient verbalized understanding to resume his Alen Blew and that he has been rescheduled for 02/05/17 9:30 at [facility].  I mailed him new instructions.  He will call if he has any questions.  He verbalized understanding to hold his Xeralto starting 02/03/17.

## 2017-01-03 ENCOUNTER — Encounter: Payer: PPO | Admitting: Gastroenterology

## 2017-01-03 DIAGNOSIS — Z23 Encounter for immunization: Secondary | ICD-10-CM | POA: Diagnosis not present

## 2017-01-08 DIAGNOSIS — L910 Hypertrophic scar: Secondary | ICD-10-CM | POA: Diagnosis not present

## 2017-01-08 DIAGNOSIS — L218 Other seborrheic dermatitis: Secondary | ICD-10-CM | POA: Diagnosis not present

## 2017-01-12 ENCOUNTER — Other Ambulatory Visit: Payer: Self-pay | Admitting: "Endocrinology

## 2017-01-16 ENCOUNTER — Other Ambulatory Visit (HOSPITAL_COMMUNITY): Payer: Self-pay | Admitting: Urology

## 2017-01-16 DIAGNOSIS — D49511 Neoplasm of unspecified behavior of right kidney: Secondary | ICD-10-CM

## 2017-01-23 ENCOUNTER — Other Ambulatory Visit: Payer: Self-pay

## 2017-01-23 MED ORDER — METFORMIN HCL ER 500 MG PO TB24: 500.0000 mg | ORAL_TABLET | Freq: Every day | ORAL | 0 refills | Status: DC

## 2017-01-25 ENCOUNTER — Encounter (HOSPITAL_COMMUNITY): Payer: Self-pay | Admitting: *Deleted

## 2017-01-25 ENCOUNTER — Other Ambulatory Visit: Payer: Self-pay

## 2017-02-05 ENCOUNTER — Ambulatory Visit (HOSPITAL_COMMUNITY)
Admission: RE | Admit: 2017-02-05 | Discharge: 2017-02-05 | Disposition: A | Discharge status: Home / Self Care | Payer: PPO | Source: Ambulatory Visit | Attending: Gastroenterology | Admitting: Gastroenterology

## 2017-02-05 ENCOUNTER — Other Ambulatory Visit: Payer: Self-pay

## 2017-02-05 ENCOUNTER — Ambulatory Visit (HOSPITAL_COMMUNITY): Payer: PPO | Admitting: Certified Registered Nurse Anesthetist

## 2017-02-05 ENCOUNTER — Encounter (HOSPITAL_COMMUNITY)
Admission: RE | Disposition: A | Discharge status: Home / Self Care | Payer: Self-pay | Source: Ambulatory Visit | Attending: Gastroenterology

## 2017-02-05 ENCOUNTER — Encounter (HOSPITAL_COMMUNITY): Payer: Self-pay

## 2017-02-05 DIAGNOSIS — E039 Hypothyroidism, unspecified: Secondary | ICD-10-CM | POA: Insufficient documentation

## 2017-02-05 DIAGNOSIS — Z7984 Long term (current) use of oral hypoglycemic drugs: Secondary | ICD-10-CM | POA: Insufficient documentation

## 2017-02-05 DIAGNOSIS — J45909 Unspecified asthma, uncomplicated: Secondary | ICD-10-CM | POA: Insufficient documentation

## 2017-02-05 DIAGNOSIS — Z87442 Personal history of urinary calculi: Secondary | ICD-10-CM | POA: Diagnosis not present

## 2017-02-05 DIAGNOSIS — Z1211 Encounter for screening for malignant neoplasm of colon: Secondary | ICD-10-CM

## 2017-02-05 DIAGNOSIS — K635 Polyp of colon: Secondary | ICD-10-CM | POA: Diagnosis not present

## 2017-02-05 DIAGNOSIS — D123 Benign neoplasm of transverse colon: Secondary | ICD-10-CM | POA: Diagnosis not present

## 2017-02-05 DIAGNOSIS — K219 Gastro-esophageal reflux disease without esophagitis: Secondary | ICD-10-CM | POA: Diagnosis not present

## 2017-02-05 DIAGNOSIS — M109 Gout, unspecified: Secondary | ICD-10-CM | POA: Insufficient documentation

## 2017-02-05 DIAGNOSIS — Z86718 Personal history of other venous thrombosis and embolism: Secondary | ICD-10-CM | POA: Insufficient documentation

## 2017-02-05 DIAGNOSIS — K640 First degree hemorrhoids: Secondary | ICD-10-CM | POA: Diagnosis not present

## 2017-02-05 DIAGNOSIS — Z794 Long term (current) use of insulin: Secondary | ICD-10-CM | POA: Diagnosis not present

## 2017-02-05 DIAGNOSIS — I10 Essential (primary) hypertension: Secondary | ICD-10-CM | POA: Insufficient documentation

## 2017-02-05 DIAGNOSIS — Z79899 Other long term (current) drug therapy: Secondary | ICD-10-CM | POA: Diagnosis not present

## 2017-02-05 DIAGNOSIS — E7800 Pure hypercholesterolemia, unspecified: Secondary | ICD-10-CM | POA: Diagnosis not present

## 2017-02-05 DIAGNOSIS — Z7902 Long term (current) use of antithrombotics/antiplatelets: Secondary | ICD-10-CM | POA: Diagnosis not present

## 2017-02-05 DIAGNOSIS — E119 Type 2 diabetes mellitus without complications: Secondary | ICD-10-CM | POA: Diagnosis not present

## 2017-02-05 HISTORY — PX: COLONOSCOPY WITH PROPOFOL: SHX5780

## 2017-02-05 LAB — GLUCOSE, CAPILLARY: GLUCOSE-CAPILLARY: 98 mg/dL (ref 65–99)

## 2017-02-05 SURGERY — COLONOSCOPY WITH PROPOFOL
Anesthesia: Monitor Anesthesia Care

## 2017-02-05 MED ORDER — ONDANSETRON HCL 4 MG/2ML IJ SOLN
INTRAMUSCULAR | Status: DC | PRN
  Administered 2017-02-05: 4 mg via INTRAVENOUS

## 2017-02-05 MED ORDER — LACTATED RINGERS IV SOLN
INTRAVENOUS | Status: DC
  Administered 2017-02-05: 09:00:00 via INTRAVENOUS

## 2017-02-05 MED ORDER — ONDANSETRON HCL 4 MG/2ML IJ SOLN
INTRAMUSCULAR | Status: AC
  Filled 2017-02-05: qty 2

## 2017-02-05 MED ORDER — SODIUM CHLORIDE 0.9 % IV SOLN: INTRAVENOUS | Status: DC

## 2017-02-05 MED ORDER — PROPOFOL 10 MG/ML IV BOLUS
INTRAVENOUS | Status: DC | PRN
  Administered 2017-02-05 (×2): 10 mg via INTRAVENOUS
  Administered 2017-02-05: 20 mg via INTRAVENOUS
  Administered 2017-02-05 (×2): 10 mg via INTRAVENOUS

## 2017-02-05 MED ORDER — PROPOFOL 500 MG/50ML IV EMUL
INTRAVENOUS | Status: DC | PRN
  Administered 2017-02-05: 50 ug/kg/min via INTRAVENOUS

## 2017-02-05 MED ORDER — LIDOCAINE 2% (20 MG/ML) 5 ML SYRINGE
INTRAMUSCULAR | Status: AC
  Filled 2017-02-05: qty 5

## 2017-02-05 MED ORDER — PROPOFOL 10 MG/ML IV BOLUS
INTRAVENOUS | Status: AC
  Filled 2017-02-05: qty 60

## 2017-02-05 MED ORDER — LIDOCAINE 2% (20 MG/ML) 5 ML SYRINGE
INTRAMUSCULAR | Status: DC | PRN
  Administered 2017-02-05: 100 mg via INTRAVENOUS

## 2017-02-05 SURGICAL SUPPLY — 22 items

## 2017-02-05 NOTE — Anesthesia Postprocedure Evaluation (Signed)
Anesthesia Post Note  Patient: Grace Blight  Procedure(s) Performed: COLONOSCOPY WITH PROPOFOL (N/A )     Patient location during evaluation: Endoscopy Anesthesia Type: MAC Level of consciousness: awake and alert Pain management: pain level controlled Vital Signs Assessment: post-procedure vital signs reviewed and stable Respiratory status: spontaneous breathing, nonlabored ventilation, respiratory function stable and patient connected to nasal cannula oxygen Cardiovascular status: stable and blood pressure returned to baseline Postop Assessment: no apparent nausea or vomiting Anesthetic complications: no    Last Vitals:  Vitals:   02/05/17 0950 02/05/17 1000  BP: 129/69   Pulse: (!) 54 (!) 52  Resp: 15 18  Temp:    SpO2: 100% 98%    Last Pain:  Vitals:   02/05/17 0821  TempSrc: Oral                 Emilyn Ruble,JAMES TERRILL

## 2017-02-05 NOTE — Discharge Instructions (Signed)

## 2017-02-05 NOTE — Anesthesia Preprocedure Evaluation (Addendum)
Anesthesia Evaluation  Patient identified by MRN, date of birth, ID band Patient awake    Reviewed: Allergy & Precautions, NPO status , Patient's Chart, lab work & pertinent test results  History of Anesthesia Complications (+) DIFFICULT AIRWAY and history of anesthetic complications  Airway Mallampati: II  TM Distance: >3 FB Neck ROM: Full    Dental  (+) Teeth Intact, Chipped   Pulmonary asthma ,    breath sounds clear to auscultation       Cardiovascular hypertension, + Peripheral Vascular Disease   Rhythm:Regular Rate:Normal     Neuro/Psych    GI/Hepatic GERD  ,  Endo/Other  diabetes  Renal/GU Renal disease     Musculoskeletal  (+) Arthritis ,   Abdominal   Peds  Hematology   Anesthesia Other Findings   Reproductive/Obstetrics                            Anesthesia Physical Anesthesia Plan  ASA: III  Anesthesia Plan:    Post-op Pain Management:    Induction: Intravenous  PONV Risk Score and Plan: 1 and Treatment may vary due to age or medical condition  Airway Management Planned: Simple Face Mask and Natural Airway  Additional Equipment:   Intra-op Plan:   Post-operative Plan:   Informed Consent: I have reviewed the patients History and Physical, chart, labs and discussed the procedure including the risks, benefits and alternatives for the proposed anesthesia with the patient or authorized representative who has indicated his/her understanding and acceptance.     Plan Discussed with: CRNA  Anesthesia Plan Comments:        Anesthesia Quick Evaluation

## 2017-02-05 NOTE — Transfer of Care (Signed)
Immediate Anesthesia Transfer of Care Note  Patient: Hector Rose  Procedure(s) Performed: COLONOSCOPY WITH PROPOFOL (N/A )  Patient Location: Endoscopy Unit  Anesthesia Type:MAC  Level of Consciousness: sedated and patient cooperative  Airway & Oxygen Therapy: Patient Spontanous Breathing and Patient connected to face mask  Post-op Assessment: Report given to RN and Post -op Vital signs reviewed and stable  Post vital signs: Reviewed and stable  Last Vitals:  Vitals:   02/05/17 0821  BP: (!) 161/71  Pulse: (!) 57  Resp: 15  Temp: 37 C  SpO2: 100%    Last Pain:  Vitals:   02/05/17 0821  TempSrc: Oral         Complications: No apparent anesthesia complications

## 2017-02-05 NOTE — Op Note (Signed)
[facility] Patient Name: Hector Rose Procedure Date: 02/05/2017 MRN: [id number] Attending MD: Ladene Artist , MD Date of Birth: 1941/02/24 CSN: [id number] Age: 76 Admit Type: Outpatient Procedure:                Colonoscopy Indications:              Screening for colorectal malignant neoplasm Providers:                Pricilla Riffle. Fuller Plan, MD, Jobe Igo, RN, Cletis Athens, Technician Referring MD:             Halford Chessman, MD Medicines:                Monitored Anesthesia Care Complications:            No immediate complications. Estimated blood loss:                            None. Estimated Blood Loss:     Estimated blood loss: none. Procedure:                Pre-Anesthesia Assessment:                           - Prior to the procedure, a History and Physical                            was performed, and patient medications and                            allergies were reviewed. The patient's tolerance of                            previous anesthesia was also reviewed. The risks                            and benefits of the procedure and the sedation                            options and risks were discussed with the patient.                            All questions were answered, and informed consent                            was obtained. Prior Anticoagulants: The patient has                            taken Xarelto (rivaroxaban), last dose was 2 days                            prior to procedure. ASA Grade Assessment: III - A  patient with severe systemic disease. After                            reviewing the risks and benefits, the patient was                            deemed in satisfactory condition to undergo the                            procedure.                           After obtaining informed consent, the colonoscope                            was passed under direct vision.  Throughout the                            procedure, the patient's blood pressure, pulse, and                            oxygen saturations were monitored continuously. The                            EC-[id number] ([id number]) scope was introduced through                            the anus and advanced to the the cecum, identified                            by appendiceal orifice and ileocecal valve. The                            ileocecal valve, appendiceal orifice, and rectum                            were photographed. The quality of the bowel                            preparation was good. The colonoscopy was performed                            without difficulty. The patient tolerated the                            procedure well. Scope In: 9:24:47 AM Scope Out: 9:39:05 AM Scope Withdrawal Time: 0 hours 11 minutes 7 seconds  Total Procedure Duration: 0 hours 14 minutes 18 seconds  Findings:      The perianal and digital rectal examinations were normal.      Four sessile polyps were found in the transverse colon. The polyps were       5 to 6 mm in size. These polyps were removed with a cold snare.       Resection and retrieval were complete.      Internal hemorrhoids  were found during retroflexion. The hemorrhoids       were small and Grade I (internal hemorrhoids that do not prolapse).      The exam was otherwise without abnormality on direct and retroflexion       views. Impression:               - Four 5 to 6 mm polyps in the transverse colon,                            removed with a cold snare. Resected and retrieved.                           - Internal hemorrhoids.                           - The examination was otherwise normal on direct                            and retroflexion views. Moderate Sedation:      N/A- Per Anesthesia Care Recommendation:           - Consider repeat colonoscopy in 5 years for                            surveillance if polyps are precancerous,  otherwise                            no plans for future screening colonoscopies due to                            age.                           - Resume Xarelto (rivaroxaban) tomorrow at prior                            dose.                           - Patient has a contact number available for                            emergencies. The signs and symptoms of potential                            delayed complications were discussed with the                            patient. Return to normal activities tomorrow.                            Written discharge instructions were provided to the                            patient.                           -  Resume previous diet.                           - Continue present medications.                           - Await pathology results.                           - No aspirin, ibuprofen, naproxen, or other                            non-steroidal anti-inflammatory drugs for 2 weeks                            after polyp removal. Procedure Code(s):        --- Professional ---                           [phone], Colonoscopy, flexible; with removal of                            tumor(s), polyp(s), or other lesion(s) by snare                            technique Diagnosis Code(s):        --- Professional ---                           Z12.11, Encounter for screening for malignant                            neoplasm of colon                           D12.3, Benign neoplasm of transverse colon (hepatic                            flexure or splenic flexure)                           K64.0, First degree hemorrhoids CPT copyright 2016 [employer]. All rights reserved. The codes documented in this report are preliminary and upon coder review may  be revised to meet current compliance requirements. Ladene Artist, MD 02/05/2017 9:45:13 AM This report has been signed electronically. Number of Addenda: 0

## 2017-02-05 NOTE — H&P (Signed)
History of Present Illness: This is a 76 year old male referred by Hector Sites, Hector Rose for the evaluation of CRC screening maintained on Xarelto. Colonoscopy performed in August 2008 showed only internal hemorrhoids. He has no GI complaints. Denies weight loss, abdominal pain, constipation, diarrhea, change in stool caliber, melena, hematochezia, nausea, vomiting, dysphagia, reflux symptoms, chest pain.        Allergies  Allergen Reactions  . Ace Inhibitors Swelling    REACTION: angio edema (swelling of lips)  . Metformin And Related     itching         Outpatient Medications Prior to Visit  Medication Sig Dispense Refill  . acetaminophen (TYLENOL) 500 MG tablet Take 1,000 mg by mouth every 6 (six) hours as needed for moderate pain.    Marland Kitchen allopurinol (ZYLOPRIM) 300 MG tablet TAKE 1 TABLET BY MOUTH ONCE DAILY 90 tablet 0  . amLODipine (NORVASC) 10 MG tablet TAKE 1 TABLET EVERY MORNING 90 tablet 0  . BD INSULIN SYRINGE ULTRAFINE 31G X 15/64" 1 ML MISC USE TWICE A DAY AS DIRECTED 100 each 5  . diclofenac (VOLTAREN) 75 MG EC tablet Take 75 mg by mouth 2 (two) times daily.    . fluticasone (FLONASE) 50 MCG/ACT nasal spray Place 1 spray into both nostrils daily as needed for allergies or rhinitis.    Marland Kitchen gabapentin (NEURONTIN) 300 MG capsule Take 1 capsule (300 mg total) by mouth 3 (three) times daily. (Patient taking differently: Take 300 mg by mouth 2 (two) times daily. ) 30 capsule 0  . insulin NPH-regular Human (NOVOLIN 70/30) (70-30) 100 UNIT/ML injection Inject 40 Units into the skin 2 (two) times daily before a meal. 40 units with breakfast and 30 units with supper 30 mL 2  . metFORMIN (GLUCOPHAGE-XR) 500 MG 24 hr tablet Take 1 tablet (500 mg total) by mouth daily with breakfast. 30 tablet 2  . methocarbamol (ROBAXIN) 500 MG tablet Take 1 tablet (500 mg total) by mouth 2 (two) times daily. 14 tablet 0  . metoprolol (LOPRESSOR) 50 MG tablet TAKE 1 TABLET TWICE DAILY 180 tablet 0  .  Multiple Vitamin (MULITIVITAMIN WITH MINERALS) TABS Take 1 tablet by mouth daily. Mens One a Day.    Marland Kitchen omeprazole (PRILOSEC) 20 MG capsule TAKE ONE CAPSULE BY MOUTH ONCE DAILY AS NEEDED FOR STOMACH ACID 30 capsule 5  . ONE TOUCH ULTRA TEST test strip USE TO TEST BLOOD SUGAR TWICE DAILY. 100 each 5  . ONETOUCH DELICA LANCETS 25K MISC USE TO TEST BLOOD SUGAR TWICE DAILY. 100 each 5  . pseudoephedrine-acetaminophen (TYLENOL SINUS) 30-500 MG TABS tablet Take 1 tablet by mouth every 4 (four) hours as needed.    . rivaroxaban (XARELTO) 20 MG TABS tablet Take 20 mg by mouth daily with supper.    Marland Kitchen b complex vitamins tablet Take 1 tablet by mouth daily.    . Omega-3 Fatty Acids (FISH OIL PO) Take 1 capsule by mouth daily.    . tamsulosin (FLOMAX) 0.4 MG CAPS capsule Take 0.4 mg by mouth.     No facility-administered medications prior to visit.        Past Medical History:  Diagnosis Date  . Asthma 04-11-11   AS CHILD ONLY  . BPH (benign prostatic hypertrophy) with urinary obstruction   . Carpal tunnel syndrome   . Difficult intubation 04-11-11   08-11-97 some issues with intubation/record with chart  . DJD (degenerative joint disease)   . DM (diabetes mellitus) ([place]) 04-11-11  dx. 8-10 yrs. -oral meds ,cbg's(120-150)  . DVT femoral (deep venous thrombosis) with thrombophlebitis ([place]) 04-11-11   ?'09-tx. warfarin, pt being bridge with Lovenox 30mg  x2daily,x 5days  . GERD (gastroesophageal reflux disease) 04-11-11   mild, no med in 1 month  . Gout 04-11-11    ankle 2 yrs ago  . Hemorrhoids   . Hypercholesteremia   . Hypertension   . Internal hemorrhoids   . Keloid 04-11-11   multiple-arms,back, chest  . Pulmonary nodule 04-11-11   "PT. NOT AWARE" -denies problems with breathing  . Renal calculus   . Thyroid disease    hypothyroid        Past Surgical History:  Procedure Laterality Date  . cataract surgery  12/2013  . COSMETIC SURGERY  2000   Keloid  injections  . CYSTOSCOPY WITH BIOPSY  04/14/2011   Procedure: CYSTOSCOPY WITH BIOPSY;  Surgeon: Dutch Gray, Hector Rose;  Location: [facility];  Service: Urology;  Laterality: N/A;  Bladder Biopsies   . CYSTOSCOPY/RETROGRADE/URETEROSCOPY  04/14/2011   Procedure: CYSTOSCOPY/RETROGRADE/URETEROSCOPY;  Surgeon: Dutch Gray, Hector Rose;  Location: [facility];  Service: Urology;  Laterality: Bilateral;  (Bil) RPG   . ortho surgery on fingers  2005 & 2008   Dr. Lorelle Formosa finger release  . PROSTATECTOMY  1999   Dr. Rosana Hoes   Social History        Social History  . Marital status: Married    Spouse name: N/A  . Number of children: 2  . Years of education: N/A       Occupational History  . retired from [employer]  . Smoking status: Never Smoker  . Smokeless tobacco: Never Used  . Alcohol use No  . Drug use: No  . Sexual activity: Yes       Other Topics Concern  . None      Social History Narrative  . None   History reviewed. No pertinent family history.     Review of Systems: Pertinent positive and negative review of systems were noted in the above HPI section. All other review of systems were otherwise negative.    Physical Exam: General: Well developed, well nourished, no acute distress Head: Normocephalic and atraumatic Eyes:  sclerae anicteric, EOMI Ears: Normal auditory acuity Mouth: No deformity or lesions Neck: Supple, no masses or thyromegaly Lungs: Clear throughout to auscultation Heart: Regular rate and rhythm; no murmurs, rubs or bruits Abdomen: Soft, non tender and non distended. No masses, hepatosplenomegaly or hernias noted. Normal Bowel sounds Rectal: Deferred to colonoscopy Musculoskeletal: Symmetrical with no gross deformities  Skin: No lesions on visible extremities Pulses:  Normal pulses noted Extremities: No clubbing, cyanosis, edema or deformities noted Neurological: Alert oriented x 4, grossly  nonfocal Cervical Nodes:  No significant cervical adenopathy Inguinal Nodes: No significant inguinal adenopathy Psychological:  Alert and cooperative. Normal mood and affect  Assessment and Recommendations:  1. CRC screening, average risk. The risks (including bleeding, perforation, infection, missed lesions, medication reactions and possible hospitalization or surgery if complications occur), benefits, and alternatives to colonoscopy with possible biopsy and possible polypectomy were discussed with the patient and they consent to proceed.   2. Chronic anticoagulation. Hold Xarelto 2 days before procedure - will instruct when and how to resume after procedure. Low but real risk of cardiovascular event such as heart attack, stroke, embolism, thrombosis or ischemia/infarct of other organs off Xarelto explained and need to seek urgent help if this occurs. The  patient consents to proceed. Will communicate by phone or EMR with patient's prescribing provider to confirm that holding Xarelto is reasonable in this case.

## 2017-02-06 ENCOUNTER — Encounter: Payer: Self-pay | Admitting: Gastroenterology

## 2017-02-07 ENCOUNTER — Encounter (HOSPITAL_COMMUNITY): Payer: Self-pay | Admitting: Gastroenterology

## 2017-03-19 DIAGNOSIS — E1122 Type 2 diabetes mellitus with diabetic chronic kidney disease: Secondary | ICD-10-CM | POA: Diagnosis not present

## 2017-03-19 DIAGNOSIS — Z794 Long term (current) use of insulin: Secondary | ICD-10-CM | POA: Diagnosis not present

## 2017-03-19 DIAGNOSIS — N182 Chronic kidney disease, stage 2 (mild): Secondary | ICD-10-CM | POA: Diagnosis not present

## 2017-03-20 ENCOUNTER — Ambulatory Visit (HOSPITAL_COMMUNITY)
Admission: RE | Admit: 2017-03-20 | Discharge: 2017-03-20 | Disposition: A | Discharge status: Home / Self Care | Payer: PPO | Source: Ambulatory Visit | Attending: Urology | Admitting: Urology

## 2017-03-20 DIAGNOSIS — D49511 Neoplasm of unspecified behavior of right kidney: Secondary | ICD-10-CM | POA: Diagnosis not present

## 2017-03-20 DIAGNOSIS — N281 Cyst of kidney, acquired: Secondary | ICD-10-CM | POA: Diagnosis not present

## 2017-03-20 LAB — COMPLETE METABOLIC PANEL WITH GFR
AG RATIO: 1.5 (calc) (ref 1.0–2.5)
ALT: 21 U/L (ref 9–46)
AST: 23 U/L (ref 10–35)
Albumin: 4.2 g/dL (ref 3.6–5.1)
Alkaline phosphatase (APISO): 85 U/L (ref 40–115)
BILIRUBIN TOTAL: 0.4 mg/dL (ref 0.2–1.2)
BUN/Creatinine Ratio: 11 (calc) (ref 6–22)
BUN: 14 mg/dL (ref 7–25)
CHLORIDE: 103 mmol/L (ref 98–110)
CO2: 31 mmol/L (ref 20–32)
Calcium: 9.3 mg/dL (ref 8.6–10.3)
Creat: 1.29 mg/dL — ABNORMAL HIGH (ref 0.70–1.18)
GFR, Est African American: 62 mL/min/{1.73_m2} (ref >=60)
GFR, Est Non African American: 54 mL/min/{1.73_m2} — ABNORMAL LOW (ref >=60)
Globulin: 2.8 g/dL (calc) (ref 1.9–3.7)
Glucose, Bld: 172 mg/dL — ABNORMAL HIGH (ref 65–99)
POTASSIUM: 4 mmol/L (ref 3.5–5.3)
Sodium: 139 mmol/L (ref 135–146)
Total Protein: 7 g/dL (ref 6.1–8.1)

## 2017-03-20 LAB — VITAMIN D 25 HYDROXY (VIT D DEFICIENCY, FRACTURES): Vit D, 25-Hydroxy: 12 ng/mL — ABNORMAL LOW (ref 30–100)

## 2017-03-20 LAB — HEMOGLOBIN A1C
Hgb A1c MFr Bld: 7.3 % of total Hgb — ABNORMAL HIGH (ref <5.7)
Mean Plasma Glucose: 163 (calc)
eAG (mmol/L): 9 (calc)

## 2017-03-20 LAB — POCT I-STAT CREATININE: CREATININE: 1.2 mg/dL (ref 0.61–1.24)

## 2017-03-20 MED ORDER — GADOBENATE DIMEGLUMINE 529 MG/ML IV SOLN
20.0000 mL | Freq: Once | INTRAVENOUS | Status: AC | PRN
  Administered 2017-03-20: 20 mL via INTRAVENOUS

## 2017-03-28 ENCOUNTER — Encounter: Payer: Self-pay | Admitting: "Endocrinology

## 2017-03-28 ENCOUNTER — Ambulatory Visit (INDEPENDENT_AMBULATORY_CARE_PROVIDER_SITE_OTHER): Payer: PPO | Admitting: "Endocrinology

## 2017-03-28 VITALS — BP 133/72 | HR 57 | Ht 73.0 in | Wt 229.0 lb

## 2017-03-28 DIAGNOSIS — E559 Vitamin D deficiency, unspecified: Secondary | ICD-10-CM

## 2017-03-28 DIAGNOSIS — I10 Essential (primary) hypertension: Secondary | ICD-10-CM

## 2017-03-28 DIAGNOSIS — E782 Mixed hyperlipidemia: Secondary | ICD-10-CM | POA: Diagnosis not present

## 2017-03-28 DIAGNOSIS — N182 Chronic kidney disease, stage 2 (mild): Secondary | ICD-10-CM | POA: Diagnosis not present

## 2017-03-28 DIAGNOSIS — Z794 Long term (current) use of insulin: Secondary | ICD-10-CM | POA: Diagnosis not present

## 2017-03-28 DIAGNOSIS — E1122 Type 2 diabetes mellitus with diabetic chronic kidney disease: Secondary | ICD-10-CM | POA: Diagnosis not present

## 2017-03-28 MED ORDER — VITAMIN D3 125 MCG (5000 UT) PO CAPS: 5000.0000 [IU] | ORAL_CAPSULE | Freq: Every day | ORAL | 0 refills | Status: DC

## 2017-03-28 NOTE — Patient Instructions (Signed)

## 2017-03-28 NOTE — Progress Notes (Signed)
Subjective:    Patient ID: Hector Rose, male    DOB: Jan 07, 1941,    Past Medical History:  Diagnosis Date  . Asthma 04-11-11   AS CHILD ONLY  . BPH (benign prostatic hypertrophy) with urinary obstruction   . Carpal tunnel syndrome   . Difficult intubation 04-11-11   08-11-97 some issues with intubation/record with chart  . DJD (degenerative joint disease)   . DM (diabetes mellitus) ([place]) 04-11-11   dx. 8-10 yrs. -oral meds ,cbg's(120-150), type II   . DVT femoral (deep venous thrombosis) with thrombophlebitis ([place]) 04-11-11   ?'09-tx. warfarin, pt being bridge with Lovenox 73m x2daily,x 5days  . GERD (gastroesophageal reflux disease) 04-11-11   mild, no med in 1 month  . Gout 04-11-11    ankle 2 yrs ago  . Hemorrhoids   . Hypercholesteremia   . Hypertension   . Internal hemorrhoids   . Keloid 04-11-11   multiple-arms,back, chest  . Pulmonary nodule 04-11-11   "PT. NOT AWARE" -denies problems with breathing  . Renal calculus   . Thyroid disease    hypothyroid   Past Surgical History:  Procedure Laterality Date  . cataract surgery  12/2013  . COLONOSCOPY WITH PROPOFOL N/A 02/05/2017   Procedure: COLONOSCOPY WITH PROPOFOL;  Surgeon: SLadene Artist MD;  Location: [facility];  Service: Endoscopy;  Laterality: N/A;  . COSMETIC SURGERY  2000   Keloid injections  . CYSTOSCOPY WITH BIOPSY  04/14/2011   Procedure: CYSTOSCOPY WITH BIOPSY;  Surgeon: LDutch Gray MD;  Location: [facility];  Service: Urology;  Laterality: N/A;  Bladder Biopsies   . CYSTOSCOPY/RETROGRADE/URETEROSCOPY  04/14/2011   Procedure: CYSTOSCOPY/RETROGRADE/URETEROSCOPY;  Surgeon: LDutch Gray MD;  Location: [facility];  Service: Urology;  Laterality: Bilateral;  ([place]il) RPG   . ortho surgery on fingers  2005 & 2008   Dr. SLorelle Formosafinger release  . PROSTATECTOMY  1999   Dr. DRosana Hoes  Social History   Socioeconomic History  . Marital status: Married    Spouse name: None  . Number of children: 2  . Years of  education: None  . Highest education level: None  Social Needs  . Financial resource strain: None  . Food insecurity - worry: None  . Food insecurity - inability: None  . Transportation needs - medical: None  . Transportation needs - non-medical: None  Occupational History  . Occupation: retired from [employer]  . Smoking status: Never Smoker  . Smokeless tobacco: Never Used  Substance and Sexual Activity  . Alcohol use: No    Alcohol/week: 0.0 oz  . Drug use: No  . Sexual activity: Yes  Other Topics Concern  . None  Social History Narrative  . None   Outpatient Encounter Medications as of 03/28/2017  Medication Sig  . allopurinol (ZYLOPRIM) 300 MG tablet TAKE 1 TABLET BY MOUTH ONCE DAILY (Patient taking differently: Take 300 mg daily by mouth. )  . amLODipine (NORVASC) 10 MG tablet TAKE 1 TABLET EVERY MORNING  . Artificial Tear Solution (SOOTHE XP) SOLN Apply 1 drop as needed to eye (dry eyes).  .Marland Kitchenaspirin EC 81 MG tablet Take 81 mg daily by mouth.  . BD VEO INSULIN SYR ULTRAFINE 31G X 15/64" 1 ML MISC USE TWICE A DAY AS DIRECTED  . celecoxib (CELEBREX) 200 MG capsule Take 200 mg daily by mouth.   . Cholecalciferol (VITAMIN D3) 5000 units CAPS Take 1 capsule (5,000 Units total) by mouth daily.  . clobetasol (TEMOVATE) 0.05 %  external solution Apply 1 application 2 (two) times a week topically. To scalp  . cyanocobalamin 1000 MCG tablet Take 1,000 mcg daily by mouth.  . fluticasone (CUTIVATE) 0.05 % cream Apply 1 application 2 (two) times a week topically. To face for itching and flaking  . fluticasone (FLONASE) 50 MCG/ACT nasal spray Place 2 sprays daily as needed into both nostrils for allergies or rhinitis.  Marland Kitchen gabapentin (NEURONTIN) 600 MG tablet Take 600 mg 3 (three) times daily by mouth.  . Glucosamine HCl 1000 MG TABS Take 1,000 mg daily by mouth.  . metFORMIN (GLUCOPHAGE-XR) 500 MG 24 hr tablet Take 1 tablet (500 mg total) daily with breakfast by mouth. (Patient  not taking: Reported on 03/28/2017)  . metoprolol (LOPRESSOR) 50 MG tablet TAKE 1 TABLET TWICE DAILY  . NOVOLIN 70/30 (70-30) 100 UNIT/ML injection INJECT 40 UNITS INTO THE SKIN 2 TIMES A DAY BEFORE MEAL INJECT 40 UNITS BREAKFAST& 30 UNITS SUPPER  . Omega-3 Fatty Acids (FISH OIL) 1200 MG CAPS Take 1,200 mg daily by mouth.  . ONE TOUCH ULTRA TEST test strip USE TO TEST BLOOD SUGAR TWICE DAILY.  Marland Kitchen ONETOUCH DELICA LANCETS 54G MISC USE TO TEST BLOOD SUGAR TWICE DAILY.  . pravastatin (PRAVACHOL) 80 MG tablet Take 80 mg 3 (three) times a week by mouth.   . Propylene Glycol (SYSTANE BALANCE OP) Apply 1 drop as needed to eye (irritation).  . Rivaroxaban (XARELTO) 15 MG TABS tablet Take 15 mg 2 (two) times daily with a meal by mouth.  Manus Gunning BOWEL PREP KIT 17.5-3.13-1.6 GM/180ML SOLN Suprep-Use as directed   No facility-administered encounter medications on file as of 03/28/2017.    ALLERGIES: Allergies  Allergen Reactions  . Ace Inhibitors Swelling    REACTION: angio edema (swelling of lips)  . Metformin And Related     itching   VACCINATION STATUS: Immunization History  Administered Date(s) Administered  . Influenza Split 11/27/2011, 12/30/2012, 01/01/2014  . Influenza Whole 01/27/2009, 11/30/2009, 11/17/2010  . Influenza, High Dose Seasonal PF 12/21/2015  . Pneumococcal Polysaccharide-23 12/02/2008    Diabetes  He presents for his follow-up diabetic visit. He has type 2 diabetes mellitus. Onset time: Diagnosed approximately  at age 59. His disease course has been improving. Pertinent negatives for hypoglycemia include no confusion, headaches, pallor or seizures. Pertinent negatives for diabetes include no chest pain, no fatigue, no polydipsia, no polyphagia, no polyuria and no weakness. Symptoms are improving. Risk factors for coronary artery disease include diabetes mellitus, dyslipidemia, male sex, hypertension and sedentary lifestyle. Current diabetic treatment includes insulin  injections. His weight is fluctuating minimally. He is following a generally unhealthy diet. Meal planning includes ADA exchanges. He participates in exercise intermittently. His breakfast blood glucose range is generally 130-140 mg/dl. His lunch blood glucose range is generally 140-180 mg/dl. His dinner blood glucose range is generally 180-200 mg/dl. His overall blood glucose range is 140-180 mg/dl.  Hypertension  This is a chronic problem. The current episode started more than 1 year ago. The problem is controlled. Pertinent negatives include no chest pain, headaches, neck pain, palpitations or shortness of breath. Past treatments include calcium channel blockers.  Hyperlipidemia  This is a chronic problem. The current episode started more than 1 year ago. The problem is uncontrolled. Pertinent negatives include no chest pain, myalgias or shortness of breath. Current antihyperlipidemic treatment includes bile acid squestrants. Risk factors for coronary artery disease include dyslipidemia and diabetes mellitus.     Review of Systems  Constitutional: Negative for  fatigue and unexpected weight change.  HENT: Negative for dental problem, mouth sores and trouble swallowing.   Eyes: Negative for visual disturbance.  Respiratory: Negative for cough, choking, chest tightness, shortness of breath and wheezing.   Cardiovascular: Negative for chest pain, palpitations and leg swelling.  Gastrointestinal: Negative for abdominal distention, abdominal pain, constipation, diarrhea, nausea and vomiting.  Endocrine: Negative for polydipsia, polyphagia and polyuria.  Genitourinary: Negative for dysuria, flank pain, hematuria and urgency.  Musculoskeletal: Negative for back pain, gait problem, joint swelling, myalgias and neck pain.  Skin: Negative for pallor, rash and wound.  Neurological: Negative for seizures, syncope, weakness, numbness and headaches.  Psychiatric/Behavioral: Negative for confusion, dysphoric  mood, hallucinations and suicidal ideas.    Objective:    BP 133/72   Pulse (!) 57   Ht '6\' 1"'  (1.854 m)   Wt 229 lb (103.9 kg)   BMI 30.21 kg/m   Wt Readings from Last 3 Encounters:  03/28/17 229 lb (103.9 kg)  02/05/17 231 lb (104.8 kg)  12/26/16 231 lb (104.8 kg)    Physical Exam  Constitutional: He is oriented to person, place, and time. He appears well-developed and well-nourished. He is cooperative.  HENT:  Head: Normocephalic and atraumatic.  Eyes: EOM are normal.  Neck: Normal range of motion. Neck supple. No tracheal deviation present. No thyromegaly present.  Cardiovascular: Normal rate and normal heart sounds. Exam reveals no gallop.  No murmur heard. Pulses:      Carotid pulses are 2+ on the right side.      Radial pulses are 2+ on the right side.       Dorsalis pedis pulses are 2+ on the right side.       Posterior tibial pulses are 2+ on the right side.  Pulmonary/Chest: Breath sounds normal. No respiratory distress. He has no wheezes.  Abdominal: Soft. Bowel sounds are normal. He exhibits no distension. There is no hepatosplenomegaly. There is no tenderness. There is no guarding and no CVA tenderness.  Musculoskeletal: He exhibits no edema.       Right shoulder: He exhibits no swelling and no deformity.  Lymphadenopathy:       Head (right side): No submental and no submandibular adenopathy present.       Head (left side): No submental and no submandibular adenopathy present.       Right cervical: No superficial cervical adenopathy present.      Left cervical: No superficial cervical adenopathy present.       Right: No supraclavicular adenopathy present.       Left: No supraclavicular adenopathy present.  Neurological: He is alert and oriented to person, place, and time. He has normal strength and normal reflexes. No cranial nerve deficit or sensory deficit. Gait normal.  Skin: Skin is warm and dry. No rash noted. No cyanosis. Nails show no clubbing.   Psychiatric: He has a normal mood and affect. His speech is normal and behavior is normal. Judgment and thought content normal. Cognition and memory are normal.   Recent Results (from the past 2160 hour(s))  Glucose, capillary     Status: None   Collection Time: 02/05/17  8:44 AM  Result Value Ref Range   Glucose-Capillary 98 65 - 99 mg/dL  COMPLETE METABOLIC PANEL WITH GFR     Status: Abnormal   Collection Time: 03/19/17  7:22 AM  Result Value Ref Range   Glucose, Bld 172 (H) 65 - 99 mg/dL    Comment: For someone without known diabetes,  a glucose value >125 mg/dL indicates that they may have diabetes and this should be confirmed with a follow-up test. . .            Fasting reference interval .    BUN 14 7 - 25 mg/dL   Creat 1.29 (H) 0.70 - 1.18 mg/dL    Comment: For patients >30 years of age, the reference limit for Creatinine is approximately 13% higher for people identified as African-American. .    GFR, Est Non African American 54 (L) > OR = 60 mL/min/1.53m   GFR, Est African American 62 > OR = 60 mL/min/1.746m  BUN/Creatinine Ratio 11 6 - 22 (calc)   Sodium 139 135 - 146 mmol/L   Potassium 4.0 3.5 - 5.3 mmol/L   Chloride 103 98 - 110 mmol/L   CO2 31 20 - 32 mmol/L   Calcium 9.3 8.6 - 10.3 mg/dL   Total Protein 7.0 6.1 - 8.1 g/dL   Albumin 4.2 3.6 - 5.1 g/dL   Globulin 2.8 1.9 - 3.7 g/dL (calc)   AG Ratio 1.5 1.0 - 2.5 (calc)   Total Bilirubin 0.4 0.2 - 1.2 mg/dL   Alkaline phosphatase (APISO) 85 40 - 115 U/L   AST 23 10 - 35 U/L   ALT 21 9 - 46 U/L  Hemoglobin A1c     Status: Abnormal   Collection Time: 03/19/17  7:22 AM  Result Value Ref Range   Hgb A1c MFr Bld 7.3 (H) <5.7 % of total Hgb    Comment: For someone without known diabetes, a hemoglobin A1c value of 6.5% or greater indicates that they may have  diabetes and this should be confirmed with a follow-up  test. . For someone with known diabetes, a value <7% indicates  that their diabetes is well  controlled and a value  greater than or equal to 7% indicates suboptimal  control. A1c targets should be individualized based on  duration of diabetes, age, comorbid conditions, and  other considerations. . Currently, no consensus exists regarding use of hemoglobin A1c for diagnosis of diabetes for children. .    Mean Plasma Glucose 163 (calc)   eAG (mmol/L) 9.0 (calc)  VITAMIN D 25 Hydroxy (Vit-D Deficiency, Fractures)     Status: Abnormal   Collection Time: 03/19/17  7:22 AM  Result Value Ref Range   Vit D, 25-Hydroxy 12 (L) 30 - 100 ng/mL    Comment: Vitamin D Status         25-OH Vitamin D: . Deficiency:                    <20 ng/mL Insufficiency:             20 - 29 ng/mL Optimal:                 > or = 30 ng/mL . For 25-OH Vitamin D testing on patients on  D2-supplementation and patients for whom quantitation  of D2 and D3 fractions is required, the [facility]ureD(TM) 25-OH VIT D, (D2,D3), LC/MS/MS is recommended: order  code [id number] >2y61yr . For more information on this test, go to: http://education.quest[facility].com/faq/FAQ163 (This link is being provided for  informational/educational purposes only.)   I-STAT creatinine     Status: None   Collection Time: 03/20/17 10:16 AM  Result Value Ref Range   Creatinine, Ser 1.20 0.61 - 1.24 mg/dL   Lipid Panel     Component Value Date/Time   CHOL 165 03/17/2016  0714   TRIG 142 03/17/2016 0714   HDL 40 (L) 03/17/2016 0714   CHOLHDL 4.1 03/17/2016 0714   VLDL 28 03/17/2016 0714   LDLCALC 97 03/17/2016 0714   LDLDIRECT 147.5 06/10/2012 0930     Assessment & Plan:   1. Type 2 diabetes mellitus with stage 2 chronic kidney disease He came with improved  Fasting and  target suppertime blood glucose profile and his A1c is  better at 7.3% from 8.1% .  Patient is advised to stick to a routine mealtimes to eat 3 meals  a day and avoid unnecessary snacks ( to snack only to correct hypoglycemia).  -  Suggestion is  made for him to avoid simple carbohydrates  from his diet including Cakes, Sweet Desserts / Pastries, Ice Cream, Soda (diet and regular), Sweet Tea, Candies, Chips, Cookies, Store Bought Juices, Alcohol in Excess of  1-2 drinks a day, Artificial Sweeteners, and "Sugar-free" Products. This will help patient to have stable blood glucose profile and potentially avoid unintended weight gain.  I advised him to  continue his current insulin regimen,  Novolin 70/30  40 units with breakfast and  30 units with supper when pre-meal blood glucose is above 90.  - He is advised to continue glucose monitoring at least 2 times daily-before breakfast and at supper and anytime as needed. - Patient is instructed to call back with extremes of blood glucose readings  less than 70 or greater than 300 mg/dl. - His renal function is improving, however he did not tolerate full dose metformin in the past.  - He has tolerated metformin 500 mg ER once a day after supper, I have advised him to continue on the same.   2. Mixed hyperlipidemia - His repeat lipid panel showed improvement in his LDL to 95 from 1:15 and triglycerides at 142 from 250. - He is taking his pravastatin 10 mg 3 days a week, I advised him to continue on the same.  He is advised to continue with omega-3 fatty acids ,avoid butter and fried food and exercise regularly.  3. Essential hypertension  Blood pressure today  is controlled to 133/72 improving from  142/90. Patient is advised to continue amlodipine.  4) vitamin D deficiency - New diagnosis for him. I discussed and initiated vitamin D 3 5000 units daily for the next 90 days.   - Time spent with the patient: 25 min, of which >50% was spent in reviewing his blood glucose logs , discussing his hypo- and hyper-glycemic episodes, reviewing his current and  previous labs and insulin doses and developing a plan to avoid hypo- and hyper-glycemia. Please refer to Patient Instructions for Blood Glucose  Monitoring and Insulin/Medications Dosing Guide"  in media tab for additional information.  Follow up plan: Return in about 4 months (around 07/26/2017) for meter, and logs.  Glade Lloyd, MD Phone: [phone]  Fax: [phone]  -  This note was partially dictated with voice recognition software. Similar sounding words can be transcribed inadequately or may not  be corrected upon review.  03/28/2017, 2:25 PM

## 2017-04-06 ENCOUNTER — Other Ambulatory Visit (HOSPITAL_COMMUNITY): Payer: Self-pay | Admitting: Urology

## 2017-04-06 ENCOUNTER — Ambulatory Visit (HOSPITAL_COMMUNITY)
Admission: RE | Admit: 2017-04-06 | Discharge: 2017-04-06 | Disposition: A | Discharge status: Home / Self Care | Payer: PPO | Source: Ambulatory Visit | Attending: Urology | Admitting: Urology

## 2017-04-06 DIAGNOSIS — R3912 Poor urinary stream: Secondary | ICD-10-CM | POA: Diagnosis not present

## 2017-04-06 DIAGNOSIS — D49511 Neoplasm of unspecified behavior of right kidney: Secondary | ICD-10-CM

## 2017-04-06 DIAGNOSIS — C641 Malignant neoplasm of right kidney, except renal pelvis: Secondary | ICD-10-CM | POA: Diagnosis not present

## 2017-04-06 DIAGNOSIS — N401 Enlarged prostate with lower urinary tract symptoms: Secondary | ICD-10-CM | POA: Diagnosis not present

## 2017-04-19 ENCOUNTER — Other Ambulatory Visit: Payer: Self-pay | Admitting: "Endocrinology

## 2017-04-23 ENCOUNTER — Other Ambulatory Visit: Payer: Self-pay | Admitting: Urology

## 2017-04-25 DIAGNOSIS — Z6831 Body mass index (BMI) 31.0-31.9, adult: Secondary | ICD-10-CM | POA: Diagnosis not present

## 2017-04-25 DIAGNOSIS — E782 Mixed hyperlipidemia: Secondary | ICD-10-CM | POA: Diagnosis not present

## 2017-04-25 DIAGNOSIS — J329 Chronic sinusitis, unspecified: Secondary | ICD-10-CM | POA: Diagnosis not present

## 2017-04-25 DIAGNOSIS — Z7901 Long term (current) use of anticoagulants: Secondary | ICD-10-CM | POA: Diagnosis not present

## 2017-04-25 DIAGNOSIS — B351 Tinea unguium: Secondary | ICD-10-CM | POA: Diagnosis not present

## 2017-04-25 DIAGNOSIS — Z1389 Encounter for screening for other disorder: Secondary | ICD-10-CM | POA: Diagnosis not present

## 2017-04-25 DIAGNOSIS — L84 Corns and callosities: Secondary | ICD-10-CM | POA: Diagnosis not present

## 2017-04-25 DIAGNOSIS — B353 Tinea pedis: Secondary | ICD-10-CM | POA: Diagnosis not present

## 2017-04-25 DIAGNOSIS — E669 Obesity, unspecified: Secondary | ICD-10-CM | POA: Diagnosis not present

## 2017-04-25 DIAGNOSIS — I10 Essential (primary) hypertension: Secondary | ICD-10-CM | POA: Diagnosis not present

## 2017-05-07 NOTE — Patient Instructions (Addendum)
Kentrel Clevenger  05/07/2017   Your procedure is scheduled on: 05/21/2017   Report to [facility] Main  Entrance  Report to admitting at    0930 AM  Call this number if you have problems the morning of surgery [phone]   Remember: Do not eat food or drink liquids :After Midnight.          Magnesium Citrate- one bottle by 12 noon day before surgery.      Take these medicines the morning of surgery with A SIP OF WATER:   Allopurinol, Amlodipine ( NOrvasc), Flonase if needed, Gabapentin ( Neurontin), Metoprolol ( Lopressor )  DO NOT TAKE ANY DIABETIC MEDICATIONS DAY OF YOUR SURGERY                               You may not have any metal on your body including hair pins and              piercings  Do not wear jewelry,, lotions, powders or perfumes, deodorant                           Men may shave face and neck.   Do not bring valuables to the hospital. [place].  Contacts, dentures or bridgework may not be worn into surgery.  Leave suitcase in the car. After surgery it may be brought to your room.                       Please read over the following fact sheets you were given: _____________________________________________________________________             [facility] - Preparing for Surgery Before surgery, you can play an important role.  Because skin is not sterile, your skin needs to be as free of germs as possible.  You can reduce the number of germs on your skin by washing with CHG (chlorahexidine gluconate) soap before surgery.  CHG is an antiseptic cleaner which kills germs and bonds with the skin to continue killing germs even after washing. Please DO NOT use if you have an allergy to CHG or antibacterial soaps.  If your skin becomes reddened/irritated stop using the CHG and inform your nurse when you arrive at Short Stay. Do not shave (including legs and underarms) for at least 48 hours prior to  the first CHG shower.  You may shave your face/neck. Please follow these instructions carefully:  1.  Shower with CHG Soap the night before surgery and the  morning of Surgery.  2.  If you choose to wash your hair, wash your hair first as usual with your  normal  shampoo.  3.  After you shampoo, rinse your hair and body thoroughly to remove the  shampoo.                           4.  Use CHG as you would any other liquid soap.  You can apply chg directly  to the skin and wash                       Gently with a  scrungie or clean washcloth.  5.  Apply the CHG Soap to your body ONLY FROM THE NECK DOWN.   Do not use on face/ open                           Wound or open sores. Avoid contact with eyes, ears mouth and genitals (private parts).                       Wash face,  Genitals (private parts) with your normal soap.             6.  Wash thoroughly, paying special attention to the area where your surgery  will be performed.  7.  Thoroughly rinse your body with warm water from the neck down.  8.  DO NOT shower/wash with your normal soap after using and rinsing off  the CHG Soap.                9.  Pat yourself dry with a clean towel.            10.  Wear clean pajamas.            11.  Place clean sheets on your bed the night of your first shower and do not  sleep with pets. Day of Surgery : Do not apply any lotions/deodorants the morning of surgery.  Please wear clean clothes to the hospital/surgery center.  FAILURE TO FOLLOW THESE INSTRUCTIONS MAY RESULT IN THE CANCELLATION OF YOUR SURGERY PATIENT SIGNATURE_________________________________  NURSE SIGNATURE__________________________________  ________________________________________________________________________  WHAT IS A BLOOD TRANSFUSION? Blood Transfusion Information  A transfusion is the replacement of blood or some of its parts. Blood is made up of multiple cells which provide different functions.  Red blood cells carry oxygen and  are used for blood loss replacement.  White blood cells fight against infection.  Platelets control bleeding.  Plasma helps clot blood.  Other blood products are available for specialized needs, such as hemophilia or other clotting disorders. BEFORE THE TRANSFUSION  Who gives blood for transfusions?   Healthy volunteers who are fully evaluated to make sure their blood is safe. This is blood bank blood. Transfusion therapy is the safest it has ever been in the practice of medicine. Before blood is taken from a donor, a complete history is taken to make sure that person has no history of diseases nor engages in risky social behavior (examples are intravenous drug use or sexual activity with multiple partners). The donor's travel history is screened to minimize risk of transmitting infections, such as malaria. The donated blood is tested for signs of infectious diseases, such as HIV and hepatitis. The blood is then tested to be sure it is compatible with you in order to minimize the chance of a transfusion reaction. If you or a relative donates blood, this is often done in anticipation of surgery and is not appropriate for emergency situations. It takes many days to process the donated blood. RISKS AND COMPLICATIONS Although transfusion therapy is very safe and saves many lives, the main dangers of transfusion include:   Getting an infectious disease.  Developing a transfusion reaction. This is an allergic reaction to something in the blood you were given. Every precaution is taken to prevent this. The decision to have a blood transfusion has been considered carefully by your caregiver before blood is given. Blood is not given unless the benefits outweigh the risks. AFTER  THE TRANSFUSION  Right after receiving a blood transfusion, you will usually feel much better and more energetic. This is especially true if your red blood cells have gotten low (anemic). The transfusion raises the level of the  red blood cells which carry oxygen, and this usually causes an energy increase.  The nurse administering the transfusion will monitor you carefully for complications. HOME CARE INSTRUCTIONS  No special instructions are needed after a transfusion. You may find your energy is better. Speak with your caregiver about any limitations on activity for underlying diseases you may have. SEEK MEDICAL CARE IF:   Your condition is not improving after your transfusion.  You develop redness or irritation at the intravenous (IV) site. SEEK IMMEDIATE MEDICAL CARE IF:  Any of the following symptoms occur over the next 12 hours:  Shaking chills.  You have a temperature by mouth above 102 F (38.9 C), not controlled by medicine.  Chest, back, or muscle pain.  People around you feel you are not acting correctly or are confused.  Shortness of breath or difficulty breathing.  Dizziness and fainting.  You get a rash or develop hives.  You have a decrease in urine output.  Your urine turns a dark color or changes to pink, red, or brown. Any of the following symptoms occur over the next 10 days:  You have a temperature by mouth above 102 F (38.9 C), not controlled by medicine.  Shortness of breath.  Weakness after normal activity.  The white part of the eye turns yellow (jaundice).  You have a decrease in the amount of urine or are urinating less often.  Your urine turns a dark color or changes to pink, red, or brown. Document Released: 02/25/2000 Document Revised: 05/22/2011 Document Reviewed: 10/14/2007 ExitCare Patient Information 2014 [place].  _______________________________________________________________________  Incentive Spirometer  An incentive spirometer is a tool that can help keep your lungs clear and active. This tool measures how well you are filling your lungs with each breath. Taking long deep breaths may help reverse or decrease the chance of developing breathing  (pulmonary) problems (especially infection) following:  A long period of time when you are unable to move or be active. BEFORE THE PROCEDURE   If the spirometer includes an indicator to show your best effort, your nurse or respiratory therapist will set it to a desired goal.  If possible, sit up straight or lean slightly forward. Try not to slouch.  Hold the incentive spirometer in an upright position. INSTRUCTIONS FOR USE  1. Sit on the edge of your bed if possible, or sit up as far as you can in bed or on a chair. 2. Hold the incentive spirometer in an upright position. 3. Breathe out normally. 4. Place the mouthpiece in your mouth and seal your lips tightly around it. 5. Breathe in slowly and as deeply as possible, raising the piston or the ball toward the top of the column. 6. Hold your breath for 3-5 seconds or for as long as possible. Allow the piston or ball to fall to the bottom of the column. 7. Remove the mouthpiece from your mouth and breathe out normally. 8. Rest for a few seconds and repeat Steps 1 through 7 at least 10 times every 1-2 hours when you are awake. Take your time and take a few normal breaths between deep breaths. 9. The spirometer may include an indicator to show your best effort. Use the indicator as a goal to work toward during each  repetition. 10. After each set of 10 deep breaths, practice coughing to be sure your lungs are clear. If you have an incision (the cut made at the time of surgery), support your incision when coughing by placing a pillow or rolled up towels firmly against it. Once you are able to get out of bed, walk around indoors and cough well. You may stop using the incentive spirometer when instructed by your caregiver.  RISKS AND COMPLICATIONS  Take your time so you do not get dizzy or light-headed.  If you are in pain, you may need to take or ask for pain medication before doing incentive spirometry. It is harder to take a deep breath if you  are having pain. AFTER USE  Rest and breathe slowly and easily.  It can be helpful to keep track of a log of your progress. Your caregiver can provide you with a simple table to help with this. If you are using the spirometer at home, follow these instructions: [place] IF:   You are having difficultly using the spirometer.  You have trouble using the spirometer as often as instructed.  Your pain medication is not giving enough relief while using the spirometer.  You develop fever of 100.5 F (38.1 C) or higher. SEEK IMMEDIATE MEDICAL CARE IF:   You cough up bloody sputum that had not been present before.  You develop fever of 102 F (38.9 C) or greater.  You develop worsening pain at or near the incision site. MAKE SURE YOU:   Understand these instructions.  Will watch your condition.  Will get help right away if you are not doing well or get worse. Document Released: 07/10/2006 Document Revised: 05/22/2011 Document Reviewed: 09/10/2006 ExitCare Patient Information 2014 ExitCare, [place].   ________________________________________________________________________ How to Manage Your Diabetes Before and After Surgery  Why is it important to control my blood sugar before and after surgery? . Improving blood sugar levels before and after surgery helps healing and can limit problems. . A way of improving blood sugar control is eating a healthy diet by: o  Eating less sugar and carbohydrates o  Increasing activity/exercise o  Talking with your doctor about reaching your blood sugar goals . High blood sugars (greater than 180 mg/dL) can raise your risk of infections and slow your recovery, so you will need to focus on controlling your diabetes during the weeks before surgery. . Make sure that the doctor who takes care of your diabetes knows about your planned surgery including the date and location.  How do I manage my blood sugar before surgery? . Check your blood  sugar at least 4 times a day, starting 2 days before surgery, to make sure that the level is not too high or low. o Check your blood sugar the morning of your surgery when you wake up and every 2 hours until you get to the Short Stay unit. . If your blood sugar is less than 70 mg/dL, you will need to treat for low blood sugar: o Do not take insulin. o Treat a low blood sugar (less than 70 mg/dL) with  cup of clear juice (cranberry or apple), 4 glucose tablets, OR glucose gel. o Recheck blood sugar in 15 minutes after treatment (to make sure it is greater than 70 mg/dL). If your blood sugar is not greater than 70 mg/dL on recheck, call [phone] for further instructions. . Report your blood sugar to the short stay nurse when you get to Short Stay.  Marland Kitchen  If you are admitted to the hospital after surgery: o Your blood sugar will be checked by the staff and you will probably be given insulin after surgery (instead of oral diabetes medicines) to make sure you have good blood sugar levels. o The goal for blood sugar control after surgery is 80-180 mg/dL.   WHAT DO I DO ABOUT MY DIABETES MEDICATION?  Marland Kitchen Do not take oral diabetes medicines (pills) the morning of surgery. .   . THE NIGHT BEFORE SURGERY, take     units of       insulin.       . THE MORNING OF SURGERY, take   units of         insulin.  . The day of surgery, do not take other diabetes injectables, including Byetta (exenatide), Bydureon (exenatide ER), Victoza (liraglutide), or Trulicity (dulaglutide).  .   Patient Signature:  Date:   Nurse Signature:  Date:   Reviewed and Endorsed by [facility] Patient Education Committee, August 2015

## 2017-05-14 ENCOUNTER — Encounter (HOSPITAL_COMMUNITY): Payer: Self-pay

## 2017-05-14 ENCOUNTER — Encounter (HOSPITAL_COMMUNITY)
Admission: RE | Admit: 2017-05-14 | Discharge: 2017-05-14 | Disposition: A | Discharge status: Home / Self Care | Payer: PPO | Source: Ambulatory Visit | Attending: Urology | Admitting: Urology

## 2017-05-14 ENCOUNTER — Other Ambulatory Visit: Payer: Self-pay

## 2017-05-14 DIAGNOSIS — Z01818 Encounter for other preprocedural examination: Secondary | ICD-10-CM | POA: Insufficient documentation

## 2017-05-14 DIAGNOSIS — Z01812 Encounter for preprocedural laboratory examination: Secondary | ICD-10-CM | POA: Insufficient documentation

## 2017-05-14 DIAGNOSIS — Z0183 Encounter for blood typing: Secondary | ICD-10-CM | POA: Diagnosis not present

## 2017-05-14 DIAGNOSIS — D49511 Neoplasm of unspecified behavior of right kidney: Secondary | ICD-10-CM | POA: Diagnosis not present

## 2017-05-14 DIAGNOSIS — I440 Atrioventricular block, first degree: Secondary | ICD-10-CM | POA: Diagnosis not present

## 2017-05-14 DIAGNOSIS — R001 Bradycardia, unspecified: Secondary | ICD-10-CM | POA: Insufficient documentation

## 2017-05-14 LAB — CBC
HEMATOCRIT: 43.8 % (ref 39.0–52.0)
HEMOGLOBIN: 15.3 g/dL (ref 13.0–17.0)
MCH: 32.3 pg (ref 26.0–34.0)
MCHC: 34.9 g/dL (ref 30.0–36.0)
MCV: 92.6 fL (ref 78.0–100.0)
Platelets: 142 10*3/uL — ABNORMAL LOW (ref 150–400)
RBC: 4.73 MIL/uL (ref 4.22–5.81)
RDW: 13 % (ref 11.5–15.5)
WBC: 8 10*3/uL (ref 4.0–10.5)

## 2017-05-14 LAB — BASIC METABOLIC PANEL
ANION GAP: 7 (ref 5–15)
BUN: 14 mg/dL (ref 6–20)
CO2: 30 mmol/L (ref 22–32)
Calcium: 9.1 mg/dL (ref 8.9–10.3)
Chloride: 103 mmol/L (ref 101–111)
Creatinine, Ser: 1.23 mg/dL (ref 0.61–1.24)
GFR calc Af Amer: >60 mL/min (ref >60)
GFR, EST NON AFRICAN AMERICAN: 55 mL/min — AB (ref >60)
GLUCOSE: 144 mg/dL — AB (ref 65–99)
Potassium: 4.3 mmol/L (ref 3.5–5.1)
Sodium: 140 mmol/L (ref 135–145)

## 2017-05-14 LAB — HEMOGLOBIN A1C
HEMOGLOBIN A1C: 7.3 % — AB (ref 4.8–5.6)
MEAN PLASMA GLUCOSE: 162.81 mg/dL

## 2017-05-14 LAB — GLUCOSE, CAPILLARY: Glucose-Capillary: 210 mg/dL — ABNORMAL HIGH (ref 65–99)

## 2017-05-14 LAB — ABO/RH: ABO/RH(D): A POS

## 2017-05-14 NOTE — Progress Notes (Addendum)
CXR- 04/06/17-epic  Final EKG done 05/14/17-epic

## 2017-05-14 NOTE — Progress Notes (Signed)
Patient was instructed to notify Dr Laverta Baltimore ( endocrinologist) of pending surgery on 05/21/2017 and also Dr Hilma Favors ( PCP) .  Wife was with patient at preop appointment and they both voiced understanding.

## 2017-05-15 NOTE — Progress Notes (Signed)
Called patient at home and patient does have preop instructions regarding Xarelto.

## 2017-05-18 DIAGNOSIS — M545 Low back pain: Secondary | ICD-10-CM | POA: Diagnosis not present

## 2017-05-18 NOTE — H&P (Signed)
CC/HPI: 1. Complex bilateral renal masses.     He presents today to undergo a right laparoscopic radical nephrectomy.  The most concerning of the masses on the right kidney has been characterized as a Bosniak III renal cyst with the contralateral left lesion consistent with a Bosniak IIF lesion. He has denied any hematuria or new abdominal pain symptoms. His overall health has remained stable.   With regard to his voiding situation, he remains on tamsulosin 0.4 mg.     ALLERGIES: ACE Inhibitors MetFORMIN HCl TABS    MEDICATIONS: Allopurinol 300 mg tablet  Aspirin  Metoprolol Tartrate 100 mg tablet  Omeprazole 20 mg tablet, delayed release  Tamsulosin Hcl 0.4 mg capsule  Amlodipine Besylate 10 mg tablet  Aspirin Ec 81 mg tablet, delayed release  Diclofenac Sodium 75 mg tablet, delayed release  Fish Oil  Flonase Allergy Relief 50 mcg/actuation spray, suspension  Gabapentin 300 mg capsule  Glimepiride 2 mg tablet  Glucosamine  Januvia 100 mg tablet  Multivitamin  Novolin 70-30 100 unit/ml (70-30) vial  Pravastatin Sodium  Xarelto 20 mg tablet     GU PSH: Cysto Bladder Ureth Biopsy - 2013 Locm 300-399Mg /Ml Iodine,1Ml - 11/02/2015 Remove Prostate Regrowth - 2013      PSH Notes: Eye Surgery, Cystoscopy With Biopsy, Transuret Resect Of Regrow Obstr Tis Longer Than 1 Yr Postop, Prostate Surgery   NON-GU PSH: None   GU PMH: Weak Urinary Stream - 03/03/2016 Right renal neoplasm - 10/26/2015 BPH w/LUTS, Benign prostatic hyperplasia (BPH) with straining on urination - 02/24/2015 Encounter for Prostate Cancer screening, Prostate cancer screening - 02/24/2015 Gross hematuria, Gross hematuria - 2014 Hydrocele, Unspec, Hydrocele, left - 2014 Renal calculus, Nephrolithiasis - 2014      PMH Notes:   1) BPH/LUTS: He is s/p an open simple prostatectomy in 2002 by Dr. Rosana Hoes.   2) Prostate cancer screening:   Family history: None   3) Hematuria: He developed gross hematuria in  January 2013 and underwent a full urologic evaluation including CT imaging and cystoscopy which were unremarkable. However, he had a positive urine cytology. Further evaluation revealed an atypical cytology and negative FISH. He denies a history of tobacco use or family history of GU malignancy.   Feb 2013: Biopsies of bladder and prostatic urethra benign, RPGs normal  May 2013: Cysto negative, Cytology atypical, FISH negative   4) Right renal neoplasm: He was incidentally found to have a 3.7 cm Bosniak III cystic lesion of the right kidney in August 2017. After discussing options for management, he elected initial surveillance.   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 He is taking Xarelto now.     NON-GU PMH: Gout, Gout - 2014 Diabetes Type 2 GERD Hypercholesterolemia Hypertension Phlebitis and thrombophlebitis of unspecified deep vessels of unspecified lower extremity    FAMILY HISTORY: Cancer - Father, Mother Diabetes - Mother Hypertension - Mother   SOCIAL HISTORY: Marital Status: Married Preferred Language: English; Ethnicity: Not Hispanic Or Latino; Race: Black or African American Current Smoking Status: Patient has never smoked.  Social Drinker.      Notes: Never A Smoker, Alcohol Use, Tobacco Use   REVIEW OF SYSTEMS:    GU Review Male:   Patient reports trouble starting your streams. Patient denies frequent urination, hard to postpone urination, burning/ pain with urination, get up at night to urinate, leakage of urine, stream starts and stops, and have to strain to urinate .  Gastrointestinal (Lower):   Patient  denies diarrhea and constipation.  Gastrointestinal (Upper):   Patient denies nausea and vomiting.  Constitutional:   Patient denies fever, night sweats, weight loss, and fatigue.  Skin:   Patient reports itching. Patient denies skin rash/ lesion.  Eyes:   Patient denies blurred vision and double vision.  Ears/ Nose/  Throat:   Patient denies sore throat and sinus problems.  Hematologic/Lymphatic:   Patient denies swollen glands and easy bruising.  Cardiovascular:   Patient denies leg swelling and chest pains.  Respiratory:   Patient denies shortness of breath and cough.  Endocrine:   Patient denies excessive thirst.  Musculoskeletal:   Patient reports joint pain. Patient denies back pain.  Neurological:   Patient denies headaches and dizziness.  Psychologic:   Patient denies depression and anxiety.   VITAL SIGNS:     Weight 228 lb / 103.42 kg  Height 73 in / 185.42 cm  BMI 30.1 kg/m   MULTI-SYSTEM PHYSICAL EXAMINATION:    Constitutional: Well-nourished. No physical deformities. Normally developed. Good grooming.  Neck: Neck symmetrical, not swollen. Normal tracheal position.  Respiratory: No labored breathing, no use of accessory muscles. Clear bilaterally.  Cardiovascular: Normal temperature, normal extremity pulses, no swelling, no varicosities. Regular rate and rhythm.  Lymphatic: No enlargement of neck, axillae, groin.  Skin: No paleness, no jaundice, no cyanosis. No lesion, no ulcer, no rash.  Neurologic / Psychiatric: Oriented to time, oriented to place, oriented to person. No depression, no anxiety, no agitation.  Gastrointestinal: No mass, no tenderness, no rigidity, non obese abdomen. He has a well-healed lower midline incision from his prior open simple prostatectomy many years ago.  Eyes: Normal conjunctivae. Normal eyelids.  Ears, Nose, Mouth, and Throat: Left ear no scars, no lesions, no masses. Right ear no scars, no lesions, no masses. Nose no scars, no lesions, no masses. Normal hearing. Normal lips.  Musculoskeletal: Normal gait and station of head and neck.       ASSESSMENT:      ICD-10 Details  1 GU:   Right renal neoplasm - D49.511     PLAN:         1. Bosniak III renal cyst: He will proceed with a right laparoscopic radical nephrectomy. I discussed the potential  benefits and risks of the procedure, side effects of the proposed treatment, the likelihood of the patient achieving the goals of the procedure, and any potential problems that might occur during the procedure or recuperation. He gives informed consent to proceed.

## 2017-05-20 NOTE — Anesthesia Preprocedure Evaluation (Addendum)
Anesthesia Evaluation  Patient identified by MRN, date of birth, ID band Patient awake    Reviewed: Allergy & Precautions, NPO status , Patient's Chart, lab work & pertinent test results  History of Anesthesia Complications (+) DIFFICULT AIRWAY and history of anesthetic complications  Airway Mallampati: II  TM Distance: >3 FB Neck ROM: Limited    Dental  (+) Teeth Intact, Chipped, Caps, Dental Advisory Given   Pulmonary asthma ,    breath sounds clear to auscultation       Cardiovascular hypertension, Pt. on medications and Pt. on home beta blockers + Peripheral Vascular Disease   Rhythm:Regular Rate:Normal  EKG 3/19 Sinus bradycardia with 1st degree A-V block Otherwise normal ECG Since last tracing rate slower   Neuro/Psych    GI/Hepatic GERD  ,  Endo/Other  diabetes, Insulin Dependent  Renal/GU CRFRenal disease     Musculoskeletal  (+) Arthritis , Osteoarthritis,    Abdominal   Peds  Hematology   Anesthesia Other Findings Limited ROM especially with extension may have contributed to difficult intubation in the past.  PT has undergone uneventful GA since then.    Reproductive/Obstetrics                            Anesthesia Physical  Anesthesia Plan  ASA: III  Anesthesia Plan:    Post-op Pain Management:    Induction: Intravenous  PONV Risk Score and Plan: 1 and Treatment may vary due to age or medical condition and Ondansetron  Airway Management Planned: Oral ETT and Video Laryngoscope Planned  Additional Equipment:   Intra-op Plan:   Post-operative Plan:   Informed Consent: I have reviewed the patients History and Physical, chart, labs and discussed the procedure including the risks, benefits and alternatives for the proposed anesthesia with the patient or authorized representative who has indicated his/her understanding and acceptance.     Plan Discussed with: CRNA  and Anesthesiologist  Anesthesia Plan Comments:        Anesthesia Quick Evaluation

## 2017-05-21 ENCOUNTER — Inpatient Hospital Stay (HOSPITAL_COMMUNITY)
Admission: RE | Admit: 2017-05-21 | Discharge: 2017-05-24 | DRG: 658 | Disposition: A | Discharge status: Home / Self Care | Payer: PPO | Source: Ambulatory Visit | Attending: Urology | Admitting: Urology

## 2017-05-21 ENCOUNTER — Other Ambulatory Visit: Payer: Self-pay

## 2017-05-21 ENCOUNTER — Inpatient Hospital Stay (HOSPITAL_COMMUNITY): Payer: PPO | Admitting: Anesthesiology

## 2017-05-21 ENCOUNTER — Encounter (HOSPITAL_COMMUNITY): Payer: Self-pay | Admitting: *Deleted

## 2017-05-21 ENCOUNTER — Encounter (HOSPITAL_COMMUNITY)
Admission: RE | Disposition: A | Discharge status: Home / Self Care | Payer: Self-pay | Source: Ambulatory Visit | Attending: Urology

## 2017-05-21 DIAGNOSIS — Z7982 Long term (current) use of aspirin: Secondary | ICD-10-CM

## 2017-05-21 DIAGNOSIS — C641 Malignant neoplasm of right kidney, except renal pelvis: Secondary | ICD-10-CM | POA: Diagnosis not present

## 2017-05-21 DIAGNOSIS — Z794 Long term (current) use of insulin: Secondary | ICD-10-CM | POA: Diagnosis not present

## 2017-05-21 DIAGNOSIS — D49511 Neoplasm of unspecified behavior of right kidney: Secondary | ICD-10-CM | POA: Diagnosis not present

## 2017-05-21 DIAGNOSIS — Z809 Family history of malignant neoplasm, unspecified: Secondary | ICD-10-CM

## 2017-05-21 DIAGNOSIS — Z79899 Other long term (current) drug therapy: Secondary | ICD-10-CM | POA: Diagnosis not present

## 2017-05-21 DIAGNOSIS — Z7901 Long term (current) use of anticoagulants: Secondary | ICD-10-CM

## 2017-05-21 DIAGNOSIS — Z833 Family history of diabetes mellitus: Secondary | ICD-10-CM | POA: Diagnosis not present

## 2017-05-21 DIAGNOSIS — M109 Gout, unspecified: Secondary | ICD-10-CM | POA: Diagnosis present

## 2017-05-21 DIAGNOSIS — Z86718 Personal history of other venous thrombosis and embolism: Secondary | ICD-10-CM

## 2017-05-21 DIAGNOSIS — Z8249 Family history of ischemic heart disease and other diseases of the circulatory system: Secondary | ICD-10-CM

## 2017-05-21 DIAGNOSIS — E1151 Type 2 diabetes mellitus with diabetic peripheral angiopathy without gangrene: Secondary | ICD-10-CM | POA: Diagnosis not present

## 2017-05-21 DIAGNOSIS — K219 Gastro-esophageal reflux disease without esophagitis: Secondary | ICD-10-CM | POA: Diagnosis present

## 2017-05-21 DIAGNOSIS — I10 Essential (primary) hypertension: Secondary | ICD-10-CM | POA: Diagnosis present

## 2017-05-21 DIAGNOSIS — E1122 Type 2 diabetes mellitus with diabetic chronic kidney disease: Secondary | ICD-10-CM | POA: Diagnosis not present

## 2017-05-21 DIAGNOSIS — N182 Chronic kidney disease, stage 2 (mild): Secondary | ICD-10-CM | POA: Diagnosis not present

## 2017-05-21 DIAGNOSIS — M1991 Primary osteoarthritis, unspecified site: Secondary | ICD-10-CM | POA: Diagnosis present

## 2017-05-21 DIAGNOSIS — I129 Hypertensive chronic kidney disease with stage 1 through stage 4 chronic kidney disease, or unspecified chronic kidney disease: Secondary | ICD-10-CM | POA: Diagnosis not present

## 2017-05-21 HISTORY — PX: LAPAROSCOPIC NEPHRECTOMY: SHX1930

## 2017-05-21 LAB — BASIC METABOLIC PANEL
Anion gap: 9 (ref 5–15)
BUN: 14 mg/dL (ref 6–20)
CHLORIDE: 104 mmol/L (ref 101–111)
CO2: 26 mmol/L (ref 22–32)
CREATININE: 1.3 mg/dL — AB (ref 0.61–1.24)
Calcium: 8.4 mg/dL — ABNORMAL LOW (ref 8.9–10.3)
GFR calc Af Amer: 60 mL/min — ABNORMAL LOW (ref >60)
GFR calc non Af Amer: 52 mL/min — ABNORMAL LOW (ref >60)
Glucose, Bld: 93 mg/dL (ref 65–99)
Potassium: 3.7 mmol/L (ref 3.5–5.1)
SODIUM: 139 mmol/L (ref 135–145)

## 2017-05-21 LAB — GLUCOSE, CAPILLARY
Glucose-Capillary: 91 mg/dL (ref 65–99)
Glucose-Capillary: 93 mg/dL (ref 65–99)
Glucose-Capillary: 108 mg/dL — ABNORMAL HIGH (ref 65–99)

## 2017-05-21 LAB — HEMOGLOBIN AND HEMATOCRIT, BLOOD
HCT: 42.8 % (ref 39.0–52.0)
HEMOGLOBIN: 14.7 g/dL (ref 13.0–17.0)

## 2017-05-21 LAB — TYPE AND SCREEN
ABO/RH(D): A POS
ANTIBODY SCREEN: NEGATIVE

## 2017-05-21 SURGERY — NEPHRECTOMY, RADICAL, LAPAROSCOPIC, ADULT
Anesthesia: General | Laterality: Right

## 2017-05-21 MED ORDER — ROCURONIUM BROMIDE 10 MG/ML (PF) SYRINGE
PREFILLED_SYRINGE | INTRAVENOUS | Status: AC
  Filled 2017-05-21: qty 5

## 2017-05-21 MED ORDER — PHENYLEPHRINE HCL 10 MG/ML IJ SOLN
INTRAMUSCULAR | Status: AC
  Filled 2017-05-21: qty 1

## 2017-05-21 MED ORDER — DEXTROSE 5 % IV SOLN
INTRAVENOUS | Status: DC | PRN
  Administered 2017-05-21: 25 ug/min via INTRAVENOUS

## 2017-05-21 MED ORDER — HYDROMORPHONE HCL 1 MG/ML IJ SOLN
INTRAMUSCULAR | Status: DC | PRN
  Administered 2017-05-21 (×2): 0.5 mg via INTRAVENOUS

## 2017-05-21 MED ORDER — ONDANSETRON HCL 4 MG/2ML IJ SOLN
INTRAMUSCULAR | Status: AC
  Filled 2017-05-21: qty 2

## 2017-05-21 MED ORDER — BUPIVACAINE-EPINEPHRINE (PF) 0.5% -1:200000 IJ SOLN
INTRAMUSCULAR | Status: DC | PRN
  Administered 2017-05-21: 4 mL

## 2017-05-21 MED ORDER — METOPROLOL TARTRATE 50 MG PO TABS
50.0000 mg | ORAL_TABLET | Freq: Two times a day (BID) | ORAL | Status: DC
  Administered 2017-05-21 – 2017-05-23 (×5): 50 mg via ORAL
  Filled 2017-05-21 (×5): qty 1

## 2017-05-21 MED ORDER — LABETALOL HCL 5 MG/ML IV SOLN
INTRAVENOUS | Status: AC
  Filled 2017-05-21: qty 4

## 2017-05-21 MED ORDER — HYDROMORPHONE HCL 1 MG/ML IJ SOLN
0.5000 mg | INTRAMUSCULAR | Status: DC | PRN
  Administered 2017-05-22: 0.5 mg via INTRAVENOUS
  Filled 2017-05-21: qty 1

## 2017-05-21 MED ORDER — DOCUSATE SODIUM 100 MG PO CAPS
100.0000 mg | ORAL_CAPSULE | Freq: Two times a day (BID) | ORAL | Status: DC
  Administered 2017-05-21 – 2017-05-23 (×5): 100 mg via ORAL
  Filled 2017-05-21 (×5): qty 1

## 2017-05-21 MED ORDER — HYDROCODONE-ACETAMINOPHEN 5-325 MG PO TABS: 1.0000 | ORAL_TABLET | Freq: Four times a day (QID) | ORAL | 0 refills | Status: DC | PRN

## 2017-05-21 MED ORDER — CEFAZOLIN SODIUM-DEXTROSE 1-4 GM/50ML-% IV SOLN
1.0000 g | Freq: Three times a day (TID) | INTRAVENOUS | Status: AC
  Administered 2017-05-21 – 2017-05-22 (×2): 1 g via INTRAVENOUS
  Filled 2017-05-21 (×2): qty 50

## 2017-05-21 MED ORDER — LIDOCAINE HCL (CARDIAC) 20 MG/ML IV SOLN
INTRAVENOUS | Status: DC | PRN
  Administered 2017-05-21: 25 mg via INTRAVENOUS
  Administered 2017-05-21: 75 mg via INTRAVENOUS

## 2017-05-21 MED ORDER — DIPHENHYDRAMINE HCL 50 MG/ML IJ SOLN: 12.5000 mg | Freq: Four times a day (QID) | INTRAMUSCULAR | Status: DC | PRN

## 2017-05-21 MED ORDER — FLUTICASONE PROPIONATE 50 MCG/ACT NA SUSP: 2.0000 | Freq: Every day | NASAL | Status: DC | PRN

## 2017-05-21 MED ORDER — AMLODIPINE BESYLATE 10 MG PO TABS
10.0000 mg | ORAL_TABLET | Freq: Every morning | ORAL | Status: DC
  Administered 2017-05-22 – 2017-05-23 (×2): 10 mg via ORAL
  Filled 2017-05-21 (×2): qty 1

## 2017-05-21 MED ORDER — EPHEDRINE 5 MG/ML INJ
INTRAVENOUS | Status: AC
  Filled 2017-05-21: qty 10

## 2017-05-21 MED ORDER — LIDOCAINE 2% (20 MG/ML) 5 ML SYRINGE
INTRAMUSCULAR | Status: AC
  Filled 2017-05-21: qty 5

## 2017-05-21 MED ORDER — PROPOFOL 10 MG/ML IV BOLUS
INTRAVENOUS | Status: AC
  Filled 2017-05-21: qty 20

## 2017-05-21 MED ORDER — SUCCINYLCHOLINE CHLORIDE 200 MG/10ML IV SOSY
PREFILLED_SYRINGE | INTRAVENOUS | Status: AC
  Filled 2017-05-21: qty 10

## 2017-05-21 MED ORDER — MIDAZOLAM HCL 5 MG/5ML IJ SOLN
INTRAMUSCULAR | Status: DC | PRN
  Administered 2017-05-21: 1 mg via INTRAVENOUS

## 2017-05-21 MED ORDER — MAGNESIUM CITRATE PO SOLN
1.0000 | Freq: Once | ORAL | Status: AC
  Administered 2017-05-21: 1 via ORAL
  Filled 2017-05-21: qty 296

## 2017-05-21 MED ORDER — DIPHENHYDRAMINE HCL 12.5 MG/5ML PO ELIX: 12.5000 mg | ORAL_SOLUTION | Freq: Four times a day (QID) | ORAL | Status: DC | PRN

## 2017-05-21 MED ORDER — PROPOFOL 10 MG/ML IV BOLUS
INTRAVENOUS | Status: DC | PRN
  Administered 2017-05-21: 40 mg via INTRAVENOUS
  Administered 2017-05-21: 50 mg via INTRAVENOUS
  Administered 2017-05-21: 150 mg via INTRAVENOUS

## 2017-05-21 MED ORDER — INSULIN ASPART 100 UNIT/ML ~~LOC~~ SOLN
0.0000 [IU] | SUBCUTANEOUS | Status: DC
  Administered 2017-05-21 – 2017-05-22 (×2): 4 [IU] via SUBCUTANEOUS
  Administered 2017-05-22: 3 [IU] via SUBCUTANEOUS
  Administered 2017-05-22: 4 [IU] via SUBCUTANEOUS
  Administered 2017-05-22: 3 [IU] via SUBCUTANEOUS
  Administered 2017-05-23: 4 [IU] via SUBCUTANEOUS
  Administered 2017-05-23 (×3): 3 [IU] via SUBCUTANEOUS
  Administered 2017-05-23: 4 [IU] via SUBCUTANEOUS
  Administered 2017-05-24: 3 [IU] via SUBCUTANEOUS

## 2017-05-21 MED ORDER — LACTATED RINGERS IR SOLN
Status: DC | PRN
  Administered 2017-05-21: 1000 mL

## 2017-05-21 MED ORDER — BUPIVACAINE-EPINEPHRINE (PF) 0.5% -1:200000 IJ SOLN
INTRAMUSCULAR | Status: AC
  Filled 2017-05-21: qty 30

## 2017-05-21 MED ORDER — ONDANSETRON HCL 4 MG/2ML IJ SOLN
INTRAMUSCULAR | Status: DC | PRN
  Administered 2017-05-21: 4 mg via INTRAVENOUS

## 2017-05-21 MED ORDER — SODIUM CHLORIDE 0.9 % IJ SOLN
INTRAMUSCULAR | Status: DC | PRN
  Administered 2017-05-21: 20 mL

## 2017-05-21 MED ORDER — MEPERIDINE HCL 50 MG/ML IJ SOLN: 6.2500 mg | INTRAMUSCULAR | Status: DC | PRN

## 2017-05-21 MED ORDER — CEFAZOLIN SODIUM-DEXTROSE 2-4 GM/100ML-% IV SOLN
2.0000 g | Freq: Once | INTRAVENOUS | Status: AC
  Administered 2017-05-21: 2 g via INTRAVENOUS
  Filled 2017-05-21: qty 100

## 2017-05-21 MED ORDER — LACTATED RINGERS IV SOLN
INTRAVENOUS | Status: DC
  Administered 2017-05-21 (×3): via INTRAVENOUS

## 2017-05-21 MED ORDER — ENOXAPARIN SODIUM 40 MG/0.4ML ~~LOC~~ SOLN
40.0000 mg | SUBCUTANEOUS | Status: DC
  Administered 2017-05-22 – 2017-05-23 (×2): 40 mg via SUBCUTANEOUS
  Filled 2017-05-21 (×2): qty 0.4

## 2017-05-21 MED ORDER — SODIUM CHLORIDE 0.9 % IJ SOLN
INTRAMUSCULAR | Status: AC
  Filled 2017-05-21: qty 20

## 2017-05-21 MED ORDER — MIDAZOLAM HCL 2 MG/2ML IJ SOLN
INTRAMUSCULAR | Status: AC
  Filled 2017-05-21: qty 2

## 2017-05-21 MED ORDER — ALLOPURINOL 300 MG PO TABS
300.0000 mg | ORAL_TABLET | Freq: Every day | ORAL | Status: DC
  Administered 2017-05-22 – 2017-05-23 (×2): 300 mg via ORAL
  Filled 2017-05-21 (×2): qty 1

## 2017-05-21 MED ORDER — PHENYLEPHRINE 40 MCG/ML (10ML) SYRINGE FOR IV PUSH (FOR BLOOD PRESSURE SUPPORT)
PREFILLED_SYRINGE | INTRAVENOUS | Status: AC
  Filled 2017-05-21: qty 10

## 2017-05-21 MED ORDER — LABETALOL HCL 5 MG/ML IV SOLN
INTRAVENOUS | Status: DC | PRN
  Administered 2017-05-21: 5 mg via INTRAVENOUS

## 2017-05-21 MED ORDER — SUFENTANIL CITRATE 50 MCG/ML IV SOLN
INTRAVENOUS | Status: DC | PRN
  Administered 2017-05-21 (×5): 5 ug via INTRAVENOUS

## 2017-05-21 MED ORDER — ROCURONIUM BROMIDE 100 MG/10ML IV SOLN
INTRAVENOUS | Status: DC | PRN
  Administered 2017-05-21: 70 mg via INTRAVENOUS
  Administered 2017-05-21: 10 mg via INTRAVENOUS

## 2017-05-21 MED ORDER — FENTANYL CITRATE (PF) 100 MCG/2ML IJ SOLN: 25.0000 ug | INTRAMUSCULAR | Status: DC | PRN

## 2017-05-21 MED ORDER — PHENYLEPHRINE HCL 10 MG/ML IJ SOLN
INTRAMUSCULAR | Status: DC | PRN
Start: 2017-05-21 — End: 2017-05-21
  Administered 2017-05-21 (×2): 120 ug via INTRAVENOUS
  Administered 2017-05-21: 80 ug via INTRAVENOUS
  Administered 2017-05-21: 120 ug via INTRAVENOUS

## 2017-05-21 MED ORDER — EPHEDRINE SULFATE 50 MG/ML IJ SOLN
INTRAMUSCULAR | Status: DC | PRN
  Administered 2017-05-21: 10 mg via INTRAVENOUS

## 2017-05-21 MED ORDER — PRAVASTATIN SODIUM 40 MG PO TABS
80.0000 mg | ORAL_TABLET | ORAL | Status: DC
  Administered 2017-05-21: 80 mg via ORAL
  Filled 2017-05-21: qty 2

## 2017-05-21 MED ORDER — GLYCOPYRROLATE 0.2 MG/ML IJ SOLN
INTRAMUSCULAR | Status: DC | PRN
  Administered 2017-05-21: 0.2 mg via INTRAVENOUS

## 2017-05-21 MED ORDER — BUPIVACAINE LIPOSOME 1.3 % IJ SUSP
20.0000 mL | Freq: Once | INTRAMUSCULAR | Status: AC
  Administered 2017-05-21: 20 mL
  Filled 2017-05-21: qty 20

## 2017-05-21 MED ORDER — HYDROMORPHONE HCL 2 MG/ML IJ SOLN
INTRAMUSCULAR | Status: AC
  Filled 2017-05-21: qty 1

## 2017-05-21 MED ORDER — SUGAMMADEX SODIUM 200 MG/2ML IV SOLN
INTRAVENOUS | Status: AC
  Filled 2017-05-21: qty 2

## 2017-05-21 MED ORDER — DEXAMETHASONE SODIUM PHOSPHATE 10 MG/ML IJ SOLN
INTRAMUSCULAR | Status: AC
  Filled 2017-05-21: qty 1

## 2017-05-21 MED ORDER — SODIUM CHLORIDE 0.9 % IV SOLN
INTRAVENOUS | Status: DC
  Administered 2017-05-21 – 2017-05-23 (×5): via INTRAVENOUS

## 2017-05-21 MED ORDER — GABAPENTIN 300 MG PO CAPS
600.0000 mg | ORAL_CAPSULE | Freq: Every day | ORAL | Status: DC
  Administered 2017-05-21 – 2017-05-23 (×3): 600 mg via ORAL
  Filled 2017-05-21 (×3): qty 2

## 2017-05-21 MED ORDER — SUGAMMADEX SODIUM 200 MG/2ML IV SOLN
INTRAVENOUS | Status: DC | PRN
  Administered 2017-05-21: 200 mg via INTRAVENOUS

## 2017-05-21 MED ORDER — HEPARIN SODIUM (PORCINE) 5000 UNIT/ML IJ SOLN
5000.0000 [IU] | Freq: Once | INTRAMUSCULAR | Status: AC
  Administered 2017-05-21: 5000 [IU] via SUBCUTANEOUS
  Filled 2017-05-21: qty 1

## 2017-05-21 MED ORDER — GABAPENTIN 400 MG PO CAPS
1200.0000 mg | ORAL_CAPSULE | Freq: Every day | ORAL | Status: DC
  Administered 2017-05-22 – 2017-05-23 (×2): 1200 mg via ORAL
  Filled 2017-05-21 (×2): qty 3

## 2017-05-21 MED ORDER — SUFENTANIL CITRATE 50 MCG/ML IV SOLN
INTRAVENOUS | Status: AC
  Filled 2017-05-21: qty 1

## 2017-05-21 MED ORDER — ONDANSETRON HCL 4 MG/2ML IJ SOLN: 4.0000 mg | INTRAMUSCULAR | Status: DC | PRN

## 2017-05-21 MED ORDER — SODIUM CHLORIDE 0.9 % IJ SOLN
INTRAMUSCULAR | Status: AC
  Filled 2017-05-21: qty 10

## 2017-05-21 MED ORDER — SUCCINYLCHOLINE CHLORIDE 20 MG/ML IJ SOLN
INTRAMUSCULAR | Status: DC | PRN
  Administered 2017-05-21: 140 mg via INTRAVENOUS

## 2017-05-21 SURGICAL SUPPLY — 47 items
ADH SKN CLS APL DERMABOND .7 (GAUZE/BANDAGES/DRESSINGS) ×1
AGENT HMST KT MTR STRL THRMB (HEMOSTASIS)
BAG LAPAROSCOPIC 12 15 PORT 16 (BASKET) ×1 IMPLANT
BAG RETRIEVAL 12/15 (BASKET) ×2
BAG RETRIEVAL 12/15MM (BASKET) ×1
BAG SPEC THK2 15X12 ZIP CLS (MISCELLANEOUS) ×1
BAG ZIPLOCK 12X15 (MISCELLANEOUS) ×3 IMPLANT
BLADE EXTENDED COATED 6.5IN (ELECTRODE) IMPLANT
BLADE SURG SZ10 CARB STEEL (BLADE) IMPLANT
CHLORAPREP W/TINT 26ML (MISCELLANEOUS) ×3 IMPLANT
CLIP VESOLOCK LG 6/CT PURPLE (CLIP) ×3 IMPLANT
CLIP VESOLOCK MED LG 6/CT (CLIP) ×3 IMPLANT
CLIP VESOLOCK XL 6/CT (CLIP) IMPLANT
COVER SURGICAL LIGHT HANDLE (MISCELLANEOUS) ×3 IMPLANT
CUTTER FLEX LINEAR 45M (STAPLE) ×2 IMPLANT
DERMABOND ADVANCED (GAUZE/BANDAGES/DRESSINGS) ×2
DERMABOND ADVANCED .7 DNX12 (GAUZE/BANDAGES/DRESSINGS) ×1 IMPLANT
DRAPE INCISE IOBAN 66X45 STRL (DRAPES) ×3 IMPLANT
DRAPE WARM FLUID 44X44 (DRAPE) IMPLANT
ELECT PENCIL ROCKER SW 15FT (MISCELLANEOUS) ×3 IMPLANT
ELECT REM PT RETURN 15FT ADLT (MISCELLANEOUS) ×3 IMPLANT
GLOVE BIO SURGEON STRL SZ 6.5 (GLOVE) ×2 IMPLANT
GLOVE BIO SURGEONS STRL SZ 6.5 (GLOVE) ×1
GLOVE BIOGEL M STRL SZ7.5 (GLOVE) ×3 IMPLANT
GOWN STRL REUS W/TWL LRG LVL3 (GOWN DISPOSABLE) ×6 IMPLANT
HEMOSTAT SURGICEL 4X8 (HEMOSTASIS) ×2 IMPLANT
IRRIG SUCT STRYKERFLOW 2 WTIP (MISCELLANEOUS) ×3
IRRIGATION SUCT STRKRFLW 2 WTP (MISCELLANEOUS) ×1 IMPLANT
KIT BASIN OR (CUSTOM PROCEDURE TRAY) ×3 IMPLANT
RELOAD 45 VASCULAR/THIN (ENDOMECHANICALS) ×3 IMPLANT
RELOAD STAPLE 45 2.5 WHT GRN (ENDOMECHANICALS) IMPLANT
SCISSORS LAP 5X35 DISP (ENDOMECHANICALS) IMPLANT
SHEARS HARMONIC ACE PLUS 36CM (ENDOMECHANICALS) ×3 IMPLANT
SURGIFLO W/THROMBIN 8M KIT (HEMOSTASIS) IMPLANT
SUT ETHILON 3 0 PS 1 (SUTURE) IMPLANT
SUT MNCRL AB 4-0 PS2 18 (SUTURE) ×6 IMPLANT
SUT PDS AB 1 CTX 36 (SUTURE) ×6 IMPLANT
SUT VIC AB 2-0 SH 27 (SUTURE)
SUT VIC AB 2-0 SH 27X BRD (SUTURE) IMPLANT
SUT VICRYL 0 UR6 27IN ABS (SUTURE) ×3 IMPLANT
TOWEL OR 17X26 10 PK STRL BLUE (TOWEL DISPOSABLE) ×6 IMPLANT
TRAY LAPAROSCOPIC (CUSTOM PROCEDURE TRAY) ×3 IMPLANT
TROCAR BLADELESS OPT 5 100 (ENDOMECHANICALS) ×3 IMPLANT
TROCAR XCEL 12X100 BLDLESS (ENDOMECHANICALS) ×3 IMPLANT
TROCAR XCEL BLUNT TIP 100MML (ENDOMECHANICALS) ×3 IMPLANT
TUBING INSUF HEATED (TUBING) ×1 IMPLANT
YANKAUER SUCT BULB TIP 10FT TU (MISCELLANEOUS) ×3 IMPLANT

## 2017-05-21 NOTE — Transfer of Care (Signed)
Immediate Anesthesia Transfer of Care Note  Patient: Hector Rose  Procedure(s) Performed: LAPAROSCOPIC RADICAL NEPHRECTOMY (Right )  Patient Location: PACU  Anesthesia Type:General  Level of Consciousness: awake, alert , oriented and patient cooperative  Airway & Oxygen Therapy: Patient Spontanous Breathing and Patient connected to face mask oxygen  Post-op Assessment: Report given to RN, Post -op Vital signs reviewed and stable and Patient moving all extremities X 4  Post vital signs: stable  Last Vitals:  Vitals:   05/21/17 1000 05/21/17 1430  BP: (!) 148/79 (!) (P) 146/93  Pulse: 60   Resp: 18 (P) 13  Temp: 36.9 C (P) 36.8 C    Last Pain:  Vitals:   05/21/17 1000  TempSrc: Oral         Complications: No apparent anesthesia complications

## 2017-05-21 NOTE — Anesthesia Procedure Notes (Signed)
Procedure Name: Intubation Date/Time: 05/21/2017 11:54 AM Performed by: Lissa Morales, CRNA Pre-anesthesia Checklist: Patient identified, Emergency Drugs available, Suction available and Patient being monitored Patient Re-evaluated:Patient Re-evaluated prior to induction Oxygen Delivery Method: Circle system utilized Preoxygenation: Pre-oxygenation with 100% oxygen Induction Type: IV induction Ventilation: Mask ventilation without difficulty Laryngoscope Size: Mac and Glidescope Tube type: Oral Number of attempts: 1 Airway Equipment and Method: Stylet and Oral airway Placement Confirmation: ETT inserted through vocal cords under direct vision,  positive ETCO2 and breath sounds checked- equal and bilateral Secured at: 22 cm Tube secured with: Tape Dental Injury: Teeth and Oropharynx as per pre-operative assessment  Difficulty Due To: Difficulty was anticipated, Difficult Airway- due to large tongue, Difficult Airway- due to reduced neck mobility, Difficult Airway- due to anterior larynx, Difficult Airway- due to limited oral opening and Difficult Airway- due to dentition Comments: Caps on right front upper teeth, intact after intubation.Elective glidescope, with issues as above. Small oral opening. Good visualization with glidescope, Unable to find previous record with difficult airway.

## 2017-05-21 NOTE — Progress Notes (Signed)
Patient ID: Hector Rose, male   DOB: 19-Mar-1940, 77 y.o.   MRN: [id number]  Post-op note  Subjective: The patient is doing well.  No complaints.  Objective: Vital signs in last 24 hours: Temp:  [97.6 F (36.4 C)-98.5 F (36.9 C)] 97.6 F (36.4 C) (03/11 1530) Pulse Rate:  [60-92] 74 (03/11 1530) Resp:  [12-18] 12 (03/11 1515) BP: (144-156)/(75-93) 151/75 (03/11 1530) SpO2:  [98 %-100 %] 99 % (03/11 1530) Weight:  [102.1 kg (225 lb)] 102.1 kg (225 lb) (03/11 1000)  Intake/Output from previous day: No intake/output data recorded. Intake/Output this shift: Total I/O In: 2775 [I.V.:2775] Out: 975 [Urine:925; Blood:50]  Physical Exam:  General: Alert and oriented. Abdomen: Soft, Nondistended. Incisions: Clean and dry.  Lab Results: Recent Labs    05/21/17 1439  HGB 14.7  HCT 42.8    Assessment/Plan: POD#0   1) Continue to monitor, ambulate, IS   Hector Rose. MD   LOS: 0 days   Hector Rose,LES 05/21/2017, 5:50 PM

## 2017-05-21 NOTE — Op Note (Signed)
Preoperative diagnosis: Right renal neoplasm  Postoperative diagnosis: Right renal neoplasm  Procedure: 1.  Right laparoscopic radical nephrectomy  Surgeon: [name]. M.D.  Assistant(s): Debbrah Alar, PA-C  Anesthesia: General  Complications: None  EBL: 50 mL  IVF:  2300 mL crystalloid  Specimens: 1. Right kidney  Disposition of specimens: Pathology  Indication: Hector Rose is a 77 y.o. patient with a right renal tumor suspicious for malignancy.  After a thorough review of the management options for their renal mass, they elected to proceed with surgical treatment and the above procedure.  We have discussed the potential benefits and risks of the procedure, side effects of the proposed treatment, the likelihood of the patient achieving the goals of the procedure, and any potential problems that might occur during the procedure or recuperation. Informed consent has been obtained.  Description of procedure:  The patient was taken to the operating room and a general anesthetic was administered. The patient was given preoperative antibiotics, placed in the right modified flank position, and prepped and draped in the usual sterile fashion. Next a preoperative timeout was performed.  A site was selected near the umbilicus for placement of the camera port. This was placed using a standard open Hassan technique which allowed entry into the peritoneal cavity under direct vision and without difficulty. A 12 mm Hassan cannula was placed and a pneumoperitoneum established. The camera was then used to inspect the abdomen and there was no evidence of any intra-abdominal injuries or other abnormalities. The remaining abdominal ports were then placed. A 12 mm port was placed in the right lower quadrant and a 5 mm port was placed in the right upper quadrant.  All ports were placed under direct vision without difficulty.  Utilizing the harmonic scalpel, the white line of Toldt was incised  allowing the colon to be mobilized medially and the plane between the mesocolon and the anterior layer of Gerota's fascia to be developed and the kidney exposed.  The ureter and gonadal vein were identified inferiorly and the ureter was lifted anteriorly off the psoas muscle.  Dissection proceeded superiorly along the gonadal vein until the renal vein was identified.  The renal hilum was then carefully isolated with a combination of blunt and sharp dissectiong allowing the renal arterial and venous structures to be separated and isolated.  There was single renal artery and multiple smaller renal veins (4 in all).  The smaller renal veins then isolated and also ligated and divided with multiple 15 mm Weck clips.The main renal artery and renal vein were then isolated and stapled en bloc with a 45 mm Flex ETS stapler with a vascular staple load.  Gerota's fascia was intentionally entered superiorly and the space between the adrenal gland and the kidney was developed allowing the adrenal gland to be spared.  The hepatorenal ligaments were divided with the harmonic scalpel.  The lateral and posterior attachements to the kidney were then divided.  The ureter was ligated with Weck clips and divided allowing the specimen to be freed from all surrounding structures. Hemostasis was ensured and a 4 x 8 cm piece of Surgicel was placed over the adrenal bed.  The kidney specimen was then placed into a 12 mm retrieval bag.  The renal hilum, liver, adrenal bed and gonadal vein areas were each inspected and hemostasis was ensured with the pneomperitoneal pressures lowered.  The 12 mm lower quadrant port was then closed with a 0-vicryl suture placed laparoscopically to close the fascia of  this incision. All remaining ports were removed under direct vision.  The kidney specimen was removed intact within the retrieval bag via the camera port site after this incision was extended slightly. This fascial opening was then closed with  two #1 PDS sutures.    All incisions were injected with local anesthetic and reapproximated at the skin with 4-0 monocryl sutures.  Dermabond was applied to the skin. The patient tolerated the procedure well and without complications and was transferred to the recovery unit in satisfactory condition.   [name] MD

## 2017-05-21 NOTE — Discharge Instructions (Signed)

## 2017-05-22 ENCOUNTER — Encounter (HOSPITAL_COMMUNITY): Payer: Self-pay | Admitting: Urology

## 2017-05-22 LAB — GLUCOSE, CAPILLARY
Glucose-Capillary: 103 mg/dL — ABNORMAL HIGH (ref 65–99)
Glucose-Capillary: 105 mg/dL — ABNORMAL HIGH (ref 65–99)
Glucose-Capillary: 121 mg/dL — ABNORMAL HIGH (ref 65–99)
Glucose-Capillary: 127 mg/dL — ABNORMAL HIGH (ref 65–99)
Glucose-Capillary: 153 mg/dL — ABNORMAL HIGH (ref 65–99)
Glucose-Capillary: 170 mg/dL — ABNORMAL HIGH (ref 65–99)
Glucose-Capillary: 195 mg/dL — ABNORMAL HIGH (ref 65–99)

## 2017-05-22 LAB — BASIC METABOLIC PANEL WITH GFR
Anion gap: 9 (ref 5–15)
BUN: 18 mg/dL (ref 6–20)
CO2: 28 mmol/L (ref 22–32)
Calcium: 8.8 mg/dL — ABNORMAL LOW (ref 8.9–10.3)
Chloride: 102 mmol/L (ref 101–111)
Creatinine, Ser: 1.8 mg/dL — ABNORMAL HIGH (ref 0.61–1.24)
GFR calc Af Amer: 40 mL/min — ABNORMAL LOW
GFR calc non Af Amer: 35 mL/min — ABNORMAL LOW
Glucose, Bld: 101 mg/dL — ABNORMAL HIGH (ref 65–99)
Potassium: 4.2 mmol/L (ref 3.5–5.1)
Sodium: 139 mmol/L (ref 135–145)

## 2017-05-22 LAB — HEMOGLOBIN AND HEMATOCRIT, BLOOD
HCT: 43.7 % (ref 39.0–52.0)
HEMOGLOBIN: 15.5 g/dL (ref 13.0–17.0)

## 2017-05-22 MED ORDER — BISACODYL 10 MG RE SUPP
10.0000 mg | Freq: Once | RECTAL | Status: AC
  Administered 2017-05-22: 10 mg via RECTAL
  Filled 2017-05-22: qty 1

## 2017-05-22 MED ORDER — HYDROCODONE-ACETAMINOPHEN 5-325 MG PO TABS
1.0000 | ORAL_TABLET | Freq: Four times a day (QID) | ORAL | Status: DC | PRN
  Administered 2017-05-22 – 2017-05-23 (×4): 1 via ORAL
  Filled 2017-05-22 (×4): qty 1

## 2017-05-22 NOTE — Progress Notes (Signed)
Patient ID: Hector Rose, male   DOB: 11-22-1940, 77 y.o.   MRN: [id number]  1 Day Post-Op Subjective: Doing well.  No complaints.  Pain controlled.  Ambulating well.  Objective: Vital signs in last 24 hours: Temp:  [97.6 F (36.4 C)-99 F (37.2 C)] 99 F (37.2 C) (03/12 0426) Pulse Rate:  [60-92] 69 (03/12 0426) Resp:  [12-20] 20 (03/12 0426) BP: (142-164)/(68-93) 142/68 (03/12 0426) SpO2:  [97 %-100 %] 97 % (03/12 0426) Weight:  [102.1 kg (225 lb)] 102.1 kg (225 lb) (03/11 1000)  Intake/Output from previous day: 03/11 0701 - 03/12 0700 In: 4202.5 [P.O.:240; I.V.:3962.5] Out: 4275 [Urine:4225; Blood:50] Intake/Output this shift: No intake/output data recorded.  Physical Exam:  General: Alert and oriented CV: RRR Lungs: Clear Abdomen: Soft, ND Incisions: C/D/I Ext: NT, No erythema  Lab Results: Recent Labs    05/21/17 1439 05/22/17 0514  HGB 14.7 15.5  HCT 42.8 43.7   BMET Recent Labs    05/21/17 1439 05/22/17 0514  NA 139 139  K 3.7 4.2  CL 104 102  CO2 26 28  GLUCOSE 93 101*  BUN 14 18  CREATININE 1.30* 1.80*  CALCIUM 8.4* 8.8*     Studies/Results: No results found.  Assessment/Plan: POD # 1 s/p right Lap nephrectomy - Decrease IVF - D/C Foley - Dulcoloax suppository - Ambulate, IS - Lovenox for DVT prophylaxis - Advance diet - Monitor renal function   LOS: 1 day   Denice Cardon,LES 05/22/2017, 8:00 AM

## 2017-05-23 LAB — BASIC METABOLIC PANEL
Anion gap: 7 (ref 5–15)
Anion gap: 8 (ref 5–15)
BUN: 23 mg/dL — AB (ref 6–20)
BUN: 25 mg/dL — AB (ref 6–20)
CHLORIDE: 105 mmol/L (ref 101–111)
CO2: 25 mmol/L (ref 22–32)
CO2: 26 mmol/L (ref 22–32)
CREATININE: 2.18 mg/dL — AB (ref 0.61–1.24)
Calcium: 8.4 mg/dL — ABNORMAL LOW (ref 8.9–10.3)
Calcium: 8.6 mg/dL — ABNORMAL LOW (ref 8.9–10.3)
Chloride: 105 mmol/L (ref 101–111)
Creatinine, Ser: 2.18 mg/dL — ABNORMAL HIGH (ref 0.61–1.24)
GFR calc Af Amer: 32 mL/min — ABNORMAL LOW (ref >60)
GFR calc Af Amer: 32 mL/min — ABNORMAL LOW (ref >60)
GFR calc non Af Amer: 28 mL/min — ABNORMAL LOW (ref >60)
GFR, EST NON AFRICAN AMERICAN: 28 mL/min — AB (ref >60)
GLUCOSE: 115 mg/dL — AB (ref 65–99)
Glucose, Bld: 94 mg/dL (ref 65–99)
POTASSIUM: 4 mmol/L (ref 3.5–5.1)
POTASSIUM: 4.3 mmol/L (ref 3.5–5.1)
Sodium: 137 mmol/L (ref 135–145)
Sodium: 139 mmol/L (ref 135–145)

## 2017-05-23 LAB — GLUCOSE, CAPILLARY
GLUCOSE-CAPILLARY: 140 mg/dL — AB (ref 65–99)
Glucose-Capillary: 111 mg/dL — ABNORMAL HIGH (ref 65–99)
Glucose-Capillary: 128 mg/dL — ABNORMAL HIGH (ref 65–99)
Glucose-Capillary: 141 mg/dL — ABNORMAL HIGH (ref 65–99)
Glucose-Capillary: 158 mg/dL — ABNORMAL HIGH (ref 65–99)
Glucose-Capillary: 97 mg/dL (ref 65–99)

## 2017-05-23 MED ORDER — BISACODYL 10 MG RE SUPP
10.0000 mg | Freq: Once | RECTAL | Status: AC
  Administered 2017-05-23: 10 mg via RECTAL
  Filled 2017-05-23: qty 1

## 2017-05-23 NOTE — Progress Notes (Signed)
Patient ID: Hector Rose, male   DOB: [birth year], 77 y.o.   MRN: [id number]  2 Days Post-Op Subjective: No complaints.  No flatus.  No vomiting or nausea.  No BM.  Ambulating well.    Objective: Vital signs in last 24 hours: Temp:  [98.4 F (36.9 C)-100 F (37.8 C)] 99.4 F (37.4 C) (03/13 0629) Pulse Rate:  [70-83] 83 (03/13 0629) Resp:  [20] 20 (03/13 0629) BP: (128-145)/(65-76) 141/76 (03/13 0629) SpO2:  [97 %-100 %] 97 % (03/13 0629)  Intake/Output from previous day: 03/12 0701 - 03/13 0700 In: 2605 [P.O.:180; I.V.:2425] Out: 250 [Urine:250] Intake/Output this shift: No intake/output data recorded.  Physical Exam:  General: Alert and oriented CV: RRR Lungs: Clear Abdomen: Soft, ND, minimal BS Incisions: C/D/I Ext: NT, No erythema  Lab Results: Recent Labs    05/21/17 1439 05/22/17 0514  HGB 14.7 15.5  HCT 42.8 43.7   BMET Recent Labs    05/22/17 0514 05/23/17 0528  NA 139 137  K 4.2 4.0  CL 102 105  CO2 28 25  GLUCOSE 101* 115*  BUN 18 23*  CREATININE 1.80* 2.18*  CALCIUM 8.8* 8.4*     Studies/Results: No results found.  Assessment/Plan: POD # 2 s/p right lap radical nephrectomy - Renal function slightly worse, UOP not well recorded yesterday ?? - Will recheck Cr later today and have reinforced need for strict I and Os, Continue IVF for now - Continue to await bowel function, ambulate, DVT prophylaxis - Path pending   LOS: 2 days   Latrel Szymczak,LES 05/23/2017, 8:38 AM

## 2017-05-24 ENCOUNTER — Encounter (HOSPITAL_COMMUNITY): Payer: Self-pay | Admitting: Urology

## 2017-05-24 LAB — GLUCOSE, CAPILLARY
GLUCOSE-CAPILLARY: 133 mg/dL — AB (ref 65–99)
Glucose-Capillary: 171 mg/dL — ABNORMAL HIGH (ref 65–99)
Glucose-Capillary: 106 mg/dL — ABNORMAL HIGH (ref 65–99)

## 2017-05-24 NOTE — Discharge Summary (Signed)
Date of admission: 05/21/2017  Date of discharge: 05/24/2017  Admission diagnosis: Right renal neoplasm  Discharge diagnosis: Renal cell carcinoma  Secondary diagnoses: Gout, hypertension, History of DVT, DM  History and Physical: For full details, please see admission history and physical. Briefly, Hector Rose is a 77 y.o. year old patient with an enlarging central right renal neoplasm.   Hospital Course: He underwent a right laparoscopic radical nephrectomy on 05/21/17.  His surgery was without complications.  He remained hemodynamically stable.  Over the next few days, he was able to begin ambulating without difficulty, have his diet advanced, and his renal function stabilized (Cr 2.18).  He had return of bowel function on 05/24/17 and was able to be discharged home in good condition.    Laboratory values:  Recent Labs    05/21/17 1439 05/22/17 0514  HGB 14.7 15.5  HCT 42.8 43.7   Recent Labs    05/23/17 0528 05/23/17 1344  CREATININE 2.18* 2.18*    Disposition: Home  Discharge instruction: The patient was instructed to be ambulatory but told to refrain from heavy lifting, strenuous activity, or driving.   Discharge medications:  Allergies as of 05/24/2017      Reactions   Ace Inhibitors Swelling   REACTION: angio edema (swelling of lips)   Metformin And Related    Itching in higher doses       Medication List    STOP taking these medications   aspirin EC 81 MG tablet   Fish Oil 1200 MG Caps   Glucosamine HCl 1000 MG Tabs   Vitamin D3 5000 units Caps     TAKE these medications   allopurinol 300 MG tablet Commonly known as:  ZYLOPRIM TAKE 1 TABLET BY MOUTH ONCE DAILY   amLODipine 10 MG tablet Commonly known as:  NORVASC TAKE 1 TABLET EVERY MORNING   BD VEO INSULIN SYRINGE U/F 31G X 15/64" 1 ML Misc Generic drug:  Insulin Syringe-Needle U-100 USE TWICE A DAY AS DIRECTED   clobetasol 0.05 % external solution Commonly known as:  TEMOVATE Apply 1  application 2 (two) times a week topically. To scalp   diphenhydrAMINE 25 MG tablet Commonly known as:  BENADRYL Take 25 mg by mouth daily as needed for allergies.   fluticasone 0.05 % cream Commonly known as:  CUTIVATE Apply 1 application 2 (two) times a week topically. To face for itching and flaking   fluticasone 50 MCG/ACT nasal spray Commonly known as:  FLONASE Place 2 sprays daily as needed into both nostrils for allergies or rhinitis.   gabapentin 600 MG tablet Commonly known as:  NEURONTIN Take 600-1,200 mg by mouth See admin instructions. Take 1200 mg in the morning and 600 mg at night   HYDROcodone-acetaminophen 5-325 MG tablet Commonly known as:  NORCO Take 1-2 tablets by mouth every 6 (six) hours as needed for moderate pain or severe pain.   metFORMIN 500 MG (MOD) 24 hr tablet Commonly known as:  GLUMETZA Take 500 mg by mouth daily with breakfast.   metoprolol tartrate 50 MG tablet Commonly known as:  LOPRESSOR TAKE 1 TABLET TWICE DAILY   NOVOLIN 70/30 (70-30) 100 UNIT/ML injection Generic drug:  insulin NPH-regular Human INJECT 40 UNITS INTO THE SKIN 2 TIMES A DAY BEFORE MEAL INJECT 40 UNITS BREAKFAST& 30 UNITS SUPPER What changed:  See the new instructions.   ONE TOUCH ULTRA TEST test strip Generic drug:  glucose blood USE TO TEST BLOOD SUGAR TWICE DAILY.   ONETOUCH DELICA LANCETS 42P  Misc USE TO TEST BLOOD SUGAR TWICE DAILY.   pravastatin 80 MG tablet Commonly known as:  PRAVACHOL Take 80 mg by mouth 2 (two) times a week.   Rivaroxaban 15 MG Tabs tablet Commonly known as:  XARELTO Take 15 mg by mouth daily.   SYSTANE BALANCE OP Apply 1 drop as needed to eye (irritation).       Followup:  Follow-up Information    Raynelle Bring, MD Follow up on 06/15/2017.   Specialty:  Urology Why:  at 8:30 Contact information: [employer] [place] [address] [phone]

## 2017-05-24 NOTE — Anesthesia Postprocedure Evaluation (Signed)
Anesthesia Post Note  Patient: Hector Rose  Procedure(s) Performed: LAPAROSCOPIC RADICAL NEPHRECTOMY (Right )     Patient location during evaluation: PACU Anesthesia Type: General Level of consciousness: awake and alert Pain management: pain level controlled Vital Signs Assessment: post-procedure vital signs reviewed and stable Respiratory status: spontaneous breathing, nonlabored ventilation, respiratory function stable and patient connected to nasal cannula oxygen Cardiovascular status: blood pressure returned to baseline and stable Postop Assessment: no apparent nausea or vomiting Anesthetic complications: no    Last Vitals:  Vitals:   05/23/17 2004 05/24/17 0441  BP: 132/70 (!) 145/76  Pulse: 76 70  Resp: 16 16  Temp: 36.9 C 37.4 C  SpO2: 99% 100%    Last Pain:  Vitals:   05/24/17 0441  TempSrc: Oral  PainSc:                  [place]

## 2017-05-24 NOTE — Progress Notes (Signed)
Patient ID: Hector Rose, male   DOB: [birth year], 77 y.o.   MRN: [id number]  3 Days Post-Op Subjective: Pt doing better.  Now passing flatus.  Minimal pain. Tolerating regular diet.  Objective: Vital signs in last 24 hours: Temp:  [98.3 F (36.8 C)-99.4 F (37.4 C)] 99.4 F (37.4 C) (03/14 0441) Pulse Rate:  [70-76] 70 (03/14 0441) Resp:  [16] 16 (03/14 0441) BP: (132-145)/(70-85) 145/76 (03/14 0441) SpO2:  [99 %-100 %] 100 % (03/14 0441)  Intake/Output from previous day: 03/13 0701 - 03/14 0700 In: 720 [P.O.:120; I.V.:600] Out: 1900 [Urine:1900] Intake/Output this shift: No intake/output data recorded.  Physical Exam:  General: Alert and oriented CV: RRR Lungs: Clear Abdomen: Soft, ND Incisions: C/D/I Ext: NT, No erythema  Lab Results: Recent Labs    05/21/17 1439 05/22/17 0514  HGB 14.7 15.5  HCT 42.8 43.7   BMET Recent Labs    05/23/17 0528 05/23/17 1344  NA 137 139  K 4.0 4.3  CL 105 105  CO2 25 26  GLUCOSE 115* 94  BUN 23* 25*  CREATININE 2.18* 2.18*  CALCIUM 8.4* 8.6*     Studies/Results: No results found.  Assessment/Plan: POD # 3 s/p right lap nephrectomy - D/C home today   LOS: 3 days   Lauralie Blacksher,LES 05/24/2017, 7:20 AM

## 2017-06-15 DIAGNOSIS — N401 Enlarged prostate with lower urinary tract symptoms: Secondary | ICD-10-CM | POA: Diagnosis not present

## 2017-06-15 DIAGNOSIS — R3912 Poor urinary stream: Secondary | ICD-10-CM | POA: Diagnosis not present

## 2017-06-15 DIAGNOSIS — C641 Malignant neoplasm of right kidney, except renal pelvis: Secondary | ICD-10-CM | POA: Diagnosis not present

## 2017-07-04 ENCOUNTER — Other Ambulatory Visit: Payer: Self-pay | Admitting: "Endocrinology

## 2017-07-13 DIAGNOSIS — Z1389 Encounter for screening for other disorder: Secondary | ICD-10-CM | POA: Diagnosis not present

## 2017-07-13 DIAGNOSIS — I82501 Chronic embolism and thrombosis of unspecified deep veins of right lower extremity: Secondary | ICD-10-CM | POA: Diagnosis not present

## 2017-07-13 DIAGNOSIS — G64 Other disorders of peripheral nervous system: Secondary | ICD-10-CM | POA: Diagnosis not present

## 2017-07-13 DIAGNOSIS — C679 Malignant neoplasm of bladder, unspecified: Secondary | ICD-10-CM | POA: Diagnosis not present

## 2017-07-13 DIAGNOSIS — Z6831 Body mass index (BMI) 31.0-31.9, adult: Secondary | ICD-10-CM | POA: Diagnosis not present

## 2017-07-13 DIAGNOSIS — E6609 Other obesity due to excess calories: Secondary | ICD-10-CM | POA: Diagnosis not present

## 2017-07-17 ENCOUNTER — Other Ambulatory Visit: Payer: Self-pay | Admitting: "Endocrinology

## 2017-07-17 DIAGNOSIS — M7022 Olecranon bursitis, left elbow: Secondary | ICD-10-CM | POA: Diagnosis not present

## 2017-07-17 DIAGNOSIS — E6609 Other obesity due to excess calories: Secondary | ICD-10-CM | POA: Diagnosis not present

## 2017-07-19 DIAGNOSIS — E1122 Type 2 diabetes mellitus with diabetic chronic kidney disease: Secondary | ICD-10-CM | POA: Diagnosis not present

## 2017-07-19 DIAGNOSIS — N182 Chronic kidney disease, stage 2 (mild): Secondary | ICD-10-CM | POA: Diagnosis not present

## 2017-07-19 DIAGNOSIS — Z794 Long term (current) use of insulin: Secondary | ICD-10-CM | POA: Diagnosis not present

## 2017-07-20 LAB — COMPLETE METABOLIC PANEL WITH GFR
AG Ratio: 1.6 (calc) (ref 1.0–2.5)
ALBUMIN MSPROF: 4.1 g/dL (ref 3.6–5.1)
ALKALINE PHOSPHATASE (APISO): 73 U/L (ref 40–115)
ALT: 18 U/L (ref 9–46)
AST: 23 U/L (ref 10–35)
BILIRUBIN TOTAL: 0.6 mg/dL (ref 0.2–1.2)
BUN / CREAT RATIO: 9 (calc) (ref 6–22)
BUN: 18 mg/dL (ref 7–25)
CHLORIDE: 105 mmol/L (ref 98–110)
CO2: 31 mmol/L (ref 20–32)
Calcium: 9.5 mg/dL (ref 8.6–10.3)
Creat: 1.99 mg/dL — ABNORMAL HIGH (ref 0.70–1.18)
GFR, Est African American: 37 mL/min/{1.73_m2} — ABNORMAL LOW (ref >=60)
GFR, Est Non African American: 32 mL/min/{1.73_m2} — ABNORMAL LOW (ref >=60)
GLOBULIN: 2.6 g/dL (ref 1.9–3.7)
GLUCOSE: 147 mg/dL — AB (ref 65–139)
Potassium: 4.2 mmol/L (ref 3.5–5.3)
SODIUM: 141 mmol/L (ref 135–146)
Total Protein: 6.7 g/dL (ref 6.1–8.1)

## 2017-07-20 LAB — HEMOGLOBIN A1C
Hgb A1c MFr Bld: 7.4 % of total Hgb — ABNORMAL HIGH (ref <5.7)
Mean Plasma Glucose: 166 (calc)
eAG (mmol/L): 9.2 (calc)

## 2017-07-23 ENCOUNTER — Ambulatory Visit: Payer: PPO | Admitting: Pulmonary Disease

## 2017-07-26 ENCOUNTER — Encounter: Payer: Self-pay | Admitting: "Endocrinology

## 2017-07-26 ENCOUNTER — Ambulatory Visit (INDEPENDENT_AMBULATORY_CARE_PROVIDER_SITE_OTHER): Payer: PPO | Admitting: "Endocrinology

## 2017-07-26 VITALS — BP 127/66 | HR 67 | Ht 73.0 in | Wt 229.0 lb

## 2017-07-26 DIAGNOSIS — I10 Essential (primary) hypertension: Secondary | ICD-10-CM | POA: Diagnosis not present

## 2017-07-26 DIAGNOSIS — E782 Mixed hyperlipidemia: Secondary | ICD-10-CM

## 2017-07-26 DIAGNOSIS — N183 Chronic kidney disease, stage 3 unspecified: Secondary | ICD-10-CM

## 2017-07-26 DIAGNOSIS — Z794 Long term (current) use of insulin: Secondary | ICD-10-CM | POA: Diagnosis not present

## 2017-07-26 DIAGNOSIS — E1122 Type 2 diabetes mellitus with diabetic chronic kidney disease: Secondary | ICD-10-CM

## 2017-07-26 NOTE — Progress Notes (Signed)
Subjective:    Patient ID: Hector Rose, male    DOB: December 13, 1940,    Past Medical History:  Diagnosis Date  . Asthma 04-11-11   AS CHILD ONLY  . BPH (benign prostatic hypertrophy) with urinary obstruction   . Carpal tunnel syndrome    patient denies at 05/14/17 preop visit   . Difficult intubation 04-11-11   08-11-97 some issues with intubation/record with chart  . DJD (degenerative joint disease)   . DM (diabetes mellitus) ([place]) 04-11-11   tyep II   . DVT femoral (deep venous thrombosis) with thrombophlebitis ([place]) 04-11-11   ?'[id number]. on Xarelton x 3-4 years   . GERD (gastroesophageal reflux disease) 04-11-11   mild, no med in 1 month  . Gout 04-11-11    ankle 2 yrs ago  . Hemorrhoids   . Hypercholesteremia   . Hypertension   . Internal hemorrhoids   . Keloid 04-11-11   multiple-arms,back, chest  . Pulmonary nodule 04-11-11   "PT. NOT AWARE" -denies problems with breathing  . Renal calculus   . Thyroid disease    hypothyroid   Past Surgical History:  Procedure Laterality Date  . cataract surgery  12/2013  . COLONOSCOPY WITH PROPOFOL N/A 02/05/2017   Procedure: COLONOSCOPY WITH PROPOFOL;  Surgeon: Ladene Artist, MD;  Location: [facility];  Service: Endoscopy;  Laterality: N/A;  . COSMETIC SURGERY  2000   Keloid injections  . CYSTOSCOPY WITH BIOPSY  04/14/2011   Procedure: CYSTOSCOPY WITH BIOPSY;  Surgeon: Dutch Gray, MD;  Location: [facility];  Service: Urology;  Laterality: N/A;  Bladder Biopsies   . CYSTOSCOPY/RETROGRADE/URETEROSCOPY  04/14/2011   Procedure: CYSTOSCOPY/RETROGRADE/URETEROSCOPY;  Surgeon: Dutch Gray, MD;  Location: [facility];  Service: Urology;  Laterality: Bilateral;  ([place]il) RPG   . LAPAROSCOPIC NEPHRECTOMY Right 05/21/2017   Procedure: LAPAROSCOPIC RADICAL NEPHRECTOMY;  Surgeon: Raynelle Bring, MD;  Location: [facility];  Service: Urology;  Laterality: Right;  . ortho surgery on fingers  2005 & 2008   Dr. Lorelle Formosa finger release  . PROSTATECTOMY  1999   Dr.  Rosana Hoes   Social History   Socioeconomic History  . Marital status: Married    Spouse name: Not on file  . Number of children: 2  . Years of education: Not on file  . Highest education level: Not on file  Occupational History  . Occupation: retired from [employer]  . Financial resource strain: Not on file  . Food insecurity:    Worry: Not on file    Inability: Not on file  . Transportation needs:    Medical: Not on file    Non-medical: Not on file  Tobacco Use  . Smoking status: Never Smoker  . Smokeless tobacco: Never Used  Substance and Sexual Activity  . Alcohol use: No    Alcohol/week: 0.0 oz  . Drug use: No  . Sexual activity: Yes  Lifestyle  . Physical activity:    Days per week: Not on file    Minutes per session: Not on file  . Stress: Not on file  Relationships  . Social connections:    Talks on phone: Not on file    Gets together: Not on file    Attends religious service: Not on file    Active member of club or organization: Not on file    Attends meetings of clubs or organizations: Not on file    Relationship status: Not on file  Other Topics Concern  . Not on file  Social History Narrative  . Not on file   Outpatient Encounter Medications as of 07/26/2017  Medication Sig  . allopurinol (ZYLOPRIM) 300 MG tablet TAKE 1 TABLET BY MOUTH ONCE DAILY  . amLODipine (NORVASC) 10 MG tablet TAKE 1 TABLET EVERY MORNING  . BD VEO INSULIN SYR ULTRAFINE 31G X 15/64" 1 ML MISC USE TWICE A DAY AS DIRECTED  . clobetasol (TEMOVATE) 0.05 % external solution Apply 1 application 2 (two) times a week topically. To scalp  . diphenhydrAMINE (BENADRYL) 25 MG tablet Take 25 mg by mouth daily as needed for allergies.  . fluticasone (CUTIVATE) 0.05 % cream Apply 1 application 2 (two) times a week topically. To face for itching and flaking  . fluticasone (FLONASE) 50 MCG/ACT nasal spray Place 2 sprays daily as needed into both nostrils for allergies or rhinitis.  Marland Kitchen  gabapentin (NEURONTIN) 600 MG tablet Take 600-1,200 mg by mouth See admin instructions. Take 1200 mg in the morning and 600 mg at night  . HYDROcodone-acetaminophen (NORCO) 5-325 MG tablet Take 1-2 tablets by mouth every 6 (six) hours as needed for moderate pain or severe pain.  . metoprolol (LOPRESSOR) 50 MG tablet TAKE 1 TABLET TWICE DAILY  . NOVOLIN 70/30 (70-30) 100 UNIT/ML injection INJECT 40 UNITS INTO THE SKIN 2 TIMES A DAY BEFORE MEAL INJECT 40 UNITS BREAKFAST& 30 UNITS SUPPER (Patient taking differently: INJECT 40 UNITS AT BREAKFAST & 30 UNITS AT SUPPER)  . ONE TOUCH ULTRA TEST test strip USE TO TEST BLOOD SUGAR TWICE DAILY.  Marland Kitchen ONETOUCH DELICA LANCETS 69C MISC USE TO TEST BLOOD SUGAR TWICE DAILY.  . pravastatin (PRAVACHOL) 80 MG tablet Take 80 mg by mouth 2 (two) times a week.   Marland Kitchen Propylene Glycol (SYSTANE BALANCE OP) Apply 1 drop as needed to eye (irritation).  . Rivaroxaban (XARELTO) 15 MG TABS tablet Take 15 mg by mouth daily.   . [DISCONTINUED] metFORMIN (GLUCOPHAGE-XR) 500 MG 24 hr tablet TAKE 1 TABLET (500 MG TOTAL) DAILY WITH BREAKFAST BY MOUTH.  . [DISCONTINUED] metFORMIN (GLUMETZA) 500 MG (MOD) 24 hr tablet Take 500 mg by mouth daily with breakfast.  . [DISCONTINUED] simvastatin (ZOCOR) 40 MG tablet Takes 1/2 tablet daily bedtime   No facility-administered encounter medications on file as of 07/26/2017.    ALLERGIES: Allergies  Allergen Reactions  . Ace Inhibitors Swelling    REACTION: angio edema (swelling of lips)  . Metformin And Related     Itching in higher doses    VACCINATION STATUS: Immunization History  Administered Date(s) Administered  . Influenza Split 11/27/2011, 12/30/2012, 01/01/2014  . Influenza Whole 01/27/2009, 11/30/2009, 11/17/2010  . Influenza, High Dose Seasonal PF 12/21/2015  . Pneumococcal Polysaccharide-23 12/02/2008    Diabetes  He presents for his follow-up diabetic visit. He has type 2 diabetes mellitus. Onset time: Diagnosed approximately   at age 77. His disease course has been improving. Pertinent negatives for hypoglycemia include no confusion, headaches, pallor or seizures. Pertinent negatives for diabetes include no chest pain, no fatigue, no polydipsia, no polyphagia, no polyuria and no weakness. Symptoms are improving. Risk factors for coronary artery disease include diabetes mellitus, dyslipidemia, male sex, hypertension and sedentary lifestyle. Current diabetic treatment includes insulin injections. His weight is fluctuating minimally. He is following a generally unhealthy diet. Meal planning includes ADA exchanges. He participates in exercise intermittently. His breakfast blood glucose range is generally 130-140 mg/dl. His lunch blood glucose range is generally 140-180 mg/dl. His dinner blood glucose range is generally 180-200 mg/dl. His overall blood  glucose range is 140-180 mg/dl.  Hypertension  This is a chronic problem. The current episode started more than 1 year ago. The problem is controlled. Pertinent negatives include no chest pain, headaches, neck pain, palpitations or shortness of breath. Past treatments include calcium channel blockers.  Hyperlipidemia  This is a chronic problem. The current episode started more than 1 year ago. The problem is uncontrolled. Pertinent negatives include no chest pain, myalgias or shortness of breath. Current antihyperlipidemic treatment includes bile acid squestrants. Risk factors for coronary artery disease include dyslipidemia and diabetes mellitus.     Review of Systems  Constitutional: Negative for fatigue and unexpected weight change.  HENT: Negative for dental problem, mouth sores and trouble swallowing.   Eyes: Negative for visual disturbance.  Respiratory: Negative for cough, choking, chest tightness, shortness of breath and wheezing.   Cardiovascular: Negative for chest pain, palpitations and leg swelling.  Gastrointestinal: Negative for abdominal distention, abdominal pain,  constipation, diarrhea, nausea and vomiting.  Endocrine: Negative for polydipsia, polyphagia and polyuria.  Genitourinary: Negative for dysuria, flank pain, hematuria and urgency.  Musculoskeletal: Negative for back pain, gait problem, joint swelling, myalgias and neck pain.  Skin: Negative for pallor, rash and wound.  Neurological: Negative for seizures, syncope, weakness, numbness and headaches.  Psychiatric/Behavioral: Negative for confusion, dysphoric mood, hallucinations and suicidal ideas.    Objective:    BP 127/66   Pulse 67   Ht '6\' 1"'  (1.854 m)   Wt 229 lb (103.9 kg)   BMI 30.21 kg/m   Wt Readings from Last 3 Encounters:  07/26/17 229 lb (103.9 kg)  05/21/17 225 lb (102.1 kg)  05/14/17 225 lb (102.1 kg)    Physical Exam  Constitutional: He is oriented to person, place, and time. He appears well-developed. He is cooperative.  HENT:  Head: Normocephalic and atraumatic.  Eyes: EOM are normal.  Neck: Normal range of motion. Neck supple. No tracheal deviation present. No thyromegaly present.  Cardiovascular: Normal rate. Exam reveals no gallop.  No murmur heard. Pulses:      Carotid pulses are 2+ on the right side.      Radial pulses are 2+ on the right side.       Dorsalis pedis pulses are 2+ on the right side.       Posterior tibial pulses are 2+ on the right side.  Pulmonary/Chest: Effort normal. No respiratory distress. He has no wheezes.  Abdominal: Bowel sounds are normal. He exhibits no distension. There is no hepatosplenomegaly. There is no tenderness. There is no guarding and no CVA tenderness.  Musculoskeletal: He exhibits no edema.       Right shoulder: He exhibits no swelling and no deformity.  Lymphadenopathy:       Head (right side): No submental and no submandibular adenopathy present.       Head (left side): No submental and no submandibular adenopathy present.       Right cervical: No superficial cervical adenopathy present.      Left cervical: No  superficial cervical adenopathy present.       Right: No supraclavicular adenopathy present.       Left: No supraclavicular adenopathy present.  Neurological: He is alert and oriented to person, place, and time. He has normal strength and normal reflexes. No cranial nerve deficit or sensory deficit. Gait normal.  Skin: Skin is warm and dry. No rash noted. No cyanosis. Nails show no clubbing.  Psychiatric: He has a normal mood and affect. His speech is  normal. Judgment normal. Cognition and memory are normal.   Recent Results (from the past 2160 hour(s))  Glucose, capillary     Status: Abnormal   Collection Time: 05/14/17 10:05 AM  Result Value Ref Range   Glucose-Capillary 210 (H) 65 - 99 mg/dL  ABO/Rh     Status: None   Collection Time: 05/14/17 10:46 AM  Result Value Ref Range   ABO/RH(D)      A POS Performed at [facility], [place] [address], [place], [place] [address]   Basic metabolic panel     Status: Abnormal   Collection Time: 05/14/17 10:50 AM  Result Value Ref Range   Sodium 140 135 - 145 mmol/L   Potassium 4.3 3.5 - 5.1 mmol/L   Chloride 103 101 - 111 mmol/L   CO2 30 22 - 32 mmol/L   Glucose, Bld 144 (H) 65 - 99 mg/dL   BUN 14 6 - 20 mg/dL   Creatinine, Ser 1.23 0.61 - 1.24 mg/dL   Calcium 9.1 8.9 - 10.3 mg/dL   GFR calc non Af Amer 55 (L) >60 mL/min   GFR calc Af Amer >60 >60 mL/min    Comment: (NOTE) The eGFR has been calculated using the CKD EPI equation. This calculation has not been validated in all clinical situations. eGFR's persistently <60 mL/min signify possible Chronic Kidney Disease.    Anion gap 7 5 - 15    Comment: Performed at [facility], [place] [address] [place], [place] [address]  CBC     Status: Abnormal   Collection Time: 05/14/17 10:50 AM  Result Value Ref Range   WBC 8.0 4.0 - 10.5 K/uL   RBC 4.73 4.22 - 5.81 MIL/uL   Hemoglobin 15.3 13.0 - 17.0 g/dL   HCT 43.8 39.0 - 52.0 %   MCV 92.6 78.0 - 100.0 fL    MCH 32.3 26.0 - 34.0 pg   MCHC 34.9 30.0 - 36.0 g/dL   RDW 13.0 11.5 - 15.5 %   Platelets 142 (L) 150 - 400 K/uL    Comment: Performed at [facility], [place] [address], [place], [place] [address]  Type and screen     Status: None   Collection Time: 05/14/17 10:50 AM  Result Value Ref Range   ABO/RH(D) A POS    Antibody Screen NEG    Sample Expiration 05/24/2017    Extend sample reason      NO TRANSFUSIONS OR PREGNANCY IN THE PAST 3 MONTHS Performed at [facility], [place] [address] [place], [place] [address]   Hemoglobin A1c     Status: Abnormal   Collection Time: 05/14/17 10:50 AM  Result Value Ref Range   Hgb A1c MFr Bld 7.3 (H) 4.8 - 5.6 %    Comment: (NOTE) Pre diabetes:          5.7%-6.4% Diabetes:              >6.4% Glycemic control for   <7.0% adults with diabetes    Mean Plasma Glucose 162.81 mg/dL    Comment: Performed at [place] [address] [place], [place] [address]  Glucose, capillary     Status: None   Collection Time: 05/21/17 10:19 AM  Result Value Ref Range   Glucose-Capillary 91 65 - 99 mg/dL  Glucose, capillary     Status: None   Collection Time: 05/21/17  2:37 PM  Result Value Ref Range   Glucose-Capillary 93 65 -  99 mg/dL  Basic metabolic panel     Status: Abnormal   Collection Time: 05/21/17  2:39 PM  Result Value Ref Range   Sodium 139 135 - 145 mmol/L   Potassium 3.7 3.5 - 5.1 mmol/L   Chloride 104 101 - 111 mmol/L   CO2 26 22 - 32 mmol/L   Glucose, Bld 93 65 - 99 mg/dL   BUN 14 6 - 20 mg/dL   Creatinine, Ser 1.30 (H) 0.61 - 1.24 mg/dL   Calcium 8.4 (L) 8.9 - 10.3 mg/dL   GFR calc non Af Amer 52 (L) >60 mL/min   GFR calc Af Amer 60 (L) >60 mL/min    Comment: (NOTE) The eGFR has been calculated using the CKD EPI equation. This calculation has not been validated in all clinical situations. eGFR's persistently <60 mL/min signify possible Chronic Kidney Disease.    Anion gap 9 5 - 15     Comment: Performed at [facility], [place] [address], [place], [place] [address]  Hemoglobin and hematocrit, blood     Status: None   Collection Time: 05/21/17  2:39 PM  Result Value Ref Range   Hemoglobin 14.7 13.0 - 17.0 g/dL   HCT 42.8 39.0 - 52.0 %    Comment: Performed at [facility], [place] [address], [place], [place] [address]  Glucose, capillary     Status: Abnormal   Collection Time: 05/21/17  5:33 PM  Result Value Ref Range   Glucose-Capillary 108 (H) 65 - 99 mg/dL  Glucose, capillary     Status: Abnormal   Collection Time: 05/21/17  8:33 PM  Result Value Ref Range   Glucose-Capillary 170 (H) 65 - 99 mg/dL   Comment 1 Notify RN   Glucose, capillary     Status: Abnormal   Collection Time: 05/22/17 12:26 AM  Result Value Ref Range   Glucose-Capillary 127 (H) 65 - 99 mg/dL   Comment 1 Notify RN   Glucose, capillary     Status: Abnormal   Collection Time: 05/22/17  4:28 AM  Result Value Ref Range   Glucose-Capillary 103 (H) 65 - 99 mg/dL   Comment 1 Notify RN   Basic metabolic panel     Status: Abnormal   Collection Time: 05/22/17  5:14 AM  Result Value Ref Range   Sodium 139 135 - 145 mmol/L   Potassium 4.2 3.5 - 5.1 mmol/L   Chloride 102 101 - 111 mmol/L   CO2 28 22 - 32 mmol/L   Glucose, Bld 101 (H) 65 - 99 mg/dL   BUN 18 6 - 20 mg/dL   Creatinine, Ser 1.80 (H) 0.61 - 1.24 mg/dL   Calcium 8.8 (L) 8.9 - 10.3 mg/dL   GFR calc non Af Amer 35 (L) >60 mL/min   GFR calc Af Amer 40 (L) >60 mL/min    Comment: (NOTE) The eGFR has been calculated using the CKD EPI equation. This calculation has not been validated in all clinical situations. eGFR's persistently <60 mL/min signify possible Chronic Kidney Disease.    Anion gap 9 5 - 15    Comment: Performed at [facility], [place] [address] [place], [place] [address]  Hemoglobin and hematocrit, blood     Status: None   Collection Time: 05/22/17  5:14 AM  Result Value  Ref Range   Hemoglobin 15.5 13.0 - 17.0 g/dL   HCT 43.7 39.0 - 52.0 %    Comment: Performed at [facility],  [place] [address], [place], [place] [address]  Glucose, capillary     Status: Abnormal   Collection Time: 05/22/17  7:34 AM  Result Value Ref Range   Glucose-Capillary 105 (H) 65 - 99 mg/dL  Glucose, capillary     Status: Abnormal   Collection Time: 05/22/17 12:07 PM  Result Value Ref Range   Glucose-Capillary 153 (H) 65 - 99 mg/dL  Glucose, capillary     Status: Abnormal   Collection Time: 05/22/17  5:10 PM  Result Value Ref Range   Glucose-Capillary 121 (H) 65 - 99 mg/dL  Glucose, capillary     Status: Abnormal   Collection Time: 05/22/17  8:57 PM  Result Value Ref Range   Glucose-Capillary 195 (H) 65 - 99 mg/dL   Comment 1 Notify RN   Glucose, capillary     Status: Abnormal   Collection Time: 05/23/17 12:33 AM  Result Value Ref Range   Glucose-Capillary 141 (H) 65 - 99 mg/dL   Comment 1 Notify RN   Glucose, capillary     Status: Abnormal   Collection Time: 05/23/17  3:59 AM  Result Value Ref Range   Glucose-Capillary 111 (H) 65 - 99 mg/dL   Comment 1 Notify RN   Basic metabolic panel     Status: Abnormal   Collection Time: 05/23/17  5:28 AM  Result Value Ref Range   Sodium 137 135 - 145 mmol/L   Potassium 4.0 3.5 - 5.1 mmol/L   Chloride 105 101 - 111 mmol/L   CO2 25 22 - 32 mmol/L   Glucose, Bld 115 (H) 65 - 99 mg/dL   BUN 23 (H) 6 - 20 mg/dL   Creatinine, Ser 2.18 (H) 0.61 - 1.24 mg/dL   Calcium 8.4 (L) 8.9 - 10.3 mg/dL   GFR calc non Af Amer 28 (L) >60 mL/min   GFR calc Af Amer 32 (L) >60 mL/min    Comment: (NOTE) The eGFR has been calculated using the CKD EPI equation. This calculation has not been validated in all clinical situations. eGFR's persistently <60 mL/min signify possible Chronic Kidney Disease.    Anion gap 7 5 - 15    Comment: Performed at [facility], [place] [address] [place], [place] [address]  Glucose,  capillary     Status: Abnormal   Collection Time: 05/23/17  7:31 AM  Result Value Ref Range   Glucose-Capillary 128 (H) 65 - 99 mg/dL  Glucose, capillary     Status: Abnormal   Collection Time: 05/23/17 11:54 AM  Result Value Ref Range   Glucose-Capillary 140 (H) 65 - 99 mg/dL  Basic metabolic panel     Status: Abnormal   Collection Time: 05/23/17  1:44 PM  Result Value Ref Range   Sodium 139 135 - 145 mmol/L   Potassium 4.3 3.5 - 5.1 mmol/L   Chloride 105 101 - 111 mmol/L   CO2 26 22 - 32 mmol/L   Glucose, Bld 94 65 - 99 mg/dL   BUN 25 (H) 6 - 20 mg/dL   Creatinine, Ser 2.18 (H) 0.61 - 1.24 mg/dL   Calcium 8.6 (L) 8.9 - 10.3 mg/dL   GFR calc non Af Amer 28 (L) >60 mL/min   GFR calc Af Amer 32 (L) >60 mL/min    Comment: (NOTE) The eGFR has been calculated using the CKD EPI equation. This calculation has not been validated in all clinical situations. eGFR's persistently <60 mL/min signify possible Chronic Kidney Disease.    Anion gap  8 5 - 15    Comment: Performed at [facility], [place] [address], [place], [place] [address]  Glucose, capillary     Status: Abnormal   Collection Time: 05/23/17  5:40 PM  Result Value Ref Range   Glucose-Capillary 158 (H) 65 - 99 mg/dL  Glucose, capillary     Status: Abnormal   Collection Time: 05/23/17  8:03 PM  Result Value Ref Range   Glucose-Capillary 171 (H) 65 - 99 mg/dL  Glucose, capillary     Status: None   Collection Time: 05/23/17 11:48 PM  Result Value Ref Range   Glucose-Capillary 97 65 - 99 mg/dL  Glucose, capillary     Status: Abnormal   Collection Time: 05/24/17  5:26 AM  Result Value Ref Range   Glucose-Capillary 133 (H) 65 - 99 mg/dL  Glucose, capillary     Status: Abnormal   Collection Time: 05/24/17  7:57 AM  Result Value Ref Range   Glucose-Capillary 106 (H) 65 - 99 mg/dL  COMPLETE METABOLIC PANEL WITH GFR     Status: Abnormal   Collection Time: 07/19/17 10:18 AM  Result Value Ref Range   Glucose,  Bld 147 (H) 65 - 139 mg/dL    Comment: .        Non-fasting reference interval .    BUN 18 7 - 25 mg/dL   Creat 1.99 (H) 0.70 - 1.18 mg/dL    Comment: For patients >90 years of age, the reference limit for Creatinine is approximately 13% higher for people identified as African-American. .    GFR, Est Non African American 32 (L) > OR = 60 mL/min/1.74m   GFR, Est African American 37 (L) > OR = 60 mL/min/1.743m  BUN/Creatinine Ratio 9 6 - 22 (calc)   Sodium 141 135 - 146 mmol/L   Potassium 4.2 3.5 - 5.3 mmol/L   Chloride 105 98 - 110 mmol/L   CO2 31 20 - 32 mmol/L   Calcium 9.5 8.6 - 10.3 mg/dL   Total Protein 6.7 6.1 - 8.1 g/dL   Albumin 4.1 3.6 - 5.1 g/dL   Globulin 2.6 1.9 - 3.7 g/dL (calc)   AG Ratio 1.6 1.0 - 2.5 (calc)   Total Bilirubin 0.6 0.2 - 1.2 mg/dL   Alkaline phosphatase (APISO) 73 40 - 115 U/L   AST 23 10 - 35 U/L   ALT 18 9 - 46 U/L  Hemoglobin A1c     Status: Abnormal   Collection Time: 07/19/17 10:18 AM  Result Value Ref Range   Hgb A1c MFr Bld 7.4 (H) <5.7 % of total Hgb    Comment: For someone without known diabetes, a hemoglobin A1c value of 6.5% or greater indicates that they may have  diabetes and this should be confirmed with a follow-up  test. . For someone with known diabetes, a value <7% indicates  that their diabetes is well controlled and a value  greater than or equal to 7% indicates suboptimal  control. A1c targets should be individualized based on  duration of diabetes, age, comorbid conditions, and  other considerations. . Currently, no consensus exists regarding use of hemoglobin A1c for diagnosis of diabetes for children. .    Mean Plasma Glucose 166 (calc)   eAG (mmol/L) 9.2 (calc)   Lipid Panel     Component Value Date/Time   CHOL 165 03/17/2016 0714   TRIG 142 03/17/2016 0714   HDL 40 (L) 03/17/2016 0714   CHOLHDL 4.1 03/17/2016 075993  VLDL 28 03/17/2016 0714   LDLCALC 97 03/17/2016 0714   LDLDIRECT 147.5 06/10/2012 0930      Assessment & Plan:   1. Type 2 diabetes mellitus with stage 2 chronic kidney disease He came with stable blood glucose profile both fasting and postprandial, A1c remains near target at 7.4%.   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     Patient is advised to stick to a routine mealtimes to eat 3 meals  a day and avoid unnecessary snacks ( to snack only to correct hypoglycemia). -  Suggestion is made for him to avoid simple carbohydrates  from his diet including Cakes, Sweet Desserts / Pastries, Ice Cream, Soda (diet and regular), Sweet Tea, Candies, Chips, Cookies, Store Bought Juices, Alcohol in Excess of  1-2 drinks a day, Artificial Sweeteners, and "Sugar-free" Products. This will help patient to have stable blood glucose profile and potentially avoid unintended weight gain.   I advised him to  continue his current insulin regimen,  Novolin 70/30  40 units with breakfast and  30 units with supper when pre-meal blood glucose is above 70.  - He is advised to continue glucose monitoring at least 2 times daily-before breakfast and at supper and anytime as needed. - Patient is instructed to call back with extremes of blood glucose readings  less than 70 or greater than 300 mg/dl. -I advised him to discontinue metformin at this time.    2. Mixed hyperlipidemia - His repeat lipid panel showed improvement in his LDL to 95 from 115 and triglycerides at 142 from 250. - He is advised to continue pravastatin 10 mg p.o. nightly.    He is advised to continue with omega-3 fatty acids ,avoid butter and fried food and exercise regularly.  3. Essential hypertension  His blood pressure is controlled to target.  atient is advised to continue amlodipine.  4) vitamin D deficiency - New diagnosis for him. I discussed and initiated vitamin D 3 5000 units daily for the next  90 days.   - Time spent with the patient: 25 min, of which >50% was spent in reviewing his blood glucose logs , discussing his hypo- and hyper-glycemic episodes, reviewing his current and  previous labs and insulin doses and developing a plan to avoid hypo- and hyper-glycemia. Please refer to Patient Instructions for Blood Glucose Monitoring and Insulin/Medications Dosing Guide"  in media tab for additional information. Hector Rose participated in the discussions, expressed understanding, and voiced agreement with the above plans.  All questions were answered to his satisfaction. he is encouraged to contact clinic should he have any questions or concerns prior to his return visit.   Follow up plan: Return in about 4 months (around 11/26/2017) for follow up with pre-visit labs, meter, and logs.  Glade Lloyd, MD Phone: [phone]  Fax: [phone]  -  This note was partially dictated with voice recognition [id number]. Similar sounding words can be transcribed inadequately or may not  be corrected upon [email or web address]  07/26/2017, 4:29 PM

## 2017-07-26 NOTE — Patient Instructions (Signed)

## 2017-08-07 ENCOUNTER — Encounter: Payer: Self-pay | Admitting: Pulmonary Disease

## 2017-08-07 ENCOUNTER — Ambulatory Visit (INDEPENDENT_AMBULATORY_CARE_PROVIDER_SITE_OTHER): Payer: PPO | Admitting: Pulmonary Disease

## 2017-08-07 VITALS — BP 126/70 | HR 58 | Temp 98.0°F | Ht 73.5 in | Wt 226.4 lb

## 2017-08-07 DIAGNOSIS — M150 Primary generalized (osteo)arthritis: Secondary | ICD-10-CM | POA: Diagnosis not present

## 2017-08-07 DIAGNOSIS — N182 Chronic kidney disease, stage 2 (mild): Secondary | ICD-10-CM | POA: Diagnosis not present

## 2017-08-07 DIAGNOSIS — J948 Other specified pleural conditions: Secondary | ICD-10-CM

## 2017-08-07 DIAGNOSIS — N138 Other obstructive and reflux uropathy: Secondary | ICD-10-CM | POA: Diagnosis not present

## 2017-08-07 DIAGNOSIS — Z794 Long term (current) use of insulin: Secondary | ICD-10-CM

## 2017-08-07 DIAGNOSIS — N401 Enlarged prostate with lower urinary tract symptoms: Secondary | ICD-10-CM | POA: Diagnosis not present

## 2017-08-07 DIAGNOSIS — D49511 Neoplasm of unspecified behavior of right kidney: Secondary | ICD-10-CM | POA: Diagnosis not present

## 2017-08-07 DIAGNOSIS — Z86718 Personal history of other venous thrombosis and embolism: Secondary | ICD-10-CM

## 2017-08-07 DIAGNOSIS — M7022 Olecranon bursitis, left elbow: Secondary | ICD-10-CM | POA: Diagnosis not present

## 2017-08-07 DIAGNOSIS — R911 Solitary pulmonary nodule: Secondary | ICD-10-CM | POA: Diagnosis not present

## 2017-08-07 DIAGNOSIS — I10 Essential (primary) hypertension: Secondary | ICD-10-CM

## 2017-08-07 DIAGNOSIS — E1122 Type 2 diabetes mellitus with diabetic chronic kidney disease: Secondary | ICD-10-CM

## 2017-08-07 DIAGNOSIS — E782 Mixed hyperlipidemia: Secondary | ICD-10-CM | POA: Diagnosis not present

## 2017-08-07 DIAGNOSIS — M159 Polyosteoarthritis, unspecified: Secondary | ICD-10-CM

## 2017-08-07 NOTE — Progress Notes (Signed)
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 Medications as of 08/07/2017  Medication Sig  . allopurinol (ZYLOPRIM) 300 MG tablet TAKE 1 TABLET BY MOUTH ONCE DAILY  . amLODipine (NORVASC) 10 MG tablet TAKE 1 TABLET EVERY MORNING  . BD VEO INSULIN SYR ULTRAFINE 31G X 15/64" 1 ML MISC USE TWICE A DAY AS DIRECTED  . clobetasol (TEMOVATE) 0.05 % external solution Apply 1 application 2 (two) times a week topically. To scalp  . fluticasone (CUTIVATE) 0.05 % cream Apply 1 application 2 (two) times a week topically. To face for itching and flaking  . fluticasone (FLONASE) 50 MCG/ACT nasal spray Place 2 sprays daily as needed into both nostrils for allergies or rhinitis.  Marland Kitchen gabapentin (NEURONTIN) 600 MG tablet Take 600-1,200 mg by mouth See admin instructions. Take 1200 mg in the morning and 600 mg at night  . metoprolol (LOPRESSOR) 50 MG tablet TAKE 1 TABLET TWICE DAILY  . NOVOLIN 70/30 (70-30) 100 UNIT/ML injection INJECT 40 UNITS INTO THE SKIN 2 TIMES A DAY BEFORE MEAL INJECT 40 UNITS BREAKFAST& 30 UNITS SUPPER (Patient taking differently: INJECT 40 UNITS AT BREAKFAST & 30 UNITS AT SUPPER)  . ONE TOUCH ULTRA TEST test strip USE TO TEST BLOOD SUGAR TWICE DAILY.  Marland Kitchen ONETOUCH DELICA LANCETS 06C MISC USE TO TEST BLOOD SUGAR TWICE DAILY.  . pravastatin (PRAVACHOL) 80 MG tablet Take 80 mg by mouth 2 (two) times a week.   Marland Kitchen Propylene Glycol (SYSTANE BALANCE OP) Apply 1 drop as needed to eye (irritation).  . Rivaroxaban (XARELTO) 15 MG TABS tablet Take 15 mg by mouth daily.   . [DISCONTINUED] HYDROcodone-acetaminophen (NORCO) 5-325 MG tablet Take 1-2 tablets by mouth every 6 (six)  hours as needed for moderate pain or severe pain.  . diphenhydrAMINE (BENADRYL) 25 MG tablet Take 25 mg by mouth daily as needed for allergies.  . [DISCONTINUED] simvastatin (ZOCOR) 40 MG tablet Takes 1/2 tablet daily bedtime   No facility-administered encounter medications on file as of 08/07/2017.     Allergies  Allergen Reactions  . Ace Inhibitors Swelling    REACTION: angio edema (swelling of lips)  . Metformin And Related     Itching in higher doses     Immunization History  Administered Date(s) Administered  . Influenza Split 11/27/2011, 12/30/2012, 01/01/2014, 03/13/2017  . Influenza Whole 01/27/2009, 11/30/2009, 11/17/2010  . Influenza, High Dose Seasonal PF 12/21/2015  . Pneumococcal Polysaccharide-23 12/02/2008    Current Medications, Allergies, Past Medical History, Past Surgical History, Family History, and Social History were reviewed in [employer] record.    Review of Systems  See HPI - all other systems neg except as noted...   The patient complains of dyspnea on exertion.  The patient denies anorexia, fever, weight loss, weight gain, vision loss, decreased hearing, hoarseness, chest pain, syncope, peripheral edema, prolonged cough, headaches, hemoptysis, abdominal pain, melena, hematochezia, severe indigestion/heartburn, hematuria, incontinence, muscle weakness, suspicious skin lesions, transient blindness, difficulty walking, depression, unusual weight change, abnormal bleeding, enlarged lymph nodes, and angioedema.     Objective:   Physical Exam     WD, WN, overwt 77 y/o BM in NAD...  GENERAL:  Alert & oriented; pleasant & cooperative... HEENT:  [place]/AT, EOM-wnl, PERRLA, EACs-wax, TMs-wnl, NOSE-clear, THROAT-clear & wnl, no angioedema. NECK:  Supple w/ full ROM; no JVD; normal carotid impulses w/o bruits; no thyromegaly or nodules palpated; no lymphadenopathy. CHEST:  Clear to P & A; without wheezes/ rales/ or rhonchi. HEART:   Regular Rhythm; without murmurs/ rubs/ or gallops. ABDOMEN:  Soft & nontender; normal bowel sounds; no organomegaly or masses detected. RECTAL:  prostate 3+, no nodules, stool heme neg... EXT:  without deformities, mild arthritic changes; no varicose veins/ venous insuffic/ or edema. NEURO:  CN's intact; motor testing normal; sensory testing normal; gait normal & balance OK. DERM:  Keloids noted...  RADIOLOGY DATA:  Reviewed in the EPIC EMR & discussed w/ the patient...  LABORATORY DATA:  Reviewed in the EPIC EMR & discussed w/ the patient...   Assessment & Plan:    Pulm Nodule, min pleural plaque w/o known asbestos exposure, mild obstructive & mod restrictive defects>  Known lesion & calcif plaque right base above diaph seen on old CTs but not readily visible on plain films; he has no real resp symptoms & we are following...    08/07/17>   [place] remains stable from the pulmonary standpoint;  We will refer to the [facility] to eval his olecranon bursitis;  Plan f/u 67m w/ CXR.   Medical problems are now managed by DrGolding & DrNida in [place] >>  HBP>  Controlled on BBlocker, CCB; tol well, continue same... Hx DVT>  hx single episode left leg DVT in 2007- Dx in [place] & followed there; Coumadin stopped 2014 & developed new right leg DVT=> back on Xarelto... CHOL>  On Prav40 + off Feno; FLP 11/15 was fair, now work on diet & get wt down! DM>  He reports INTOL to Metform w/ itching; prev onGlimep4mg Qam & Januvia100mg Qpm but control was worse; referred to [facility] & now controlled on Novolin 70/30... Multinodular Goiter> eval & rx from [place] in [place]. GI> GERD> on PPI as needed; had f/u colon 2018 w/ several polyps reviewed... GU> BPH w/ open prostatectomy 1999 by DrDavis;  PSA remains WNL; he has remote hx kidney stone; prob of Hematuria & ?abn cytology in office; Cysto, retrogrades & bladder bxs were all neg 2/13 per DrBorden & serial MRIs showed growing cystic lesion right  kidney=> right radical nephrectomy 04/2017 [facility]/ Gout/ Hx CTS>  Stable on OTC anti-inflamm as needed and Allopurinol daily... Keloid>  Aware, no change...   Patient's Medications  New Prescriptions   No medications on file  Previous Medications   ALLOPURINOL (ZYLOPRIM) 300 MG TABLET    TAKE 1 TABLET BY MOUTH ONCE DAILY   AMLODIPINE (NORVASC) 10 MG TABLET    TAKE 1 TABLET EVERY MORNING   BD VEO INSULIN SYR ULTRAFINE 31G X 15/64" 1 ML MISC    USE TWICE A DAY AS DIRECTED   CLOBETASOL (TEMOVATE) 0.05 % EXTERNAL SOLUTION    Apply 1  application 2 (two) times a week topically. To scalp   DIPHENHYDRAMINE (BENADRYL) 25 MG TABLET    Take 25 mg by mouth daily as needed for allergies.   FLUTICASONE (CUTIVATE) 0.05 % CREAM    Apply 1 application 2 (two) times a week topically. To face for itching and flaking   FLUTICASONE (FLONASE) 50 MCG/ACT NASAL SPRAY    Place 2 sprays daily as needed into both nostrils for allergies or rhinitis.   GABAPENTIN (NEURONTIN) 600 MG TABLET    Take 600-1,200 mg by mouth See admin instructions. Take 1200 mg in the morning and 600 mg at night   METOPROLOL (LOPRESSOR) 50 MG TABLET    TAKE 1 TABLET TWICE DAILY   NOVOLIN 70/30 (70-30) 100 UNIT/ML INJECTION    INJECT 40 UNITS INTO THE SKIN 2 TIMES A DAY BEFORE MEAL INJECT 40 UNITS BREAKFAST& 30 UNITS SUPPER   ONE TOUCH ULTRA TEST TEST STRIP    USE TO TEST BLOOD SUGAR TWICE DAILY.   ONETOUCH DELICA LANCETS 40Z MISC    USE TO TEST BLOOD SUGAR TWICE DAILY.   PRAVASTATIN (PRAVACHOL) 80 MG TABLET    Take 80 mg by mouth 2 (two) times a week.    PROPYLENE GLYCOL (SYSTANE BALANCE OP)    Apply 1 drop as needed to eye (irritation).   RIVAROXABAN (XARELTO) 15 MG TABS TABLET    Take 15 mg by mouth daily.   Modified Medications   No medications on file  Discontinued Medications   HYDROCODONE-ACETAMINOPHEN (NORCO) 5-325 MG TABLET    Take 1-2 tablets by mouth every 6 (six) hours as needed for moderate pain or severe pain.

## 2017-08-07 NOTE — Patient Instructions (Signed)
Today we updated your med list in our EPIC system...    Continue your current medications the same...  We will request an appt at [facility] w/ Carole Binning for your Olecranon bursitis...  Stay as active as possible...  Call for any problems with your breathing...  Let's plan a follow up visit in 1mo with a CXR at that time.Marland KitchenMarland Kitchen

## 2017-08-17 ENCOUNTER — Other Ambulatory Visit: Payer: Self-pay | Admitting: "Endocrinology

## 2017-08-22 DIAGNOSIS — M19029 Primary osteoarthritis, unspecified elbow: Secondary | ICD-10-CM | POA: Diagnosis not present

## 2017-08-22 DIAGNOSIS — M7022 Olecranon bursitis, left elbow: Secondary | ICD-10-CM | POA: Diagnosis not present

## 2017-08-22 DIAGNOSIS — R200 Anesthesia of skin: Secondary | ICD-10-CM | POA: Diagnosis not present

## 2017-08-22 DIAGNOSIS — M542 Cervicalgia: Secondary | ICD-10-CM | POA: Diagnosis not present

## 2017-09-11 DIAGNOSIS — M25561 Pain in right knee: Secondary | ICD-10-CM | POA: Diagnosis not present

## 2017-09-11 DIAGNOSIS — M25562 Pain in left knee: Secondary | ICD-10-CM | POA: Diagnosis not present

## 2017-10-03 LAB — HM DIABETES EYE EXAM

## 2017-10-26 ENCOUNTER — Other Ambulatory Visit: Payer: Self-pay | Admitting: "Endocrinology

## 2017-11-01 DIAGNOSIS — I82501 Chronic embolism and thrombosis of unspecified deep veins of right lower extremity: Secondary | ICD-10-CM | POA: Diagnosis not present

## 2017-11-01 DIAGNOSIS — H6122 Impacted cerumen, left ear: Secondary | ICD-10-CM | POA: Diagnosis not present

## 2017-11-01 DIAGNOSIS — J302 Other seasonal allergic rhinitis: Secondary | ICD-10-CM | POA: Diagnosis not present

## 2017-11-01 DIAGNOSIS — Z1389 Encounter for screening for other disorder: Secondary | ICD-10-CM | POA: Diagnosis not present

## 2017-11-01 DIAGNOSIS — Z0001 Encounter for general adult medical examination with abnormal findings: Secondary | ICD-10-CM | POA: Diagnosis not present

## 2017-11-01 DIAGNOSIS — E782 Mixed hyperlipidemia: Secondary | ICD-10-CM | POA: Diagnosis not present

## 2017-11-01 DIAGNOSIS — I10 Essential (primary) hypertension: Secondary | ICD-10-CM | POA: Diagnosis not present

## 2017-11-01 DIAGNOSIS — Z681 Body mass index (BMI) 19 or less, adult: Secondary | ICD-10-CM | POA: Diagnosis not present

## 2017-11-01 DIAGNOSIS — E119 Type 2 diabetes mellitus without complications: Secondary | ICD-10-CM | POA: Diagnosis not present

## 2017-11-19 DIAGNOSIS — E119 Type 2 diabetes mellitus without complications: Secondary | ICD-10-CM | POA: Diagnosis not present

## 2017-11-20 DIAGNOSIS — E782 Mixed hyperlipidemia: Secondary | ICD-10-CM | POA: Diagnosis not present

## 2017-11-20 DIAGNOSIS — E119 Type 2 diabetes mellitus without complications: Secondary | ICD-10-CM | POA: Diagnosis not present

## 2017-11-20 DIAGNOSIS — N183 Chronic kidney disease, stage 3 (moderate): Secondary | ICD-10-CM | POA: Diagnosis not present

## 2017-11-20 DIAGNOSIS — E785 Hyperlipidemia, unspecified: Secondary | ICD-10-CM | POA: Diagnosis not present

## 2017-11-20 DIAGNOSIS — E1122 Type 2 diabetes mellitus with diabetic chronic kidney disease: Secondary | ICD-10-CM | POA: Diagnosis not present

## 2017-11-20 DIAGNOSIS — Z794 Long term (current) use of insulin: Secondary | ICD-10-CM | POA: Diagnosis not present

## 2017-11-21 LAB — COMPLETE METABOLIC PANEL WITH GFR
AG Ratio: 1.5 (calc) (ref 1.0–2.5)
ALBUMIN MSPROF: 4.3 g/dL (ref 3.6–5.1)
ALT: 18 U/L (ref 9–46)
AST: 21 U/L (ref 10–35)
Alkaline phosphatase (APISO): 81 U/L (ref 40–115)
BUN / CREAT RATIO: 10 (calc) (ref 6–22)
BUN: 21 mg/dL (ref 7–25)
CALCIUM: 9.9 mg/dL (ref 8.6–10.3)
CO2: 32 mmol/L (ref 20–32)
CREATININE: 2.03 mg/dL — AB (ref 0.70–1.18)
Chloride: 102 mmol/L (ref 98–110)
GFR, EST AFRICAN AMERICAN: 36 mL/min/{1.73_m2} — AB (ref >=60)
GFR, Est Non African American: 31 mL/min/{1.73_m2} — ABNORMAL LOW (ref >=60)
GLUCOSE: 95 mg/dL (ref 65–99)
Globulin: 2.9 g/dL (calc) (ref 1.9–3.7)
Potassium: 4.2 mmol/L (ref 3.5–5.3)
Sodium: 141 mmol/L (ref 135–146)
TOTAL PROTEIN: 7.2 g/dL (ref 6.1–8.1)
Total Bilirubin: 0.5 mg/dL (ref 0.2–1.2)

## 2017-11-21 LAB — MICROALBUMIN / CREATININE URINE RATIO
Creatinine, Urine: 71 mg/dL (ref 20–320)
Microalb Creat Ratio: 513 mcg/mg creat — ABNORMAL HIGH (ref <30)
Microalb, Ur: 36.4 mg/dL

## 2017-11-21 LAB — LIPID PANEL
CHOL/HDL RATIO: 4.8 (calc) (ref <5.0)
Cholesterol: 222 mg/dL — ABNORMAL HIGH (ref <200)
HDL: 46 mg/dL (ref >40)
LDL CHOLESTEROL (CALC): 152 mg/dL — AB
Non-HDL Cholesterol (Calc): 176 mg/dL (calc) — ABNORMAL HIGH (ref <130)
TRIGLYCERIDES: 118 mg/dL (ref <150)

## 2017-11-21 LAB — HEMOGLOBIN A1C
EAG (MMOL/L): 9.8 (calc)
HEMOGLOBIN A1C: 7.8 %{Hb} — AB (ref <5.7)
MEAN PLASMA GLUCOSE: 177 (calc)

## 2017-11-25 ENCOUNTER — Other Ambulatory Visit: Payer: Self-pay | Admitting: "Endocrinology

## 2017-11-26 ENCOUNTER — Encounter: Payer: Self-pay | Admitting: "Endocrinology

## 2017-11-26 ENCOUNTER — Ambulatory Visit (INDEPENDENT_AMBULATORY_CARE_PROVIDER_SITE_OTHER): Payer: PPO | Admitting: "Endocrinology

## 2017-11-26 VITALS — BP 125/72 | HR 65 | Ht 73.5 in | Wt 226.0 lb

## 2017-11-26 DIAGNOSIS — E782 Mixed hyperlipidemia: Secondary | ICD-10-CM | POA: Diagnosis not present

## 2017-11-26 DIAGNOSIS — I10 Essential (primary) hypertension: Secondary | ICD-10-CM

## 2017-11-26 DIAGNOSIS — N183 Chronic kidney disease, stage 3 unspecified: Secondary | ICD-10-CM

## 2017-11-26 DIAGNOSIS — Z794 Long term (current) use of insulin: Secondary | ICD-10-CM

## 2017-11-26 DIAGNOSIS — E1122 Type 2 diabetes mellitus with diabetic chronic kidney disease: Secondary | ICD-10-CM | POA: Diagnosis not present

## 2017-11-26 MED ORDER — ROSUVASTATIN CALCIUM 20 MG PO TABS: 20.0000 mg | ORAL_TABLET | Freq: Every day | ORAL | 3 refills | Status: DC

## 2017-11-26 NOTE — Progress Notes (Signed)
Endocrinology follow-up note   Subjective:    Patient ID: Hector Rose, male    DOB: 11/13/1940,    Past Medical History:  Diagnosis Date  . Asthma 04-11-11   AS CHILD ONLY  . BPH (benign prostatic hypertrophy) with urinary obstruction   . Carpal tunnel syndrome    patient denies at 05/14/17 preop visit   . Difficult intubation 04-11-11   08-11-97 some issues with intubation/record with chart  . DJD (degenerative joint disease)   . DM (diabetes mellitus) ([place]) 04-11-11   tyep II   . DVT femoral (deep venous thrombosis) with thrombophlebitis ([place]) 04-11-11   ?'09-tx. on Xarelton x 3-4 years   . GERD (gastroesophageal reflux disease) 04-11-11   mild, no med in 1 month  . Gout 04-11-11    ankle 2 yrs ago  . Hemorrhoids   . Hypercholesteremia   . Hypertension   . Internal hemorrhoids   . Keloid 04-11-11   multiple-arms,back, chest  . Pulmonary nodule 04-11-11   "PT. NOT AWARE" -denies problems with breathing  . Renal calculus   . Thyroid disease    hypothyroid   Past Surgical History:  Procedure Laterality Date  . cataract surgery  12/2013  . COLONOSCOPY WITH PROPOFOL N/A 02/05/2017   Procedure: COLONOSCOPY WITH PROPOFOL;  Surgeon: Ladene Artist, MD;  Location: [facility];  Service: Endoscopy;  Laterality: N/A;  . COSMETIC SURGERY  2000   Keloid injections  . CYSTOSCOPY WITH BIOPSY  04/14/2011   Procedure: CYSTOSCOPY WITH BIOPSY;  Surgeon: Dutch Gray, MD;  Location: [facility];  Service: Urology;  Laterality: N/A;  Bladder Biopsies   . CYSTOSCOPY/RETROGRADE/URETEROSCOPY  04/14/2011   Procedure: CYSTOSCOPY/RETROGRADE/URETEROSCOPY;  Surgeon: Dutch Gray, MD;  Location: [facility];  Service: Urology;  Laterality: Bilateral;  (Bil) RPG   . LAPAROSCOPIC NEPHRECTOMY Right 05/21/2017   Procedure: LAPAROSCOPIC RADICAL NEPHRECTOMY;  Surgeon: Raynelle Bring, MD;  Location: [facility];  Service: Urology;  Laterality: Right;  . ortho surgery on fingers  2005 & 2008   Dr. Lorelle Formosa finger release   . PROSTATECTOMY  1999   Dr. Rosana Hoes   Social History   Socioeconomic History  . Marital status: Married    Spouse name: Not on file  . Number of children: 2  . Years of education: Not on file  . Highest education level: Not on file  Occupational History  . Occupation: retired from [employer]  . Financial resource strain: Not on file  . Food insecurity:    Worry: Not on file    Inability: Not on file  . Transportation needs:    Medical: Not on file    Non-medical: Not on file  Tobacco Use  . Smoking status: Never Smoker  . Smokeless tobacco: Never Used  Substance and Sexual Activity  . Alcohol use: No    Alcohol/week: 0.0 standard drinks  . Drug use: No  . Sexual activity: Yes  Lifestyle  . Physical activity:    Days per week: Not on file    Minutes per session: Not on file  . Stress: Not on file  Relationships  . Social connections:    Talks on phone: Not on file    Gets together: Not on file    Attends religious service: Not on file    Active member of club or organization: Not on file    Attends meetings of clubs or organizations: Not on file    Relationship status: Not on file  Other Topics Concern  .  Not on file  Social History Narrative  . Not on file   Outpatient Encounter Medications as of 11/26/2017  Medication Sig  . allopurinol (ZYLOPRIM) 300 MG tablet TAKE 1 TABLET BY MOUTH ONCE DAILY  . amLODipine (NORVASC) 10 MG tablet TAKE 1 TABLET EVERY MORNING  . BD VEO INSULIN SYR ULTRAFINE 31G X 15/64" 1 ML MISC USE TWICE A DAY AS DIRECTED  . clobetasol (TEMOVATE) 0.05 % external solution Apply 1 application 2 (two) times a week topically. To scalp  . [facility] D3 5000 units capsule TAKE 1 CAPSULE BY MOUTH DAILY  . diphenhydrAMINE (BENADRYL) 25 MG tablet Take 25 mg by mouth daily as needed for allergies.  . fluticasone (CUTIVATE) 0.05 % cream Apply 1 application 2 (two) times a week topically. To face for itching and flaking  . fluticasone (FLONASE) 50  MCG/ACT nasal spray Place 2 sprays daily as needed into both nostrils for allergies or rhinitis.  Marland Kitchen gabapentin (NEURONTIN) 600 MG tablet Take 600-1,200 mg by mouth See admin instructions. Take 1200 mg in the morning and 600 mg at night  . metoprolol (LOPRESSOR) 50 MG tablet TAKE 1 TABLET TWICE DAILY  . NOVOLIN 70/30 (70-30) 100 UNIT/ML injection INJECT 40 UNITS INTO THE SKIN 2 TIMES A DAY BEFORE MEAL INJECT 40 UNITS BREAKFAST& 30 UNITS SUPPER (Patient taking differently: INJECT 40 UNITS AT BREAKFAST & 30 UNITS AT SUPPER)  . ONE TOUCH ULTRA TEST test strip USE TO TEST BLOOD SUGAR TWICE DAILY.  Marland Kitchen ONETOUCH DELICA LANCETS 78H MISC USE TO TEST BLOOD SUGAR TWICE DAILY.  Marland Kitchen Propylene Glycol (SYSTANE BALANCE OP) Apply 1 drop as needed to eye (irritation).  . Rivaroxaban (XARELTO) 15 MG TABS tablet Take 15 mg by mouth daily.   . rosuvastatin (CRESTOR) 20 MG tablet Take 1 tablet (20 mg total) by mouth daily.  . [DISCONTINUED] pravastatin (PRAVACHOL) 80 MG tablet Take 80 mg by mouth 2 (two) times a week.   . [DISCONTINUED] simvastatin (ZOCOR) 40 MG tablet Takes 1/2 tablet daily bedtime   No facility-administered encounter medications on file as of 11/26/2017.    ALLERGIES: Allergies  Allergen Reactions  . Ace Inhibitors Swelling    REACTION: angio edema (swelling of lips)  . Metformin And Related     Itching in higher doses    VACCINATION STATUS: Immunization History  Administered Date(s) Administered  . Influenza Split 11/27/2011, 12/30/2012, 01/01/2014, 03/13/2017  . Influenza Whole 01/27/2009, 11/30/2009, 11/17/2010  . Influenza, High Dose Seasonal PF 12/21/2015  . Pneumococcal Polysaccharide-23 12/02/2008    Diabetes  He presents for his follow-up diabetic visit. He has type 2 diabetes mellitus. Onset time: Diagnosed approximately  at age 77. His disease course has been worsening. Pertinent negatives for hypoglycemia include no confusion, headaches, pallor or seizures. Pertinent negatives for  diabetes include no chest pain, no fatigue, no polydipsia, no polyphagia, no polyuria and no weakness. Symptoms are worsening. Risk factors for coronary artery disease include diabetes mellitus, dyslipidemia, male sex, hypertension and sedentary lifestyle. Current diabetic treatment includes insulin injections. His weight is fluctuating minimally. He is following a generally unhealthy diet. Meal planning includes ADA exchanges. He participates in exercise intermittently. His breakfast blood glucose range is generally 130-140 mg/dl. His lunch blood glucose range is generally 140-180 mg/dl. His dinner blood glucose range is generally 180-200 mg/dl. His overall blood glucose range is 140-180 mg/dl.  Hypertension  This is a chronic problem. The current episode started more than 1 year ago. The problem is controlled. Pertinent negatives include  no chest pain, headaches, neck pain, palpitations or shortness of breath. Past treatments include calcium channel blockers.  Hyperlipidemia  This is a chronic problem. The current episode started more than 1 year ago. The problem is uncontrolled. Pertinent negatives include no chest pain, myalgias or shortness of breath. Current antihyperlipidemic treatment includes bile acid squestrants. Risk factors for coronary artery disease include dyslipidemia and diabetes mellitus.     Review of Systems  Constitutional: Negative for fatigue and unexpected weight change.  HENT: Negative for dental problem, mouth sores and trouble swallowing.   Eyes: Negative for visual disturbance.  Respiratory: Negative for cough, choking, chest tightness, shortness of breath and wheezing.   Cardiovascular: Negative for chest pain, palpitations and leg swelling.  Gastrointestinal: Negative for abdominal distention, abdominal pain, constipation, diarrhea, nausea and vomiting.  Endocrine: Negative for polydipsia, polyphagia and polyuria.  Genitourinary: Negative for dysuria, flank pain,  hematuria and urgency.  Musculoskeletal: Negative for back pain, gait problem, joint swelling, myalgias and neck pain.  Skin: Negative for pallor, rash and wound.  Neurological: Negative for seizures, syncope, weakness, numbness and headaches.  Psychiatric/Behavioral: Negative for confusion, dysphoric mood, hallucinations and suicidal ideas.    Objective:    BP 125/72   Pulse 65   Ht 6' 1.5" (1.867 m)   Wt 226 lb (102.5 kg)   BMI 29.41 kg/m   Wt Readings from Last 3 Encounters:  11/26/17 226 lb (102.5 kg)  08/07/17 226 lb 6.4 oz (102.7 kg)  07/26/17 229 lb (103.9 kg)    Physical Exam  Constitutional: He is oriented to person, place, and time. He appears well-developed. He is cooperative.  HENT:  Head: Normocephalic and atraumatic.  Eyes: EOM are normal.  Neck: Normal range of motion. Neck supple. No tracheal deviation present. No thyromegaly present.  Cardiovascular: Normal rate. Exam reveals no gallop.  No murmur heard. Pulses:      Carotid pulses are 2+ on the right side.      Radial pulses are 2+ on the right side.       Dorsalis pedis pulses are 2+ on the right side.       Posterior tibial pulses are 2+ on the right side.  Pulmonary/Chest: Effort normal. No respiratory distress. He has no wheezes.  Abdominal: Bowel sounds are normal. He exhibits no distension. There is no hepatosplenomegaly. There is no tenderness. There is no guarding and no CVA tenderness.  Musculoskeletal: He exhibits no edema.       Right shoulder: He exhibits no swelling and no deformity.  Lymphadenopathy:       Head (right side): No submental and no submandibular adenopathy present.       Head (left side): No submental and no submandibular adenopathy present.       Right cervical: No superficial cervical adenopathy present.      Left cervical: No superficial cervical adenopathy present.       Right: No supraclavicular adenopathy present.       Left: No supraclavicular adenopathy present.   Neurological: He is alert and oriented to person, place, and time. He has normal strength and normal reflexes. No cranial nerve deficit or sensory deficit. Gait normal.  Skin: Skin is warm and dry. No rash noted. No cyanosis. Nails show no clubbing.  Psychiatric: He has a normal mood and affect. His speech is normal. Judgment normal. Cognition and memory are normal.   Recent Results (from the past 2160 hour(s))  [facility] DIABETES EYE EXAM     Status:  None   Collection Time: 10/03/17 11:16 AM  Result Value Ref Range   [facility] Diabetic Eye Exam  No Retinopathy  COMPLETE METABOLIC PANEL WITH GFR     Status: Abnormal   Collection Time: 11/20/17  7:45 AM  Result Value Ref Range   Glucose, Bld 95 65 - 99 mg/dL    Comment: .            Fasting reference interval .    BUN 21 7 - 25 mg/dL   Creat 2.03 (H) 0.70 - 1.18 mg/dL    Comment: For patients >78 years of age, the reference limit for Creatinine is approximately 13% higher for people identified as African-American. .    GFR, Est Non African American 31 (L) > OR = 60 mL/min/1.53m2   GFR, Est African American 36 (L) > OR = 60 mL/min/1.73m2   BUN/Creatinine Ratio 10 6 - 22 (calc)   Sodium 141 135 - 146 mmol/L   Potassium 4.2 3.5 - 5.3 mmol/L   Chloride 102 98 - 110 mmol/L   CO2 32 20 - 32 mmol/L   Calcium 9.9 8.6 - 10.3 mg/dL   Total Protein 7.2 6.1 - 8.1 g/dL   Albumin 4.3 3.6 - 5.1 g/dL   Globulin 2.9 1.9 - 3.7 g/dL (calc)   AG Ratio 1.5 1.0 - 2.5 (calc)   Total Bilirubin 0.5 0.2 - 1.2 mg/dL   Alkaline phosphatase (APISO) 81 40 - 115 U/L   AST 21 10 - 35 U/L   ALT 18 9 - 46 U/L  Hemoglobin A1c     Status: Abnormal   Collection Time: 11/20/17  7:45 AM  Result Value Ref Range   Hgb A1c MFr Bld 7.8 (H) <5.7 % of total Hgb    Comment: For someone without known diabetes, a hemoglobin A1c value of 6.5% or greater indicates that they may have  diabetes and this should be confirmed with a follow-up  test. . For someone with known diabetes,  a value <7% indicates  that their diabetes is well controlled and a value  greater than or equal to 7% indicates suboptimal  control. A1c targets should be individualized based on  duration of diabetes, age, comorbid conditions, and  other considerations. . Currently, no consensus exists regarding use of hemoglobin A1c for diagnosis of diabetes for children. .    Mean Plasma Glucose 177 (calc)   eAG (mmol/L) 9.8 (calc)  Lipid panel     Status: Abnormal   Collection Time: 11/20/17  7:45 AM  Result Value Ref Range   Cholesterol 222 (H) <200 mg/dL   HDL 46 >40 mg/dL   Triglycerides 118 <150 mg/dL   LDL Cholesterol (Calc) 152 (H) mg/dL (calc)    Comment: Reference range: <100 . Desirable range <100 mg/dL for primary prevention;   <70 mg/dL for patients with CHD or diabetic patients  with > or = 2 CHD risk factors. Marland Kitchen LDL-C is now calculated using the Martin-Hopkins  calculation, which is a validated novel method providing  better accuracy than the Friedewald equation in the  estimation of LDL-C.  Cresenciano Genre et al. Annamaria Helling. 9892;119(41): 2061-2068  (http://education.[facility].com/faq/FAQ164)    Total CHOL/HDL Ratio 4.8 <5.0 (calc)   Non-HDL Cholesterol (Calc) 176 (H) <130 mg/dL (calc)    Comment: For patients with diabetes plus 1 major ASCVD risk  factor, treating to a non-HDL-C goal of <100 mg/dL  (LDL-C of <70 mg/dL) is considered a therapeutic  option.   Microalbumin / creatinine urine  ratio     Status: Abnormal   Collection Time: 11/20/17  7:45 AM  Result Value Ref Range   Creatinine, Urine 71 20 - 320 mg/dL   Microalb, Ur 36.4 mg/dL    Comment: Verified by repeat analysis. Marland Kitchen Reference Range Not established    Microalb Creat Ratio 513 (H) <30 mcg/mg creat    Comment: . The ADA defines abnormalities in albumin excretion as follows: Marland Kitchen Category         Result (mcg/mg creatinine) . Normal                    <30 Microalbuminuria         30-299  Clinical  albuminuria   > OR = 300 . The ADA recommends that at least two of three specimens collected within a 3-6 month period be abnormal before considering a patient to be within a diagnostic category.    Lipid Panel     Component Value Date/Time   CHOL 222 (H) 11/20/2017 0745   TRIG 118 11/20/2017 0745   HDL 46 11/20/2017 0745   CHOLHDL 4.8 11/20/2017 0745   VLDL 28 03/17/2016 0714   LDLCALC 152 (H) 11/20/2017 0745   LDLDIRECT 147.5 06/10/2012 0930     Assessment & Plan:   1. Type 2 diabetes mellitus with stage 2 chronic kidney disease He came with fluctuating blood glucose profile, A1c of 7.8%.    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     Patient is advised to stick to a routine mealtimes to eat 3 meals  a day and avoid unnecessary snacks ( to snack only to correct hypoglycemia). -  Suggestion is made for him to avoid simple carbohydrates  from his diet including Cakes, Sweet Desserts / Pastries, Ice Cream, Soda (diet and regular), Sweet Tea, Candies, Chips, Cookies, Store Bought Juices, Alcohol in Excess of  1-2 drinks a day, Artificial Sweeteners, and "Sugar-free" Products. This will help patient to have stable blood glucose profile and potentially avoid unintended weight gain.  -He has missed at least 25% of his insulin injection opportunities for unclear reasons.  I discussed with him to be consistent in injecting his prescribed insulin if pre-meal blood glucose readings are above 90 mg/dL.   -He will be continued on the same insulin regimen,  Novolin 70/30  40 units with breakfast and  30 units with supper when pre-meal blood glucose is above 70.  - He is advised to continue glucose monitoring at least 2 times daily-before breakfast and at supper and anytime as needed. - Patient is instructed to call back with extremes of blood glucose readings  less than  70 or greater than 300 mg/dl. -He is not a candidate for metformin, SGLT2 inhibitors, nor incretin therapy.  2. Mixed hyperlipidemia - His repeat lipid panel showed significant loss of control to LDL of 166.  This mainly due to his inconsistency taking pravastatin due to  concerns of arthralgias.   -I discussed and switched his statin to Crestor 20 mg p.o. nightly.   - He is advised to continue with omega-3 fatty acids ,avoid butter and fried food and exercise regularly.  3. Essential hypertension  His blood pressure is controlled to target.  He is advised to continue amlodipine.  4) vitamin D deficiency -He is on ongoing supplement with  vitamin D 3 5000 units daily for the next 90 days.   -  Time spent with the patient: 25 min, of which >50% was spent in reviewing his blood glucose logs , discussing his hypo- and hyper-glycemic episodes, reviewing his current and  previous labs and insulin doses and developing a plan to avoid hypo- and hyper-glycemia. Please refer to Patient Instructions for Blood Glucose Monitoring and Insulin/Medications Dosing Guide"  in media tab for additional information. Hector Rose participated in the discussions, expressed understanding, and voiced agreement with the above plans.  All questions were answered to his satisfaction. he is encouraged to contact clinic should he have any questions or concerns prior to his return visit.   Follow up plan: Return in about 3 months (around 02/25/2018) for Follow up with Pre-visit Labs, Meter, and Logs.  Hector Lloyd, MD Phone: [phone]  Fax: [phone]  -  This note was partially dictated with voice recognition software. Similar sounding words can be transcribed inadequately or may not  be corrected upon review.  11/26/2017, 6:17 PM

## 2017-11-26 NOTE — Patient Instructions (Signed)

## 2017-11-29 ENCOUNTER — Ambulatory Visit (INDEPENDENT_AMBULATORY_CARE_PROVIDER_SITE_OTHER): Payer: PPO | Admitting: Otolaryngology

## 2017-11-29 DIAGNOSIS — J342 Deviated nasal septum: Secondary | ICD-10-CM

## 2017-11-29 DIAGNOSIS — J343 Hypertrophy of nasal turbinates: Secondary | ICD-10-CM

## 2017-11-29 DIAGNOSIS — J310 Chronic rhinitis: Secondary | ICD-10-CM | POA: Diagnosis not present

## 2018-01-01 ENCOUNTER — Ambulatory Visit (HOSPITAL_COMMUNITY)
Admission: RE | Admit: 2018-01-01 | Discharge: 2018-01-01 | Disposition: A | Discharge status: Home / Self Care | Payer: PPO | Source: Ambulatory Visit | Attending: Orthopedic Surgery | Admitting: Orthopedic Surgery

## 2018-01-01 ENCOUNTER — Other Ambulatory Visit (HOSPITAL_COMMUNITY): Payer: Self-pay | Admitting: Orthopedic Surgery

## 2018-01-01 DIAGNOSIS — M79661 Pain in right lower leg: Secondary | ICD-10-CM | POA: Diagnosis not present

## 2018-01-01 DIAGNOSIS — M79604 Pain in right leg: Secondary | ICD-10-CM | POA: Diagnosis not present

## 2018-01-01 DIAGNOSIS — M79662 Pain in left lower leg: Secondary | ICD-10-CM | POA: Diagnosis not present

## 2018-01-01 DIAGNOSIS — M1712 Unilateral primary osteoarthritis, left knee: Secondary | ICD-10-CM | POA: Diagnosis not present

## 2018-01-01 DIAGNOSIS — M79605 Pain in left leg: Secondary | ICD-10-CM | POA: Diagnosis not present

## 2018-01-09 ENCOUNTER — Other Ambulatory Visit: Payer: Self-pay | Admitting: Urology

## 2018-01-09 ENCOUNTER — Ambulatory Visit (HOSPITAL_COMMUNITY)
Admission: RE | Admit: 2018-01-09 | Discharge: 2018-01-09 | Disposition: A | Discharge status: Home / Self Care | Payer: PPO | Source: Ambulatory Visit | Attending: Urology | Admitting: Urology

## 2018-01-09 DIAGNOSIS — C641 Malignant neoplasm of right kidney, except renal pelvis: Secondary | ICD-10-CM | POA: Insufficient documentation

## 2018-01-09 DIAGNOSIS — R3912 Poor urinary stream: Secondary | ICD-10-CM | POA: Diagnosis not present

## 2018-01-09 DIAGNOSIS — N401 Enlarged prostate with lower urinary tract symptoms: Secondary | ICD-10-CM | POA: Diagnosis not present

## 2018-01-09 DIAGNOSIS — J984 Other disorders of lung: Secondary | ICD-10-CM | POA: Diagnosis not present

## 2018-01-10 DIAGNOSIS — E1129 Type 2 diabetes mellitus with other diabetic kidney complication: Secondary | ICD-10-CM | POA: Diagnosis not present

## 2018-01-10 DIAGNOSIS — Z9841 Cataract extraction status, right eye: Secondary | ICD-10-CM | POA: Diagnosis not present

## 2018-01-10 DIAGNOSIS — H35341 Macular cyst, hole, or pseudohole, right eye: Secondary | ICD-10-CM | POA: Diagnosis not present

## 2018-01-10 DIAGNOSIS — H25812 Combined forms of age-related cataract, left eye: Secondary | ICD-10-CM | POA: Diagnosis not present

## 2018-01-10 DIAGNOSIS — Z961 Presence of intraocular lens: Secondary | ICD-10-CM | POA: Diagnosis not present

## 2018-01-11 IMAGING — MR MR ABDOMEN WO/W CM
10 of 18 series · 22 of 48 positions shown · IV contrast (multihance)
Comparison: Multiple exams, including 02/24/2016

CLINICAL DATA: Right cystic renal lesion Bosniak category 3,
surveillance imaging. History of nephro blastoma ptosis of the right
kidney.

EXAM:
MRI ABDOMEN WITHOUT AND WITH CONTRAST
TECHNIQUE: Multiplanar multisequence MR imaging of the abdomen was performed
both before and after the administration of intravenous contrast.
CONTRAST:  20mL MULTIHANCE GADOBENATE DIMEGLUMINE 529 MG/ML IV SOLN

[Series 3: DWI b500 · axial · 6.0mm · 1.64mm/px · z∈[-137,+167]mm · 4 of 80 slices shown]
[im 1/80]
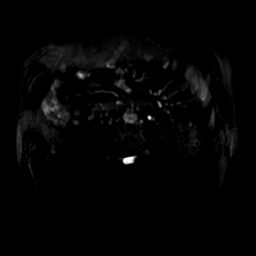
[im 27/80]
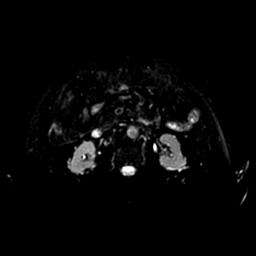
[im 53/80]
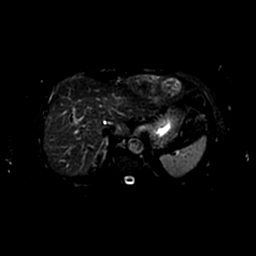
[im 80/80]
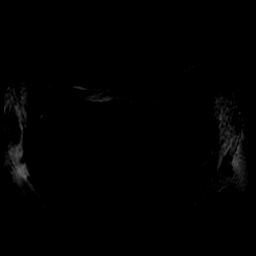

[Series 4: T2 fat-sat · axial · 5.0mm · 0.86mm/px · z∈[-148,+137]mm · 2 of 58 slices shown]
[im 1/58]
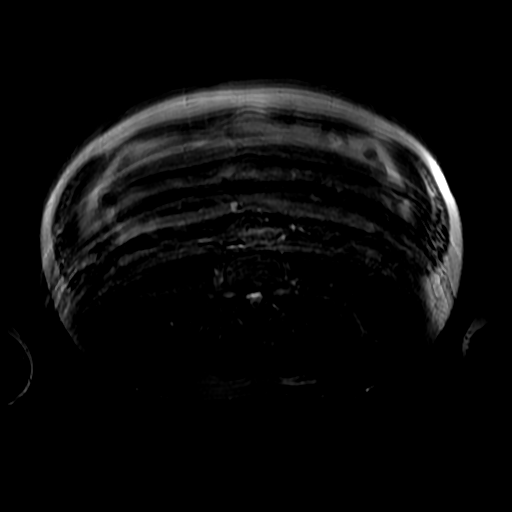
[im 58/58]
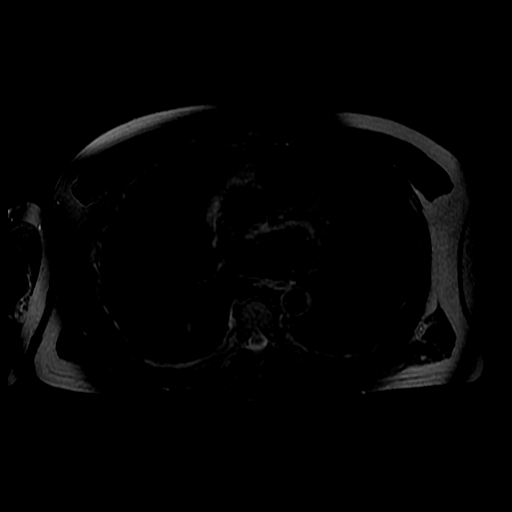

[Series 5: ax dualecho · axial · 5.0mm · 0.78mm/px · z∈[-104,+131]mm · 3 of 96 slices shown]
[im 1/96]
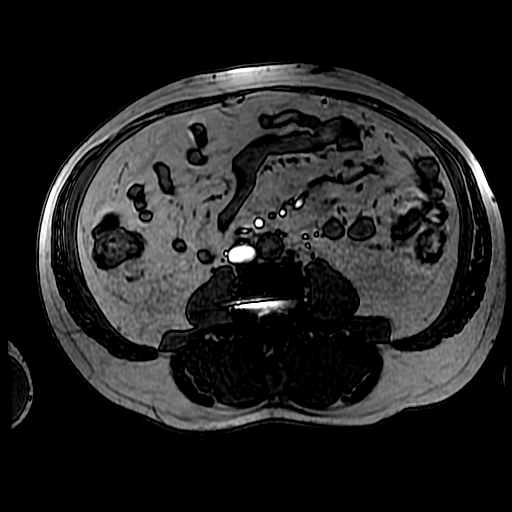
[im 48/96]
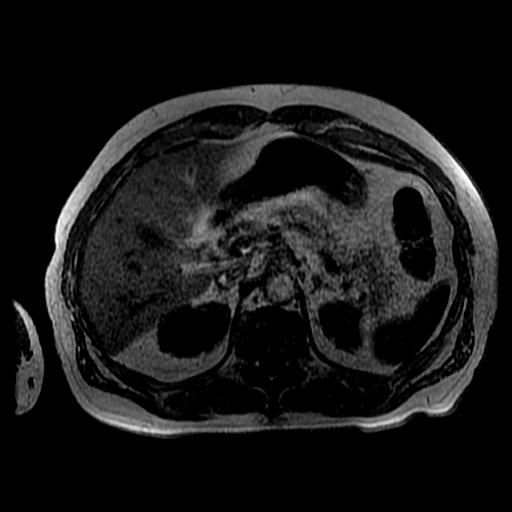
[im 96/96]
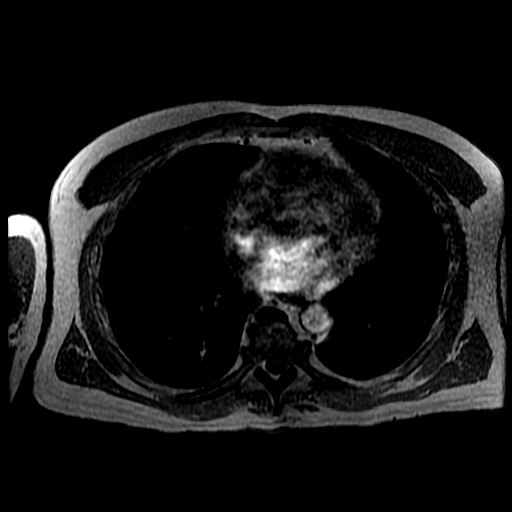

[Series 6: T2 · axial · 5.0mm · 0.78mm/px · z∈[-104,+131]mm · 2 of 48 slices shown (1 of 2)]
[im 1/48]
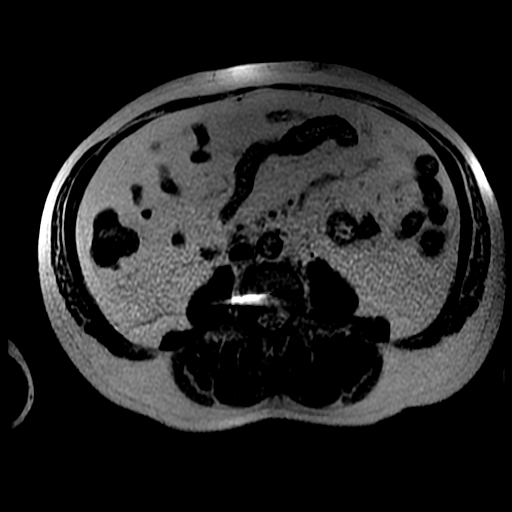
[im 48/48]
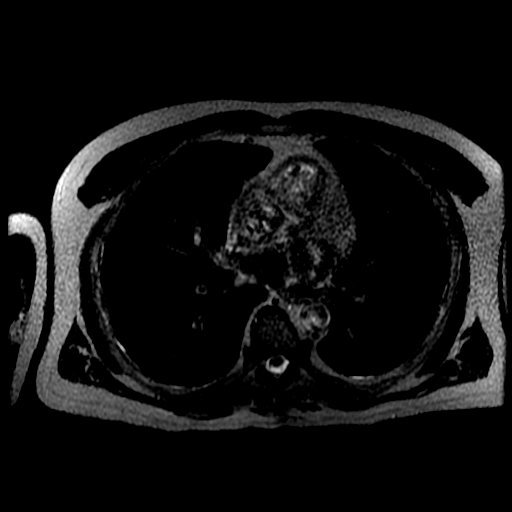

[Series 7: T2 · coronal · 5.0mm · 0.78mm/px · 1 of 44 slices shown (2 of 2)]
[im 1/44]
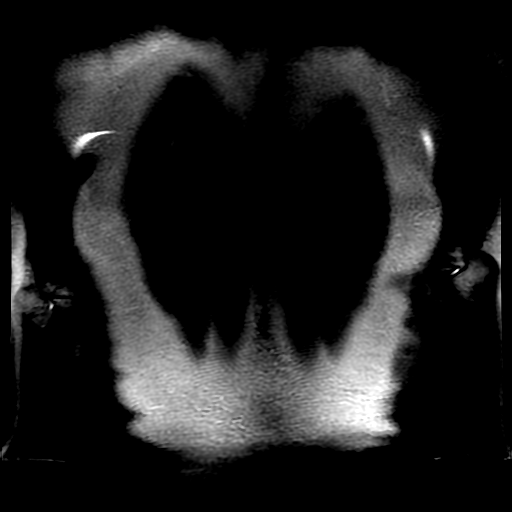

[Series 8: bSSFP · axial · 5.0mm · 0.78mm/px · z∈[-104,+131]mm · 2 of 48 slices shown]
[im 1/48]
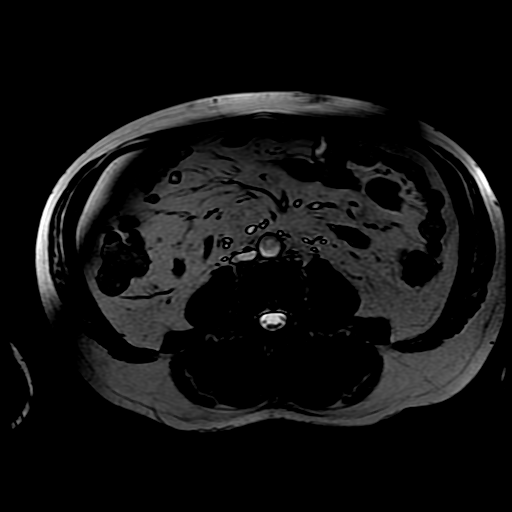
[im 48/48]
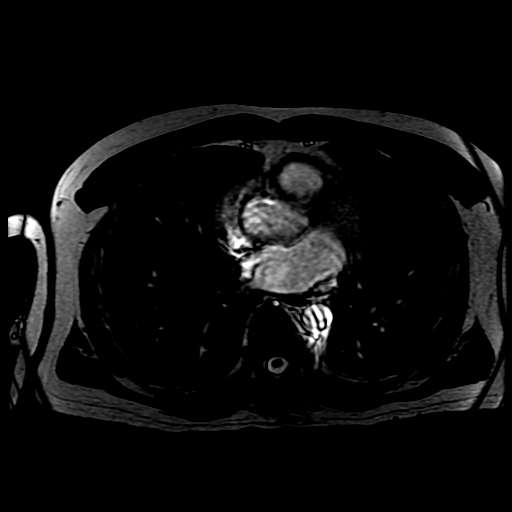

[Series 300: DWI · axial · 6.0mm · 1.64mm/px · 1 of 40 slices shown]
[im 1/40]
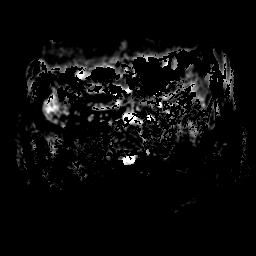

[Series 900: T1 dynamic · axial · 5.0mm · 0.78mm/px · z∈[-117,+121]mm · 3 of 96 slices shown (1 of 3)]
[im 1/96]
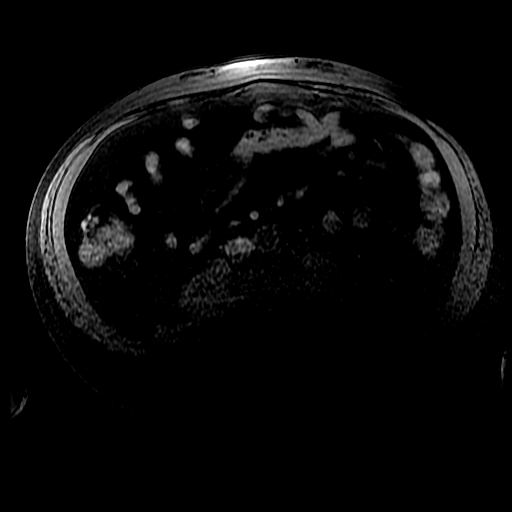
[im 48/96]
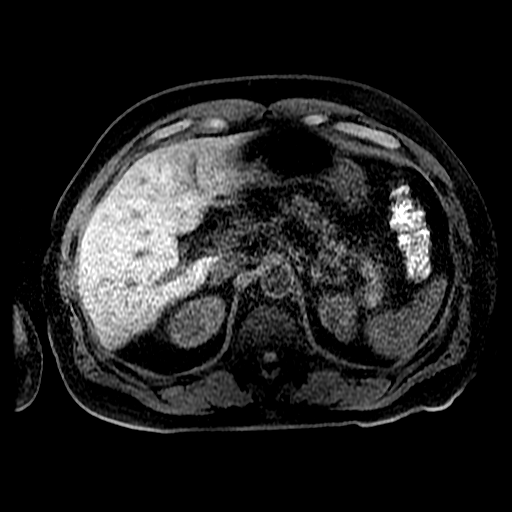
[im 96/96]
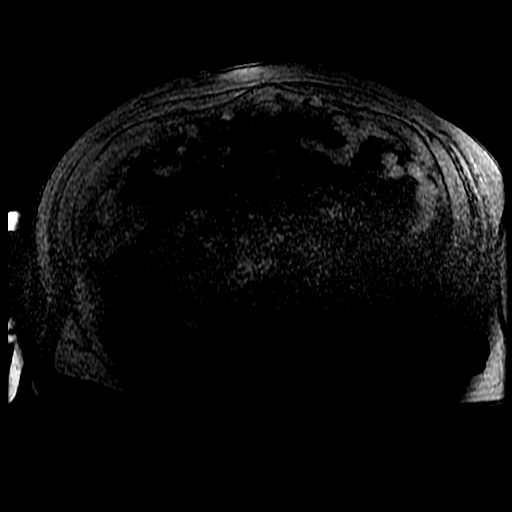

[Series 901: T1 dynamic · axial · 5.0mm · 0.78mm/px · z∈[-117,+121]mm · 3 of 96 slices shown (2 of 3)]
[im 1/96]
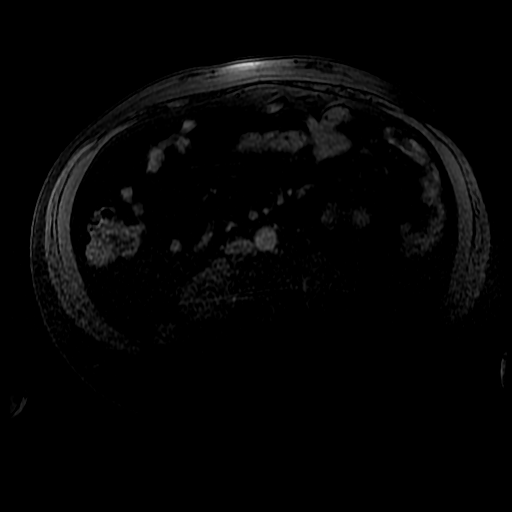
[im 48/96]
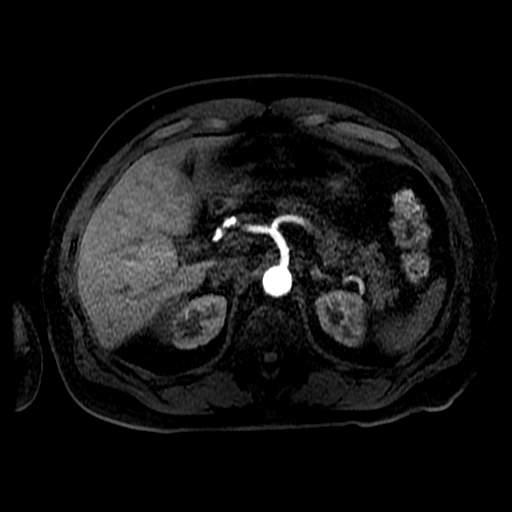
[im 96/96]
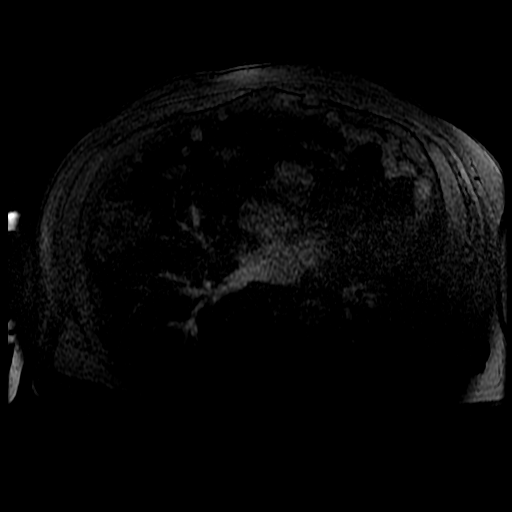

[Series 902: T1 dynamic · axial · 5.0mm · 0.78mm/px · 1 of 96 slices shown (3 of 3)]
[im 1/96]
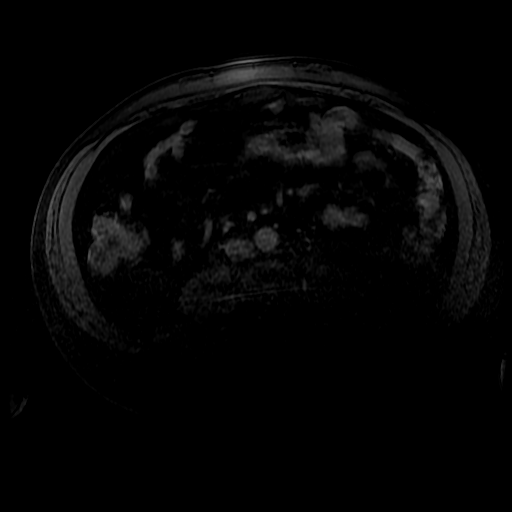

[22 of 48 positions shown; findings below may reference images not displayed]

FINDINGS: Lower chest: Pleural-based right lower lobe nodule approximately
cm in long axis, image 23/900, similar to prior CT abdomen dated
04/06/2011. There are adjacent pleural calcifications.

Hepatobiliary: 5 mm T2 hyperintense lesion anteriorly along the
right hepatic lobe, image [DATE], not appreciably changed. Gallbladder
unremarkable.

Pancreas:  Unremarkable

Spleen:  Unremarkable

Adrenals/Urinary Tract: Bosniak category 3 complex cyst of the right
kidney upper pole measures 4.1 by 3.4 cm on image 58/905, formerly
3.8 by 3.3 cm. Slightly thickened enhancing septation along the
Division between the higher signal anterior component in the lower
signal posterior component.

Essentially stable 2.1 by 1.4 cm Bosniak category 2 F cyst of the
left kidney upper pole with linear internal septations, merit
surveillance.

Stomach/Bowel: Unremarkable

Vascular/Lymphatic:  Unremarkable

Other:  No supplemental non-categorized findings.

Musculoskeletal: Lower lumbar spondylosis and degenerative disc
disease.
IMPRESSION: 1. Mild enlargement of the Bosniak category 3 cyst of the right
kidney upper pole, currently measuring 4.1 by 3.4 cm. This lesion is
indeterminate for malignancy. This there is a somewhat thickened
septation separating the higher signal intensity anterior component
from the lower signal intensity posterior component.
2. Stable Bosniak category 2F cyst of the left kidney upper pole,
essentially not appreciably changed from 04/06/2011 and accordingly
this is highly likely to be benign. We have established 5 years of
stability and us no further workup is required of this left kidney
lesion.
3. Pleural-based right lower lobe nodule, no change from 1263, with
adjacent pleural calcification. This is most compatible with benign
calcified pleural plaque.
4. Lumbar spondylosis and degenerative disc disease.

## 2018-01-29 ENCOUNTER — Telehealth: Payer: Self-pay | Admitting: Pulmonary Disease

## 2018-01-29 DIAGNOSIS — I10 Essential (primary) hypertension: Secondary | ICD-10-CM

## 2018-01-29 DIAGNOSIS — N183 Chronic kidney disease, stage 3 unspecified: Secondary | ICD-10-CM

## 2018-01-29 DIAGNOSIS — Z794 Long term (current) use of insulin: Secondary | ICD-10-CM

## 2018-01-29 DIAGNOSIS — E1122 Type 2 diabetes mellitus with diabetic chronic kidney disease: Secondary | ICD-10-CM

## 2018-01-29 DIAGNOSIS — E782 Mixed hyperlipidemia: Secondary | ICD-10-CM

## 2018-01-29 NOTE — Telephone Encounter (Signed)
Called and spoke with patient he stated that he has an appointment on 02/04/18 and he would like to come in tomorrow for the blood work before the appointment.    SN please advise, thank you.

## 2018-01-29 NOTE — Telephone Encounter (Signed)
Per SN- ok for BMP, A1C, CBC, and TSH.  Labs ordered. Patient notified that labs were placed and he can come by the [facility] office and have blood drawn, or our new location.  Patient stated that he was going to go by [facility] to have labs, and is aware of our new location on [place].  Nothing further at this time.

## 2018-01-30 ENCOUNTER — Other Ambulatory Visit (INDEPENDENT_AMBULATORY_CARE_PROVIDER_SITE_OTHER): Payer: PPO

## 2018-01-30 DIAGNOSIS — I10 Essential (primary) hypertension: Secondary | ICD-10-CM | POA: Diagnosis not present

## 2018-01-30 DIAGNOSIS — Z794 Long term (current) use of insulin: Secondary | ICD-10-CM

## 2018-01-30 DIAGNOSIS — N183 Chronic kidney disease, stage 3 unspecified: Secondary | ICD-10-CM

## 2018-01-30 DIAGNOSIS — E1122 Type 2 diabetes mellitus with diabetic chronic kidney disease: Secondary | ICD-10-CM

## 2018-01-30 LAB — BASIC METABOLIC PANEL
BUN: 18 mg/dL (ref 6–23)
CHLORIDE: 103 meq/L (ref 96–112)
CO2: 30 meq/L (ref 19–32)
Calcium: 9.7 mg/dL (ref 8.4–10.5)
Creatinine, Ser: 2.05 mg/dL — ABNORMAL HIGH (ref 0.40–1.50)
GFR: 40.73 mL/min — ABNORMAL LOW (ref >60.00)
Glucose, Bld: 87 mg/dL (ref 70–99)
POTASSIUM: 4.3 meq/L (ref 3.5–5.1)
Sodium: 140 mEq/L (ref 135–145)

## 2018-01-30 LAB — TSH: TSH: 2.62 u[IU]/mL (ref 0.35–4.50)

## 2018-01-30 LAB — CBC WITH DIFFERENTIAL/PLATELET
BASOS ABS: 0 10*3/uL (ref 0.0–0.1)
Basophils Relative: 0.6 % (ref 0.0–3.0)
EOS PCT: 3.2 % (ref 0.0–5.0)
Eosinophils Absolute: 0.2 10*3/uL (ref 0.0–0.7)
HCT: 42.3 % (ref 39.0–52.0)
HEMOGLOBIN: 14.6 g/dL (ref 13.0–17.0)
Lymphocytes Relative: 21.3 % (ref 12.0–46.0)
Lymphs Abs: 1.6 10*3/uL (ref 0.7–4.0)
MCHC: 34.4 g/dL (ref 30.0–36.0)
MCV: 93 fl (ref 78.0–100.0)
MONOS PCT: 9.3 % (ref 3.0–12.0)
Monocytes Absolute: 0.7 10*3/uL (ref 0.1–1.0)
Neutro Abs: 5.1 10*3/uL (ref 1.4–7.7)
Neutrophils Relative %: 65.6 % (ref 43.0–77.0)
Platelets: 153 10*3/uL (ref 150.0–400.0)
RBC: 4.55 Mil/uL (ref 4.22–5.81)
RDW: 13.9 % (ref 11.5–15.5)
WBC: 7.7 10*3/uL (ref 4.0–10.5)

## 2018-01-30 LAB — HEMOGLOBIN A1C: HEMOGLOBIN A1C: 7.3 % — AB (ref 4.6–6.5)

## 2018-01-31 ENCOUNTER — Ambulatory Visit (INDEPENDENT_AMBULATORY_CARE_PROVIDER_SITE_OTHER): Payer: PPO | Admitting: Otolaryngology

## 2018-01-31 DIAGNOSIS — J310 Chronic rhinitis: Secondary | ICD-10-CM | POA: Diagnosis not present

## 2018-02-04 ENCOUNTER — Ambulatory Visit (INDEPENDENT_AMBULATORY_CARE_PROVIDER_SITE_OTHER): Payer: PPO

## 2018-02-04 ENCOUNTER — Encounter: Payer: Self-pay | Admitting: Pulmonary Disease

## 2018-02-04 ENCOUNTER — Ambulatory Visit: Payer: PPO | Admitting: Pulmonary Disease

## 2018-02-04 VITALS — BP 124/70 | HR 65 | Temp 97.6°F | Ht 73.5 in | Wt 231.0 lb

## 2018-02-04 DIAGNOSIS — M150 Primary generalized (osteo)arthritis: Secondary | ICD-10-CM | POA: Diagnosis not present

## 2018-02-04 DIAGNOSIS — D49511 Neoplasm of unspecified behavior of right kidney: Secondary | ICD-10-CM

## 2018-02-04 DIAGNOSIS — N401 Enlarged prostate with lower urinary tract symptoms: Secondary | ICD-10-CM

## 2018-02-04 DIAGNOSIS — Z0000 Encounter for general adult medical examination without abnormal findings: Secondary | ICD-10-CM

## 2018-02-04 DIAGNOSIS — Z23 Encounter for immunization: Secondary | ICD-10-CM | POA: Diagnosis not present

## 2018-02-04 DIAGNOSIS — M159 Polyosteoarthritis, unspecified: Secondary | ICD-10-CM

## 2018-02-04 DIAGNOSIS — Z794 Long term (current) use of insulin: Secondary | ICD-10-CM

## 2018-02-04 DIAGNOSIS — Z86718 Personal history of other venous thrombosis and embolism: Secondary | ICD-10-CM | POA: Diagnosis not present

## 2018-02-04 DIAGNOSIS — J948 Other specified pleural conditions: Secondary | ICD-10-CM | POA: Diagnosis not present

## 2018-02-04 DIAGNOSIS — R911 Solitary pulmonary nodule: Secondary | ICD-10-CM

## 2018-02-04 DIAGNOSIS — I10 Essential (primary) hypertension: Secondary | ICD-10-CM

## 2018-02-04 DIAGNOSIS — E782 Mixed hyperlipidemia: Secondary | ICD-10-CM | POA: Diagnosis not present

## 2018-02-04 DIAGNOSIS — N138 Other obstructive and reflux uropathy: Secondary | ICD-10-CM | POA: Diagnosis not present

## 2018-02-04 DIAGNOSIS — Z905 Acquired absence of kidney: Secondary | ICD-10-CM | POA: Diagnosis not present

## 2018-02-04 DIAGNOSIS — N183 Chronic kidney disease, stage 3 (moderate): Secondary | ICD-10-CM

## 2018-02-04 DIAGNOSIS — E1122 Type 2 diabetes mellitus with diabetic chronic kidney disease: Secondary | ICD-10-CM

## 2018-02-04 NOTE — Progress Notes (Signed)
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 Medications as of 02/04/2018  Medication Sig  . allopurinol (ZYLOPRIM) 300 MG tablet TAKE 1 TABLET BY MOUTH ONCE DAILY  . amLODipine (NORVASC) 10 MG tablet TAKE 1 TABLET EVERY MORNING  . BD VEO INSULIN SYR ULTRAFINE 31G X 15/64" 1 ML MISC USE TWICE A DAY AS DIRECTED  . clobetasol (TEMOVATE) 0.05 % external solution Apply 1 application 2 (two) times a week topically. To scalp  . [facility] D3 5000 units capsule TAKE 1 CAPSULE BY MOUTH DAILY  . diphenhydrAMINE (BENADRYL) 25 MG tablet Take 25 mg by mouth daily as needed for allergies.  . fluticasone (CUTIVATE) 0.05 % cream Apply 1 application 2 (two) times a week topically. To face for itching and flaking  . fluticasone (FLONASE) 50 MCG/ACT nasal spray Place 2 sprays daily as needed into both nostrils for allergies or rhinitis.  Marland Kitchen gabapentin (NEURONTIN) 600 MG tablet Take 600-1,200 mg by mouth See admin instructions. Take 1200 mg in the morning and 600 mg at night  . metoprolol (LOPRESSOR) 50 MG tablet TAKE 1 TABLET TWICE DAILY  . NOVOLIN 70/30 (70-30) 100 UNIT/ML injection INJECT 40 UNITS INTO THE SKIN 2 TIMES A DAY BEFORE MEAL INJECT 40 UNITS BREAKFAST& 30 UNITS SUPPER (Patient taking differently: INJECT 40 UNITS AT BREAKFAST & 30 UNITS AT SUPPER)  . ONE TOUCH ULTRA TEST test strip USE TO TEST BLOOD SUGAR TWICE DAILY.  Marland Kitchen ONETOUCH DELICA LANCETS 50K MISC USE TO TEST BLOOD SUGAR TWICE DAILY.  Marland Kitchen Propylene Glycol (SYSTANE BALANCE OP) Apply 1 drop as needed to eye (irritation).  . Rivaroxaban (XARELTO) 15 MG  TABS tablet Take 15 mg by mouth daily.   . rosuvastatin (CRESTOR) 20 MG tablet Take 1 tablet (20 mg total) by mouth daily.  . [DISCONTINUED] simvastatin (ZOCOR) 40 MG tablet Takes 1/2 tablet daily bedtime   No facility-administered encounter medications on file as of 02/04/2018.     Allergies  Allergen Reactions  . Ace Inhibitors Swelling    REACTION: angio edema (swelling of lips)  . Metformin And Related     Itching in higher doses     Immunization History  Administered Date(s) Administered  . Influenza Split 11/27/2011, 12/30/2012, 01/01/2014, 03/13/2017  . Influenza Whole 01/27/2009, 11/30/2009, 11/17/2010  . Influenza, High Dose Seasonal PF 12/21/2015, 01/04/2018  . Pneumococcal Conjugate-13 02/04/2018  . Pneumococcal Polysaccharide-23 12/02/2008    Current Medications, Allergies, Past Medical History, Past Surgical History, Family History, and Social History were reviewed in [employer] record.  Review of Systems        See HPI - all other systems neg except as noted...   The patient complains of dyspnea on exertion.  The patient denies anorexia, fever, weight loss, weight gain, vision loss, decreased hearing, hoarseness, chest pain, syncope, peripheral edema, prolonged cough, headaches, hemoptysis, abdominal pain, melena, hematochezia, severe indigestion/heartburn, hematuria, incontinence, muscle weakness, suspicious skin lesions, transient blindness, difficulty walking, depression, unusual weight change, abnormal bleeding, enlarged lymph nodes, and angioedema.     Objective:   Physical Exam     WD, WN, overwt 77 y/o BM in NAD...  GENERAL:  Alert & oriented; pleasant & cooperative... HEENT:  Welcome/AT, EOM-wnl, PERRLA, EACs-wax, TMs-wnl, NOSE-clear, THROAT-clear & wnl, no angioedema. NECK:  Supple w/ full ROM; no JVD; normal carotid impulses w/o bruits; no thyromegaly or nodules palpated; no lymphadenopathy. CHEST:  Clear to P & A; without wheezes/  rales/ or rhonchi. HEART:  Regular Rhythm; without murmurs/ rubs/ or gallops. ABDOMEN:  Soft & nontender; normal bowel sounds; no organomegaly or masses detected. RECTAL:  prostate 3+, no nodules, stool heme neg... EXT:  without deformities, mild arthritic changes; no varicose veins/ venous insuffic/ or edema. NEURO:  CN's intact; motor testing normal; sensory testing normal; gait normal & balance OK. DERM:  Keloids noted...  RADIOLOGY DATA:  Reviewed in the EPIC EMR & discussed w/ the patient...  LABORATORY DATA:  Reviewed in the EPIC EMR & discussed w/ the patient...   Assessment & Plan:    Pulm Nodule, min pleural plaque w/o known asbestos exposure, mild obstructive & mod restrictive defects>  Known lesion & calcif plaque right base above diaph seen on old CTs but not readily visible on plain films; he has no real resp symptoms & we are following...    08/07/17>   [place]ine remains stable from the pulmonary standpoint;  We will refer to the [facility] to eval his olecranon bursitis;  Plan f/u 77m w/ CXR.   Medical problems are now managed by DrGolding & DrNida in [place] >>  HBP>  Controlled on BBlocker, CCB; tol well, continue same... Hx DVT>  hx single episode left leg DVT in 2007- Dx in [place] & followed there; Coumadin stopped 2014 & developed new right leg DVT=> back on Xarelto... CHOL>  On Prav40 + off Feno; FLP 11/15 was fair, now work on diet & get wt down! DM>  He reports INTOL to Metform w/ itching; prev onGlimep4mg Qam & Januvia100mg Qpm but control was worse; referred [facility] & now controlled on Novolin 70/30... Multinodular Goiter> eval & rx from [place] in [place]. GI> GERD> on PPI as needed; had f/u colon 2018 w/ several polyps reviewed... GU> BPH w/ open prostatectomy 1999 by DrDavis;  PSA remains WNL; he has remote hx kidney stone; prob of Hematuria & ?abn cytology in office; Cysto, retrogrades & bladder bxs were all neg 2/13 per DrBorden & serial MRIs showed  growing cystic lesion right kidney=> right radical nephrectomy 04/2017 DJD/ Gout/ Hx CTS>  Stable on OTC anti-inflamm as needed and Allopurinol daily... Keloid>  Aware, no change...   Patient's Medications  New Prescriptions   No medications on file  Previous Medications   ALLOPURINOL (ZYLOPRIM) 300 MG TABLET    TAKE 1 TABLET BY MOUTH ONCE DAILY   AMLODIPINE (NORVASC) 10 MG TABLET    TAKE 1 TABLET EVERY MORNING   BD VEO INSULIN SYR ULTRAFINE 31G X 15/64" 1 ML MISC    USE TWICE A DAY AS DIRECTED   CLOBETASOL (  TEMOVATE) 0.05 % EXTERNAL SOLUTION    Apply 1 application 2 (two) times a week topically. To scalp   [employer] D3 5000 UNITS CAPSULE    TAKE 1 CAPSULE BY MOUTH DAILY   DIPHENHYDRAMINE (BENADRYL) 25 MG TABLET    Take 25 mg by mouth daily as needed for allergies.   FLUTICASONE (CUTIVATE) 0.05 % CREAM    Apply 1 application 2 (two) times a week topically. To face for itching and flaking   FLUTICASONE (FLONASE) 50 MCG/ACT NASAL SPRAY    Place 2 sprays daily as needed into both nostrils for allergies or rhinitis.   GABAPENTIN (NEURONTIN) 600 MG TABLET    Take 600-1,200 mg by mouth See admin instructions. Take 1200 mg in the morning and 600 mg at night   METOPROLOL (LOPRESSOR) 50 MG TABLET    TAKE 1 TABLET TWICE DAILY   NOVOLIN 70/30 (70-30) 100 UNIT/ML INJECTION    INJECT 40 UNITS INTO THE SKIN 2 TIMES A DAY BEFORE MEAL INJECT 40 UNITS BREAKFAST& 30 UNITS SUPPER   ONE TOUCH ULTRA TEST TEST STRIP    USE TO TEST BLOOD SUGAR TWICE DAILY.   ONETOUCH DELICA LANCETS 11H MISC    USE TO TEST BLOOD SUGAR TWICE DAILY.   PROPYLENE GLYCOL (SYSTANE BALANCE OP)    Apply 1 drop as needed to eye (irritation).   RIVAROXABAN (XARELTO) 15 MG TABS TABLET    Take 15 mg by mouth daily.    ROSUVASTATIN (CRESTOR) 20 MG TABLET    Take 1 tablet (20 mg total) by mouth daily.  Modified Medications   No medications on file  Discontinued Medications   No medications on file

## 2018-02-04 NOTE — Patient Instructions (Addendum)
Today we updated your med list in our EPIC system...    Continue your current medications the same...  We gave you the last of the pneumonia shots-- PREVNAR-13 (the one & done vaccination)...  Delorise Shiner & Darryll Capers -- It has been my honor to have been one of your doctors over these many years!!!    Wishing you all a happy Thanksgiving & Merry Christmas w/ my best wishes for good health & much happiness in the years to come.Marland KitchenMarland Kitchen

## 2018-02-06 ENCOUNTER — Ambulatory Visit: Payer: PPO | Admitting: Pulmonary Disease

## 2018-02-13 ENCOUNTER — Ambulatory Visit: Payer: PPO | Admitting: Family Medicine

## 2018-02-13 DIAGNOSIS — J329 Chronic sinusitis, unspecified: Secondary | ICD-10-CM | POA: Diagnosis not present

## 2018-02-13 DIAGNOSIS — E1140 Type 2 diabetes mellitus with diabetic neuropathy, unspecified: Secondary | ICD-10-CM | POA: Diagnosis not present

## 2018-02-13 DIAGNOSIS — Z6831 Body mass index (BMI) 31.0-31.9, adult: Secondary | ICD-10-CM | POA: Diagnosis not present

## 2018-02-13 DIAGNOSIS — M1991 Primary osteoarthritis, unspecified site: Secondary | ICD-10-CM | POA: Diagnosis not present

## 2018-02-13 DIAGNOSIS — E6609 Other obesity due to excess calories: Secondary | ICD-10-CM | POA: Diagnosis not present

## 2018-02-21 DIAGNOSIS — Z794 Long term (current) use of insulin: Secondary | ICD-10-CM | POA: Diagnosis not present

## 2018-02-21 DIAGNOSIS — N183 Chronic kidney disease, stage 3 (moderate): Secondary | ICD-10-CM | POA: Diagnosis not present

## 2018-02-21 DIAGNOSIS — E1122 Type 2 diabetes mellitus with diabetic chronic kidney disease: Secondary | ICD-10-CM | POA: Diagnosis not present

## 2018-02-21 LAB — COMPLETE METABOLIC PANEL WITH GFR
AG RATIO: 1.4 (calc) (ref 1.0–2.5)
ALKALINE PHOSPHATASE (APISO): 72 U/L (ref 40–115)
ALT: 19 U/L (ref 9–46)
AST: 24 U/L (ref 10–35)
Albumin: 4.2 g/dL (ref 3.6–5.1)
BUN/Creatinine Ratio: 9 (calc) (ref 6–22)
BUN: 20 mg/dL (ref 7–25)
CALCIUM: 9.6 mg/dL (ref 8.6–10.3)
CO2: 33 mmol/L — ABNORMAL HIGH (ref 20–32)
Chloride: 105 mmol/L (ref 98–110)
Creat: 2.17 mg/dL — ABNORMAL HIGH (ref 0.70–1.18)
GFR, EST NON AFRICAN AMERICAN: 29 mL/min/{1.73_m2} — AB (ref >=60)
GFR, Est African American: 33 mL/min/{1.73_m2} — ABNORMAL LOW (ref >=60)
GLOBULIN: 2.9 g/dL (ref 1.9–3.7)
Glucose, Bld: 153 mg/dL — ABNORMAL HIGH (ref 65–139)
Potassium: 5.4 mmol/L — ABNORMAL HIGH (ref 3.5–5.3)
SODIUM: 143 mmol/L (ref 135–146)
TOTAL PROTEIN: 7.1 g/dL (ref 6.1–8.1)
Total Bilirubin: 0.6 mg/dL (ref 0.2–1.2)

## 2018-02-21 LAB — HEMOGLOBIN A1C
EAG (MMOL/L): 9 (calc)
Hgb A1c MFr Bld: 7.3 % of total Hgb — ABNORMAL HIGH (ref <5.7)
Mean Plasma Glucose: 163 (calc)

## 2018-02-25 ENCOUNTER — Ambulatory Visit (INDEPENDENT_AMBULATORY_CARE_PROVIDER_SITE_OTHER): Payer: PPO | Admitting: "Endocrinology

## 2018-02-25 ENCOUNTER — Encounter: Payer: Self-pay | Admitting: "Endocrinology

## 2018-02-25 VITALS — BP 136/69 | HR 56 | Ht 73.5 in | Wt 229.0 lb

## 2018-02-25 DIAGNOSIS — E1122 Type 2 diabetes mellitus with diabetic chronic kidney disease: Secondary | ICD-10-CM | POA: Diagnosis not present

## 2018-02-25 DIAGNOSIS — Z794 Long term (current) use of insulin: Secondary | ICD-10-CM | POA: Diagnosis not present

## 2018-02-25 DIAGNOSIS — I10 Essential (primary) hypertension: Secondary | ICD-10-CM | POA: Diagnosis not present

## 2018-02-25 DIAGNOSIS — N183 Chronic kidney disease, stage 3 (moderate): Secondary | ICD-10-CM

## 2018-02-25 DIAGNOSIS — E782 Mixed hyperlipidemia: Secondary | ICD-10-CM

## 2018-02-25 NOTE — Patient Instructions (Signed)

## 2018-02-25 NOTE — Progress Notes (Signed)
Endocrinology follow-up note   Subjective:    Patient ID: Hector Rose, male    DOB: Nov 05, 1940,    Past Medical History:  Diagnosis Date  . Asthma 04-11-11   AS CHILD ONLY  . BPH (benign prostatic hypertrophy) with urinary obstruction   . Carpal tunnel syndrome    patient denies at 05/14/17 preop visit   . Difficult intubation 04-11-11   08-11-97 some issues with intubation/record with chart  . DJD (degenerative joint disease)   . DM (diabetes mellitus) ([place]) 04-11-11   tyep II   . DVT femoral (deep venous thrombosis) with thrombophlebitis ([place]) 04-11-11   ?'09-tx. on Xarelton x 3-4 years   . GERD (gastroesophageal reflux disease) 04-11-11   mild, no med in 1 month  . Gout 04-11-11    ankle 2 yrs ago  . Hemorrhoids   . Hypercholesteremia   . Hypertension   . Internal hemorrhoids   . Keloid 04-11-11   multiple-arms,back, chest  . Pulmonary nodule 04-11-11   "PT. NOT AWARE" -denies problems with breathing  . Renal calculus   . Thyroid disease    hypothyroid   Past Surgical History:  Procedure Laterality Date  . cataract surgery  12/2013  . COLONOSCOPY WITH PROPOFOL N/A 02/05/2017   Procedure: COLONOSCOPY WITH PROPOFOL;  Surgeon: Ladene Artist, MD;  Location: [facility];  Service: Endoscopy;  Laterality: N/A;  . COSMETIC SURGERY  2000   Keloid injections  . CYSTOSCOPY WITH BIOPSY  04/14/2011   Procedure: CYSTOSCOPY WITH BIOPSY;  Surgeon: Dutch Gray, MD;  Location: [facility];  Service: Urology;  Laterality: N/A;  Bladder Biopsies   . CYSTOSCOPY/RETROGRADE/URETEROSCOPY  04/14/2011   Procedure: CYSTOSCOPY/RETROGRADE/URETEROSCOPY;  Surgeon: Dutch Gray, MD;  Location: [facility];  Service: Urology;  Laterality: Bilateral;  (Bil) RPG   . LAPAROSCOPIC NEPHRECTOMY Right 05/21/2017   Procedure: LAPAROSCOPIC RADICAL NEPHRECTOMY;  Surgeon: Raynelle Bring, MD;  Location: [facility];  Service: Urology;  Laterality: Right;  . ortho surgery on fingers  2005 & 2008   Dr. Lorelle Formosa finger release   . PROSTATECTOMY  1999   Dr. Rosana Hoes   Social History   Socioeconomic History  . Marital status: Married    Spouse name: Not on file  . Number of children: 2  . Years of education: Not on file  . Highest education level: Not on file  Occupational History  . Occupation: retired from [employer]  . Financial resource strain: Not on file  . Food insecurity:    Worry: Not on file    Inability: Not on file  . Transportation needs:    Medical: Not on file    Non-medical: Not on file  Tobacco Use  . Smoking status: Never Smoker  . Smokeless tobacco: Never Used  Substance and Sexual Activity  . Alcohol use: No    Alcohol/week: 0.0 standard drinks  . Drug use: No  . Sexual activity: Yes  Lifestyle  . Physical activity:    Days per week: Not on file    Minutes per session: Not on file  . Stress: Not on file  Relationships  . Social connections:    Talks on phone: Not on file    Gets together: Not on file    Attends religious service: Not on file    Active member of club or organization: Not on file    Attends meetings of clubs or organizations: Not on file    Relationship status: Not on file  Other Topics Concern  .  Not on file  Social History Narrative  . Not on file   Outpatient Encounter Medications as of 02/25/2018  Medication Sig  . allopurinol (ZYLOPRIM) 300 MG tablet TAKE 1 TABLET BY MOUTH ONCE DAILY  . amLODipine (NORVASC) 10 MG tablet TAKE 1 TABLET EVERY MORNING  . BD VEO INSULIN SYR ULTRAFINE 31G X 15/64" 1 ML MISC USE TWICE A DAY AS DIRECTED  . [facility] D3 5000 units capsule TAKE 1 CAPSULE BY MOUTH DAILY  . diphenhydrAMINE (BENADRYL) 25 MG tablet Take 25 mg by mouth daily as needed for allergies.  . fluticasone (CUTIVATE) 0.05 % cream Apply 1 application 2 (two) times a week topically. To face for itching and flaking  . fluticasone (FLONASE) 50 MCG/ACT nasal spray Place 2 sprays daily as needed into both nostrils for allergies or rhinitis.  Marland Kitchen gabapentin  (NEURONTIN) 600 MG tablet Take 600-1,200 mg by mouth See admin instructions. Take 1200 mg in the morning and 600 mg at night  . metoprolol (LOPRESSOR) 50 MG tablet TAKE 1 TABLET TWICE DAILY  . NOVOLIN 70/30 (70-30) 100 UNIT/ML injection INJECT 40 UNITS INTO THE SKIN 2 TIMES A DAY BEFORE MEAL INJECT 40 UNITS BREAKFAST& 30 UNITS SUPPER (Patient taking differently: INJECT 40 UNITS AT BREAKFAST & 30 UNITS AT SUPPER)  . ONE TOUCH ULTRA TEST test strip USE TO TEST BLOOD SUGAR TWICE DAILY.  Marland Kitchen ONETOUCH DELICA LANCETS 19Q MISC USE TO TEST BLOOD SUGAR TWICE DAILY.  Marland Kitchen Propylene Glycol (SYSTANE BALANCE OP) Apply 1 drop as needed to eye (irritation).  . Rivaroxaban (XARELTO) 15 MG TABS tablet Take 15 mg by mouth daily.   . rosuvastatin (CRESTOR) 20 MG tablet Take 1 tablet (20 mg total) by mouth daily.  . [DISCONTINUED] clobetasol (TEMOVATE) 0.05 % external solution Apply 1 application 2 (two) times a week topically. To scalp  . [DISCONTINUED] simvastatin (ZOCOR) 40 MG tablet Takes 1/2 tablet daily bedtime   No facility-administered encounter medications on file as of 02/25/2018.    ALLERGIES: Allergies  Allergen Reactions  . Ace Inhibitors Swelling    REACTION: angio edema (swelling of lips)  . Metformin And Related     Itching in higher doses    VACCINATION STATUS: Immunization History  Administered Date(s) Administered  . Influenza Split 11/27/2011, 12/30/2012, 01/01/2014, 03/13/2017  . Influenza Whole 01/27/2009, 11/30/2009, 11/17/2010  . Influenza, High Dose Seasonal PF 12/21/2015, 01/04/2018  . Pneumococcal Conjugate-13 02/04/2018  . Pneumococcal Polysaccharide-23 12/02/2008    Diabetes  He presents for his follow-up diabetic visit. He has type 2 diabetes mellitus. Onset time: Diagnosed approximately  at age 55. His disease course has been stable. Pertinent negatives for hypoglycemia include no confusion, headaches, pallor or seizures. Pertinent negatives for diabetes include no chest pain,  no fatigue, no polydipsia, no polyphagia, no polyuria and no weakness. Symptoms are stable. Risk factors for coronary artery disease include diabetes mellitus, dyslipidemia, male sex, hypertension and sedentary lifestyle. Current diabetic treatment includes insulin injections. His weight is fluctuating minimally. He is following a generally unhealthy diet. Meal planning includes ADA exchanges. He participates in exercise intermittently. His breakfast blood glucose range is generally 130-140 mg/dl. His lunch blood glucose range is generally 140-180 mg/dl. His dinner blood glucose range is generally 140-180 mg/dl. His overall blood glucose range is 140-180 mg/dl.  Hypertension  This is a chronic problem. The current episode started more than 1 year ago. The problem is controlled. Pertinent negatives include no chest pain, headaches, neck pain, palpitations or shortness of breath. Past  treatments include calcium channel blockers.  Hyperlipidemia  This is a chronic problem. The current episode started more than 1 year ago. The problem is uncontrolled. Pertinent negatives include no chest pain, myalgias or shortness of breath. Current antihyperlipidemic treatment includes bile acid squestrants. Risk factors for coronary artery disease include dyslipidemia and diabetes mellitus.     Review of Systems  Constitutional: Negative for fatigue and unexpected weight change.  HENT: Negative for dental problem, mouth sores and trouble swallowing.   Eyes: Negative for visual disturbance.  Respiratory: Negative for cough, choking, chest tightness, shortness of breath and wheezing.   Cardiovascular: Negative for chest pain, palpitations and leg swelling.  Gastrointestinal: Negative for abdominal distention, abdominal pain, constipation, diarrhea, nausea and vomiting.  Endocrine: Negative for polydipsia, polyphagia and polyuria.  Genitourinary: Negative for dysuria, flank pain, hematuria and urgency.  Musculoskeletal:  Negative for back pain, gait problem, joint swelling, myalgias and neck pain.  Skin: Negative for pallor, rash and wound.  Neurological: Negative for seizures, syncope, weakness, numbness and headaches.  Psychiatric/Behavioral: Negative for confusion, dysphoric mood, hallucinations and suicidal ideas.    Objective:    BP 136/69   Pulse (!) 56   Ht 6' 1.5" (1.867 m)   Wt 229 lb (103.9 kg)   BMI 29.80 kg/m   Wt Readings from Last 3 Encounters:  02/25/18 229 lb (103.9 kg)  02/04/18 231 lb (104.8 kg)  11/26/17 226 lb (102.5 kg)    Physical Exam  Constitutional: He is oriented to person, place, and time. He appears well-developed. He is cooperative.  HENT:  Head: Normocephalic and atraumatic.  Eyes: EOM are normal.  Neck: Normal range of motion. Neck supple. No tracheal deviation present. No thyromegaly present.  Cardiovascular: Normal rate. Exam reveals no gallop.  No murmur heard. Pulses:      Carotid pulses are 2+ on the right side.      Radial pulses are 2+ on the right side.       Dorsalis pedis pulses are 2+ on the right side.       Posterior tibial pulses are 2+ on the right side.  Pulmonary/Chest: Effort normal. No respiratory distress. He has no wheezes.  Abdominal: Bowel sounds are normal. He exhibits no distension. There is no hepatosplenomegaly. There is no abdominal tenderness. There is no guarding and no CVA tenderness.  Musculoskeletal:        General: No edema.     Right shoulder: He exhibits no swelling and no deformity.  Lymphadenopathy:       Head (right side): No submental and no submandibular adenopathy present.       Head (left side): No submental and no submandibular adenopathy present.       Right cervical: No superficial cervical adenopathy present.      Left cervical: No superficial cervical adenopathy present.       Right: No supraclavicular adenopathy present.       Left: No supraclavicular adenopathy present.  Neurological: He is alert and oriented  to person, place, and time. He has normal strength and normal reflexes. No cranial nerve deficit or sensory deficit. Gait normal.  Skin: Skin is warm and dry. No rash noted. No cyanosis. Nails show no clubbing.  Psychiatric: He has a normal mood and affect. His speech is normal. Judgment normal. Cognition and memory are normal.   Recent Results (from the past 2160 hour(s))  TSH     Status: None   Collection Time: 01/30/18  9:24 AM  Result  Value Ref Range   TSH 2.62 0.35 - 4.50 uIU/mL  CBC w/Diff     Status: None   Collection Time: 01/30/18  9:24 AM  Result Value Ref Range   WBC 7.7 4.0 - 10.5 K/uL   RBC 4.55 4.22 - 5.81 Mil/uL   Hemoglobin 14.6 13.0 - 17.0 g/dL   HCT 42.3 39.0 - 52.0 %   MCV 93.0 78.0 - 100.0 fl   MCHC 34.4 30.0 - 36.0 g/dL   RDW 13.9 11.5 - 15.5 %   Platelets 153.0 150.0 - 400.0 K/uL   Neutrophils Relative % 65.6 43.0 - 77.0 %   Lymphocytes Relative 21.3 12.0 - 46.0 %   Monocytes Relative 9.3 3.0 - 12.0 %   Eosinophils Relative 3.2 0.0 - 5.0 %   Basophils Relative 0.6 0.0 - 3.0 %   Neutro Abs 5.1 1.4 - 7.7 K/uL   Lymphs Abs 1.6 0.7 - 4.0 K/uL   Monocytes Absolute 0.7 0.1 - 1.0 K/uL   Eosinophils Absolute 0.2 0.0 - 0.7 K/uL   Basophils Absolute 0.0 0.0 - 0.1 K/uL  HgB A1c     Status: Abnormal   Collection Time: 01/30/18  9:24 AM  Result Value Ref Range   Hgb A1c MFr Bld 7.3 (H) 4.6 - 6.5 %    Comment: Glycemic Control Guidelines for People with Diabetes:Non Diabetic:  <6%Goal of Therapy: <7%Additional Action Suggested:  >8%   Basic Metabolic Panel (BMET)     Status: Abnormal   Collection Time: 01/30/18  9:24 AM  Result Value Ref Range   Sodium 140 135 - 145 mEq/L   Potassium 4.3 3.5 - 5.1 mEq/L   Chloride 103 96 - 112 mEq/L   CO2 30 19 - 32 mEq/L   Glucose, Bld 87 70 - 99 mg/dL   BUN 18 6 - 23 mg/dL   Creatinine, Ser 2.05 (H) 0.40 - 1.50 mg/dL   Calcium 9.7 8.4 - 10.5 mg/dL   GFR 40.73 (L) >60.00 mL/min  Hemoglobin A1c     Status: Abnormal   Collection  Time: 02/21/18  8:30 AM  Result Value Ref Range   Hgb A1c MFr Bld 7.3 (H) <5.7 % of total Hgb    Comment: For someone without known diabetes, a hemoglobin A1c value of 6.5% or greater indicates that they may have  diabetes and this should be confirmed with a follow-up  test. . For someone with known diabetes, a value <7% indicates  that their diabetes is well controlled and a value  greater than or equal to 7% indicates suboptimal  control. A1c targets should be individualized based on  duration of diabetes, age, comorbid conditions, and  other considerations. . Currently, no consensus exists regarding use of hemoglobin A1c for diagnosis of diabetes for children. .    Mean Plasma Glucose 163 (calc)   eAG (mmol/L) 9.0 (calc)  COMPLETE METABOLIC PANEL WITH GFR     Status: Abnormal   Collection Time: 02/21/18  8:30 AM  Result Value Ref Range   Glucose, Bld 153 (H) 65 - 139 mg/dL    Comment: .        Non-fasting reference interval .    BUN 20 7 - 25 mg/dL   Creat 2.17 (H) 0.70 - 1.18 mg/dL    Comment: For patients >46 years of age, the reference limit for Creatinine is approximately 13% higher for people identified as African-American. .    GFR, Est Non African American 29 (L) > OR = 60  mL/min/1.10m2   GFR, Est African American 33 (L) > OR = 60 mL/min/1.25m2   BUN/Creatinine Ratio 9 6 - 22 (calc)   Sodium 143 135 - 146 mmol/L   Potassium 5.4 (H) 3.5 - 5.3 mmol/L   Chloride 105 98 - 110 mmol/L   CO2 33 (H) 20 - 32 mmol/L   Calcium 9.6 8.6 - 10.3 mg/dL   Total Protein 7.1 6.1 - 8.1 g/dL   Albumin 4.2 3.6 - 5.1 g/dL   Globulin 2.9 1.9 - 3.7 g/dL (calc)   AG Ratio 1.4 1.0 - 2.5 (calc)   Total Bilirubin 0.6 0.2 - 1.2 mg/dL   Alkaline phosphatase (APISO) 72 40 - 115 U/L   AST 24 10 - 35 U/L   ALT 19 9 - 46 U/L   Lipid Panel     Component Value Date/Time   CHOL 222 (H) 11/20/2017 0745   TRIG 118 11/20/2017 0745   HDL 46 11/20/2017 0745   CHOLHDL 4.8 11/20/2017 0745    VLDL 28 03/17/2016 0714   LDLCALC 152 (H) 11/20/2017 0745   LDLDIRECT 147.5 06/10/2012 0930     Assessment & Plan:   1. Type 2 diabetes mellitus with stage 2 chronic kidney disease He came with fluctuating blood glucose profile, A1c of 7.3%.    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     Patient is advised to stick to a routine mealtimes to eat 3 meals  a day and avoid unnecessary snacks ( to snack only to correct hypoglycemia).  -  Suggestion is made for him to avoid simple carbohydrates  from his diet including Cakes, Sweet Desserts / Pastries, Ice Cream, Soda (diet and regular), Sweet Tea, Candies, Chips, Cookies, Store Bought Juices, Alcohol in Excess of  1-2 drinks a day, Artificial Sweeteners, and "Sugar-free" Products. This will help patient to have stable blood glucose profile and potentially avoid unintended weight gain.   -He will be continued on the same insulin regimen, advised to continue Novolin 70/30  40 units with breakfast and  30 units with supper when pre-meal blood glucose is above 70.  - He is advised to continue glucose monitoring at least 2 times daily-before breakfast and at supper and anytime as needed. - Patient is instructed to call back with extremes of blood glucose readings  less than 70 or greater than 300 mg/dl. -He is not a candidate for metformin, SGLT2 inhibitors, nor incretin therapy.  2. Mixed hyperlipidemia - His repeat lipid panel showed significant loss of control to LDL of 166.  He is tolerating Crestor better than pravastatin.  He is advised to continue Crestor 20 mg p.o. nightly.  - He is advised to continue with omega-3 fatty acids ,avoid butter and fried food and exercise regularly.  3. Essential hypertension  His blood pressure is controlled to target.  He is advised to continue amlodipine.  4) vitamin D  deficiency -He is on ongoing supplement with  vitamin D 3 5000 units daily for the next 90 days.   - Time spent with the patient: 25 min, of which >50% was spent in reviewing his blood glucose logs , discussing his hypo- and hyper-glycemic episodes, reviewing his current and  previous labs and insulin doses and developing a plan to avoid hypo- and hyper-glycemia. Please refer to Patient Instructions for Blood Glucose Monitoring and Insulin/Medications Dosing Guide"  in media tab for additional information. Delorise Shiner  [place] participated in the discussions, expressed understanding, and voiced agreement with the above plans.  All questions were answered to his satisfaction. he is encouraged to contact clinic should he have any questions or concerns prior to his return visit.   Follow up plan: Return in about 6 months (around 08/27/2018) for Follow up with Pre-visit Labs, Meter, and Logs.  Glade Lloyd, MD Phone: [phone]  Fax: [phone]  -  This note was partially dictated with voice recognition software. Similar sounding words can be transcribed inadequately or may not  be corrected upon review.  02/25/2018, 3:01 PM

## 2018-02-27 DIAGNOSIS — M25562 Pain in left knee: Secondary | ICD-10-CM | POA: Diagnosis not present

## 2018-02-27 DIAGNOSIS — M79672 Pain in left foot: Secondary | ICD-10-CM | POA: Diagnosis not present

## 2018-02-27 DIAGNOSIS — M79605 Pain in left leg: Secondary | ICD-10-CM | POA: Diagnosis not present

## 2018-02-28 DIAGNOSIS — Z6830 Body mass index (BMI) 30.0-30.9, adult: Secondary | ICD-10-CM | POA: Diagnosis not present

## 2018-02-28 DIAGNOSIS — E6609 Other obesity due to excess calories: Secondary | ICD-10-CM | POA: Diagnosis not present

## 2018-02-28 DIAGNOSIS — J069 Acute upper respiratory infection, unspecified: Secondary | ICD-10-CM | POA: Diagnosis not present

## 2018-03-01 ENCOUNTER — Telehealth: Payer: Self-pay

## 2018-03-01 NOTE — Telephone Encounter (Signed)
Pt has already been scheduled for 10am ON 03/21/2018

## 2018-03-14 ENCOUNTER — Encounter (HOSPITAL_COMMUNITY): Payer: Self-pay | Admitting: Physical Therapy

## 2018-03-14 ENCOUNTER — Other Ambulatory Visit: Payer: Self-pay

## 2018-03-14 ENCOUNTER — Ambulatory Visit (HOSPITAL_COMMUNITY): Payer: PPO | Attending: Orthopedic Surgery | Admitting: Physical Therapy

## 2018-03-14 DIAGNOSIS — M25672 Stiffness of left ankle, not elsewhere classified: Secondary | ICD-10-CM | POA: Diagnosis not present

## 2018-03-14 DIAGNOSIS — M25671 Stiffness of right ankle, not elsewhere classified: Secondary | ICD-10-CM | POA: Insufficient documentation

## 2018-03-14 DIAGNOSIS — M6281 Muscle weakness (generalized): Secondary | ICD-10-CM

## 2018-03-14 DIAGNOSIS — R2689 Other abnormalities of gait and mobility: Secondary | ICD-10-CM | POA: Diagnosis not present

## 2018-03-14 NOTE — Patient Instructions (Signed)
  Calf Stretch    With towel around forefoot, keep knee straight and pull back on towel until a stretch is felt in the calf. Hold __30__ seconds. Repeat _3___ times. Do __2__ sessions per day.  Copyright  VHI. All rights reserved.
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
## 2018-03-18 ENCOUNTER — Encounter (HOSPITAL_COMMUNITY): Payer: Self-pay

## 2018-03-18 ENCOUNTER — Ambulatory Visit (HOSPITAL_COMMUNITY): Payer: PPO

## 2018-03-18 DIAGNOSIS — M25672 Stiffness of left ankle, not elsewhere classified: Secondary | ICD-10-CM | POA: Diagnosis not present

## 2018-03-18 DIAGNOSIS — M25671 Stiffness of right ankle, not elsewhere classified: Secondary | ICD-10-CM

## 2018-03-18 DIAGNOSIS — R2689 Other abnormalities of gait and mobility: Secondary | ICD-10-CM

## 2018-03-18 DIAGNOSIS — M6281 Muscle weakness (generalized): Secondary | ICD-10-CM

## 2018-03-18 NOTE — Therapy (Signed)
[place], [place], [address] Phone: [phone]   Fax:  [phone]  Physical Therapy Treatment  Patient Details  Name: Hector Rose MRN: [id number] Date of Birth: September 10, 1940 Referring Provider (PT): Ardeen Jourdain, [place]   Encounter Date: 03/18/2018  PT End of Session - 03/18/18 1156    Visit Number  2    Number of Visits  9    Date for PT Re-Evaluation  04/11/18    Authorization Type  [employer] - Visits based on Medical Necessity    Authorization Time Period  03/14/18 - 04/11/18    Authorization - Visit Number  2    Authorization - Number of Visits  10    PT Start Time  1115    PT Stop Time  1155    PT Time Calculation (min)  40 min    Activity Tolerance  Patient tolerated treatment well    Behavior During Therapy  [facility] for tasks assessed/performed       Past Medical History:  Diagnosis Date  . Asthma 04-11-11   AS CHILD ONLY  . BPH (benign prostatic hypertrophy) with urinary obstruction   . Carpal tunnel syndrome    patient denies at 05/14/17 preop visit   . Difficult intubation 04-11-11   08-11-97 some issues with intubation/record with chart  . DJD (degenerative joint disease)   . DM (diabetes mellitus) ([place]) 04-11-11   tyep II   . DVT femoral (deep venous thrombosis) with thrombophlebitis ([place]) 04-11-11   ?'09-tx. on Xarelton x 3-4 years   . GERD (gastroesophageal reflux disease) 04-11-11   mild, no med in 1 month  . Gout 04-11-11    ankle 2 yrs ago  . Hemorrhoids   . Hypercholesteremia   . Hypertension   . Internal hemorrhoids   . Keloid 04-11-11   multiple-arms,back, chest  . Pulmonary nodule 04-11-11   "PT. NOT AWARE" -denies problems with breathing  . Renal calculus   . Thyroid disease    hypothyroid    Past Surgical History:  Procedure Laterality Date  . cataract surgery  12/2013  . COLONOSCOPY WITH PROPOFOL N/A 02/05/2017   Procedure: COLONOSCOPY WITH PROPOFOL;  Surgeon: Ladene Artist, MD;  [facility];  Service: Endoscopy;  Laterality: N/A;  . COSMETIC SURGERY  2000   Keloid injections  . CYSTOSCOPY WITH BIOPSY  04/14/2011   Procedure: CYSTOSCOPY WITH BIOPSY;  Surgeon: Dutch Gray, MD;  [facility];  Service: Urology;  Laterality: N/A;  Bladder Biopsies   . CYSTOSCOPY/RETROGRADE/URETEROSCOPY  04/14/2011   Procedure: CYSTOSCOPY/RETROGRADE/URETEROSCOPY;  Surgeon: Dutch Gray, MD;  [facility];  Service: Urology;  Laterality: Bilateral;  (Bil) RPG   . LAPAROSCOPIC NEPHRECTOMY Right 05/21/2017   Procedure: LAPAROSCOPIC RADICAL NEPHRECTOMY;  Surgeon: Raynelle Bring, MD;  [facility];  Service: Urology;  Laterality: Right;  . ortho surgery on fingers  2005 & 2008   Dr. Lorelle Formosa finger release  . PROSTATECTOMY  1999   Dr. Rosana Hoes    There were no vitals filed for this visit.  Subjective Assessment - 03/18/18 1114    Subjective  Pt stated he has began the stretches 2x/day without questions.  Currently has some soreness in Lt calf, pain scale 1-2/10.      Pertinent History  Chronic DVT in left lower extremity    Patient Stated Goals  Decreased pain and discomfort    Currently in Pain?  Yes    Pain Score  2  Pain Location  Ankle    Pain Orientation  Left;Posterior;Lateral    Pain Descriptors / Indicators  Aching;Sore    Pain Type  Chronic pain    Pain Onset  More than a month ago    Pain Frequency  Intermittent    Aggravating Factors   walking    Pain Relieving Factors  compression hose                       [facility] Adult PT Treatment/Exercise - 03/18/18 0001      Exercises   Exercises  Ankle      Manual Therapy   Manual Therapy  Joint mobilization    Manual therapy comments  Manual completed separate than rest of tx    Joint Mobilization  Grade II Talocrucal joint mobs      Ankle Exercises: Stretches   Gastroc Stretch  3 reps;30 seconds    Gastroc Stretch Limitations  long leg seated with towel     Other  Stretch  knee drive on 60AV step with AAROM for dorsiflexion      Ankle Exercises: Standing   SLS  5 tries Lt 6", Rt 16"     Rocker Board  2 minutes    Rocker Board Limitations  lateral and DF/PF    Heel Raises  15 reps    Toe Raise  15 reps    Other Standing Ankle Exercises  tandem stance 2x 30" solid surface      Ankle Exercises: Seated   BAPS  Sitting;Level 2;10 reps   DF/PF; Inv/Ev; CW/CCW     Ankle Exercises: Supine   Isometrics  10x each directions with 5" holds               PT Short Term Goals - 03/14/18 1408      PT SHORT TERM GOAL #1   Title  Patient will demonstrate understanding and report regular compliance with HEP in order to improve ankle ROM, lower extremity strength, and overall functional mobility.     Time  2    Period  Weeks    Status  New    Target Date  03/28/18      PT SHORT TERM GOAL #2   Title  Patient will demonstrate ability to maintain single limb stance on each lower extremity for at least 15 seconds demonstrating improved balance, stability, and safety. 2    Time  2    Period  Weeks    Status  New    Target Date  03/28/18        PT Long Term Goals - 03/14/18 1410      PT LONG TERM GOAL #1   Title  Patient will demonstrate improvement in MMT strength of at least 1/2 MMT strength grade in all deficient muscle groups in order to improve mechanics with ambulation and decrease pain with functional activities.     Time  4    Period  Weeks    Status  New    Target Date  04/11/18      PT LONG TERM GOAL #2   Title  Patient will demonstrate improvement in bilateral ankle AROM of at least 5 degrees in deficient areas to improve mechanics with ambulation and decrease pain with functional activities.     Time  4    Period  Weeks    Status  New    Target Date  04/11/18      PT LONG TERM  GOAL #3   Title  Patient will demonstrate ability to ambulate without reports of pain on 2MWT and with minimal compensations for improved safety and  decreased pain during daily activities.     Time  4    Period  Weeks    Status  New    Target Date  04/11/18            Plan - 03/18/18 1156    Clinical Impression Statement  Reviewed goals and assured compliance with HEP.  Pt able to demonstrate appropriate form with seated calf stretch, did require cueing for hold time.  Session focus with ankle mobility, strengthening and balance activiites.  Pt able to demonstrate appropriate mechanics with all therex with min cueing to reduce compensation of knee/hip movements during seated BAPS.  Manual talocrucal mobs complete as well as AA during knee drives to improve dorsiflexion.  No reports of pain through session.      Rehab Potential  Good    Clinical Impairments Affecting Rehab Potential  Positive: Motivated; Negative: Chronicity of leg pain    PT Frequency  2x / week    PT Duration  4 weeks    PT Treatment/Interventions  ADLs/Self Care Home Management;Aquatic Therapy;Electrical Stimulation;DME Instruction;Gait training;Stair training;Functional mobility training;Therapeutic activities;Therapeutic exercise;Balance training;Neuromuscular re-education;Patient/family education;Orthotic Fit/Training;Manual techniques;Passive range of motion;Dry needling;Energy conservation;Taping    PT Next Visit Plan  Continue with bilateral ankle mobility exercises. Talocrural joint mobilizations to improve ankle mobility. Plantarflexion strengthening including eccentric, concentric and isometric phases. Balance activities as able.     PT Home Exercise Plan  03/14/18: Gastroc stretch with towel 3x30 seconds 1-2 x/day       Patient will benefit from skilled therapeutic intervention in order to improve the following deficits and impairments:  Abnormal gait, Improper body mechanics, Pain, Decreased mobility, Increased muscle spasms, Decreased activity tolerance, Decreased range of motion, Decreased strength, Hypomobility, Decreased balance, Difficulty walking,  Impaired flexibility  Visit Diagnosis: Stiffness of left ankle, not elsewhere classified  Stiffness of right ankle, not elsewhere classified  Muscle weakness (generalized)  Other abnormalities of gait and mobility     Problem List Patient Active Problem List   Diagnosis Date Noted  . S/p nephrectomy 02/04/2018  . Neoplasm of right kidney 05/21/2017  . Vitamin D deficiency 03/28/2017  . Special screening for malignant neoplasms, colon   . Benign neoplasm of transverse colon   . History of DVT (deep vein thrombosis) 07/20/2016  . Renal lesion 07/20/2016  . Type 2 diabetes mellitus with stage 3 chronic kidney disease, with long-term current use of insulin ([place]) 01/15/2014  . Chronic idiopathic gout of multiple sites 01/15/2014  . Calcified pleural plaque on chest x-ray 06/30/2013  . Tinea pedis 09/28/2011  . Hematuria 05/19/2011  . HEMORRHOIDS 05/29/2007  . Pulmonary nodule 03/27/2007  . GERD 03/27/2007  . Benign prostatic hyperplasia with urinary obstruction 03/27/2007  . KELOID 03/27/2007  . Mixed hyperlipidemia 01/24/2007  . CARPAL TUNNEL SYNDROME 01/24/2007  . Essential hypertension 01/24/2007  . RENAL CALCULUS 01/24/2007  . Osteoarthritis 01/24/2007   Ihor Austin, [place]; [place]  Aldona Lento 03/18/2018, 12:02 PM  [place] [address] [place], [place], [address] Phone: [phone]   Fax:  [phone]  Name: Hector Rose MRN: [id number] Date of Birth: 26-Feb-1941

## 2018-03-20 ENCOUNTER — Ambulatory Visit (HOSPITAL_COMMUNITY): Payer: PPO | Admitting: Physical Therapy

## 2018-03-20 ENCOUNTER — Encounter (HOSPITAL_COMMUNITY): Payer: Self-pay | Admitting: Physical Therapy

## 2018-03-20 DIAGNOSIS — M6281 Muscle weakness (generalized): Secondary | ICD-10-CM

## 2018-03-20 DIAGNOSIS — M25672 Stiffness of left ankle, not elsewhere classified: Secondary | ICD-10-CM | POA: Diagnosis not present

## 2018-03-20 DIAGNOSIS — R2689 Other abnormalities of gait and mobility: Secondary | ICD-10-CM

## 2018-03-20 DIAGNOSIS — M25671 Stiffness of right ankle, not elsewhere classified: Secondary | ICD-10-CM

## 2018-03-20 NOTE — Therapy (Signed)
[place], [place], [address] Phone: [phone]   Fax:  [phone]  Physical Therapy Treatment  Patient Details  Name: Hector Rose MRN: [id number] Date of Birth: 1941-01-25 Referring Provider (PT): Ardeen Jourdain, [place]   Encounter Date: 03/20/2018  PT End of Session - 03/20/18 1006    Visit Number  3    Number of Visits  9    Date for PT Re-Evaluation  04/11/18    Authorization Type  [employer] - Visits based on Medical Necessity    Authorization Time Period  03/14/18 - 04/11/18    Authorization - Visit Number  3    Authorization - Number of Visits  10    PT Start Time  0950    PT Stop Time  1030    PT Time Calculation (min)  40 min    Activity Tolerance  Patient tolerated treatment well    Behavior During Therapy  [facility] for tasks assessed/performed       Past Medical History:  Diagnosis Date  . Asthma 04-11-11   AS CHILD ONLY  . BPH (benign prostatic hypertrophy) with urinary obstruction   . Carpal tunnel syndrome    patient denies at 05/14/17 preop visit   . Difficult intubation 04-11-11   08-11-97 some issues with intubation/record with chart  . DJD (degenerative joint disease)   . DM (diabetes mellitus) ([place]) 04-11-11   tyep II   . DVT femoral (deep venous thrombosis) with thrombophlebitis ([place]) 04-11-11   ?'09-tx. on Xarelton x 3-4 years   . GERD (gastroesophageal reflux disease) 04-11-11   mild, no med in 1 month  . Gout 04-11-11    ankle 2 yrs ago  . Hemorrhoids   . Hypercholesteremia   . Hypertension   . Internal hemorrhoids   . Keloid 04-11-11   multiple-arms,back, chest  . Pulmonary nodule 04-11-11   "PT. NOT AWARE" -denies problems with breathing  . Renal calculus   . Thyroid disease    hypothyroid    Past Surgical History:  Procedure Laterality Date  . cataract surgery  12/2013  . COLONOSCOPY WITH PROPOFOL N/A 02/05/2017   Procedure: COLONOSCOPY WITH PROPOFOL;  Surgeon: Ladene Artist, MD;  Location: [facility];  Service: Endoscopy;  Laterality: N/A;  . COSMETIC SURGERY  2000   Keloid injections  . CYSTOSCOPY WITH BIOPSY  04/14/2011   Procedure: CYSTOSCOPY WITH BIOPSY;  Surgeon: Dutch Gray, MD;  Location: [facility];  Service: Urology;  Laterality: N/A;  Bladder Biopsies   . CYSTOSCOPY/RETROGRADE/URETEROSCOPY  04/14/2011   Procedure: CYSTOSCOPY/RETROGRADE/URETEROSCOPY;  Surgeon: Dutch Gray, MD;  Location: [facility];  Service: Urology;  Laterality: Bilateral;  (Bil) RPG   . LAPAROSCOPIC NEPHRECTOMY Right 05/21/2017   Procedure: LAPAROSCOPIC RADICAL NEPHRECTOMY;  Surgeon: Raynelle Bring, MD;  Location: [facility];  Service: Urology;  Laterality: Right;  . ortho surgery on fingers  2005 & 2008   Dr. Lorelle Formosa finger release  . PROSTATECTOMY  1999   Dr. Rosana Hoes    There were no vitals filed for this visit.  Subjective Assessment - 03/20/18 0957    Subjective  Patient reported that he has had some calf soreness and he did not do his exercises yesterday. Patient reported that last time he had a DVT his leg was swolen and he also reported that he is taking Xaralto to control this.     Pertinent History  Chronic DVT in left lower extremity    Patient Stated Goals  Decreased pain and discomfort    Currently in Pain?  No/denies         [facility] PT Assessment - 03/20/18 0001      Observation/Other Assessments   Observations  No noted swelling or redness in patient's calf or legs.       Palpation   Palpation comment  No excessive warmth felt in patient's leg or calf.                    [facility] PT Treatment/Exercise - 03/20/18 0001      Ankle Exercises: Stretches   Gastroc Stretch  3 reps;30 seconds    Gastroc Stretch Limitations  both legs slant board    Other Stretch  knee drive on 56CL step with AAROM for dorsiflexion      Ankle Exercises: Standing   Rocker Board  2 minutes    Rocker Board Limitations  lateral and DF/PF    Heel Raises  15 reps    Concentric, isometric, eccentric phase on 2'' step with VCs   Toe Raise  15 reps    Other Standing Ankle Exercises  tandem stance 2x 30" solid surface      Ankle Exercises: Seated   BAPS  Sitting;Level 2;10 reps   DF/PF, Lt./Rt., CW/CCW              PT Short Term Goals - 03/14/18 1408      PT SHORT TERM GOAL #1   Title  Patient will demonstrate understanding and report regular compliance with HEP in order to improve ankle ROM, lower extremity strength, and overall functional mobility.     Time  2    Period  Weeks    Status  New    Target Date  03/28/18      PT SHORT TERM GOAL #2   Title  Patient will demonstrate ability to maintain single limb stance on each lower extremity for at least 15 seconds demonstrating improved balance, stability, and safety. 2    Time  2    Period  Weeks    Status  New    Target Date  03/28/18        PT Long Term Goals - 03/14/18 1410      PT LONG TERM GOAL #1   Title  Patient will demonstrate improvement in MMT strength of at least 1/2 MMT strength grade in all deficient muscle groups in order to improve mechanics with ambulation and decrease pain with functional activities.     Time  4    Period  Weeks    Status  New    Target Date  04/11/18      PT LONG TERM GOAL #2   Title  Patient will demonstrate improvement in bilateral ankle AROM of at least 5 degrees in deficient areas to improve mechanics with ambulation and decrease pain with functional activities.     Time  4    Period  Weeks    Status  New    Target Date  04/11/18      PT LONG TERM GOAL #3   Title  Patient will demonstrate ability to ambulate without reports of pain on 2MWT and with minimal compensations for improved safety and decreased pain during daily activities.     Time  4    Period  Weeks    Status  New    Target Date  04/11/18            Plan -  03/20/18 1012    Clinical Impression Statement  This session began by discussing with patient importance of  watching his calf for possible swelling and warmth as well as palpating the patient's calf for signs of possible DVT. No warmth or swelling was noted and therefore continued session as planned. This session progressed patient with heel raises to include isometric, eccentric, and concentric phases. Patient reported that his calf and ankle felt good throughout session. Patient would benefit from skilled physical therapy in order to continue progressing towards functional goals. Plan to continue monitoring for signs of DVT.     Rehab Potential  Good    Clinical Impairments Affecting Rehab Potential  Positive: Motivated; Negative: Chronicity of leg pain    PT Frequency  2x / week    PT Duration  4 weeks    PT Treatment/Interventions  ADLs/Self Care Home Management;Aquatic Therapy;Electrical Stimulation;DME Instruction;Gait training;Stair training;Functional mobility training;Therapeutic activities;Therapeutic exercise;Balance training;Neuromuscular re-education;Patient/family education;Orthotic Fit/Training;Manual techniques;Passive range of motion;Dry needling;Energy conservation;Taping    PT Next Visit Plan  Continue with bilateral ankle mobility exercises. Talocrural joint mobilizations to improve ankle mobility, consider distraction at next session. Plantarflexion strengthening including eccentric, concentric and isometric phases. Balance activities as able.     PT Home Exercise Plan  03/14/18: Gastroc stretch with towel 3x30 seconds 1-2 x/day       Patient will benefit from skilled therapeutic intervention in order to improve the following deficits and impairments:  Abnormal gait, Improper body mechanics, Pain, Decreased mobility, Increased muscle spasms, Decreased activity tolerance, Decreased range of motion, Decreased strength, Hypomobility, Decreased balance, Difficulty walking, Impaired flexibility  Visit Diagnosis: Stiffness of left ankle, not elsewhere classified  Stiffness of right ankle, not  elsewhere classified  Muscle weakness (generalized)  Other abnormalities of gait and mobility     Problem List Patient Active Problem List   Diagnosis Date Noted  . S/p nephrectomy 02/04/2018  . Neoplasm of right kidney 05/21/2017  . Vitamin D deficiency 03/28/2017  . Special screening for malignant neoplasms, colon   . Benign neoplasm of transverse colon   . History of DVT (deep vein thrombosis) 07/20/2016  . Renal lesion 07/20/2016  . Type 2 diabetes mellitus with stage 3 chronic kidney disease, with long-term current use of insulin ([place]) 01/15/2014  . Chronic idiopathic gout of multiple sites 01/15/2014  . Calcified pleural plaque on chest x-ray 06/30/2013  . Tinea pedis 09/28/2011  . Hematuria 05/19/2011  . HEMORRHOIDS 05/29/2007  . Pulmonary nodule 03/27/2007  . GERD 03/27/2007  . Benign prostatic hyperplasia with urinary obstruction 03/27/2007  . KELOID 03/27/2007  . Mixed hyperlipidemia 01/24/2007  . CARPAL TUNNEL SYNDROME 01/24/2007  . Essential hypertension 01/24/2007  . RENAL CALCULUS 01/24/2007  . Osteoarthritis 01/24/2007   Clarene Critchley PT, DPT 10:36 AM, 03/20/18 [place], [place], [address] Phone: [phone]   Fax:  [phone]  Name: Markos Theil MRN: [id number] Date of Birth: [birth year]

## 2018-03-21 ENCOUNTER — Ambulatory Visit (INDEPENDENT_AMBULATORY_CARE_PROVIDER_SITE_OTHER): Payer: PPO | Admitting: Family Medicine

## 2018-03-21 ENCOUNTER — Encounter: Payer: Self-pay | Admitting: Family Medicine

## 2018-03-21 VITALS — BP 110/58 | HR 48 | Temp 98.1°F | Ht 73.0 in | Wt 225.0 lb

## 2018-03-21 DIAGNOSIS — N183 Chronic kidney disease, stage 3 unspecified: Secondary | ICD-10-CM

## 2018-03-21 DIAGNOSIS — E782 Mixed hyperlipidemia: Secondary | ICD-10-CM

## 2018-03-21 DIAGNOSIS — E1122 Type 2 diabetes mellitus with diabetic chronic kidney disease: Secondary | ICD-10-CM

## 2018-03-21 DIAGNOSIS — I10 Essential (primary) hypertension: Secondary | ICD-10-CM | POA: Diagnosis not present

## 2018-03-21 DIAGNOSIS — Z794 Long term (current) use of insulin: Secondary | ICD-10-CM | POA: Diagnosis not present

## 2018-03-21 DIAGNOSIS — R252 Cramp and spasm: Secondary | ICD-10-CM | POA: Diagnosis not present

## 2018-03-21 DIAGNOSIS — Z905 Acquired absence of kidney: Secondary | ICD-10-CM | POA: Diagnosis not present

## 2018-03-21 NOTE — Patient Instructions (Signed)
Leg Cramps Leg cramps occur when one or more muscles tighten and you have no control over this tightening (involuntary muscle contraction). Muscle cramps can develop in any muscle, but the most common place is in the calf muscles of the leg. Those cramps can occur during exercise or when you are at rest. Leg cramps are painful, and they may last for a few seconds to a few minutes. Cramps may return several times before they finally stop. Usually, leg cramps are not caused by a serious medical problem. In many cases, the cause is not known. Some common causes include:  Excessive physical effort (overexertion), such as during intense exercise.  Overuse from repetitive motions, or doing the same thing over and over.  Staying in a certain position for a long period of time.  Improper preparation, form, or technique while performing a sport or an activity.  Dehydration.  Injury.  Side effects of certain medicines.  Abnormally low levels of minerals in your blood (electrolytes), especially potassium and calcium. This could result from: ? Pregnancy. ? Taking diuretic medicines. Follow these instructions at home: Eating and drinking  Drink enough fluid to keep your urine pale yellow. Staying hydrated may help prevent cramps.  Eat a healthy diet that includes plenty of nutrients to help your muscles function. A healthy diet includes fruits and vegetables, lean protein, whole grains, and low-fat or nonfat dairy products. Managing pain, stiffness, and swelling      Try massaging, stretching, and relaxing the affected muscle. Do this for several minutes at a time.  If directed, put ice on areas that are sore or painful after a cramp: ? Put ice in a plastic bag. ? Place a towel between your skin and the bag. ? Leave the ice on for 20 minutes, 2-3 times a day.  If directed, apply heat to muscles that are tense or tight. Do this before you exercise, or as often as told by your health care  provider. Use the heat source that your health care provider recommends, such as a moist heat pack or a heating pad. ? Place a towel between your skin and the heat source. ? Leave the heat on for 20-30 minutes. ? Remove the heat if your skin turns bright red. This is especially important if you are unable to feel pain, heat, or cold. You may have a greater risk of getting burned.  Try taking hot showers or baths to help relax tight muscles. General instructions  If you are having frequent leg cramps, avoid intense exercise for several days.  Take over-the-counter and prescription medicines only as told by your health care provider.  Keep all follow-up visits as told by your health care provider. This is important. Contact a health care provider if:  Your leg cramps get more severe or more frequent, or they do not improve over time.  Your foot becomes cold, numb, or blue. Summary  Muscle cramps can develop in any muscle, but the most common place is in the calf muscles of the leg.  Leg cramps are painful, and they may last for a few seconds to a few minutes.  Usually, leg cramps are not caused by a serious medical problem. Often, the cause is not known.  Stay hydrated and take over-the-counter and prescription medicines only as told by your health care provider. This information is not intended to replace advice given to you by your health care provider. Make sure you discuss any questions you have with your health care  provider. Document Released: 04/06/2004 Document Revised: 12/07/2016 Document Reviewed: 12/07/2016 Elsevier Interactive Patient Education  2019 Else[employer] Inc.  High Cholesterol  High cholesterol is a condition in which the blood has high levels of a white, waxy, fat-like substance (cholesterol). The human body needs small amounts of cholesterol. The liver makes all the cholesterol that the body needs. Extra (excess) cholesterol comes from the food that we  eat. Cholesterol is carried from the liver by the blood through the blood vessels. If you have high cholesterol, deposits (plaques) may build up on the walls of your blood vessels (arteries). Plaques make the arteries narrower and stiffer. Cholesterol plaques increase your risk for heart attack and stroke. Work with your health care provider to keep your cholesterol levels in a healthy range. What increases the risk? This condition is more likely to develop in people who:  Eat foods that are high in animal fat (saturated fat) or cholesterol.  Are overweight.  Are not getting enough exercise.  Have a family history of high cholesterol. What are the signs or symptoms? There are no symptoms of this condition. How is this diagnosed? This condition may be diagnosed from the results of a blood test.  If you are older than age 97, your health care provider may check your cholesterol every 4-6 years.  You may be checked more often if you already have high cholesterol or other risk factors for heart disease. The blood test for cholesterol measures:  "Bad" cholesterol (LDL cholesterol). This is the main type of cholesterol that causes heart disease. The desired level for LDL is less than 100.  "Good" cholesterol (HDL cholesterol). This type helps to protect against heart disease by cleaning the arteries and carrying the LDL away. The desired level for HDL is 60 or higher.  Triglycerides. These are fats that the body can store or burn for energy. The desired number for triglycerides is lower than 150.  Total cholesterol. This is a measure of the total amount of cholesterol in your blood, including LDL cholesterol, HDL cholesterol, and triglycerides. A healthy number is less than 200. How is this treated? This condition is treated with diet changes, lifestyle changes, and medicines. Diet changes  This may include eating more whole grains, fruits, vegetables, nuts, and fish.  This may also  include cutting back on red meat and foods that have a lot of added sugar. Lifestyle changes  Changes may include getting at least 40 minutes of aerobic exercise 3 times a week. Aerobic exercises include walking, biking, and swimming. Aerobic exercise along with a healthy diet can help you maintain a healthy weight.  Changes may also include quitting smoking. Medicines  Medicines are usually given if diet and lifestyle changes have failed to reduce your cholesterol to healthy levels.  Your health care provider may prescribe a statin medicine. Statin medicines have been shown to reduce cholesterol, which can reduce the risk of heart disease. Follow these instructions at home: Eating and drinking If told by your health care provider:  Eat chicken (without skin), fish, veal, shellfish, ground [place] breast, and round or loin cuts of red meat.  Do not eat fried foods or fatty meats, such as hot dogs and salami.  Eat plenty of fruits, such as apples.  Eat plenty of vegetables, such as broccoli, potatoes, and carrots.  Eat beans, peas, and lentils.  Eat grains such as barley, rice, couscous, and bulgur wheat.  Eat pasta without cream sauces.  Use skim or nonfat milk, and  eat low-fat or nonfat yogurt and cheeses.  Do not eat or drink whole milk, cream, ice cream, egg yolks, or hard cheeses.  Do not eat stick margarine or tub margarines that contain trans fats (also called partially hydrogenated oils).  Do not eat saturated tropical oils, such as coconut oil and palm oil.  Do not eat cakes, cookies, crackers, or other baked goods that contain trans fats.  General instructions  Exercise as directed by your health care provider. Increase your activity level with activities such as gardening, walking, and taking the stairs.  Take over-the-counter and prescription medicines only as told by your health care provider.  Do not use any products that contain nicotine or tobacco, such as  cigarettes and e-cigarettes. If you need help quitting, ask your health care provider.  Keep all follow-up visits as told by your health care provider. This is important. Contact a health care provider if:  You are struggling to maintain a healthy diet or weight.  You need help to start on an exercise program.  You need help to stop smoking. Get help right away if:  You have chest pain.  You have trouble breathing. This information is not intended to replace advice given to you by your health care provider. Make sure you discuss any questions you have with your health care provider. Document Released: 02/27/2005 Document Revised: 09/25/2015 Document Reviewed: 08/28/2015 Elsevier Interactive Patient Education  2019 [employer].  Diabetes Mellitus and Standards of Medical Care Managing diabetes (diabetes mellitus) can be complicated. Your diabetes treatment may be managed by a team of health care providers, including:  A physician who specializes in diabetes (endocrinologist).  A nurse practitioner or physician assistant.  Nurses.  A diet and nutrition specialist (registered dietitian).  A certified diabetes educator (CDE).  An exercise specialist.  A pharmacist.  An eye doctor.  A foot specialist (podiatrist).  A dentist.  A primary care provider.  A mental health provider. Your health care providers follow guidelines to help you get the best quality of care. The following schedule is a general guideline for your diabetes management plan. Your health care providers may give you more specific instructions. Physical exams Upon being diagnosed with diabetes mellitus, and each year after that, your health care provider will ask about your medical and family history. He or she will also do a physical exam. Your exam may include:  Measuring your height, weight, and body mass index (BMI).  Checking your blood pressure. This will be done at every routine medical visit.  Your target blood pressure may vary depending on your medical conditions, your age, and other factors.  Thyroid gland exam.  Skin exam.  Screening for damage to your nerves (peripheral neuropathy). This may include checking the pulse in your legs and feet and checking the level of sensation in your hands and feet.  A complete foot exam to inspect the structure and skin of your feet, including checking for cuts, bruises, redness, blisters, sores, or other problems.  Screening for blood vessel (vascular) problems, which may include checking the pulse in your legs and feet and checking your temperature. Blood tests Depending on your treatment plan and your personal needs, you may have the following tests done:  HbA1c (hemoglobin A1c). This test provides information about blood sugar (glucose) control over the previous 2-3 months. It is used to adjust your treatment plan, if needed. This test will be done: ? At least 2 times a year, if you are meeting your  treatment goals. ? 4 times a year, if you are not meeting your treatment goals or if treatment goals have changed.  Lipid testing, including total, LDL, and HDL cholesterol and triglyceride levels. ? The goal for LDL is less than 100 mg/dL (5.5 mmol/L). If you are at high risk for complications, the goal is less than 70 mg/dL (3.9 mmol/L). ? The goal for HDL is 40 mg/dL (2.2 mmol/L) or higher for men and 50 mg/dL (2.8 mmol/L) or higher for women. An HDL cholesterol of 60 mg/dL (3.3 mmol/L) or higher gives some protection against heart disease. ? The goal for triglycerides is less than 150 mg/dL (8.3 mmol/L).  Liver function tests.  Kidney function tests.  Thyroid function tests. Dental and eye exams  Visit your dentist two times a year.  If you have type 1 diabetes, your health care provider may recommend an eye exam 3-5 years after you are diagnosed, and then once a year after your first exam. ? For children with type 1 diabetes, a  health care provider may recommend an eye exam when your child is age 30 or older and has had diabetes for 3-5 years. After the first exam, your child should get an eye exam once a year.  If you have type 2 diabetes, your health care provider may recommend an eye exam as soon as you are diagnosed, and then once a year after your first exam. Immunizations   The yearly flu (influenza) vaccine is recommended for everyone 6 months or older who has diabetes.  The pneumonia (pneumococcal) vaccine is recommended for everyone 2 years or older who has diabetes. If you are 61 or older, you may get the pneumonia vaccine as a series of two separate shots.  The hepatitis B vaccine is recommended for adults shortly after being diagnosed with diabetes.  Adults and children with diabetes should receive all other vaccines according to age-specific recommendations from the Centers for Disease Control and Prevention (CDC). Mental and emotional health Screening for symptoms of eating disorders, anxiety, and depression is recommended at the time of diagnosis and afterward as needed. If your screening shows that you have symptoms (positive screening result), you may need more evaluation and you may work with a mental health care provider. Treatment plan Your treatment plan will be reviewed at every medical visit. You and your health care provider will discuss:  How you are taking your medicines, including insulin.  Any side effects you are experiencing.  Your blood glucose target goals.  The frequency of your blood glucose monitoring.  Lifestyle habits, such as activity level as well as tobacco, alcohol, and substance use. Diabetes self-management education Your health care provider will assess how well you are monitoring your blood glucose levels and whether you are taking your insulin correctly. He or she may refer you to:  A certified diabetes educator to manage your diabetes throughout your life,  starting at diagnosis.  A registered dietitian who can create or review your personal nutrition plan.  An exercise specialist who can discuss your activity level and exercise plan. Summary  Managing diabetes (diabetes mellitus) can be complicated. Your diabetes treatment may be managed by a team of health care providers.  Your health care providers follow guidelines in order to help you get the best quality of care.  Standards of care including having regular physical exams, blood tests, blood pressure monitoring, immunizations, screening tests, and education about how to manage your diabetes.  Your health care providers may also  give you more specific instructions based on your individual health. This information is not intended to replace advice given to you by your health care provider. Make sure you discuss any questions you have with your health care provider. Document Released: 12/25/2008 Document Revised: 11/16/2017 Document Reviewed: 11/26/2015 Elsevier Interactive Patient Education  2019 [employer].

## 2018-03-25 ENCOUNTER — Encounter: Payer: Self-pay | Admitting: Family Medicine

## 2018-03-25 NOTE — Progress Notes (Signed)
Subjective:    Patient ID: Hector Rose, male    DOB: 07-05-1940, 78 y.o.   MRN: [id number]  No chief complaint on file.   HPI Patient was seen today for follow-up on chronic conditions and TOC, previously seen by Dr. Lenna Gilford.  HLD: -taking rosuvastatin 20 mg daily -denies myalgias.  Had them in the past with zocor -working out 3x/wk  HTN: -taking norvasc 10 mg, and lopressor 50 mg BID -denies HAs, CP, blurred vision -working out 3x/wk with his wife.  DM II: -taking novolin 70/30 40 units in am.  Takes 30 units in pm if fsbs >90. -pt has an allergy to metformin, caused itching -denies hypoglycemia -followed by Endocrinology, Dr. Dorris Fetch in [place].  Kidney dz: -stage 3 CKD -h/o R nephrectomy for enlarging cystic lesion seen on MRI.  H/o BPH: -s/p prostatectomy for BPH with obstruction -followed by Urology  Muscle cramps: -notes off and on in legs -feels different from statin myalgia  Social Hx: Pt is married to [employer].  Pt is retired.  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    Past Medical History:  Diagnosis Date  . Asthma 04-11-11   AS CHILD ONLY  . BPH (benign prostatic hypertrophy) with urinary obstruction   . Carpal tunnel syndrome    patient denies at 05/14/17 preop visit   . Difficult intubation 04-11-11   08-11-97 some issues with intubation/record with chart  . DJD (degenerative joint disease)   . DM (diabetes mellitus) ([place]) 04-11-11   tyep II   . DVT femoral (deep venous thrombosis) with thrombophlebitis ([place]) 04-11-11   ?'09-tx. on Xarelton x 3-4 years   . GERD (gastroesophageal reflux disease) 04-11-11   mild, no med in 1 month  . Gout 04-11-11    ankle 2 yrs ago  . Hemorrhoids   . Hypercholesteremia   . Hypertension   . Internal hemorrhoids   . Keloid 04-11-11   multiple-arms,back, chest  . Pulmonary nodule 04-11-11   "PT. NOT AWARE" -denies problems with breathing  . Renal calculus   . Thyroid disease    hypothyroid    Allergies  Allergen Reactions  . Ace Inhibitors Swelling    REACTION: angio edema (swelling of lips)  . Metformin And Related     Itching in higher doses     ROS General: Denies fever, chills, night sweats, changes in weight, changes in appetite HEENT: Denies headaches, ear pain, changes in vision, rhinorrhea, sore throat CV: Denies CP, palpitations, SOB, orthopnea Pulm: Denies SOB, cough, wheezing GI: Denies abdominal pain, nausea, vomiting, diarrhea, constipation GU: Denies dysuria, hematuria, frequency, vaginal discharge Msk: Denies muscle cramps, joint pains Neuro: Denies weakness, numbness, tingling  +muscle cramps Skin: Denies rashes, bruising  Psych: Denies depression, anxiety, hallucinations    Objective:    Blood pressure (!) 110/58, pulse (!) 48, temperature 98.1 F (36.7 C), temperature source Oral, height 6\' 1"  (1.854 m), weight 225 lb (102.1 kg), SpO2 98 %.  Gen. Pleasant, well-nourished, in no distress, normal affect   HEENT: [place]/AT, face symmetric, no scleral icterus, PERRLA, nares patent without drainage, pharynx without erythema or exudate.  TMs normal b/l. Lungs: no accessory muscle use, CTAB, no wheezes or rales Cardiovascular: RRR, no m/r/g, no peripheral edema Musculoskeletal: No deformities, no cyanosis or clubbing, normal tone Neuro:  A&Ox3, CN II-XII intact, normal gait Skin:  Warm, no lesions/ rash  Wt Readings from Last 3 Encounters:  03/21/18 225 lb (102.1 kg)  02/25/18  229 lb (103.9 kg)  02/04/18 231 lb (104.8 kg)    Lab Results  Component Value Date   WBC 7.7 01/30/2018   HGB 14.6 01/30/2018   HCT 42.3 01/30/2018   PLT 153.0 01/30/2018   GLUCOSE 153 (H) 02/21/2018   CHOL 222 (H) 11/20/2017   TRIG 118 11/20/2017   HDL 46 11/20/2017   LDLDIRECT 147.5 06/10/2012   LDLCALC 152 (H) 11/20/2017   ALT 19 02/21/2018   AST 24 02/21/2018   NA 143 02/21/2018   K 5.4 (H) 02/21/2018   CL 105 02/21/2018   CREATININE 2.17 (H)  02/21/2018   BUN 20 02/21/2018   CO2 33 (H) 02/21/2018   TSH 2.62 01/30/2018   PSA 1.19 01/15/2014   INR 1.00 04/14/2011   HGBA1C 7.3 (H) 02/21/2018   MICROALBUR 36.4 11/20/2017    Assessment/Plan:  Mixed hyperlipidemia -Continue simvastatin 20 mg daily -Discussed lifestyle modifications better dietary choices. -Last lipid panel 11/20/2017   S/p nephrectomy -Continue to monitor. -Patient encouraged to stay hydrated -Avoid nephrotoxic medication -Discussed follow-up with nephrology  Type 2 diabetes mellitus with stage 3 chronic kidney disease, with long-term current use of insulin ([facility]) -Continue Novolin 70/30 20 units in a.m. and 30 units in p.m. if FS BS greater than 90 -Continue following with endocrinology in [place] -Discussed better dietary choices  Essential hypertension -controlled -continue Norvasc 10 mg and loperssor 50 mg BID -Discussed lifestyle modification.  Continue From 3 times per week  Muscle cramp -discussed various causes including hypokalemia, dehydration, etc. -recent BMP with K+ at 5.4 -consider a teaspoon of yellow mustard, Turmeric, increasing po intake of water.  F/u in 3 months  Grier Mitts, MD

## 2018-03-26 ENCOUNTER — Ambulatory Visit (HOSPITAL_COMMUNITY): Payer: PPO | Admitting: Physical Therapy

## 2018-03-26 ENCOUNTER — Encounter (HOSPITAL_COMMUNITY): Payer: Self-pay | Admitting: Physical Therapy

## 2018-03-26 DIAGNOSIS — M25672 Stiffness of left ankle, not elsewhere classified: Secondary | ICD-10-CM | POA: Diagnosis not present

## 2018-03-26 DIAGNOSIS — M6281 Muscle weakness (generalized): Secondary | ICD-10-CM

## 2018-03-26 DIAGNOSIS — R2689 Other abnormalities of gait and mobility: Secondary | ICD-10-CM

## 2018-03-26 DIAGNOSIS — M25671 Stiffness of right ankle, not elsewhere classified: Secondary | ICD-10-CM

## 2018-03-26 NOTE — Therapy (Signed)
[place], [place], [address] Phone: [phone]   Fax:  [phone]  Physical Therapy Treatment  Patient Details  Name: Hector Rose MRN: [id number] Date of Birth: [birth year] Referring Provider (PT): Ardeen Jourdain, [place]   Encounter Date: 03/26/2018  PT End of Session - 03/26/18 1116    Visit Number  4    Number of Visits  9    Date for PT Re-Evaluation  04/11/18    Authorization Type  [employer] - Visits based on Medical Necessity    Authorization Time Period  03/14/18 - 04/11/18    Authorization - Visit Number  4    Authorization - Number of Visits  10    PT Start Time  1112    PT Stop Time  1153    PT Time Calculation (min)  41 min    Activity Tolerance  Patient tolerated treatment well    Behavior During Therapy  [facility] for tasks assessed/performed       Past Medical History:  Diagnosis Date  . Asthma 04-11-11   AS CHILD ONLY  . BPH (benign prostatic hypertrophy) with urinary obstruction   . Carpal tunnel syndrome    patient denies at 05/14/17 preop visit   . Difficult intubation 04-11-11   08-11-97 some issues with intubation/record with chart  . DJD (degenerative joint disease)   . DM (diabetes mellitus) ([place]) 04-11-11   tyep II   . DVT femoral (deep venous thrombosis) with thrombophlebitis ([place]) 04-11-11   ?'09-tx. on Xarelton x 3-4 years   . GERD (gastroesophageal reflux disease) 04-11-11   mild, no med in 1 month  . Gout 04-11-11    ankle 2 yrs ago  . Hemorrhoids   . Hypercholesteremia   . Hypertension   . Internal hemorrhoids   . Keloid 04-11-11   multiple-arms,back, chest  . Pulmonary nodule 04-11-11   "PT. NOT AWARE" -denies problems with breathing  . Renal calculus   . Thyroid disease    hypothyroid    Past Surgical History:  Procedure Laterality Date  . cataract surgery  12/2013  . COLONOSCOPY WITH PROPOFOL N/A 02/05/2017   Procedure: COLONOSCOPY WITH PROPOFOL;  Surgeon: Ladene Artist, MD;  Location: [facility];  Service: Endoscopy;  Laterality: N/A;  . COSMETIC SURGERY  2000   Keloid injections  . CYSTOSCOPY WITH BIOPSY  04/14/2011   Procedure: CYSTOSCOPY WITH BIOPSY;  Surgeon: Dutch Gray, MD;  Location: [facility];  Service: Urology;  Laterality: N/A;  Bladder Biopsies   . CYSTOSCOPY/RETROGRADE/URETEROSCOPY  04/14/2011   Procedure: CYSTOSCOPY/RETROGRADE/URETEROSCOPY;  Surgeon: Dutch Gray, MD;  Location: [facility];  Service: Urology;  Laterality: Bilateral;  (Bil) RPG   . LAPAROSCOPIC NEPHRECTOMY Right 05/21/2017   Procedure: LAPAROSCOPIC RADICAL NEPHRECTOMY;  Surgeon: Raynelle Bring, MD;  Location: [facility];  Service: Urology;  Laterality: Right;  . ortho surgery on fingers  2005 & 2008   Dr. Lorelle Formosa finger release  . PROSTATECTOMY  1999   Dr. Rosana Hoes    There were no vitals filed for this visit.  Subjective Assessment - 03/26/18 1115    Subjective  Patient stated that his physician appointment went well. He reported intermittent soreness, but none presently.     Pertinent History  Chronic DVT in left lower extremity    Patient Stated Goals  Decreased pain and discomfort    Currently in Pain?  No/denies  [facility] Adult PT Treatment/Exercise - 03/26/18 0001      Manual Therapy   Manual Therapy  Joint mobilization    Manual therapy comments  Manual completed separate than rest of tx    Joint Mobilization  Grade III Talocrucal joint mobs to the left LE 4 x 20 seconds      Ankle Exercises: Stretches   Gastroc Stretch  3 reps;30 seconds    Gastroc Stretch Limitations  both legs slant board    Other Stretch  knee drive on 19JY step with AAROM for dorsiflexion 10x 10''      Ankle Exercises: Standing   Rocker Board  2 minutes    Rocker Board Limitations  lateral and DF/PF    Heel Raises  15 reps   Concentric, isometric, eccentric phase on 2'' step   Toe Raise  15 reps    Other Standing Ankle Exercises  tandem stance 2x 30"  solid surface      Ankle Exercises: Seated   BAPS  Sitting;Level 2;10 reps   DF/PF, Left/Right, CW, CCW left LE            PT Education - 03/26/18 1116    Education Details  Discussed purpose and technique of interventions throughout session and continuing to monitor for redness or swelling.     Person(s) Educated  Patient    Methods  Explanation    Comprehension  Verbalized understanding       PT Short Term Goals - 03/14/18 1408      PT SHORT TERM GOAL #1   Title  Patient will demonstrate understanding and report regular compliance with HEP in order to improve ankle ROM, lower extremity strength, and overall functional mobility.     Time  2    Period  Weeks    Status  New    Target Date  03/28/18      PT SHORT TERM GOAL #2   Title  Patient will demonstrate ability to maintain single limb stance on each lower extremity for at least 15 seconds demonstrating improved balance, stability, and safety. 2    Time  2    Period  Weeks    Status  New    Target Date  03/28/18        PT Long Term Goals - 03/14/18 1410      PT LONG TERM GOAL #1   Title  Patient will demonstrate improvement in MMT strength of at least 1/2 MMT strength grade in all deficient muscle groups in order to improve mechanics with ambulation and decrease pain with functional activities.     Time  4    Period  Weeks    Status  New    Target Date  04/11/18      PT LONG TERM GOAL #2   Title  Patient will demonstrate improvement in bilateral ankle AROM of at least 5 degrees in deficient areas to improve mechanics with ambulation and decrease pain with functional activities.     Time  4    Period  Weeks    Status  New    Target Date  04/11/18      PT LONG TERM GOAL #3   Title  Patient will demonstrate ability to ambulate without reports of pain on 2MWT and with minimal compensations for improved safety and decreased pain during daily activities.     Time  4    Period  Weeks    Status  New    Target  Date  04/11/18            Plan - 03/26/18 1156    Clinical Impression Statement  This session continued with established plan of care. Focused on patient's ankle mobility this session. This session performed talocrural joint mobilization to patient's left ankle to improve mobility and decrease any stiffness. Patient tolerated all exercises well. Noted that patient's right ankle had slight swelling in it. Patient denied any shortness of breath or other symptoms. Therapist educated patient to continue to monitor this and call his physician if it got worse or if he began to have any symptoms such as shortness or breath, dizziness, or pain. Plan to continue progression of balance, strengthening, and mobility exercises.     Rehab Potential  Good    Clinical Impairments Affecting Rehab Potential  Positive: Motivated; Negative: Chronicity of leg pain    PT Frequency  2x / week    PT Duration  4 weeks    PT Treatment/Interventions  ADLs/Self Care Home Management;Aquatic Therapy;Electrical Stimulation;DME Instruction;Gait training;Stair training;Functional mobility training;Therapeutic activities;Therapeutic exercise;Balance training;Neuromuscular re-education;Patient/family education;Orthotic Fit/Training;Manual techniques;Passive range of motion;Dry needling;Energy conservation;Taping    PT Next Visit Plan  Follow-up about any edema in patient's ankles. Continue with bilateral ankle mobility exercises. Talocrural joint mobilizations to improve ankle mobility, consider distraction at next session. Plantarflexion strengthening including eccentric, concentric and isometric phases. Balance activities as able.     PT Home Exercise Plan  03/14/18: Gastroc stretch with towel 3x30 seconds 1-2 x/day       Patient will benefit from skilled therapeutic intervention in order to improve the following deficits and impairments:  Abnormal gait, Improper body mechanics, Pain, Decreased mobility, Increased muscle spasms,  Decreased activity tolerance, Decreased range of motion, Decreased strength, Hypomobility, Decreased balance, Difficulty walking, Impaired flexibility  Visit Diagnosis: Stiffness of left ankle, not elsewhere classified  Stiffness of right ankle, not elsewhere classified  Muscle weakness (generalized)  Other abnormalities of gait and mobility     Problem List Patient Active Problem List   Diagnosis Date Noted  . S/p nephrectomy 02/04/2018  . Neoplasm of right kidney 05/21/2017  . Vitamin D deficiency 03/28/2017  . Special screening for malignant neoplasms, colon   . Benign neoplasm of transverse colon   . History of DVT (deep vein thrombosis) 07/20/2016  . Renal lesion 07/20/2016  . Type 2 diabetes mellitus with stage 3 chronic kidney disease, with long-term current use of insulin ([place]) 01/15/2014  . Chronic idiopathic gout of multiple sites 01/15/2014  . Calcified pleural plaque on chest x-ray 06/30/2013  . Tinea pedis 09/28/2011  . Hematuria 05/19/2011  . HEMORRHOIDS 05/29/2007  . Pulmonary nodule 03/27/2007  . GERD 03/27/2007  . Benign prostatic hyperplasia with urinary obstruction 03/27/2007  . KELOID 03/27/2007  . Mixed hyperlipidemia 01/24/2007  . CARPAL TUNNEL SYNDROME 01/24/2007  . Essential hypertension 01/24/2007  . RENAL CALCULUS 01/24/2007  . Osteoarthritis 01/24/2007   Clarene Critchley PT, DPT 12:00 PM, 03/26/18 [place], [place], [address] Phone: [phone]   Fax:  [phone]  Name: Talor Cheema MRN: [id number] Date of Birth: 1940/08/11

## 2018-03-28 ENCOUNTER — Ambulatory Visit (HOSPITAL_COMMUNITY): Payer: PPO | Admitting: Physical Therapy

## 2018-03-28 ENCOUNTER — Encounter (HOSPITAL_COMMUNITY): Payer: Self-pay | Admitting: Physical Therapy

## 2018-03-28 DIAGNOSIS — M6281 Muscle weakness (generalized): Secondary | ICD-10-CM

## 2018-03-28 DIAGNOSIS — R2689 Other abnormalities of gait and mobility: Secondary | ICD-10-CM

## 2018-03-28 DIAGNOSIS — M25671 Stiffness of right ankle, not elsewhere classified: Secondary | ICD-10-CM

## 2018-03-28 DIAGNOSIS — M25672 Stiffness of left ankle, not elsewhere classified: Secondary | ICD-10-CM | POA: Diagnosis not present

## 2018-03-28 DIAGNOSIS — H1031 Unspecified acute conjunctivitis, right eye: Secondary | ICD-10-CM | POA: Diagnosis not present

## 2018-03-28 NOTE — Patient Instructions (Signed)
Heel Raise (Calf Strength / Balance)    Stand with support, No weights on ankles. Breathe in. Rise up on tiptoes, breathing out through pursed lips. Hold position to count of _3__. Return slowly in 3 seconds, breathing in. Standing with heels lowered off of step  Repeat _15__ times per session. Do_1__ sessions per day. Variation: Do without weights.  Copyright  VHI. All rights reserved.

## 2018-03-28 NOTE — Therapy (Signed)
[place], [place], [address] Phone: [phone]   Fax:  [phone]  Physical Therapy Treatment  Patient Details  Name: Hector Rose MRN: [id number] Date of Birth: May 01, 1940 Referring Provider (PT): Ardeen Jourdain, [place]   Encounter Date: 03/28/2018  PT End of Session - 03/28/18 1313    Visit Number  5    Number of Visits  9    Date for PT Re-Evaluation  04/11/18    Authorization Type  [employer] - Visits based on Medical Necessity    Authorization Time Period  03/14/18 - 04/11/18    Authorization - Visit Number  5    Authorization - Number of Visits  10    PT Start Time  1301    PT Stop Time  1345    PT Time Calculation (min)  44 min    Activity Tolerance  Patient tolerated treatment well    Behavior During Therapy  [facility] for tasks assessed/performed       Past Medical History:  Diagnosis Date  . Asthma 04-11-11   AS CHILD ONLY  . BPH (benign prostatic hypertrophy) with urinary obstruction   . Carpal tunnel syndrome    patient denies at 05/14/17 preop visit   . Difficult intubation 04-11-11   08-11-97 some issues with intubation/record with chart  . DJD (degenerative joint disease)   . DM (diabetes mellitus) ([place]) 04-11-11   tyep II   . DVT femoral (deep venous thrombosis) with thrombophlebitis ([place]) 04-11-11   ?'09-tx. on Xarelton x 3-4 years   . GERD (gastroesophageal reflux disease) 04-11-11   mild, no med in 1 month  . Gout 04-11-11    ankle 2 yrs ago  . Hemorrhoids   . Hypercholesteremia   . Hypertension   . Internal hemorrhoids   . Keloid 04-11-11   multiple-arms,back, chest  . Pulmonary nodule 04-11-11   "PT. NOT AWARE" -denies problems with breathing  . Renal calculus   . Thyroid disease    hypothyroid    Past Surgical History:  Procedure Laterality Date  . cataract surgery  12/2013  . COLONOSCOPY WITH PROPOFOL N/A 02/05/2017   Procedure: COLONOSCOPY WITH PROPOFOL;  Surgeon: Ladene Artist, MD;  [facility];  Service: Endoscopy;  Laterality: N/A;  . COSMETIC SURGERY  2000   Keloid injections  . CYSTOSCOPY WITH BIOPSY  04/14/2011   Procedure: CYSTOSCOPY WITH BIOPSY;  Surgeon: Dutch Gray, MD;  [facility];  Service: Urology;  Laterality: N/A;  Bladder Biopsies   . CYSTOSCOPY/RETROGRADE/URETEROSCOPY  04/14/2011   Procedure: CYSTOSCOPY/RETROGRADE/URETEROSCOPY;  Surgeon: Dutch Gray, MD;  [facility];  Service: Urology;  Laterality: Bilateral;  (Bil) RPG   . LAPAROSCOPIC NEPHRECTOMY Right 05/21/2017   Procedure: LAPAROSCOPIC RADICAL NEPHRECTOMY;  Surgeon: Raynelle Bring, MD;  [facility];  Service: Urology;  Laterality: Right;  . ortho surgery on fingers  2005 & 2008   Dr. Lorelle Formosa finger release  . PROSTATECTOMY  1999   Dr. Rosana Hoes    There were no vitals filed for this visit.  Subjective Assessment - 03/28/18 1310    Subjective  Patient reported that he has not had any leg pain today. He reported that the redness in his right eye he saw the doctor for this morning and that it is not contagious. Patient also reported that he has not noticed any swelling in his ankles today.     Pertinent History  Chronic DVT in left lower extremity  Patient Stated Goals  Decreased pain and discomfort    Currently in Pain?  No/denies                       [facility] Adult PT Treatment/Exercise - 03/28/18 0001      Manual Therapy   Manual Therapy  Joint mobilization    Manual therapy comments  Manual completed separate than rest of tx    Joint Mobilization  Grade III Talocrucal joint mobs to the left LE 4 x 20 seconds      Ankle Exercises: Stretches   Gastroc Stretch  3 reps;30 seconds    Gastroc Stretch Limitations  both legs slant board    Other Stretch  knee drive on 16XW step with AAROM for dorsiflexion 10x 10'' Left and right      Ankle Exercises: Standing   Rocker Board  2 minutes    Rocker Board Limitations  lateral and DF/PF     Heel Raises  15 reps   Concentric, isometric, eccentric on 4'' step with 3'' holds   Toe Raise  15 reps   on slant   Other Standing Ankle Exercises  tandem stance 2x 30" on Airex      Ankle Exercises: Seated   BAPS  Sitting;Level 2;10 reps   DF/PF, Rt/Lt, CW, CCW left & right            PT Education - 03/28/18 1313    Education Details  Discussed purpose and technique of interventions throughout session.     Person(s) Educated  Patient    Methods  Explanation    Comprehension  Verbalized understanding       PT Short Term Goals - 03/14/18 1408      PT SHORT TERM GOAL #1   Title  Patient will demonstrate understanding and report regular compliance with HEP in order to improve ankle ROM, lower extremity strength, and overall functional mobility.     Time  2    Period  Weeks    Status  New    Target Date  03/28/18      PT SHORT TERM GOAL #2   Title  Patient will demonstrate ability to maintain single limb stance on each lower extremity for at least 15 seconds demonstrating improved balance, stability, and safety. 2    Time  2    Period  Weeks    Status  New    Target Date  03/28/18        PT Long Term Goals - 03/14/18 1410      PT LONG TERM GOAL #1   Title  Patient will demonstrate improvement in MMT strength of at least 1/2 MMT strength grade in all deficient muscle groups in order to improve mechanics with ambulation and decrease pain with functional activities.     Time  4    Period  Weeks    Status  New    Target Date  04/11/18      PT LONG TERM GOAL #2   Title  Patient will demonstrate improvement in bilateral ankle AROM of at least 5 degrees in deficient areas to improve mechanics with ambulation and decrease pain with functional activities.     Time  4    Period  Weeks    Status  New    Target Date  04/11/18      PT LONG TERM GOAL #3   Title  Patient will demonstrate ability to ambulate without reports of pain  on 2MWT and with minimal compensations for  improved safety and decreased pain during daily activities.     Time  4    Period  Weeks    Status  New    Target Date  04/11/18            Plan - 03/28/18 1325    Clinical Impression Statement  Patient came in reporting of no pain today or this morning. Patient with noted redness in his right eye. He stated that he is taking drops to address this and that it is not contagious. This session continued with established plan of care including joint mobilizations as patient responded well to these following last session. This session progressed heel raises onto 4'' step. This session also progressed patient with compliant surface balance activities to improve ankle stability and strengthening.      Rehab Potential  Good    Clinical Impairments Affecting Rehab Potential  Positive: Motivated; Negative: Chronicity of leg pain    PT Frequency  2x / week    PT Duration  4 weeks    PT Treatment/Interventions  ADLs/Self Care Home Management;Aquatic Therapy;Electrical Stimulation;DME Instruction;Gait training;Stair training;Functional mobility training;Therapeutic activities;Therapeutic exercise;Balance training;Neuromuscular re-education;Patient/family education;Orthotic Fit/Training;Manual techniques;Passive range of motion;Dry needling;Energy conservation;Taping    PT Next Visit Plan  Continue with bilateral ankle mobility exercises. Talocrural joint mobilizations to improve ankle mobility, consider distraction at next session. Plantarflexion strengthening including eccentric, concentric and isometric phases. Balance activities as able.     PT Home Exercise Plan  03/14/18: Gastroc stretch with towel 3x30 seconds 1-2 x/day; 03/28/18: heel raises on step 15x 1x/day       Patient will benefit from skilled therapeutic intervention in order to improve the following deficits and impairments:  Abnormal gait, Improper body mechanics, Pain, Decreased mobility, Increased muscle spasms, Decreased activity  tolerance, Decreased range of motion, Decreased strength, Hypomobility, Decreased balance, Difficulty walking, Impaired flexibility  Visit Diagnosis: Stiffness of left ankle, not elsewhere classified  Stiffness of right ankle, not elsewhere classified  Muscle weakness (generalized)  Other abnormalities of gait and mobility     Problem List Patient Active Problem List   Diagnosis Date Noted  . S/p nephrectomy 02/04/2018  . Neoplasm of right kidney 05/21/2017  . Vitamin D deficiency 03/28/2017  . Special screening for malignant neoplasms, colon   . Benign neoplasm of transverse colon   . History of DVT (deep vein thrombosis) 07/20/2016  . Renal lesion 07/20/2016  . Type 2 diabetes mellitus with stage 3 chronic kidney disease, with long-term current use of insulin ([place]) 01/15/2014  . Chronic idiopathic gout of multiple sites 01/15/2014  . Calcified pleural plaque on chest x-ray 06/30/2013  . Tinea pedis 09/28/2011  . Hematuria 05/19/2011  . HEMORRHOIDS 05/29/2007  . Pulmonary nodule 03/27/2007  . GERD 03/27/2007  . Benign prostatic hyperplasia with urinary obstruction 03/27/2007  . KELOID 03/27/2007  . Mixed hyperlipidemia 01/24/2007  . CARPAL TUNNEL SYNDROME 01/24/2007  . Essential hypertension 01/24/2007  . RENAL CALCULUS 01/24/2007  . Osteoarthritis 01/24/2007   Clarene Critchley PT, DPT 1:59 PM, 03/28/18 [place], [place], [address] Phone: [phone]   Fax:  [phone]  Name: Manuelito Poage MRN: [id number] Date of Birth: 1940-09-17

## 2018-04-02 ENCOUNTER — Ambulatory Visit (HOSPITAL_COMMUNITY): Payer: PPO | Admitting: Physical Therapy

## 2018-04-02 DIAGNOSIS — M6281 Muscle weakness (generalized): Secondary | ICD-10-CM

## 2018-04-02 DIAGNOSIS — M25671 Stiffness of right ankle, not elsewhere classified: Secondary | ICD-10-CM

## 2018-04-02 DIAGNOSIS — M25672 Stiffness of left ankle, not elsewhere classified: Secondary | ICD-10-CM

## 2018-04-02 DIAGNOSIS — R2689 Other abnormalities of gait and mobility: Secondary | ICD-10-CM

## 2018-04-02 NOTE — Therapy (Signed)
[place], [place], [address] Phone: [phone]   Fax:  [phone]  Physical Therapy Treatment  Patient Details  Name: Hector Rose MRN: [id number] Date of Birth: 1940/03/20 Referring Provider (PT): Ardeen Jourdain, [place]   Encounter Date: 04/02/2018  PT End of Session - 04/02/18 1312    Visit Number  6    Number of Visits  9    Date for PT Re-Evaluation  04/11/18    Authorization Type  [employer] - Visits based on Medical Necessity    Authorization Time Period  03/14/18 - 04/11/18    Authorization - Visit Number  6    Authorization - Number of Visits  10    PT Start Time  1119    PT Stop Time  1200    PT Time Calculation (min)  41 min    Activity Tolerance  Patient tolerated treatment well    Behavior During Therapy  [facility] for tasks assessed/performed       Past Medical History:  Diagnosis Date  . Asthma 04-11-11   AS CHILD ONLY  . BPH (benign prostatic hypertrophy) with urinary obstruction   . Carpal tunnel syndrome    patient denies at 05/14/17 preop visit   . Difficult intubation 04-11-11   08-11-97 some issues with intubation/record with chart  . DJD (degenerative joint disease)   . DM (diabetes mellitus) ([place]) 04-11-11   tyep II   . DVT femoral (deep venous thrombosis) with thrombophlebitis ([place]) 04-11-11   ?'09-tx. on Xarelton x 3-4 years   . GERD (gastroesophageal reflux disease) 04-11-11   mild, no med in 1 month  . Gout 04-11-11    ankle 2 yrs ago  . Hemorrhoids   . Hypercholesteremia   . Hypertension   . Internal hemorrhoids   . Keloid 04-11-11   multiple-arms,back, chest  . Pulmonary nodule 04-11-11   "PT. NOT AWARE" -denies problems with breathing  . Renal calculus   . Thyroid disease    hypothyroid    Past Surgical History:  Procedure Laterality Date  . cataract surgery  12/2013  . COLONOSCOPY WITH PROPOFOL N/A 02/05/2017   Procedure: COLONOSCOPY WITH PROPOFOL;  Surgeon: Ladene Artist, MD;  Location: [facility];  Service: Endoscopy;  Laterality: N/A;  . COSMETIC SURGERY  2000   Keloid injections  . CYSTOSCOPY WITH BIOPSY  04/14/2011   Procedure: CYSTOSCOPY WITH BIOPSY;  Surgeon: Dutch Gray, MD;  Location: [facility];  Service: Urology;  Laterality: N/A;  Bladder Biopsies   . CYSTOSCOPY/RETROGRADE/URETEROSCOPY  04/14/2011   Procedure: CYSTOSCOPY/RETROGRADE/URETEROSCOPY;  Surgeon: Dutch Gray, MD;  Location: [facility];  Service: Urology;  Laterality: Bilateral;  (Bil) RPG   . LAPAROSCOPIC NEPHRECTOMY Right 05/21/2017   Procedure: LAPAROSCOPIC RADICAL NEPHRECTOMY;  Surgeon: Raynelle Bring, MD;  Location: [facility];  Service: Urology;  Laterality: Right;  . ortho surgery on fingers  2005 & 2008   Dr. Lorelle Formosa finger release  . PROSTATECTOMY  1999   Dr. Rosana Hoes    There were no vitals filed for this visit.  Subjective Assessment - 04/02/18 1125    Subjective  Pt states he felt his leg a little when he first got up but overall doing well and currently wtihout pain.     Currently in Pain?  No/denies                       [facility] Adult PT Treatment/Exercise - 04/02/18 0001  Manual Therapy   Manual Therapy  Joint mobilization    Manual therapy comments  Manual completed separate than rest of tx    Joint Mobilization  Grade III Talocrucal joint mobs to the left LE 4 x 20 seconds      Ankle Exercises: Standing   BAPS  Standing;Level 2   A/P only   Vector Stance  Right;Left;5 reps;5 seconds    Rocker Board  2 minutes    Rocker Board Limitations  lateral and DF/PF    Heel Raises  15 reps    Toe Raise  15 reps    Other Standing Ankle Exercises  tandem stance 2x 30" on Airex    Other Standing Ankle Exercises  tandem stance 30"X2 each      Ankle Exercises: Stretches   Plantar Fascia Stretch  3 reps;30 seconds    Gastroc Stretch  3 reps;30 seconds    Gastroc Stretch Limitations  both legs slant board      Ankle Exercises: Seated   BAPS   Sitting;Level 2;10 reps   lateral, CW, CCW              PT Short Term Goals - 03/14/18 1408      PT SHORT TERM GOAL #1   Title  Patient will demonstrate understanding and report regular compliance with HEP in order to improve ankle ROM, lower extremity strength, and overall functional mobility.     Time  2    Period  Weeks    Status  New    Target Date  03/28/18      PT SHORT TERM GOAL #2   Title  Patient will demonstrate ability to maintain single limb stance on each lower extremity for at least 15 seconds demonstrating improved balance, stability, and safety. 2    Time  2    Period  Weeks    Status  New    Target Date  03/28/18        PT Long Term Goals - 03/14/18 1410      PT LONG TERM GOAL #1   Title  Patient will demonstrate improvement in MMT strength of at least 1/2 MMT strength grade in all deficient muscle groups in order to improve mechanics with ambulation and decrease pain with functional activities.     Time  4    Period  Weeks    Status  New    Target Date  04/11/18      PT LONG TERM GOAL #2   Title  Patient will demonstrate improvement in bilateral ankle AROM of at least 5 degrees in deficient areas to improve mechanics with ambulation and decrease pain with functional activities.     Time  4    Period  Weeks    Status  New    Target Date  04/11/18      PT LONG TERM GOAL #3   Title  Patient will demonstrate ability to ambulate without reports of pain on 2MWT and with minimal compensations for improved safety and decreased pain during daily activities.     Time  4    Period  Weeks    Status  New    Target Date  04/11/18            Plan - 04/02/18 1530    Clinical Impression Statement  contiued with focus on improving strength/stability in LE.  Transitioned to standing BAPS with inability to evert foot to move to side; will continue to use in  seated position for lateral and CW/CCW movements.  Began vector stance this session to improve  static stability in LE's as well.  Pt reported overall fatigue at EOS but no pain.  Good mobility achieved with manual at end of session.      Rehab Potential  Good    Clinical Impairments Affecting Rehab Potential  Positive: Motivated; Negative: Chronicity of leg pain    PT Frequency  2x / week    PT Duration  4 weeks    PT Treatment/Interventions  ADLs/Self Care Home Management;Aquatic Therapy;Electrical Stimulation;DME Instruction;Gait training;Stair training;Functional mobility training;Therapeutic activities;Therapeutic exercise;Balance training;Neuromuscular re-education;Patient/family education;Orthotic Fit/Training;Manual techniques;Passive range of motion;Dry needling;Energy conservation;Taping    PT Next Visit Plan  Continue with bilateral ankle mobility exercises. Talocrural joint mobilizations to improve ankle mobility.  Progress balance activities as able.     PT Home Exercise Plan  03/14/18: Gastroc stretch with towel 3x30 seconds 1-2 x/day; 03/28/18: heel raises on step 15x 1x/day       Patient will benefit from skilled therapeutic intervention in order to improve the following deficits and impairments:  Abnormal gait, Improper body mechanics, Pain, Decreased mobility, Increased muscle spasms, Decreased activity tolerance, Decreased range of motion, Decreased strength, Hypomobility, Decreased balance, Difficulty walking, Impaired flexibility  Visit Diagnosis: No diagnosis found.     Problem List Patient Active Problem List   Diagnosis Date Noted  . S/p nephrectomy 02/04/2018  . Neoplasm of right kidney 05/21/2017  . Vitamin D deficiency 03/28/2017  . Special screening for malignant neoplasms, colon   . Benign neoplasm of transverse colon   . History of DVT (deep vein thrombosis) 07/20/2016  . Renal lesion 07/20/2016  . Type 2 diabetes mellitus with stage 3 chronic kidney disease, with long-term current use of insulin ([place]) 01/15/2014  . Chronic idiopathic gout of multiple  sites 01/15/2014  . Calcified pleural plaque on chest x-ray 06/30/2013  . Tinea pedis 09/28/2011  . Hematuria 05/19/2011  . HEMORRHOIDS 05/29/2007  . Pulmonary nodule 03/27/2007  . GERD 03/27/2007  . Benign prostatic hyperplasia with urinary obstruction 03/27/2007  . KELOID 03/27/2007  . Mixed hyperlipidemia 01/24/2007  . CARPAL TUNNEL SYNDROME 01/24/2007  . Essential hypertension 01/24/2007  . RENAL CALCULUS 01/24/2007  . Osteoarthritis 01/24/2007   Teena Irani, PTA/CLT [phone]  Teena Irani 04/02/2018, 3:35 PM  [facility] [address] [place], [place], [address] Phone: [phone]   Fax:  [phone]  Name: Howell Groesbeck MRN: [id number] Date of Birth: 1941/03/11

## 2018-04-04 ENCOUNTER — Ambulatory Visit (HOSPITAL_COMMUNITY): Payer: PPO | Admitting: Physical Therapy

## 2018-04-04 ENCOUNTER — Encounter (HOSPITAL_COMMUNITY): Payer: Self-pay | Admitting: Physical Therapy

## 2018-04-04 DIAGNOSIS — M25672 Stiffness of left ankle, not elsewhere classified: Secondary | ICD-10-CM

## 2018-04-04 DIAGNOSIS — M6281 Muscle weakness (generalized): Secondary | ICD-10-CM

## 2018-04-04 DIAGNOSIS — M25671 Stiffness of right ankle, not elsewhere classified: Secondary | ICD-10-CM

## 2018-04-04 DIAGNOSIS — R2689 Other abnormalities of gait and mobility: Secondary | ICD-10-CM

## 2018-04-04 NOTE — Therapy (Signed)
[place], [place], [address] Phone: [phone]   Fax:  [phone]  Physical Therapy Treatment  Patient Details  Name: Hector Rose MRN: [id number] Date of Birth: Jun 30, 1940 Referring Provider (PT): Ardeen Jourdain, [place]   Encounter Date: 04/04/2018  PT End of Session - 04/04/18 1303    Visit Number  7    Number of Visits  9    Date for PT Re-Evaluation  04/11/18    Authorization Type  [employer] - Visits based on Medical Necessity    Authorization Time Period  03/14/18 - 04/11/18    Authorization - Visit Number  7    Authorization - Number of Visits  10    PT Start Time  1300    PT Stop Time  1338    PT Time Calculation (min)  38 min    Activity Tolerance  Patient tolerated treatment well    Behavior During Therapy  [facility] for tasks assessed/performed       Past Medical History:  Diagnosis Date  . Asthma 04-11-11   AS CHILD ONLY  . BPH (benign prostatic hypertrophy) with urinary obstruction   . Carpal tunnel syndrome    patient denies at 05/14/17 preop visit   . Difficult intubation 04-11-11   08-11-97 some issues with intubation/record with chart  . DJD (degenerative joint disease)   . DM (diabetes mellitus) ([place]) 04-11-11   tyep II   . DVT femoral (deep venous thrombosis) with thrombophlebitis ([place]) 04-11-11   ?'09-tx. on Xarelton x 3-4 years   . GERD (gastroesophageal reflux disease) 04-11-11   mild, no med in 1 month  . Gout 04-11-11    ankle 2 yrs ago  . Hemorrhoids   . Hypercholesteremia   . Hypertension   . Internal hemorrhoids   . Keloid 04-11-11   multiple-arms,back, chest  . Pulmonary nodule 04-11-11   "PT. NOT AWARE" -denies problems with breathing  . Renal calculus   . Thyroid disease    hypothyroid    Past Surgical History:  Procedure Laterality Date  . cataract surgery  12/2013  . COLONOSCOPY WITH PROPOFOL N/A 02/05/2017   Procedure: COLONOSCOPY WITH PROPOFOL;  Surgeon: Ladene Artist, MD;  [facility];  Service: Endoscopy;  Laterality: N/A;  . COSMETIC SURGERY  2000   Keloid injections  . CYSTOSCOPY WITH BIOPSY  04/14/2011   Procedure: CYSTOSCOPY WITH BIOPSY;  Surgeon: Dutch Gray, MD;  [facility];  Service: Urology;  Laterality: N/A;  Bladder Biopsies   . CYSTOSCOPY/RETROGRADE/URETEROSCOPY  04/14/2011   Procedure: CYSTOSCOPY/RETROGRADE/URETEROSCOPY;  Surgeon: Dutch Gray, MD;  [facility];  Service: Urology;  Laterality: Bilateral;  (Bil) RPG   . LAPAROSCOPIC NEPHRECTOMY Right 05/21/2017   Procedure: LAPAROSCOPIC RADICAL NEPHRECTOMY;  Surgeon: Raynelle Bring, MD;  [facility];  Service: Urology;  Laterality: Right;  . ortho surgery on fingers  2005 & 2008   Dr. Lorelle Formosa finger release  . PROSTATECTOMY  1999   Dr. Rosana Hoes    There were no vitals filed for this visit.  Subjective Assessment - 04/04/18 1302    Subjective  Patient stated that he is not having any pain currently and has not had any pain the past couple days.     Currently in Pain?  No/denies                       [facility] Adult PT Treatment/Exercise - 04/04/18 0001  Ankle Exercises: Stretches   Plantar Fascia Stretch  3 reps;30 seconds;Other (comment)   Left   Gastroc Stretch  3 reps;30 seconds    Gastroc Stretch Limitations  both legs slant board      Ankle Exercises: Standing   BAPS  Standing;Level 2   A/P only   Rocker Board  2 minutes    Rocker Board Limitations  lateral and DF/PF    Heel Raises  20 reps   On step with eccentric, isometric, and concentric phase   Toe Raise  20 reps    Other Standing Ankle Exercises  Sidestepping with RTB around calves 15 feet x 2 roundtrips. Tandem stance on foam bilateral UE flexion x 20 with each LE forward      Ankle Exercises: Seated   BAPS  Sitting;Level 2;10 reps   Lateral, CW, CCW left LE            PT Education - 04/04/18 1302    Education Details  Discussed purpose and technique of  interventions throughout session.     Person(s) Educated  Patient    Methods  Explanation    Comprehension  Verbalized understanding       PT Short Term Goals - 03/14/18 1408      PT SHORT TERM GOAL #1   Title  Patient will demonstrate understanding and report regular compliance with HEP in order to improve ankle ROM, lower extremity strength, and overall functional mobility.     Time  2    Period  Weeks    Status  New    Target Date  03/28/18      PT SHORT TERM GOAL #2   Title  Patient will demonstrate ability to maintain single limb stance on each lower extremity for at least 15 seconds demonstrating improved balance, stability, and safety. 2    Time  2    Period  Weeks    Status  New    Target Date  03/28/18        PT Long Term Goals - 03/14/18 1410      PT LONG TERM GOAL #1   Title  Patient will demonstrate improvement in MMT strength of at least 1/2 MMT strength grade in all deficient muscle groups in order to improve mechanics with ambulation and decrease pain with functional activities.     Time  4    Period  Weeks    Status  New    Target Date  04/11/18      PT LONG TERM GOAL #2   Title  Patient will demonstrate improvement in bilateral ankle AROM of at least 5 degrees in deficient areas to improve mechanics with ambulation and decrease pain with functional activities.     Time  4    Period  Weeks    Status  New    Target Date  04/11/18      PT LONG TERM GOAL #3   Title  Patient will demonstrate ability to ambulate without reports of pain on 2MWT and with minimal compensations for improved safety and decreased pain during daily activities.     Time  4    Period  Weeks    Status  New    Target Date  04/11/18            Plan - 04/04/18 1314    Clinical Impression Statement  This session continued with established plan of care. Patient without reports of pain this session. Patient continued to demonstrate some  difficulty with BAPS board this session,  however improved from initial use if the [employer]. Added tandem ambulation this session to improve patient's ankle strength and balance challenge. Patient would benefit from skilled physical therapy in order to continue progressing towards functional goals.     Rehab Potential  Good    Clinical Impairments Affecting Rehab Potential  Positive: Motivated; Negative: Chronicity of leg pain    PT Frequency  2x / week    PT Duration  4 weeks    PT Treatment/Interventions  ADLs/Self Care Home Management;Aquatic Therapy;Electrical Stimulation;DME Instruction;Gait training;Stair training;Functional mobility training;Therapeutic activities;Therapeutic exercise;Balance training;Neuromuscular re-education;Patient/family education;Orthotic Fit/Training;Manual techniques;Passive range of motion;Dry needling;Energy conservation;Taping    PT Next Visit Plan  Continue with bilateral ankle mobility exercises. Talocrural joint mobilizations to improve ankle mobility.  Progress balance activities as able.     PT Home Exercise Plan  03/14/18: Gastroc stretch with towel 3x30 seconds 1-2 x/day; 03/28/18: heel raises on step 15x 1x/day       Patient will benefit from skilled therapeutic intervention in order to improve the following deficits and impairments:  Abnormal gait, Improper body mechanics, Pain, Decreased mobility, Increased muscle spasms, Decreased activity tolerance, Decreased range of motion, Decreased strength, Hypomobility, Decreased balance, Difficulty walking, Impaired flexibility  Visit Diagnosis: Stiffness of left ankle, not elsewhere classified  Stiffness of right ankle, not elsewhere classified  Muscle weakness (generalized)  Other abnormalities of gait and mobility     Problem List Patient Active Problem List   Diagnosis Date Noted  . S/p nephrectomy 02/04/2018  . Neoplasm of right kidney 05/21/2017  . Vitamin D deficiency 03/28/2017  . Special screening for malignant neoplasms, colon   .  Benign neoplasm of transverse colon   . History of DVT (deep vein thrombosis) 07/20/2016  . Renal lesion 07/20/2016  . Type 2 diabetes mellitus with stage 3 chronic kidney disease, with long-term current use of insulin ([place]) 01/15/2014  . Chronic idiopathic gout of multiple sites 01/15/2014  . Calcified pleural plaque on chest x-ray 06/30/2013  . Tinea pedis 09/28/2011  . Hematuria 05/19/2011  . HEMORRHOIDS 05/29/2007  . Pulmonary nodule 03/27/2007  . GERD 03/27/2007  . Benign prostatic hyperplasia with urinary obstruction 03/27/2007  . KELOID 03/27/2007  . Mixed hyperlipidemia 01/24/2007  . CARPAL TUNNEL SYNDROME 01/24/2007  . Essential hypertension 01/24/2007  . RENAL CALCULUS 01/24/2007  . Osteoarthritis 01/24/2007   Clarene Critchley PT, DPT 1:41 PM, 04/04/18 [place], [place], [address] Phone: [phone]   Fax:  [phone]  Name: Hector Rose MRN: [id number] Date of Birth: 05-Nov-1940

## 2018-04-07 DIAGNOSIS — J069 Acute upper respiratory infection, unspecified: Secondary | ICD-10-CM | POA: Diagnosis not present

## 2018-04-07 DIAGNOSIS — E119 Type 2 diabetes mellitus without complications: Secondary | ICD-10-CM | POA: Diagnosis not present

## 2018-04-10 ENCOUNTER — Ambulatory Visit (HOSPITAL_COMMUNITY): Payer: PPO | Admitting: Physical Therapy

## 2018-04-10 ENCOUNTER — Telehealth (HOSPITAL_COMMUNITY): Payer: Self-pay | Admitting: Family Medicine

## 2018-04-10 NOTE — Telephone Encounter (Signed)
04/10/18  pt cx and does want to reschedule.   he has the flu

## 2018-04-12 ENCOUNTER — Ambulatory Visit (HOSPITAL_COMMUNITY): Payer: PPO | Admitting: Physical Therapy

## 2018-04-15 ENCOUNTER — Encounter: Payer: Self-pay | Admitting: "Endocrinology

## 2018-04-15 ENCOUNTER — Ambulatory Visit (INDEPENDENT_AMBULATORY_CARE_PROVIDER_SITE_OTHER): Payer: PPO | Admitting: "Endocrinology

## 2018-04-15 VITALS — BP 150/78 | HR 61 | Ht 73.0 in | Wt 222.0 lb

## 2018-04-15 DIAGNOSIS — N183 Chronic kidney disease, stage 3 unspecified: Secondary | ICD-10-CM

## 2018-04-15 DIAGNOSIS — I10 Essential (primary) hypertension: Secondary | ICD-10-CM

## 2018-04-15 DIAGNOSIS — E782 Mixed hyperlipidemia: Secondary | ICD-10-CM | POA: Diagnosis not present

## 2018-04-15 DIAGNOSIS — Z794 Long term (current) use of insulin: Secondary | ICD-10-CM | POA: Diagnosis not present

## 2018-04-15 DIAGNOSIS — E1122 Type 2 diabetes mellitus with diabetic chronic kidney disease: Secondary | ICD-10-CM | POA: Diagnosis not present

## 2018-04-15 MED ORDER — INSULIN NPH ISOPHANE & REGULAR (70-30) 100 UNIT/ML ~~LOC~~ SUSP: 30.0000 [IU] | Freq: Two times a day (BID) | SUBCUTANEOUS | 2 refills | Status: DC

## 2018-04-15 NOTE — Progress Notes (Signed)
Endocrinology follow-up note   Subjective:    Patient ID: Hector Rose, male    DOB: [birth year],    Past Medical History:  Diagnosis Date  . Asthma 04-11-11   AS CHILD ONLY  . BPH (benign prostatic hypertrophy) with urinary obstruction   . Carpal tunnel syndrome    patient denies at 05/14/17 preop visit   . Difficult intubation 04-11-11   08-11-97 some issues with intubation/record with chart  . DJD (degenerative joint disease)   . DM (diabetes mellitus) ([place]) 04-11-11   tyep II   . DVT femoral (deep venous thrombosis) with thrombophlebitis ([place]) 04-11-11   ?'09-tx. on Xarelton x 3-4 years   . GERD (gastroesophageal reflux disease) 04-11-11   mild, no med in 1 month  . Gout 04-11-11    ankle 2 yrs ago  . Hemorrhoids   . Hypercholesteremia   . Hypertension   . Internal hemorrhoids   . Keloid 04-11-11   multiple-arms,back, chest  . Pulmonary nodule 04-11-11   "PT. NOT AWARE" -denies problems with breathing  . Renal calculus   . Thyroid disease    hypothyroid   Past Surgical History:  Procedure Laterality Date  . cataract surgery  12/2013  . COLONOSCOPY WITH PROPOFOL N/A 02/05/2017   Procedure: COLONOSCOPY WITH PROPOFOL;  Surgeon: Ladene Artist, MD;  Location: [facility];  Service: Endoscopy;  Laterality: N/A;  . COSMETIC SURGERY  2000   Keloid injections  . CYSTOSCOPY WITH BIOPSY  04/14/2011   Procedure: CYSTOSCOPY WITH BIOPSY;  Surgeon: Dutch Gray, MD;  Location: [facility];  Service: Urology;  Laterality: N/A;  Bladder Biopsies   . CYSTOSCOPY/RETROGRADE/URETEROSCOPY  04/14/2011   Procedure: CYSTOSCOPY/RETROGRADE/URETEROSCOPY;  Surgeon: Dutch Gray, MD;  Location: [facility];  Service: Urology;  Laterality: Bilateral;  (Bil) RPG   . LAPAROSCOPIC NEPHRECTOMY Right 05/21/2017   Procedure: LAPAROSCOPIC RADICAL NEPHRECTOMY;  Surgeon: Raynelle Bring, MD;  Location: [facility];  Service: Urology;  Laterality: Right;  . ortho surgery on fingers  2005 & 2008   Dr. Lorelle Formosa finger release   . PROSTATECTOMY  1999   Dr. Rosana Hoes   Social History   Socioeconomic History  . Marital status: Married    Spouse name: Not on file  . Number of children: 2  . Years of education: Not on file  . Highest education level: Not on file  Occupational History  . Occupation: retired from [employer]  . Financial resource strain: Not on file  . Food insecurity:    Worry: Not on file    Inability: Not on file  . Transportation needs:    Medical: Not on file    Non-medical: Not on file  Tobacco Use  . Smoking status: Never Smoker  . Smokeless tobacco: Never Used  Substance and Sexual Activity  . Alcohol use: No    Alcohol/week: 0.0 standard drinks  . Drug use: No  . Sexual activity: Yes  Lifestyle  . Physical activity:    Days per week: Not on file    Minutes per session: Not on file  . Stress: Not on file  Relationships  . Social connections:    Talks on phone: Not on file    Gets together: Not on file    Attends religious service: Not on file    Active member of club or organization: Not on file    Attends meetings of clubs or organizations: Not on file    Relationship status: Not on file  Other Topics Concern  .  Not on file  Social History Narrative  . Not on file   Outpatient Encounter Medications as of 04/15/2018  Medication Sig  . allopurinol (ZYLOPRIM) 300 MG tablet TAKE 1 TABLET BY MOUTH ONCE DAILY  . amLODipine (NORVASC) 10 MG tablet TAKE 1 TABLET EVERY MORNING  . BD VEO INSULIN SYR ULTRAFINE 31G X 15/64" 1 ML MISC USE TWICE A DAY AS DIRECTED  . [facility] D3 5000 units capsule TAKE 1 CAPSULE BY MOUTH DAILY  . diphenhydrAMINE (BENADRYL) 25 MG tablet Take 25 mg by mouth daily as needed for allergies.  . fluticasone (CUTIVATE) 0.05 % cream Apply 1 application 2 (two) times a week topically. To face for itching and flaking  . fluticasone (FLONASE) 50 MCG/ACT nasal spray Place 2 sprays daily as needed into both nostrils for allergies or rhinitis.  Marland Kitchen gabapentin  (NEURONTIN) 600 MG tablet Take 600-1,200 mg by mouth See admin instructions. Take 1200 mg in the morning and 600 mg at night  . insulin NPH-regular Human (NOVOLIN 70/30) (70-30) 100 UNIT/ML injection Inject 30 Units into the skin 2 (two) times daily with a meal.  . metoprolol (LOPRESSOR) 50 MG tablet TAKE 1 TABLET TWICE DAILY  . ONE TOUCH ULTRA TEST test strip USE TO TEST BLOOD SUGAR TWICE DAILY.  Marland Kitchen ONETOUCH DELICA LANCETS 17G MISC USE TO TEST BLOOD SUGAR TWICE DAILY.  Marland Kitchen Propylene Glycol (SYSTANE BALANCE OP) Apply 1 drop as needed to eye (irritation).  . Rivaroxaban (XARELTO) 15 MG TABS tablet Take 15 mg by mouth daily.   . rosuvastatin (CRESTOR) 20 MG tablet Take 1 tablet (20 mg total) by mouth daily.  . [DISCONTINUED] NOVOLIN 70/30 (70-30) 100 UNIT/ML injection INJECT 40 UNITS INTO THE SKIN 2 TIMES A DAY BEFORE MEAL INJECT 40 UNITS BREAKFAST& 30 UNITS SUPPER (Patient not taking: Reported on 04/15/2018)  . [DISCONTINUED] simvastatin (ZOCOR) 40 MG tablet Takes 1/2 tablet daily bedtime   No facility-administered encounter medications on file as of 04/15/2018.    ALLERGIES: Allergies  Allergen Reactions  . Ace Inhibitors Swelling    REACTION: angio edema (swelling of lips)  . Metformin And Related     Itching in higher doses    VACCINATION STATUS: Immunization History  Administered Date(s) Administered  . Influenza Split 11/27/2011, 12/30/2012, 01/01/2014, 03/13/2017  . Influenza Whole 01/27/2009, 11/30/2009, 11/17/2010  . Influenza, High Dose Seasonal PF 12/21/2015, 01/04/2018  . Pneumococcal Conjugate-13 02/04/2018  . Pneumococcal Polysaccharide-23 12/02/2008    Diabetes  He presents for his follow-up diabetic visit. He has type 2 diabetes mellitus. Onset time: Diagnosed approximately  at age 15. His disease course has been stable.  -He came today before his scheduled appointment with a complaint of one episode of confusion on January 31 early in the morning associated with sweating,  hallucination, incoherent speech.  His wife gave him peanut butter, juice, and slowly got better.  She did not measure his blood glucose before intervention.  His meter review did not document any hypoglycemia episodes.  His average blood glucose is 177 over the last 14 days, 210 over the last 7 days since he stopped taking his insulin.  Pertinent negatives for diabetes include no chest pain, no fatigue, no polydipsia, no polyphagia, no polyuria and no weakness. Symptoms are stable. Risk factors for coronary artery disease include diabetes mellitus, dyslipidemia, male sex, hypertension and sedentary lifestyle. Current diabetic treatment includes insulin injections. His weight is fluctuating minimally. He is following a generally unhealthy diet. He participates in exercise intermittently.   Hypertension  This is a chronic problem. The current episode started more than 1 year ago. The problem is controlled. Pertinent negatives include no chest pain, headaches, neck pain, palpitations or shortness of breath. Past treatments include calcium channel blockers.  Hyperlipidemia  This is a chronic problem. The current episode started more than 1 year ago. The problem is uncontrolled. Pertinent negatives include no chest pain, myalgias or shortness of breath. Current antihyperlipidemic treatment includes bile acid squestrants. Risk factors for coronary artery disease include dyslipidemia and diabetes mellitus.     Review of Systems  Constitutional: Negative for fatigue and unexpected weight change.  HENT: Negative for dental problem, mouth sores and trouble swallowing.   Eyes: Negative for visual disturbance.  Respiratory: Negative for cough, choking, chest tightness, shortness of breath and wheezing.   Cardiovascular: Negative for chest pain, palpitations and leg swelling.  Gastrointestinal: Negative for abdominal distention, abdominal pain, constipation, diarrhea, nausea and vomiting.  Endocrine: Negative  for polydipsia, polyphagia and polyuria.  Genitourinary: Negative for dysuria, flank pain, hematuria and urgency.  Musculoskeletal: Negative for back pain, gait problem, joint swelling, myalgias and neck pain.  Skin: Negative for pallor, rash and wound.  Neurological: Negative for seizures, syncope, weakness, numbness and headaches.  Psychiatric/Behavioral: Negative for confusion, dysphoric mood, hallucinations and suicidal ideas.    Objective:    BP (!) 150/78 Comment: Pt has not had BP meds this a.m  Pulse 61   Ht 6\' 1"  (1.854 m)   Wt 222 lb (100.7 kg)   BMI 29.29 kg/m   Wt Readings from Last 3 Encounters:  04/15/18 222 lb (100.7 kg)  03/21/18 225 lb (102.1 kg)  02/25/18 229 lb (103.9 kg)    Physical Exam  Constitutional: He is oriented to person, place, and time. He appears well-developed. He is cooperative.  HENT:  Head: Normocephalic and atraumatic.  Eyes: EOM are normal.  Neck: Normal range of motion. Neck supple. No tracheal deviation present. No thyromegaly present.  Cardiovascular: Normal rate. Exam reveals no gallop.  No murmur heard. Pulses:      Carotid pulses are 2+ on the right side.      Radial pulses are 2+ on the right side.       Dorsalis pedis pulses are 2+ on the right side.       Posterior tibial pulses are 2+ on the right side.  Pulmonary/Chest: Effort normal. No respiratory distress. He has no wheezes.  Abdominal: Bowel sounds are normal. He exhibits no distension. There is no hepatosplenomegaly. There is no abdominal tenderness. There is no guarding and no CVA tenderness.  Musculoskeletal:        General: No edema.     Right shoulder: He exhibits no swelling and no deformity.   Neurological: He is alert and oriented to person, place, and time. He has normal strength and normal reflexes. No cranial nerve deficit or sensory deficit. Gait normal.  Skin: Skin is warm and dry. No rash noted. No cyanosis. Nails show no clubbing.  Psychiatric: He has a  normal mood and affect. His speech is normal. Judgment normal. Cognition and memory are normal.   Recent Results (from the past 2160 hour(s))  TSH     Status: None   Collection Time: 01/30/18  9:24 AM  Result Value Ref Range   TSH 2.62 0.35 - 4.50 uIU/mL  CBC w/Diff     Status: None   Collection Time: 01/30/18  9:24 AM  Result Value Ref Range   WBC 7.7 4.0 -  10.5 K/uL   RBC 4.55 4.22 - 5.81 Mil/uL   Hemoglobin 14.6 13.0 - 17.0 g/dL   HCT 42.3 39.0 - 52.0 %   MCV 93.0 78.0 - 100.0 fl   MCHC 34.4 30.0 - 36.0 g/dL   RDW 13.9 11.5 - 15.5 %   Platelets 153.0 150.0 - 400.0 K/uL   Neutrophils Relative % 65.6 43.0 - 77.0 %   Lymphocytes Relative 21.3 12.0 - 46.0 %   Monocytes Relative 9.3 3.0 - 12.0 %   Eosinophils Relative 3.2 0.0 - 5.0 %   Basophils Relative 0.6 0.0 - 3.0 %   Neutro Abs 5.1 1.4 - 7.7 K/uL   Lymphs Abs 1.6 0.7 - 4.0 K/uL   Monocytes Absolute 0.7 0.1 - 1.0 K/uL   Eosinophils Absolute 0.2 0.0 - 0.7 K/uL   Basophils Absolute 0.0 0.0 - 0.1 K/uL  HgB A1c     Status: Abnormal   Collection Time: 01/30/18  9:24 AM  Result Value Ref Range   Hgb A1c MFr Bld 7.3 (H) 4.6 - 6.5 %    Comment: Glycemic Control Guidelines for People with Diabetes:Non Diabetic:  <6%Goal of Therapy: <7%Additional Action Suggested:  >8%   Basic Metabolic Panel (BMET)     Status: Abnormal   Collection Time: 01/30/18  9:24 AM  Result Value Ref Range   Sodium 140 135 - 145 mEq/L   Potassium 4.3 3.5 - 5.1 mEq/L   Chloride 103 96 - 112 mEq/L   CO2 30 19 - 32 mEq/L   Glucose, Bld 87 70 - 99 mg/dL   BUN 18 6 - 23 mg/dL   Creatinine, Ser 2.05 (H) 0.40 - 1.50 mg/dL   Calcium 9.7 8.4 - 10.5 mg/dL   GFR 40.73 (L) >60.00 mL/min  Hemoglobin A1c     Status: Abnormal   Collection Time: 02/21/18  8:30 AM  Result Value Ref Range   Hgb A1c MFr Bld 7.3 (H) <5.7 % of total Hgb    Comment: For someone without known diabetes, a hemoglobin A1c value of 6.5% or greater indicates that they may have  diabetes and  this should be confirmed with a follow-up  test. . For someone with known diabetes, a value <7% indicates  that their diabetes is well controlled and a value  greater than or equal to 7% indicates suboptimal  control. A1c targets should be individualized based on  duration of diabetes, age, comorbid conditions, and  other considerations. . Currently, no consensus exists regarding use of hemoglobin A1c for diagnosis of diabetes for children. .    Mean Plasma Glucose 163 (calc)   eAG (mmol/L) 9.0 (calc)  COMPLETE METABOLIC PANEL WITH GFR     Status: Abnormal   Collection Time: 02/21/18  8:30 AM  Result Value Ref Range   Glucose, Bld 153 (H) 65 - 139 mg/dL    Comment: .        Non-fasting reference interval .    BUN 20 7 - 25 mg/dL   Creat 2.17 (H) 0.70 - 1.18 mg/dL    Comment: For patients >11 years of age, the reference limit for Creatinine is approximately 13% higher for people identified as African-American. .    GFR, Est Non African American 29 (L) > OR = 60 mL/min/1.15m2   GFR, Est African American 33 (L) > OR = 60 mL/min/1.76m2   BUN/Creatinine Ratio 9 6 - 22 (calc)   Sodium 143 135 - 146 mmol/L   Potassium 5.4 (H) 3.5 - 5.3  mmol/L   Chloride 105 98 - 110 mmol/L   CO2 33 (H) 20 - 32 mmol/L   Calcium 9.6 8.6 - 10.3 mg/dL   Total Protein 7.1 6.1 - 8.1 g/dL   Albumin 4.2 3.6 - 5.1 g/dL   Globulin 2.9 1.9 - 3.7 g/dL (calc)   AG Ratio 1.4 1.0 - 2.5 (calc)   Total Bilirubin 0.6 0.2 - 1.2 mg/dL   Alkaline phosphatase (APISO) 72 40 - 115 U/L   AST 24 10 - 35 U/L   ALT 19 9 - 46 U/L   Lipid Panel     Component Value Date/Time   CHOL 222 (H) 11/20/2017 0745   TRIG 118 11/20/2017 0745   HDL 46 11/20/2017 0745   CHOLHDL 4.8 11/20/2017 0745   VLDL 28 03/17/2016 0714   LDLCALC 152 (H) 11/20/2017 0745   LDLDIRECT 147.5 06/10/2012 0930     Assessment & Plan:   1. Type 2 diabetes mellitus with stage 2 chronic kidney disease He came with prior to his scheduled  appointment with a complaint of possible hypoglycemia causing symptoms.  He is recent A1c was 7.3%.    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     Patient is advised to stick to a routine mealtimes to eat 3 meals  a day and avoid unnecessary snacks ( to snack only to correct hypoglycemia).  - Patient admits there is a room for improvement in his diet and drink choices. -  Suggestion is made for him to avoid simple carbohydrates  from his diet including Cakes, Sweet Desserts / Pastries, Ice Cream, Soda (diet and regular), Sweet Tea, Candies, Chips, Cookies, Store Bought Juices, Alcohol in Excess of  1-2 drinks a day, Artificial Sweeteners, and "Sugar-free" Products. This will help patient to have stable blood glucose profile and potentially avoid unintended weight gain.  -Even though there was no documented hypoglycemia, in this patient prior to #1 is to avoid hypoglycemia.   -Since insulin is his only option to treat his diabetes, I approach him to lower the dose.  He agrees to lower Novolin 70/30  To 30 units with breakfast and 30 units with supper, associated with monitoring of blood glucose 4 times a day- before meals and at bedtime as well as as needed at any time.   - Patient is instructed to call back with extremes of blood glucose readings  less than 70 or greater than 300 mg/dl. -He is not a candidate for metformin, SGLT2 inhibitors, nor incretin therapy.  2. Mixed hyperlipidemia - His repeat lipid panel showed significant loss of control to LDL of 166.  He is tolerating Crestor better than pravastatin.  He is advised to continue Crestor 20 mg p.o. nightly.  - He is advised to continue with omega-3 fatty acids ,avoid butter and fried food and exercise regularly.  3. Essential hypertension  His blood pressure is not controlled to target.   He is advised to continue  amlodipine.  4) vitamin D deficiency -He is on ongoing supplement with  vitamin D 3 5000 units daily for the next 90 days. - Time spent with the patient: 25 min, of which >50% was spent in reviewing his blood glucose logs , discussing his hypoglycemia and hyperglycemia episodes, reviewing his current and  previous labs / studies and medications  doses and developing a plan to avoid hypoglycemia and hyperglycemia. Please refer to Patient Instructions  for Blood Glucose Monitoring and Insulin/Medications Dosing Guide"  in media tab for additional information. Grace Blight participated in the discussions, expressed understanding, and voiced agreement with the above plans.  All questions were answered to his satisfaction. he is encouraged to contact clinic should he have any questions or concerns prior to his return visit.  Follow up plan: Return Keep his regular appointment.  Glade Lloyd, MD Phone: [phone]  Fax: [phone]  -  This note was partially dictated with voice recognition software. Similar sounding words can be transcribed inadequately or may not  be corrected upon review.  04/15/2018, 12:54 PMCBG 147 fasting this a.m. per patient

## 2018-04-22 ENCOUNTER — Telehealth (HOSPITAL_COMMUNITY): Payer: Self-pay | Admitting: Family Medicine

## 2018-04-22 NOTE — Telephone Encounter (Signed)
04/22/18  I left patient a message.... he didn't have anymore appts schedule and still had 2 more that he could schedule.  I asked that he call us back if he would like to schedule those or be discharged

## 2018-04-29 ENCOUNTER — Other Ambulatory Visit: Payer: Self-pay | Admitting: "Endocrinology

## 2018-04-30 ENCOUNTER — Encounter (HOSPITAL_COMMUNITY): Payer: Self-pay | Admitting: Physical Therapy

## 2018-04-30 ENCOUNTER — Ambulatory Visit (HOSPITAL_COMMUNITY): Payer: PPO | Attending: Orthopedic Surgery | Admitting: Physical Therapy

## 2018-04-30 DIAGNOSIS — R2689 Other abnormalities of gait and mobility: Secondary | ICD-10-CM | POA: Insufficient documentation

## 2018-04-30 DIAGNOSIS — M25671 Stiffness of right ankle, not elsewhere classified: Secondary | ICD-10-CM | POA: Insufficient documentation

## 2018-04-30 DIAGNOSIS — M6281 Muscle weakness (generalized): Secondary | ICD-10-CM | POA: Diagnosis not present

## 2018-04-30 DIAGNOSIS — M25672 Stiffness of left ankle, not elsewhere classified: Secondary | ICD-10-CM | POA: Diagnosis not present

## 2018-04-30 NOTE — Therapy (Addendum)
[facility] [address] [place], [place], [address] Phone: [phone]   Fax:  [phone]  Physical Therapy Treatment / Progress Note / Discharge Summary  Patient Details  Name: Hector Rose MRN: [id number] Date of Birth: December 23, 1940 Referring Provider (PT): Ardeen Jourdain, [place]   Encounter Date: 04/30/2018   Progress Note Reporting Period 03/14/18 to 04/30/18  See note below for Objective Data and Assessment of Progress/Goals.       PHYSICAL THERAPY DISCHARGE SUMMARY  Visits from Start of Care: 8  Current functional level related to goals / functional outcomes: See below   Remaining deficits: See below   Education / Equipment: Educated on HEP and benefits of therapy see below Plan: Patient agrees to discharge.  Patient goals were met. Patient is being discharged due to meeting the stated rehab goals.  ?????          PT End of Session - 04/30/18 1434    Visit Number  8    Number of Visits  9    Date for PT Re-Evaluation  04/11/18    Authorization Type  [employer] - Visits based on Medical Necessity    Authorization Time Period  03/14/18 - 04/11/18    Authorization - Visit Number  8    Authorization - Number of Visits  10    PT Start Time  2536    PT Stop Time  1502    PT Time Calculation (min)  31 min    Activity Tolerance  Patient tolerated treatment well    Behavior During Therapy  WFL for tasks assessed/performed       Past Medical History:  Diagnosis Date  . Asthma 04-11-11   AS CHILD ONLY  . BPH (benign prostatic hypertrophy) with urinary obstruction   . Carpal tunnel syndrome    patient denies at 05/14/17 preop visit   . Difficult intubation 04-11-11   08-11-97 some issues with intubation/record with chart  . DJD (degenerative joint disease)   . DM (diabetes mellitus) ([place]) 04-11-11   tyep II   . DVT femoral (deep venous thrombosis) with thrombophlebitis ([place]) 04-11-11   ?'09-tx. on Xarelton x  3-4 years   . GERD (gastroesophageal reflux disease) 04-11-11   mild, no med in 1 month  . Gout 04-11-11    ankle 2 yrs ago  . Hemorrhoids   . Hypercholesteremia   . Hypertension   . Internal hemorrhoids   . Keloid 04-11-11   multiple-arms,back, chest  . Pulmonary nodule 04-11-11   "PT. NOT AWARE" -denies problems with breathing  . Renal calculus   . Thyroid disease    hypothyroid    Past Surgical History:  Procedure Laterality Date  . cataract surgery  12/2013  . COLONOSCOPY WITH PROPOFOL N/A 02/05/2017   Procedure: COLONOSCOPY WITH PROPOFOL;  Surgeon: Ladene Artist, MD;  Location: [facility];  Service: Endoscopy;  Laterality: N/A;  . COSMETIC SURGERY  2000   Keloid injections  . CYSTOSCOPY WITH BIOPSY  04/14/2011   Procedure: CYSTOSCOPY WITH BIOPSY;  Surgeon: Dutch Gray, MD;  Location: [facility];  Service: Urology;  Laterality: N/A;  Bladder Biopsies   . CYSTOSCOPY/RETROGRADE/URETEROSCOPY  04/14/2011   Procedure: CYSTOSCOPY/RETROGRADE/URETEROSCOPY;  Surgeon: Dutch Gray, MD;  Location: [facility];  Service: Urology;  Laterality: Bilateral;  (Bil) RPG   . LAPAROSCOPIC NEPHRECTOMY Right 05/21/2017   Procedure: LAPAROSCOPIC RADICAL NEPHRECTOMY;  Surgeon: Raynelle Bring, MD;  Location: [facility];  Service: Urology;  Laterality: Right;  .  ortho surgery on fingers  2005 & 2008   Dr. Lorelle Formosa finger release  . PROSTATECTOMY  1999   Dr. Rosana Hoes    There were no vitals filed for this visit.  Subjective Assessment - 04/30/18 1433    Subjective  Patient reported that he is feeling good and his ankle and feet have not been bothering him.     Currently in Pain?  No/denies         [facility] PT Assessment - 04/30/18 0001      Assessment   Medical Diagnosis  Pain in Left Leg    Referring Provider (PT)  Ardeen Jourdain, PA-C      [place] residence    Living Arrangements  Spouse/significant other    Type of [place] to enter     Entrance Stairs-Number of Steps  4    [place]  One level      Prior Function   Level of Independence  Independent;Independent with basic ADLs      Observation/Other Assessments   Observations  No noted swelling or redness in patient's calf or legs.     Focus on Therapeutic Outcomes (FOTO)   11% limited (was 32% limited)      AROM   Right Ankle Dorsiflexion  19   was 11   Right Ankle Plantar Flexion  55   was 48   Right Ankle Inversion  21   was 15   Right Ankle Eversion  16   was 10   Left Ankle Dorsiflexion  10   was -2   Left Ankle Plantar Flexion  55   was 50   Left Ankle Inversion  17   was 10   Left Ankle Eversion  23   was 15     Strength   Right Hip Flexion  4+/5   was 4   Right Hip Extension  4-/5   was 3   Right Hip ABduction  4+/5   was 4   Left Hip Flexion  4+/5   was 4   Left Hip Extension  4-/5   was 3-   Left Hip ABduction  4+/5   was 4   Right Knee Flexion  5/5   was 5   Right Knee Extension  5/5   was 5   Left Knee Flexion  5/5   was 5   Left Knee Extension  5/5   was 5   Right Ankle Dorsiflexion  5/5   was 4+   Right Ankle Plantar Flexion  3+/5   was 3-   Right Ankle Inversion  5/5    Right Ankle Eversion  5/5    Left Ankle Dorsiflexion  5/5   was 4+   Left Ankle Plantar Flexion  3+/5   was 3-   Left Ankle Inversion  5/5    Left Ankle Eversion  5/5      Ambulation/Gait   Ambulation/Gait  Yes    Ambulation Distance (Feet)  552 Feet   2MWT   Assistive device  None    Gait Pattern  Within Functional Limits    Gait velocity  1.4 m/s    Gait Comments  No reports of pain throughout      Balance   Balance Assessed  Yes      Static Standing Balance   Static Standing - Balance Support  No upper extremity supported    Static Standing Balance -  Activities   Single Leg Stance - Right Leg;Single Leg Stance - Left Leg    Static Standing - Comment/# of Minutes  SLS Left: 39.23 seconds; 32.25 seconds  right                   [facility] Adult PT Treatment/Exercise - 04/30/18 0001      Ankle Exercises: Sidelying   Other Sidelying Ankle Exercises  Hip abduction: x 2 repetitions for HEP demonstration of understanding      Ankle Exercises: Supine   Other Supine Ankle Exercises  Bridge x 2 for demonstration for understanding of HEP               PT Short Term Goals - 04/30/18 1512      PT SHORT TERM GOAL #1   Title  Patient will demonstrate understanding and report regular compliance with HEP in order to improve ankle ROM, lower extremity strength, and overall functional mobility.     Time  2    Period  Weeks    Status  Achieved    Target Date  03/28/18      PT SHORT TERM GOAL #2   Title  Patient will demonstrate ability to maintain single limb stance on each lower extremity for at least 15 seconds demonstrating improved balance, stability, and safety. 2    Baseline  See objective measures    Time  2    Period  Weeks    Status  Achieved    Target Date  03/28/18        PT Long Term Goals - 04/30/18 1512      PT LONG TERM GOAL #1   Title  Patient will demonstrate improvement in MMT strength of at least 1/2 MMT strength grade in all deficient muscle groups in order to improve mechanics with ambulation and decrease pain with functional activities.     Baseline  See objective measures    Time  4    Period  Weeks    Status  Achieved      PT LONG TERM GOAL #2   Title  Patient will demonstrate improvement in bilateral ankle AROM of at least 5 degrees in deficient areas to improve mechanics with ambulation and decrease pain with functional activities.     Baseline  See objective measures    Time  4    Period  Weeks    Status  Achieved      PT LONG TERM GOAL #3   Title  Patient will demonstrate ability to ambulate without reports of pain on 2MWT and with minimal compensations for improved safety and decreased pain during daily activities.     Baseline  See  objective measures    Time  4    Period  Weeks    Status  Achieved            Plan - 04/30/18 1454    Clinical Impression Statement  This session performed a re-assessment of patient's progress towards goals. Patient achieved all short term and long term goals. Patient expressed that he feels ready to be discharged from physical therapy at this time. Remainder of session educated patient on an updated home exercise program to focus on remaining areas of deficits. Patient is being discharged at this time to a home exercise program as patient has met all goals and is pleased with how he is functioning at this time.  Rehab Potential  Good    Clinical Impairments Affecting Rehab Potential  Positive: Motivated; Negative: Chronicity of leg pain    PT Frequency  2x / week    PT Duration  4 weeks    PT Treatment/Interventions  ADLs/Self Care Home Management;Aquatic Therapy;Electrical Stimulation;DME Instruction;Gait training;Stair training;Functional mobility training;Therapeutic activities;Therapeutic exercise;Balance training;Neuromuscular re-education;Patient/family education;Orthotic Fit/Training;Manual techniques;Passive range of motion;Dry needling;Energy conservation;Taping    PT Next Visit Plan  Discharged    PT Home Exercise Plan  03/14/18: Gastroc stretch with towel 3x30 seconds 1-2 x/day; 03/28/18: heel raises on step 15x 1x/day; 04/30/18: PF stretch 15'' x 4, bridges x 10, sidelying hip abduction       Patient will benefit from skilled therapeutic intervention in order to improve the following deficits and impairments:  Abnormal gait, Improper body mechanics, Pain, Decreased mobility, Increased muscle spasms, Decreased activity tolerance, Decreased range of motion, Decreased strength, Hypomobility, Decreased balance, Difficulty walking, Impaired flexibility  Visit Diagnosis: Stiffness of left ankle, not elsewhere classified - Plan: PT plan of care cert/re-cert  Stiffness of right  ankle, not elsewhere classified - Plan: PT plan of care cert/re-cert  Muscle weakness (generalized) - Plan: PT plan of care cert/re-cert  Other abnormalities of gait and mobility - Plan: PT plan of care cert/re-cert     Problem List Patient Active Problem List   Diagnosis Date Noted  . S/p nephrectomy 02/04/2018  . Neoplasm of right kidney 05/21/2017  . Vitamin D deficiency 03/28/2017  . Special screening for malignant neoplasms, colon   . Benign neoplasm of transverse colon   . History of DVT (deep vein thrombosis) 07/20/2016  . Renal lesion 07/20/2016  . Type 2 diabetes mellitus with stage 3 chronic kidney disease, with long-term current use of insulin ([place]) 01/15/2014  . Chronic idiopathic gout of multiple sites 01/15/2014  . Calcified pleural plaque on chest x-ray 06/30/2013  . Tinea pedis 09/28/2011  . Hematuria 05/19/2011  . HEMORRHOIDS 05/29/2007  . Pulmonary nodule 03/27/2007  . GERD 03/27/2007  . Benign prostatic hyperplasia with urinary obstruction 03/27/2007  . KELOID 03/27/2007  . Mixed hyperlipidemia 01/24/2007  . CARPAL TUNNEL SYNDROME 01/24/2007  . Essential hypertension 01/24/2007  . RENAL CALCULUS 01/24/2007  . Osteoarthritis 01/24/2007   Clarene Critchley PT, DPT 3:35 PM, 04/30/18 [place], [place], [address] Phone: [phone]   Fax:  [phone]  Name: Hector Rose MRN: [id number] Date of Birth: 10-23-1940

## 2018-04-30 NOTE — Patient Instructions (Addendum)
Plantarflexion stretch    Bend ankles to point feet down, hold for 15 seconds Repeat _4___ times. Do __2__ sessions per day.  http://gt2.exer.us/367   Copyright  VHI. All rights reserved.  Bridge    Lie back, legs bent. Inhale, pressing hips up. Keeping ribs in, lengthen lower back. Exhale, rolling down along spine from top. Repeat _10___ times. Do _1___ sessions per day.  http://pm.exer.us/54   Copyright  VHI. All rights reserved.    Copyright  VHI. All rights reserved.  HIP: Abduction - Side-Lying    Lie on side, legs straight and in line with trunk. Squeeze glutes. Raise top leg up and slightly back. Point toes forward. _10__ reps per set, _1__ sets per day, _7__ days per week Bend bottom leg to stabilize pelvis.  Copyright  VHI. All rights reserved.

## 2018-05-02 ENCOUNTER — Ambulatory Visit (HOSPITAL_COMMUNITY): Payer: PPO | Admitting: Physical Therapy

## 2018-05-02 DIAGNOSIS — E663 Overweight: Secondary | ICD-10-CM | POA: Diagnosis not present

## 2018-05-02 DIAGNOSIS — H109 Unspecified conjunctivitis: Secondary | ICD-10-CM | POA: Diagnosis not present

## 2018-05-02 DIAGNOSIS — Z6829 Body mass index (BMI) 29.0-29.9, adult: Secondary | ICD-10-CM | POA: Diagnosis not present

## 2018-05-02 DIAGNOSIS — J069 Acute upper respiratory infection, unspecified: Secondary | ICD-10-CM | POA: Diagnosis not present

## 2018-05-02 DIAGNOSIS — Z1389 Encounter for screening for other disorder: Secondary | ICD-10-CM | POA: Diagnosis not present

## 2018-06-13 ENCOUNTER — Telehealth: Payer: Self-pay

## 2018-06-13 NOTE — Telephone Encounter (Signed)
Copied from [place] [id number]. Topic: General - Other >> Jun 13, 2018  3:20 PM Leward Quan A wrote: Reason for CRM: Patient called to say he is having some issues with tooth or Gum pain but his dentist is closed he is asking for Dr Volanda Napoleon to send Rx to the pharmacy. [id number] - [place], [place] [phone] (Phone) [phone] (Fax)

## 2018-06-14 NOTE — Telephone Encounter (Signed)
Spoke with pt offered a Web Ex visit, pt stated that his dentist called Rx for his toothcahe

## 2018-06-17 ENCOUNTER — Other Ambulatory Visit: Payer: Self-pay | Admitting: "Endocrinology

## 2018-06-17 DIAGNOSIS — R200 Anesthesia of skin: Secondary | ICD-10-CM | POA: Diagnosis not present

## 2018-06-17 DIAGNOSIS — Z0001 Encounter for general adult medical examination with abnormal findings: Secondary | ICD-10-CM | POA: Diagnosis not present

## 2018-06-17 DIAGNOSIS — Z681 Body mass index (BMI) 19 or less, adult: Secondary | ICD-10-CM | POA: Diagnosis not present

## 2018-06-17 DIAGNOSIS — E1129 Type 2 diabetes mellitus with other diabetic kidney complication: Secondary | ICD-10-CM | POA: Diagnosis not present

## 2018-06-17 DIAGNOSIS — R05 Cough: Secondary | ICD-10-CM | POA: Diagnosis not present

## 2018-06-17 DIAGNOSIS — Z1389 Encounter for screening for other disorder: Secondary | ICD-10-CM | POA: Diagnosis not present

## 2018-06-17 DIAGNOSIS — N2581 Secondary hyperparathyroidism of renal origin: Secondary | ICD-10-CM | POA: Diagnosis not present

## 2018-06-17 DIAGNOSIS — E1122 Type 2 diabetes mellitus with diabetic chronic kidney disease: Secondary | ICD-10-CM | POA: Diagnosis not present

## 2018-06-17 DIAGNOSIS — N184 Chronic kidney disease, stage 4 (severe): Secondary | ICD-10-CM | POA: Diagnosis not present

## 2018-06-17 DIAGNOSIS — G459 Transient cerebral ischemic attack, unspecified: Secondary | ICD-10-CM | POA: Diagnosis not present

## 2018-06-17 DIAGNOSIS — E875 Hyperkalemia: Secondary | ICD-10-CM | POA: Diagnosis not present

## 2018-06-17 DIAGNOSIS — J329 Chronic sinusitis, unspecified: Secondary | ICD-10-CM | POA: Diagnosis not present

## 2018-06-17 DIAGNOSIS — T8544XA Capsular contracture of breast implant, initial encounter: Secondary | ICD-10-CM | POA: Diagnosis not present

## 2018-06-17 DIAGNOSIS — R809 Proteinuria, unspecified: Secondary | ICD-10-CM | POA: Diagnosis not present

## 2018-06-17 DIAGNOSIS — I129 Hypertensive chronic kidney disease with stage 1 through stage 4 chronic kidney disease, or unspecified chronic kidney disease: Secondary | ICD-10-CM | POA: Diagnosis not present

## 2018-06-17 DIAGNOSIS — R531 Weakness: Secondary | ICD-10-CM | POA: Diagnosis not present

## 2018-06-17 DIAGNOSIS — E785 Hyperlipidemia, unspecified: Secondary | ICD-10-CM | POA: Diagnosis not present

## 2018-06-17 DIAGNOSIS — D631 Anemia in chronic kidney disease: Secondary | ICD-10-CM | POA: Diagnosis not present

## 2018-06-27 DIAGNOSIS — M25551 Pain in right hip: Secondary | ICD-10-CM | POA: Diagnosis not present

## 2018-06-27 DIAGNOSIS — M25561 Pain in right knee: Secondary | ICD-10-CM | POA: Diagnosis not present

## 2018-06-27 DIAGNOSIS — M25521 Pain in right elbow: Secondary | ICD-10-CM | POA: Diagnosis not present

## 2018-07-12 ENCOUNTER — Ambulatory Visit (HOSPITAL_COMMUNITY)
Admission: RE | Admit: 2018-07-12 | Discharge: 2018-07-12 | Disposition: A | Discharge status: Home / Self Care | Payer: PPO | Source: Ambulatory Visit | Attending: Urology | Admitting: Urology

## 2018-07-12 ENCOUNTER — Other Ambulatory Visit: Payer: Self-pay | Admitting: Urology

## 2018-07-12 ENCOUNTER — Other Ambulatory Visit (HOSPITAL_COMMUNITY): Payer: Self-pay | Admitting: Urology

## 2018-07-12 ENCOUNTER — Other Ambulatory Visit: Payer: Self-pay

## 2018-07-12 DIAGNOSIS — C641 Malignant neoplasm of right kidney, except renal pelvis: Secondary | ICD-10-CM | POA: Diagnosis not present

## 2018-07-12 DIAGNOSIS — I10 Essential (primary) hypertension: Secondary | ICD-10-CM | POA: Diagnosis not present

## 2018-07-12 DIAGNOSIS — R3912 Poor urinary stream: Secondary | ICD-10-CM | POA: Diagnosis not present

## 2018-07-12 DIAGNOSIS — D49512 Neoplasm of unspecified behavior of left kidney: Secondary | ICD-10-CM | POA: Diagnosis not present

## 2018-07-12 DIAGNOSIS — N401 Enlarged prostate with lower urinary tract symptoms: Secondary | ICD-10-CM | POA: Diagnosis not present

## 2018-07-24 DIAGNOSIS — E785 Hyperlipidemia, unspecified: Secondary | ICD-10-CM | POA: Diagnosis not present

## 2018-07-24 DIAGNOSIS — N182 Chronic kidney disease, stage 2 (mild): Secondary | ICD-10-CM | POA: Diagnosis not present

## 2018-07-24 DIAGNOSIS — E1129 Type 2 diabetes mellitus with other diabetic kidney complication: Secondary | ICD-10-CM | POA: Diagnosis not present

## 2018-08-01 ENCOUNTER — Ambulatory Visit
Admission: RE | Admit: 2018-08-01 | Discharge: 2018-08-01 | Disposition: A | Discharge status: Home / Self Care | Payer: PPO | Source: Ambulatory Visit | Attending: Urology | Admitting: Urology

## 2018-08-01 ENCOUNTER — Other Ambulatory Visit: Payer: Self-pay

## 2018-08-01 DIAGNOSIS — C641 Malignant neoplasm of right kidney, except renal pelvis: Secondary | ICD-10-CM | POA: Diagnosis not present

## 2018-08-11 IMAGING — CR DG CHEST 2V
2 series · 2 of 2 positions shown · non-contrast
Comparison: 02/24/2016

CLINICAL DATA: Neoplasm of RIGHT kidney

EXAM:
CHEST  2 VIEW

[w chest pa]
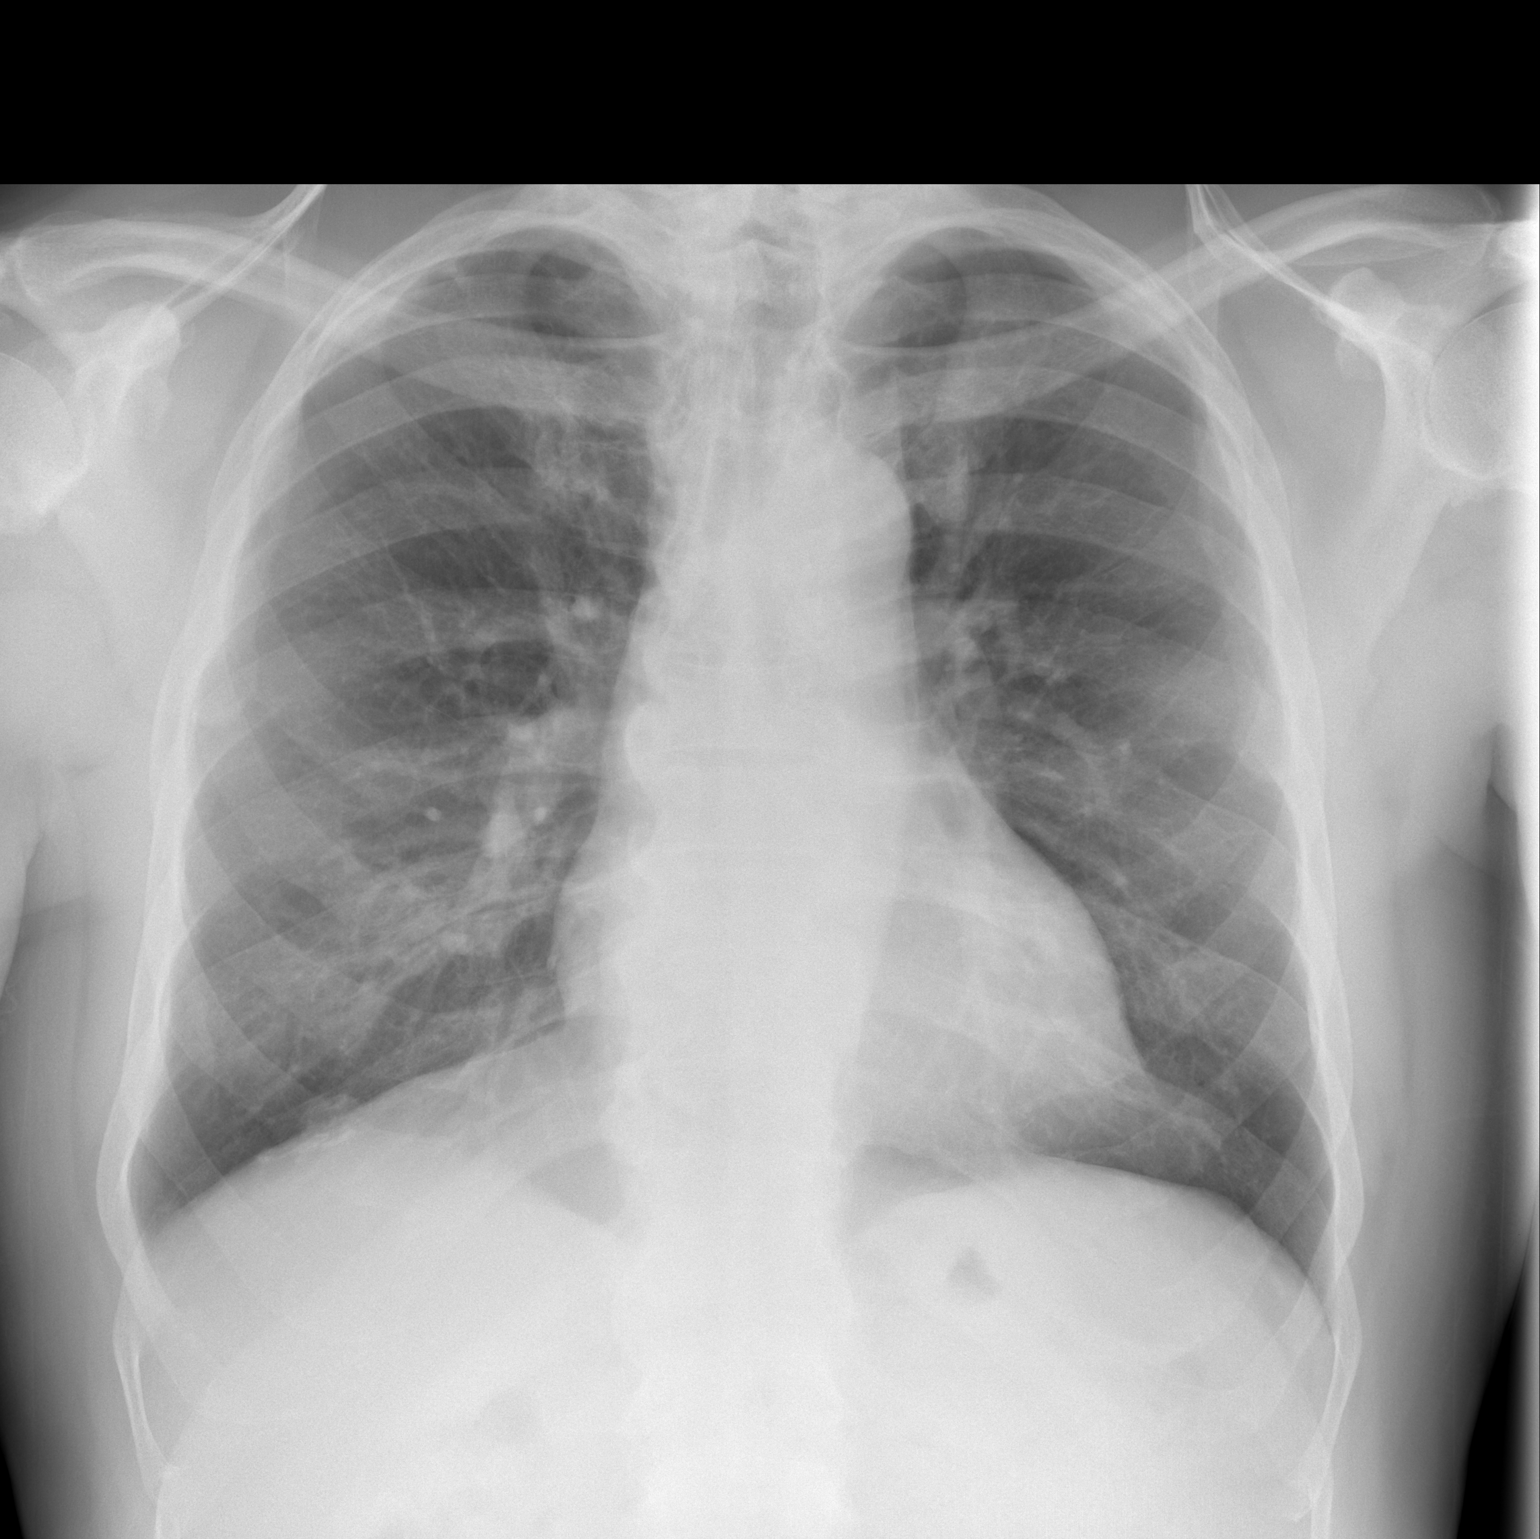

[w chest lat]
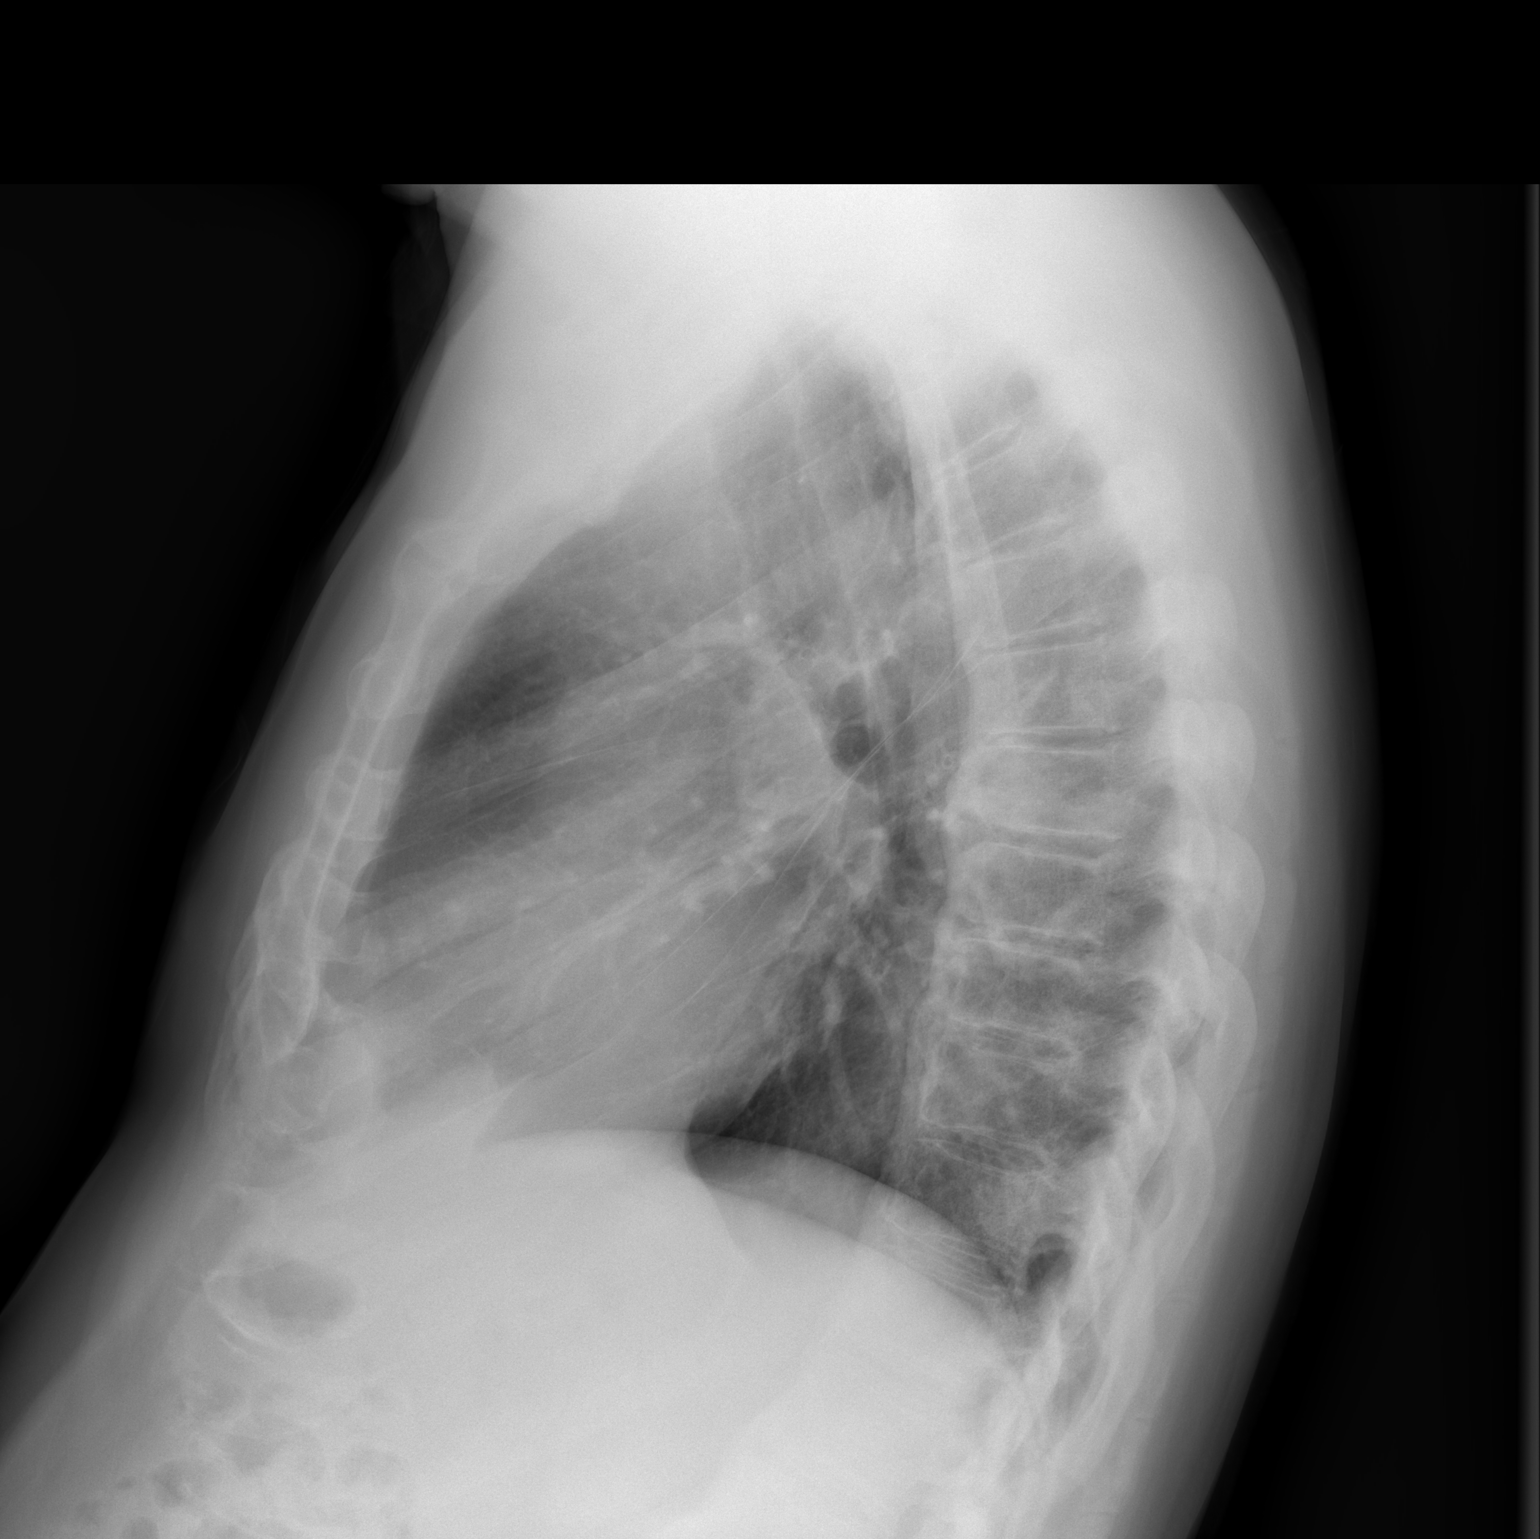

[2 of 2 positions shown; findings below may reference images not displayed]

FINDINGS: Normal heart size, mediastinal contours, and pulmonary vascularity.

Minimal scarring at RIGHT apex.

Lungs otherwise clear.

No pulmonary infiltrate, pleural effusion or pneumothorax.

Scattered endplate spur formation thoracic spine with diffuse
idiopathic skeletal hyperostosis.

No definite bone lesions.
IMPRESSION: No acute abnormalities.

## 2018-08-12 DIAGNOSIS — M1991 Primary osteoarthritis, unspecified site: Secondary | ICD-10-CM | POA: Diagnosis not present

## 2018-08-12 DIAGNOSIS — M109 Gout, unspecified: Secondary | ICD-10-CM | POA: Diagnosis not present

## 2018-08-12 DIAGNOSIS — E6609 Other obesity due to excess calories: Secondary | ICD-10-CM | POA: Diagnosis not present

## 2018-08-12 DIAGNOSIS — Z1389 Encounter for screening for other disorder: Secondary | ICD-10-CM | POA: Diagnosis not present

## 2018-08-12 DIAGNOSIS — Z6830 Body mass index (BMI) 30.0-30.9, adult: Secondary | ICD-10-CM | POA: Diagnosis not present

## 2018-08-19 DIAGNOSIS — E1122 Type 2 diabetes mellitus with diabetic chronic kidney disease: Secondary | ICD-10-CM | POA: Diagnosis not present

## 2018-08-19 DIAGNOSIS — Z794 Long term (current) use of insulin: Secondary | ICD-10-CM | POA: Diagnosis not present

## 2018-08-19 DIAGNOSIS — N183 Chronic kidney disease, stage 3 (moderate): Secondary | ICD-10-CM | POA: Diagnosis not present

## 2018-08-20 LAB — COMPLETE METABOLIC PANEL WITH GFR
AG Ratio: 1.7 (calc) (ref 1.0–2.5)
ALT: 14 U/L (ref 9–46)
AST: 15 U/L (ref 10–35)
Albumin: 4.5 g/dL (ref 3.6–5.1)
Alkaline phosphatase (APISO): 78 U/L (ref 35–144)
BUN/Creatinine Ratio: 13 (calc) (ref 6–22)
BUN: 27 mg/dL — ABNORMAL HIGH (ref 7–25)
CALCIUM: 9.5 mg/dL (ref 8.6–10.3)
CO2: 31 mmol/L (ref 20–32)
CREATININE: 2.03 mg/dL — AB (ref 0.70–1.18)
Chloride: 105 mmol/L (ref 98–110)
GFR, EST AFRICAN AMERICAN: 36 mL/min/{1.73_m2} — AB (ref >=60)
GFR, EST NON AFRICAN AMERICAN: 31 mL/min/{1.73_m2} — AB (ref >=60)
Globulin: 2.6 g/dL (calc) (ref 1.9–3.7)
Glucose, Bld: 90 mg/dL (ref 65–99)
Potassium: 4.3 mmol/L (ref 3.5–5.3)
SODIUM: 143 mmol/L (ref 135–146)
Total Bilirubin: 0.7 mg/dL (ref 0.2–1.2)
Total Protein: 7.1 g/dL (ref 6.1–8.1)

## 2018-08-20 LAB — HEMOGLOBIN A1C
HEMOGLOBIN A1C: 7.6 %{Hb} — AB (ref <5.7)
MEAN PLASMA GLUCOSE: 171 (calc)
eAG (mmol/L): 9.5 (calc)

## 2018-08-27 ENCOUNTER — Encounter: Payer: Self-pay | Admitting: "Endocrinology

## 2018-08-27 ENCOUNTER — Other Ambulatory Visit: Payer: Self-pay

## 2018-08-27 ENCOUNTER — Ambulatory Visit (INDEPENDENT_AMBULATORY_CARE_PROVIDER_SITE_OTHER): Payer: PPO | Admitting: "Endocrinology

## 2018-08-27 VITALS — BP 175/73 | HR 43 | Ht 73.0 in | Wt 219.6 lb

## 2018-08-27 DIAGNOSIS — N183 Chronic kidney disease, stage 3 unspecified: Secondary | ICD-10-CM

## 2018-08-27 DIAGNOSIS — E782 Mixed hyperlipidemia: Secondary | ICD-10-CM | POA: Diagnosis not present

## 2018-08-27 DIAGNOSIS — E1122 Type 2 diabetes mellitus with diabetic chronic kidney disease: Secondary | ICD-10-CM

## 2018-08-27 DIAGNOSIS — Z794 Long term (current) use of insulin: Secondary | ICD-10-CM

## 2018-08-27 DIAGNOSIS — I10 Essential (primary) hypertension: Secondary | ICD-10-CM

## 2018-08-27 NOTE — Progress Notes (Signed)
Endocrinology follow-up note   Subjective:    Patient ID: Hector Rose, male    DOB: Feb 19, 1941,    Past Medical History:  Diagnosis Date  . Asthma 04-11-11   AS CHILD ONLY  . BPH (benign prostatic hypertrophy) with urinary obstruction   . Carpal tunnel syndrome    patient denies at 05/14/17 preop visit   . Difficult intubation 04-11-11   08-11-97 some issues with intubation/record with chart  . DJD (degenerative joint disease)   . DM (diabetes mellitus) ([place]) 04-11-11   tyep II   . DVT femoral (deep venous thrombosis) with thrombophlebitis ([place]) 04-11-11   ?'09-tx. on Xarelton x 3-4 years   . GERD (gastroesophageal reflux disease) 04-11-11   mild, no med in 1 month  . Gout 04-11-11    ankle 2 yrs ago  . Hemorrhoids   . Hypercholesteremia   . Hypertension   . Internal hemorrhoids   . Keloid 04-11-11   multiple-arms,back, chest  . Pulmonary nodule 04-11-11   "PT. NOT AWARE" -denies problems with breathing  . Renal calculus   . Thyroid disease    hypothyroid   Past Surgical History:  Procedure Laterality Date  . cataract surgery  12/2013  . COLONOSCOPY WITH PROPOFOL N/A 02/05/2017   Procedure: COLONOSCOPY WITH PROPOFOL;  Surgeon: Ladene Artist, MD;  Location: [facility];  Service: Endoscopy;  Laterality: N/A;  . COSMETIC SURGERY  2000   Keloid injections  . CYSTOSCOPY WITH BIOPSY  04/14/2011   Procedure: CYSTOSCOPY WITH BIOPSY;  Surgeon: Dutch Gray, MD;  Location: [facility];  Service: Urology;  Laterality: N/A;  Bladder Biopsies   . CYSTOSCOPY/RETROGRADE/URETEROSCOPY  04/14/2011   Procedure: CYSTOSCOPY/RETROGRADE/URETEROSCOPY;  Surgeon: Dutch Gray, MD;  Location: [facility];  Service: Urology;  Laterality: Bilateral;  (Bil) RPG   . LAPAROSCOPIC NEPHRECTOMY Right 05/21/2017   Procedure: LAPAROSCOPIC RADICAL NEPHRECTOMY;  Surgeon: Raynelle Bring, MD;  Location: [facility];  Service: Urology;  Laterality: Right;  . ortho surgery on fingers  2005 & 2008   Dr. Lorelle Formosa finger release   . PROSTATECTOMY  1999   Dr. Rosana Hoes   Social History   Socioeconomic History  . Marital status: Married    Spouse name: Not on file  . Number of children: 2  . Years of education: Not on file  . Highest education level: Not on file  Occupational History  . Occupation: retired from [employer]  . Financial resource strain: Not on file  . Food insecurity    Worry: Not on file    Inability: Not on file  . Transportation needs    Medical: Not on file    Non-medical: Not on file  Tobacco Use  . Smoking status: Never Smoker  . Smokeless tobacco: Never Used  Substance and Sexual Activity  . Alcohol use: No    Alcohol/week: 0.0 standard drinks  . Drug use: No  . Sexual activity: Yes  Lifestyle  . Physical activity    Days per week: Not on file    Minutes per session: Not on file  . Stress: Not on file  Relationships  . Social Herbalist on phone: Not on file    Gets together: Not on file    Attends religious service: Not on file    Active member of club or organization: Not on file    Attends meetings of clubs or organizations: Not on file    Relationship status: Not on file  Other Topics Concern  .  Not on file  Social History Narrative  . Not on file   Outpatient Encounter Medications as of 08/27/2018  Medication Sig  . allopurinol (ZYLOPRIM) 300 MG tablet TAKE 1 TABLET BY MOUTH ONCE DAILY  . amLODipine (NORVASC) 10 MG tablet TAKE 1 TABLET EVERY MORNING  . BD VEO INSULIN SYR ULTRAFINE 31G X 15/64" 1 ML MISC USE TWICE A DAY AS DIRECTED  . [facility] D3 5000 units capsule TAKE 1 CAPSULE BY MOUTH DAILY  . diphenhydrAMINE (BENADRYL) 25 MG tablet Take 25 mg by mouth daily as needed for allergies.  . fluticasone (CUTIVATE) 0.05 % cream Apply 1 application 2 (two) times a week topically. To face for itching and flaking  . fluticasone (FLONASE) 50 MCG/ACT nasal spray Place 2 sprays daily as needed into both nostrils for allergies or rhinitis.  Marland Kitchen gabapentin  (NEURONTIN) 600 MG tablet Take 600-1,200 mg by mouth See admin instructions. Take 1200 mg in the morning and 600 mg at night  . insulin NPH-regular Human (NOVOLIN 70/30) (70-30) 100 UNIT/ML injection Inject 30 Units into the skin 2 (two) times daily with a meal.  . metoprolol (LOPRESSOR) 50 MG tablet TAKE 1 TABLET TWICE DAILY  . ONE TOUCH ULTRA TEST test strip USE TO TEST BLOOD SUGAR TWICE DAILY.  Marland Kitchen ONETOUCH DELICA LANCETS 30Z MISC USE TO TEST BLOOD SUGAR TWICE DAILY.  Marland Kitchen Propylene Glycol (SYSTANE BALANCE OP) Apply 1 drop as needed to eye (irritation).  . Rivaroxaban (XARELTO) 15 MG TABS tablet Take 15 mg by mouth daily.   . rosuvastatin (CRESTOR) 20 MG tablet TAKE 1 TABLET BY MOUTH EVERY DAY  . [DISCONTINUED] simvastatin (ZOCOR) 40 MG tablet Takes 1/2 tablet daily bedtime   No facility-administered encounter medications on file as of 08/27/2018.    ALLERGIES: Allergies  Allergen Reactions  . Ace Inhibitors Swelling    REACTION: angio edema (swelling of lips)  . Metformin And Related     Itching in higher doses    VACCINATION STATUS: Immunization History  Administered Date(s) Administered  . Influenza Split 11/27/2011, 12/30/2012, 01/01/2014, 03/13/2017  . Influenza Whole 01/27/2009, 11/30/2009, 11/17/2010  . Influenza, High Dose Seasonal PF 12/21/2015, 01/04/2018  . Pneumococcal Conjugate-13 02/04/2018  . Pneumococcal Polysaccharide-23 12/02/2008    Diabetes He presents for his follow-up diabetic visit. He has type 2 diabetes mellitus. Onset time: Diagnosed approximately  at age 23. His disease course has been stable. Pertinent negatives for hypoglycemia include no confusion, headaches, pallor or seizures. Pertinent negatives for diabetes include no chest pain, no fatigue, no polydipsia, no polyphagia, no polyuria and no weakness. Symptoms are stable. Risk factors for coronary artery disease include diabetes mellitus, dyslipidemia, male sex, hypertension and sedentary lifestyle. Current  diabetic treatment includes insulin injections. His weight is decreasing steadily. He is following a generally unhealthy diet. Meal planning includes ADA exchanges. He participates in exercise intermittently. His breakfast blood glucose range is generally 130-140 mg/dl. His lunch blood glucose range is generally 140-180 mg/dl. His dinner blood glucose range is generally 140-180 mg/dl. His overall blood glucose range is 140-180 mg/dl.  Hypertension This is a chronic problem. The current episode started more than 1 year ago. The problem is controlled. Pertinent negatives include no chest pain, headaches, neck pain, palpitations or shortness of breath. Past treatments include calcium channel blockers.  Hyperlipidemia This is a chronic problem. The current episode started more than 1 year ago. The problem is uncontrolled. Pertinent negatives include no chest pain, myalgias or shortness of breath. Current antihyperlipidemic treatment includes  bile acid squestrants. Risk factors for coronary artery disease include dyslipidemia and diabetes mellitus.     Review of Systems  Constitutional: Negative for fatigue and unexpected weight change.  HENT: Negative for dental problem, mouth sores and trouble swallowing.   Eyes: Negative for visual disturbance.  Respiratory: Negative for cough, choking, chest tightness, shortness of breath and wheezing.   Cardiovascular: Negative for chest pain, palpitations and leg swelling.  Gastrointestinal: Negative for abdominal distention, abdominal pain, constipation, diarrhea, nausea and vomiting.  Endocrine: Negative for polydipsia, polyphagia and polyuria.  Genitourinary: Negative for dysuria, flank pain, hematuria and urgency.  Musculoskeletal: Negative for back pain, gait problem, joint swelling, myalgias and neck pain.  Skin: Negative for pallor, rash and wound.  Neurological: Negative for seizures, syncope, weakness, numbness and headaches.  Psychiatric/Behavioral:  Negative for confusion, dysphoric mood, hallucinations and suicidal ideas.    Objective:    BP (!) 175/73   Pulse (!) 43   Ht 6\' 1"  (1.854 m)   Wt 219 lb 9.6 oz (99.6 kg)   BMI 28.97 kg/m   Wt Readings from Last 3 Encounters:  08/27/18 219 lb 9.6 oz (99.6 kg)  04/15/18 222 lb (100.7 kg)  03/21/18 225 lb (102.1 kg)    Physical Exam  Constitutional: He is oriented to person, place, and time. He appears well-developed. He is cooperative.  HENT:  Head: Normocephalic and atraumatic.  Eyes: EOM are normal.  Neck: Normal range of motion. Neck supple. No tracheal deviation present. No thyromegaly present.  Cardiovascular: Normal rate. Exam reveals no gallop.  No murmur heard. Pulses:      Carotid pulses are 2+ on the right side.      Radial pulses are 2+ on the right side.       Dorsalis pedis pulses are 2+ on the right side.       Posterior tibial pulses are 2+ on the right side.  Pulmonary/Chest: Effort normal. No respiratory distress. He has no wheezes.  Abdominal: He exhibits no distension. There is no hepatosplenomegaly. There is no abdominal tenderness. There is no guarding and no CVA tenderness.  Musculoskeletal:        General: No edema.     Right shoulder: He exhibits no swelling and no deformity.  Lymphadenopathy:       Head (right side): No submental and no submandibular adenopathy present.       Head (left side): No submental and no submandibular adenopathy present.       Right cervical: No superficial cervical adenopathy present.      Left cervical: No superficial cervical adenopathy present.       Right: No supraclavicular adenopathy present.       Left: No supraclavicular adenopathy present.  Neurological: He is alert and oriented to person, place, and time. He has normal strength and normal reflexes. No cranial nerve deficit or sensory deficit. Gait normal.  Skin: Skin is warm and dry. No rash noted. No cyanosis. Nails show no clubbing.  Psychiatric: He has a normal  mood and affect. His speech is normal. Judgment normal. Cognition and memory are normal.   Recent Results (from the past 2160 hour(s))  Hemoglobin A1c     Status: Abnormal   Collection Time: 08/19/18  8:28 AM  Result Value Ref Range   Hgb A1c MFr Bld 7.6 (H) <5.7 % of total Hgb    Comment: For someone without known diabetes, a hemoglobin A1c value of 6.5% or greater indicates that they may have  diabetes and this should be confirmed with a follow-up  test. . For someone with known diabetes, a value <7% indicates  that their diabetes is well controlled and a value  greater than or equal to 7% indicates suboptimal  control. A1c targets should be individualized based on  duration of diabetes, age, comorbid conditions, and  other considerations. . Currently, no consensus exists regarding use of hemoglobin A1c for diagnosis of diabetes for children. .    Mean Plasma Glucose 171 (calc)   eAG (mmol/L) 9.5 (calc)  COMPLETE METABOLIC PANEL WITH GFR     Status: Abnormal   Collection Time: 08/19/18  8:28 AM  Result Value Ref Range   Glucose, Bld 90 65 - 99 mg/dL    Comment: .            Fasting reference interval .    BUN 27 (H) 7 - 25 mg/dL   Creat 2.03 (H) 0.70 - 1.18 mg/dL    Comment: For patients >33 years of age, the reference limit for Creatinine is approximately 13% higher for people identified as African-American. .    GFR, Est Non African American 31 (L) > OR = 60 mL/min/1.36m2   GFR, Est African American 36 (L) > OR = 60 mL/min/1.93m2   BUN/Creatinine Ratio 13 6 - 22 (calc)   Sodium 143 135 - 146 mmol/L   Potassium 4.3 3.5 - 5.3 mmol/L   Chloride 105 98 - 110 mmol/L   CO2 31 20 - 32 mmol/L   Calcium 9.5 8.6 - 10.3 mg/dL   Total Protein 7.1 6.1 - 8.1 g/dL   Albumin 4.5 3.6 - 5.1 g/dL   Globulin 2.6 1.9 - 3.7 g/dL (calc)   AG Ratio 1.7 1.0 - 2.5 (calc)   Total Bilirubin 0.7 0.2 - 1.2 mg/dL   Alkaline phosphatase (APISO) 78 35 - 144 U/L   AST 15 10 - 35 U/L   ALT 14  9 - 46 U/L   Lipid Panel     Component Value Date/Time   CHOL 222 (H) 11/20/2017 0745   TRIG 118 11/20/2017 0745   HDL 46 11/20/2017 0745   CHOLHDL 4.8 11/20/2017 0745   VLDL 28 03/17/2016 0714   LDLCALC 152 (H) 11/20/2017 0745   LDLDIRECT 147.5 06/10/2012 0930     Assessment & Plan:   1. Type 2 diabetes mellitus with stage 2 chronic kidney disease He came with fluctuating blood glucose profile, A1c of 7.6%.    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     Patient is advised to stick to a routine mealtimes to eat 3 meals  a day and avoid unnecessary snacks ( to snack only to correct hypoglycemia).  - he  admits there is a room for improvement in his diet and drink choices. -  Suggestion is made for him to avoid simple carbohydrates  from his diet including Cakes, Sweet Desserts / Pastries, Ice Cream, Soda (diet and regular), Sweet Tea, Candies, Chips, Cookies, Sweet Pastries,  Store Bought Juices, Alcohol in Excess of  1-2 drinks a day, Artificial Sweeteners, Coffee Creamer, and "Sugar-free" Products. This will help patient to have stable blood glucose profile and potentially avoid unintended weight gain.   -He will be continued on the same insulin regimen, advised to continue Novolin 70/30  30 units with breakfast and  25 units with supper when pre-meal blood glucose is above 80.  -  He is advised to continue glucose monitoring at least 2 times daily-before breakfast and at supper and anytime as needed. - Patient is instructed to call back with extremes of blood glucose readings  less than 70 or greater than 300 mg/dl. -He is not a candidate for metformin, SGLT2 inhibitors, nor incretin therapy.  2. Mixed hyperlipidemia - His repeat lipid panel showed significant loss of control to LDL of 166.  He is tolerating Crestor better than pravastatin.  He is advised to  continue Crestor 20 mg p.o. nightly.  - He is advised to continue with omega-3 fatty acids ,avoid butter and fried food and exercise regularly.  3. Essential hypertension  His blood pressure is not controlled to target.    He is advised to continue amlodipine.  4) vitamin D deficiency -He is on ongoing supplement with  vitamin D 3 5000 units daily for the next 90 days.  - Patient Care Time Today:  25 min, of which >50% was spent in reviewing his  current and  previous labs/studies, previous treatments, and medications doses and developing a plan for long-term care based on the latest recommendations for standards of care.  Grace Blight participated in the discussions, expressed understanding, and voiced agreement with the above plans.  All questions were answered to his satisfaction. he is encouraged to contact clinic should he have any questions or concerns prior to his return visit.   Follow up plan: Return in about 6 months (around 02/26/2019) for Follow up with Pre-visit Labs, Meter, and Logs.  Glade Lloyd, MD Phone: [phone]  Fax: [phone]  -  This note was partially dictated with voice recognition software. Similar sounding words can be transcribed inadequately or may not  be corrected upon review.  08/27/2018, 2:38 PM

## 2018-08-28 DIAGNOSIS — I10 Essential (primary) hypertension: Secondary | ICD-10-CM | POA: Diagnosis not present

## 2018-08-28 DIAGNOSIS — R001 Bradycardia, unspecified: Secondary | ICD-10-CM | POA: Diagnosis not present

## 2018-08-28 DIAGNOSIS — E6609 Other obesity due to excess calories: Secondary | ICD-10-CM | POA: Diagnosis not present

## 2018-08-28 DIAGNOSIS — Z6830 Body mass index (BMI) 30.0-30.9, adult: Secondary | ICD-10-CM | POA: Diagnosis not present

## 2018-09-18 ENCOUNTER — Other Ambulatory Visit: Payer: Self-pay | Admitting: "Endocrinology

## 2018-10-10 DIAGNOSIS — E785 Hyperlipidemia, unspecified: Secondary | ICD-10-CM | POA: Diagnosis not present

## 2018-10-10 DIAGNOSIS — E119 Type 2 diabetes mellitus without complications: Secondary | ICD-10-CM | POA: Diagnosis not present

## 2018-10-10 DIAGNOSIS — I10 Essential (primary) hypertension: Secondary | ICD-10-CM | POA: Diagnosis not present

## 2018-10-21 DIAGNOSIS — M25551 Pain in right hip: Secondary | ICD-10-CM | POA: Diagnosis not present

## 2018-10-21 DIAGNOSIS — M25562 Pain in left knee: Secondary | ICD-10-CM | POA: Diagnosis not present

## 2018-10-21 DIAGNOSIS — M25561 Pain in right knee: Secondary | ICD-10-CM | POA: Diagnosis not present

## 2018-10-23 ENCOUNTER — Other Ambulatory Visit: Payer: Self-pay | Admitting: "Endocrinology

## 2018-10-23 IMAGING — US US EXTREM LOW VENOUS*L*
1 series · 13 of 24 positions shown · non-contrast
Comparison: Left lower extremity venous Doppler
ultrasound-03/02/2007; 05/08/2005

CLINICAL DATA: Left lower extremity pain for the past 4-5 weeks.
History of prior DVT. Evaluate for acute or chronic DVT.



[Series 1: us extrem low venous*left* · 0.08mm/px · 13 of 38 slices shown]
[im 1/38]
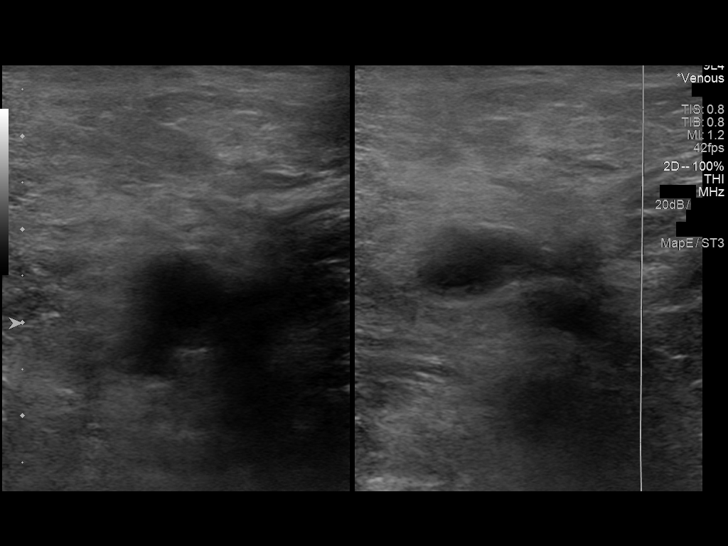
[im 4/38]
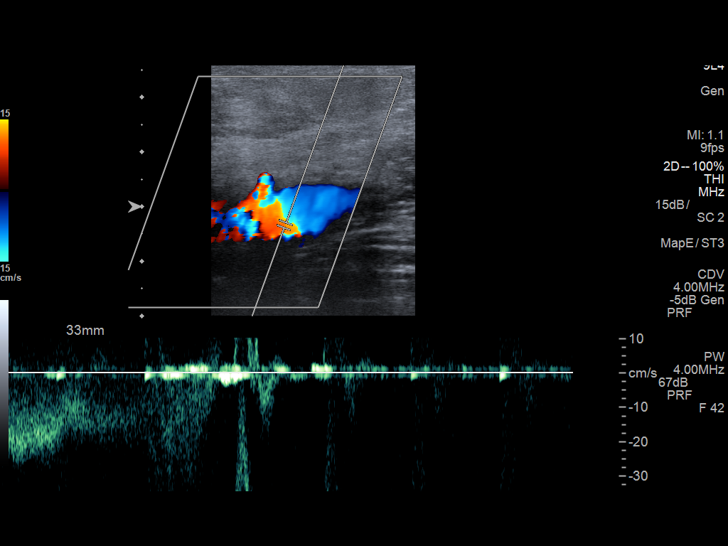
[im 7/38]
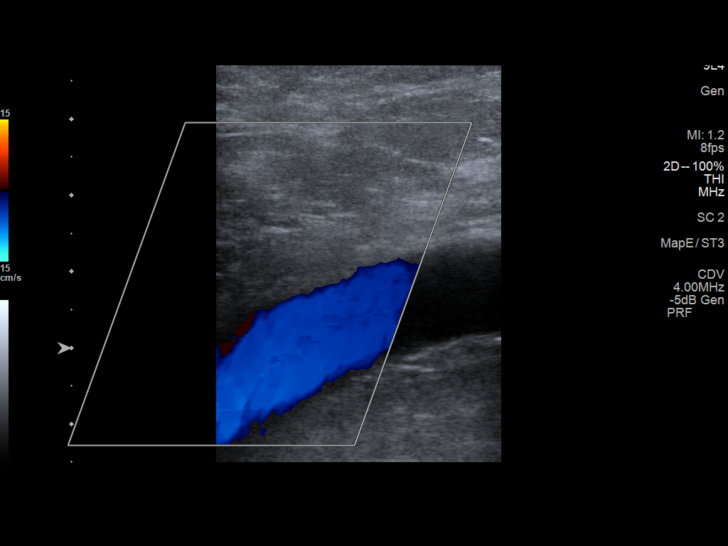
[im 10/38]
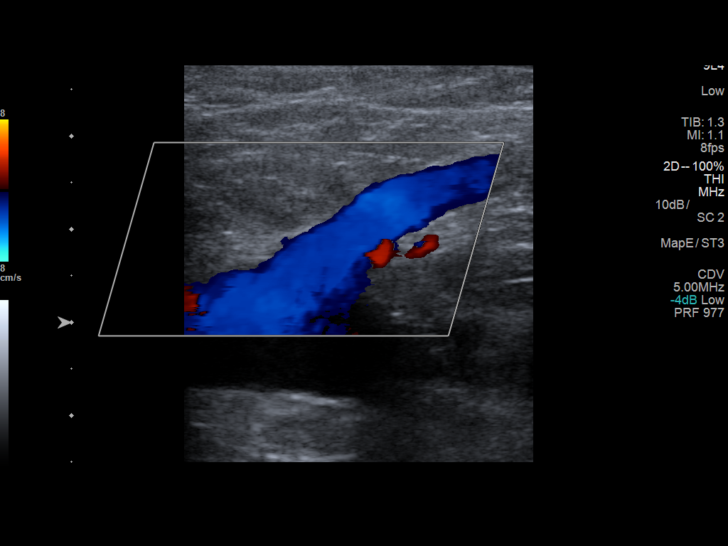
[im 13/38]
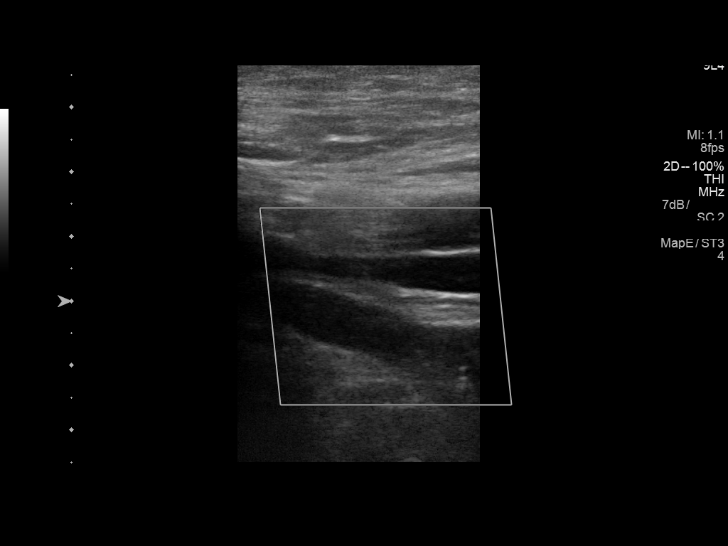
[im 17/38]
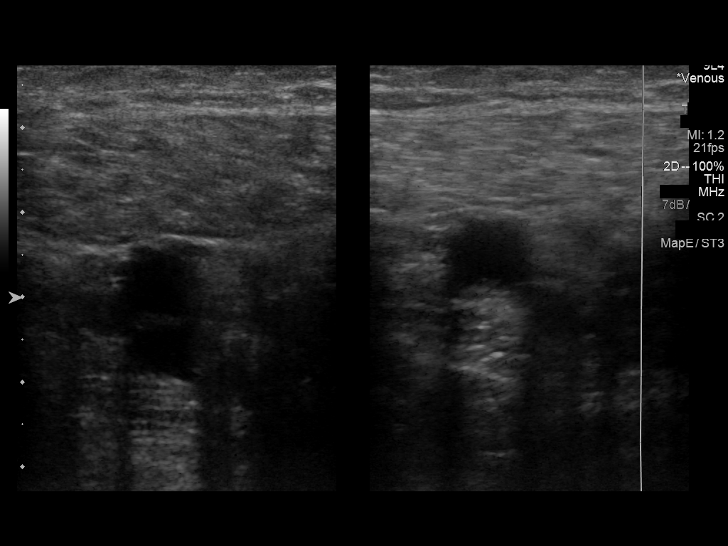
[im 20/38]
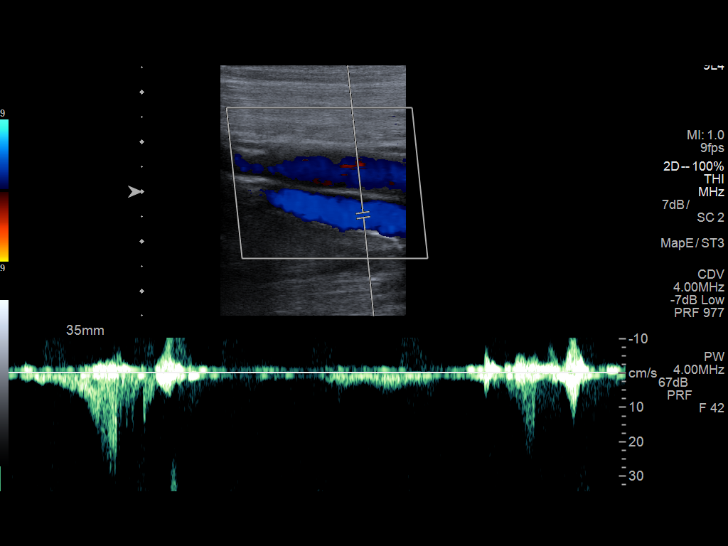
[im 21/38]
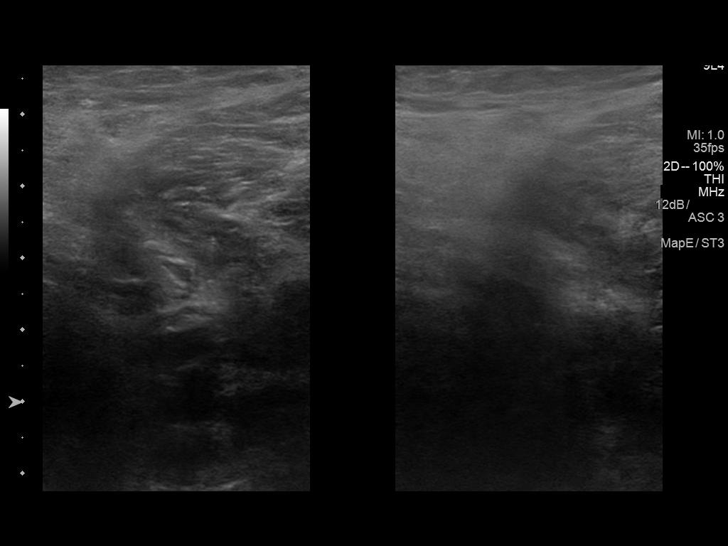
[im 25/38]
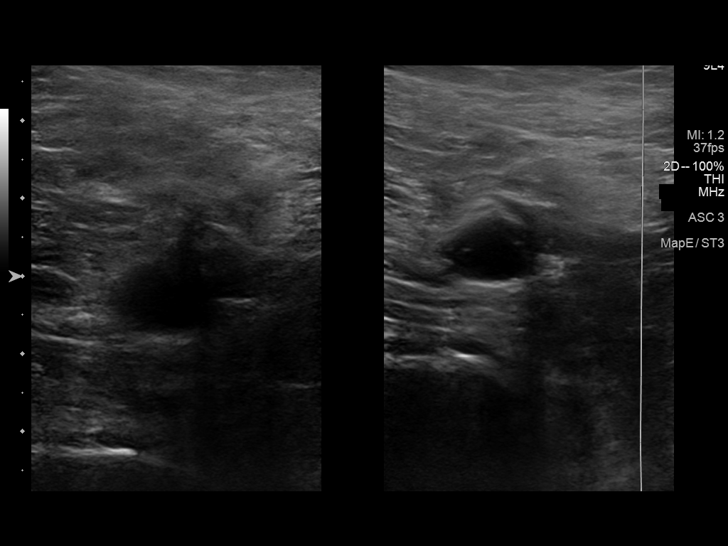
[im 28/38]
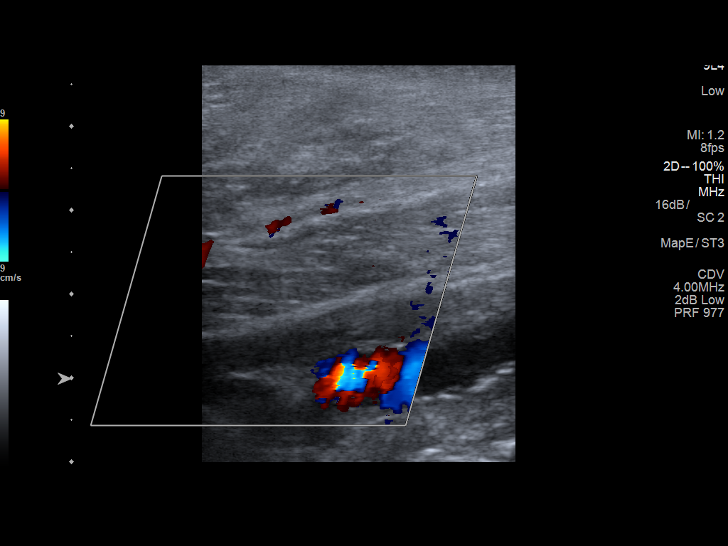
[im 31/38]
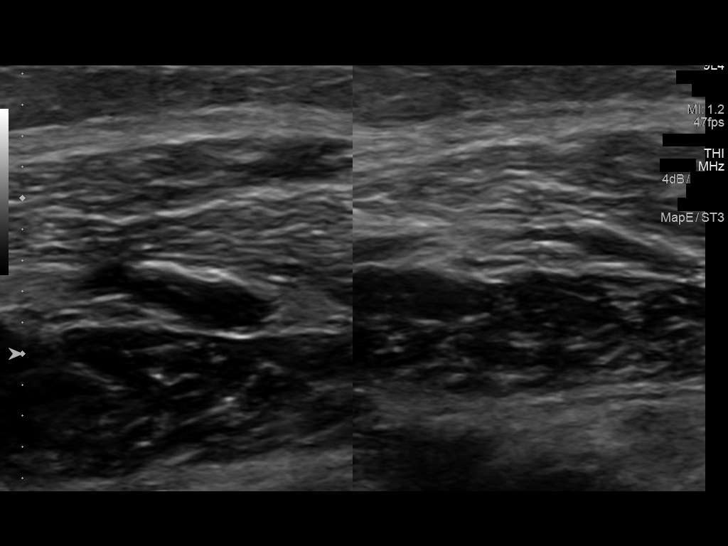
[im 34/38]
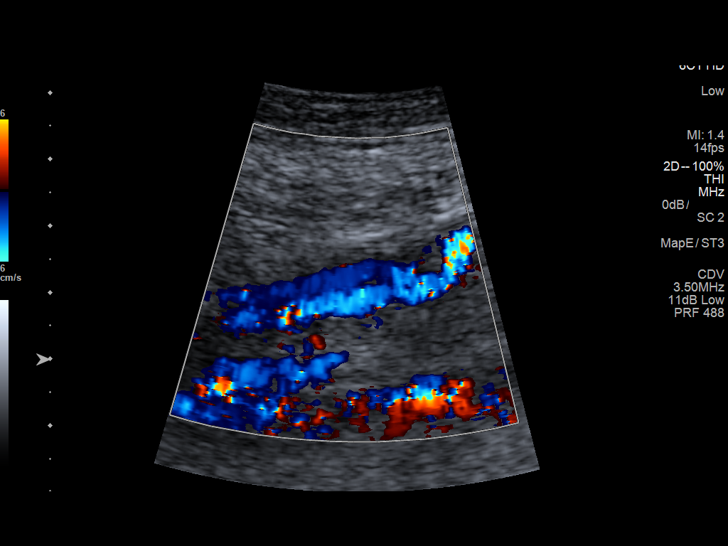
[im 38/38]
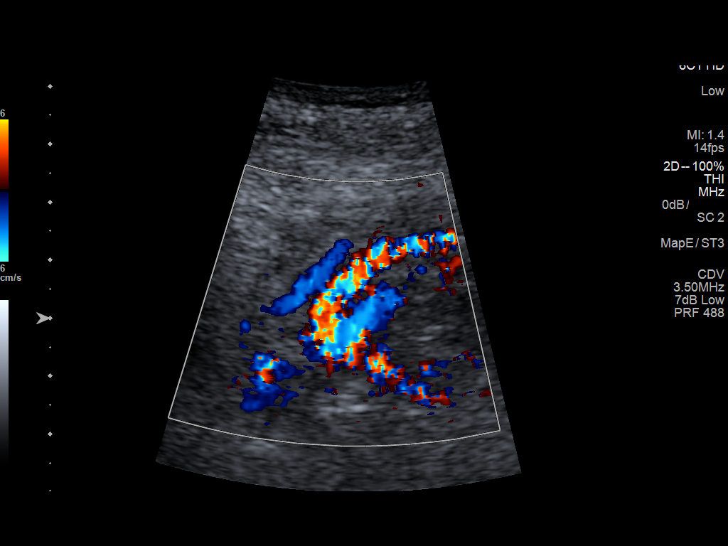

[13 of 24 positions shown; findings below may reference images not displayed]

FINDINGS: Contralateral Common Femoral Vein: Respiratory phasicity is normal
and symmetric with the symptomatic side. No evidence of thrombus.
Normal compressibility.

Common Femoral Vein: No evidence of thrombus. Normal
compressibility, respiratory phasicity and response to augmentation.

Saphenofemoral Junction: No evidence of thrombus. Normal
compressibility and flow on color Doppler imaging.

Profunda Femoral Vein: No evidence of thrombus. Normal
compressibility and flow on color Doppler imaging.

Femoral Vein: No evidence of thrombus. Normal compressibility,
respiratory phasicity and response to augmentation.

Popliteal Vein: There is echogenic occlusive DVT within the left
popliteal vein (images 34 through 38), similar to the [DATE]
examination.

Calf Veins: Appear patent where imaged.

Superficial Great Saphenous Vein: No evidence of thrombus. Normal
compressibility.

Venous Reflux:  None.

Other Findings:  None.
IMPRESSION: 1. Presumably chronic occlusive DVT involving the left popliteal
vein, similar to the [DATE] examination, though in the absence of
more recent prior examinations an acute on chronic process is not
entirely excluded. Clinical correlation is advised.
2. Otherwise, no evidence of acute or worsening DVT within the left
lower extremity.

## 2018-10-29 ENCOUNTER — Other Ambulatory Visit: Payer: Self-pay

## 2018-10-29 DIAGNOSIS — Z20822 Contact with and (suspected) exposure to covid-19: Secondary | ICD-10-CM

## 2018-10-30 LAB — NOVEL CORONAVIRUS, NAA: SARS-CoV-2, NAA: NOT DETECTED

## 2018-11-21 DIAGNOSIS — Z23 Encounter for immunization: Secondary | ICD-10-CM | POA: Diagnosis not present

## 2018-11-21 DIAGNOSIS — R040 Epistaxis: Secondary | ICD-10-CM | POA: Diagnosis not present

## 2018-11-21 DIAGNOSIS — Z6830 Body mass index (BMI) 30.0-30.9, adult: Secondary | ICD-10-CM | POA: Diagnosis not present

## 2018-11-21 DIAGNOSIS — E6609 Other obesity due to excess calories: Secondary | ICD-10-CM | POA: Diagnosis not present

## 2018-12-11 DIAGNOSIS — E785 Hyperlipidemia, unspecified: Secondary | ICD-10-CM | POA: Diagnosis not present

## 2018-12-11 DIAGNOSIS — I10 Essential (primary) hypertension: Secondary | ICD-10-CM | POA: Diagnosis not present

## 2018-12-11 DIAGNOSIS — E119 Type 2 diabetes mellitus without complications: Secondary | ICD-10-CM | POA: Diagnosis not present

## 2018-12-17 DIAGNOSIS — E119 Type 2 diabetes mellitus without complications: Secondary | ICD-10-CM | POA: Diagnosis not present

## 2018-12-17 DIAGNOSIS — M1991 Primary osteoarthritis, unspecified site: Secondary | ICD-10-CM | POA: Diagnosis not present

## 2018-12-17 DIAGNOSIS — E785 Hyperlipidemia, unspecified: Secondary | ICD-10-CM | POA: Diagnosis not present

## 2018-12-17 DIAGNOSIS — Z6830 Body mass index (BMI) 30.0-30.9, adult: Secondary | ICD-10-CM | POA: Diagnosis not present

## 2019-01-14 DIAGNOSIS — E119 Type 2 diabetes mellitus without complications: Secondary | ICD-10-CM | POA: Diagnosis not present

## 2019-01-14 LAB — HM DIABETES EYE EXAM

## 2019-02-05 DIAGNOSIS — C641 Malignant neoplasm of right kidney, except renal pelvis: Secondary | ICD-10-CM | POA: Diagnosis not present

## 2019-02-13 ENCOUNTER — Ambulatory Visit (HOSPITAL_COMMUNITY)
Admission: RE | Admit: 2019-02-13 | Discharge: 2019-02-13 | Disposition: A | Discharge status: Home / Self Care | Payer: PPO | Source: Ambulatory Visit | Attending: Urology | Admitting: Urology

## 2019-02-13 ENCOUNTER — Other Ambulatory Visit: Payer: Self-pay

## 2019-02-13 ENCOUNTER — Other Ambulatory Visit (HOSPITAL_COMMUNITY): Payer: Self-pay | Admitting: Urology

## 2019-02-13 DIAGNOSIS — R3912 Poor urinary stream: Secondary | ICD-10-CM | POA: Diagnosis not present

## 2019-02-13 DIAGNOSIS — C649 Malignant neoplasm of unspecified kidney, except renal pelvis: Secondary | ICD-10-CM | POA: Diagnosis not present

## 2019-02-13 DIAGNOSIS — C641 Malignant neoplasm of right kidney, except renal pelvis: Secondary | ICD-10-CM | POA: Diagnosis not present

## 2019-02-13 DIAGNOSIS — D49512 Neoplasm of unspecified behavior of left kidney: Secondary | ICD-10-CM | POA: Diagnosis not present

## 2019-02-13 DIAGNOSIS — N401 Enlarged prostate with lower urinary tract symptoms: Secondary | ICD-10-CM | POA: Diagnosis not present

## 2019-02-26 DIAGNOSIS — Z794 Long term (current) use of insulin: Secondary | ICD-10-CM | POA: Diagnosis not present

## 2019-02-26 DIAGNOSIS — N183 Chronic kidney disease, stage 3 unspecified: Secondary | ICD-10-CM | POA: Diagnosis not present

## 2019-02-26 DIAGNOSIS — E1122 Type 2 diabetes mellitus with diabetic chronic kidney disease: Secondary | ICD-10-CM | POA: Diagnosis not present

## 2019-02-27 ENCOUNTER — Ambulatory Visit (INDEPENDENT_AMBULATORY_CARE_PROVIDER_SITE_OTHER): Payer: PPO | Admitting: "Endocrinology

## 2019-02-27 ENCOUNTER — Encounter: Payer: Self-pay | Admitting: "Endocrinology

## 2019-02-27 DIAGNOSIS — E782 Mixed hyperlipidemia: Secondary | ICD-10-CM

## 2019-02-27 DIAGNOSIS — Z794 Long term (current) use of insulin: Secondary | ICD-10-CM | POA: Diagnosis not present

## 2019-02-27 DIAGNOSIS — I10 Essential (primary) hypertension: Secondary | ICD-10-CM

## 2019-02-27 DIAGNOSIS — E1121 Type 2 diabetes mellitus with diabetic nephropathy: Secondary | ICD-10-CM

## 2019-02-27 DIAGNOSIS — N1831 Chronic kidney disease, stage 3a: Secondary | ICD-10-CM | POA: Diagnosis not present

## 2019-02-27 LAB — COMPLETE METABOLIC PANEL WITH GFR
AG Ratio: 1.5 (calc) (ref 1.0–2.5)
ALT: 24 U/L (ref 9–46)
AST: 27 U/L (ref 10–35)
Albumin: 4.2 g/dL (ref 3.6–5.1)
Alkaline phosphatase (APISO): 68 U/L (ref 35–144)
BUN/Creatinine Ratio: 9 (calc) (ref 6–22)
BUN: 17 mg/dL (ref 7–25)
CO2: 30 mmol/L (ref 20–32)
Calcium: 9.3 mg/dL (ref 8.6–10.3)
Chloride: 105 mmol/L (ref 98–110)
Creat: 1.92 mg/dL — ABNORMAL HIGH (ref 0.70–1.18)
GFR, Est African American: 38 mL/min/{1.73_m2} — ABNORMAL LOW (ref >=60)
GFR, Est Non African American: 33 mL/min/{1.73_m2} — ABNORMAL LOW (ref >=60)
Globulin: 2.8 g/dL (calc) (ref 1.9–3.7)
Glucose, Bld: 210 mg/dL — ABNORMAL HIGH (ref 65–139)
Potassium: 4.5 mmol/L (ref 3.5–5.3)
Sodium: 142 mmol/L (ref 135–146)
Total Bilirubin: 0.5 mg/dL (ref 0.2–1.2)
Total Protein: 7 g/dL (ref 6.1–8.1)

## 2019-02-27 LAB — HEMOGLOBIN A1C
Hgb A1c MFr Bld: 7.7 % of total Hgb — ABNORMAL HIGH (ref <5.7)
Mean Plasma Glucose: 174 (calc)
eAG (mmol/L): 9.7 (calc)

## 2019-02-27 MED ORDER — NOVOLIN 70/30 (70-30) 100 UNIT/ML ~~LOC~~ SUSP: SUBCUTANEOUS | 2 refills | Status: DC

## 2019-02-27 NOTE — Progress Notes (Signed)
02/27/2019                                                    Endocrinology Telehealth Visit Follow up Note -During COVID -19 Pandemic  This visit type was conducted due to national recommendations for restrictions regarding the COVID-19 Pandemic  in an effort to limit this patient's exposure and mitigate transmission of the corona virus.  Due to his co-morbid illnesses, Hector Rose is at  moderate to high risk for complications without adequate follow up.  This format is felt to be most appropriate for him at this time.  I connected with this patient on 02/27/2019   by telephone and verified that I am speaking with the correct person using two identifiers. Hector Rose, 12-Jun-1940. he has verbally consented to this visit. All issues noted in this document were discussed and addressed. The format was not optimal for physical exam.     Subjective:    Patient ID: Hector Rose, male    DOB: Oct 01, 1940,    Past Medical History:  Diagnosis Date  . Asthma 04-11-11   AS CHILD ONLY  . BPH (benign prostatic hypertrophy) with urinary obstruction   . Carpal tunnel syndrome    patient denies at 05/14/17 preop visit   . Difficult intubation 04-11-11   08-11-97 some issues with intubation/record with chart  . DJD (degenerative joint disease)   . DM (diabetes mellitus) ([place]) 04-11-11   tyep II   . DVT femoral (deep venous thrombosis) with thrombophlebitis ([place]) 04-11-11   ?'09-tx. on Xarelton x 3-4 years   . GERD (gastroesophageal reflux disease) 04-11-11   mild, no med in 1 month  . Gout 04-11-11    ankle 2 yrs ago  . Hemorrhoids   . Hypercholesteremia   . Hypertension   . Internal hemorrhoids   . Keloid 04-11-11   multiple-arms,back, chest  . Pulmonary nodule 04-11-11   "PT. NOT AWARE" -denies problems with breathing  . Renal calculus   . Thyroid disease    hypothyroid   Past Surgical History:  Procedure Laterality Date  . cataract surgery  12/2013  . COLONOSCOPY WITH PROPOFOL N/A 02/05/2017    Procedure: COLONOSCOPY WITH PROPOFOL;  Surgeon: Ladene Artist, MD;  Location: [facility];  Service: Endoscopy;  Laterality: N/A;  . COSMETIC SURGERY  2000   Keloid injections  . CYSTOSCOPY WITH BIOPSY  04/14/2011   Procedure: CYSTOSCOPY WITH BIOPSY;  Surgeon: Dutch Gray, MD;  Location: [facility];  Service: Urology;  Laterality: N/A;  Bladder Biopsies   . CYSTOSCOPY/RETROGRADE/URETEROSCOPY  04/14/2011   Procedure: CYSTOSCOPY/RETROGRADE/URETEROSCOPY;  Surgeon: Dutch Gray, MD;  Location: [facility];  Service: Urology;  Laterality: Bilateral;  (Bil) RPG   . LAPAROSCOPIC NEPHRECTOMY Right 05/21/2017   Procedure: LAPAROSCOPIC RADICAL NEPHRECTOMY;  Surgeon: Raynelle Bring, MD;  Location: [facility];  Service: Urology;  Laterality: Right;  . ortho surgery on fingers  2005 & 2008   Dr. Lorelle Formosa finger release  . PROSTATECTOMY  1999   Dr. Rosana Hoes   Social History   Socioeconomic History  . Marital status: Married    Spouse name: Not on file  . Number of children: 2  . Years of education: Not on file  . Highest education level: Not on file  Occupational History  . Occupation: retired from [employer]  . Smoking  status: Never Smoker  . Smokeless tobacco: Never Used  Substance and Sexual Activity  . Alcohol use: No    Alcohol/week: 0.0 standard drinks  . Drug use: No  . Sexual activity: Yes  Other Topics Concern  . Not on file  Social History Narrative  . Not on file   Social Determinants of Health   Financial Resource Strain:   . Difficulty of Paying Living Expenses: Not on file  Food Insecurity:   . Worried About Charity fundraiser in the Last Year: Not on file  . Ran Out of Food in the Last Year: Not on file  Transportation Needs:   . Lack of Transportation (Medical): Not on file  . Lack of Transportation (Non-Medical): Not on file  Physical Activity:   . Days of Exercise per Week: Not on file  . Minutes of Exercise per Session: Not on file  Stress:   . Feeling of  Stress : Not on file  Social Connections:   . Frequency of Communication with Friends and Family: Not on file  . Frequency of Social Gatherings with Friends and Family: Not on file  . Attends Religious Services: Not on file  . Active Member of Clubs or Organizations: Not on file  . Attends Archivist Meetings: Not on file  . Marital Status: Not on file   Outpatient Encounter Medications as of 02/27/2019  Medication Sig  . allopurinol (ZYLOPRIM) 300 MG tablet TAKE 1 TABLET BY MOUTH ONCE DAILY  . amLODipine (NORVASC) 10 MG tablet TAKE 1 TABLET EVERY MORNING  . BD VEO INSULIN SYR ULTRAFINE 31G X 15/64" 1 ML MISC USE TWICE A DAY AS DIRECTED  . [facility] D3 5000 units capsule TAKE 1 CAPSULE BY MOUTH DAILY  . diphenhydrAMINE (BENADRYL) 25 MG tablet Take 25 mg by mouth daily as needed for allergies.  . fluticasone (CUTIVATE) 0.05 % cream Apply 1 application 2 (two) times a week topically. To face for itching and flaking  . fluticasone (FLONASE) 50 MCG/ACT nasal spray Place 2 sprays daily as needed into both nostrils for allergies or rhinitis.  Marland Kitchen gabapentin (NEURONTIN) 600 MG tablet Take 600-1,200 mg by mouth See admin instructions. Take 1200 mg in the morning and 600 mg at night  . insulin NPH-regular Human (NOVOLIN 70/30) (70-30) 100 UNIT/ML injection Inject 35 units with breakfast, 25 units with supper when premeal blood glucose readings above 90 mg per DL .  Marland Kitchen Lancets (ONETOUCH DELICA PLUS [id number]) MISC USE TO TEST BLOOD SUGAR TWICE DAILY.  . metoprolol (LOPRESSOR) 50 MG tablet TAKE 1 TABLET TWICE DAILY  . ONE TOUCH ULTRA TEST test strip USE TO TEST BLOOD SUGAR TWICE DAILY.  Marland Kitchen Propylene Glycol (SYSTANE BALANCE OP) Apply 1 drop as needed to eye (irritation).  . Rivaroxaban (XARELTO) 15 MG TABS tablet Take 15 mg by mouth daily.   . rosuvastatin (CRESTOR) 20 MG tablet TAKE 1 TABLET BY MOUTH EVERY DAY  . [DISCONTINUED] insulin NPH-regular Human (NOVOLIN 70/30) (70-30) 100 UNIT/ML injection  Inject 30 Units into the skin 2 (two) times daily with a meal.  . [DISCONTINUED] simvastatin (ZOCOR) 40 MG tablet Takes 1/2 tablet daily bedtime   No facility-administered encounter medications on file as of 02/27/2019.   ALLERGIES: Allergies  Allergen Reactions  . Ace Inhibitors Swelling    REACTION: angio edema (swelling of lips)  . Metformin And Related     Itching in higher doses    VACCINATION STATUS: Immunization History  Administered Date(s) Administered  .  Influenza Split 11/27/2011, 12/30/2012, 01/01/2014, 03/13/2017  . Influenza Whole 01/27/2009, 11/30/2009, 11/17/2010  . Influenza, High Dose Seasonal PF 12/21/2015, 01/04/2018  . Pneumococcal Conjugate-13 02/04/2018  . Pneumococcal Polysaccharide-23 12/02/2008    Diabetes He presents for his follow-up diabetic visit. He has type 2 diabetes mellitus. Onset time: Diagnosed approximately  at age 40. His disease course has been fluctuating. Pertinent negatives for hypoglycemia include no confusion, headaches, pallor or seizures. Pertinent negatives for diabetes include no chest pain, no fatigue, no polydipsia, no polyphagia, no polyuria and no weakness. Symptoms are improving. Risk factors for coronary artery disease include diabetes mellitus, dyslipidemia, male sex, hypertension and sedentary lifestyle. Current diabetic treatment includes insulin injections. His weight is decreasing steadily. He is following a generally unhealthy diet. Meal planning includes ADA exchanges. He participates in exercise intermittently. His breakfast blood glucose range is generally 140-180 mg/dl. His dinner blood glucose range is generally 180-200 mg/dl. His bedtime blood glucose range is generally 180-200 mg/dl. His overall blood glucose range is 140-180 mg/dl.  Hypertension This is a chronic problem. The current episode started more than 1 year ago. The problem is controlled. Pertinent negatives include no chest pain, headaches, neck pain,  palpitations or shortness of breath. Past treatments include calcium channel blockers.  Hyperlipidemia This is a chronic problem. The current episode started more than 1 year ago. The problem is uncontrolled. Pertinent negatives include no chest pain, myalgias or shortness of breath. Current antihyperlipidemic treatment includes bile acid squestrants. Risk factors for coronary artery disease include dyslipidemia and diabetes mellitus.     Review of systems: Limited as above.  Objective:    There were no vitals taken for this visit.  Wt Readings from Last 3 Encounters:  08/27/18 219 lb 9.6 oz (99.6 kg)  04/15/18 222 lb (100.7 kg)  03/21/18 225 lb (102.1 kg)     Recent Results (from the past 2160 hour(s))  [facility] DIABETES EYE EXAM     Status: None   Collection Time: 01/14/19 12:11 PM  Result Value Ref Range   [facility] Diabetic Eye Exam    Hemoglobin A1c     Status: Abnormal   Collection Time: 02/26/19  3:10 PM  Result Value Ref Range   Hgb A1c MFr Bld 7.7 (H) <5.7 % of total Hgb    Comment: For someone without known diabetes, a hemoglobin A1c value of 6.5% or greater indicates that they may have  diabetes and this should be confirmed with a follow-up  test. . For someone with known diabetes, a value <7% indicates  that their diabetes is well controlled and a value  greater than or equal to 7% indicates suboptimal  control. A1c targets should be individualized based on  duration of diabetes, age, comorbid conditions, and  other considerations. . Currently, no consensus exists regarding use of hemoglobin A1c for diagnosis of diabetes for children. .    Mean Plasma Glucose 174 (calc)   eAG (mmol/L) 9.7 (calc)  COMPLETE METABOLIC PANEL WITH GFR     Status: Abnormal   Collection Time: 02/26/19  3:10 PM  Result Value Ref Range   Glucose, Bld 210 (H) 65 - 139 mg/dL    Comment: .        Non-fasting reference interval .    BUN 17 7 - 25 mg/dL   Creat 1.92 (H) 0.70 - 1.18 mg/dL     Comment: For patients >34 years of age, the reference limit for Creatinine is approximately 13% higher for people identified as African-American. Marland Kitchen  GFR, Est Non African American 33 (L) > OR = 60 mL/min/1.64m2   GFR, Est African American 38 (L) > OR = 60 mL/min/1.64m2   BUN/Creatinine Ratio 9 6 - 22 (calc)   Sodium 142 135 - 146 mmol/L   Potassium 4.5 3.5 - 5.3 mmol/L   Chloride 105 98 - 110 mmol/L   CO2 30 20 - 32 mmol/L   Calcium 9.3 8.6 - 10.3 mg/dL   Total Protein 7.0 6.1 - 8.1 g/dL   Albumin 4.2 3.6 - 5.1 g/dL   Globulin 2.8 1.9 - 3.7 g/dL (calc)   AG Ratio 1.5 1.0 - 2.5 (calc)   Total Bilirubin 0.5 0.2 - 1.2 mg/dL   Alkaline phosphatase (APISO) 68 35 - 144 U/L   AST 27 10 - 35 U/L   ALT 24 9 - 46 U/L   Lipid Panel     Component Value Date/Time   CHOL 222 (H) 11/20/2017 0745   TRIG 118 11/20/2017 0745   HDL 46 11/20/2017 0745   CHOLHDL 4.8 11/20/2017 0745   VLDL 28 03/17/2016 0714   LDLCALC 152 (H) 11/20/2017 0745   LDLDIRECT 147.5 06/10/2012 0930     Assessment & Plan:   1. Type 2 diabetes mellitus with stage 2 chronic kidney disease He came with fluctuating blood glucose profile, A1c of 7.6%.    He reports controlled glycemic profile before breakfast between 95-1 29.  However he has above target glycemic profile before supper between 179-253.  He has changed around the dose of his insulin.     Patient is advised to stick to the recommended insulin dose and a routine mealtimes to eat 3 meals  a day and avoid unnecessary snacks ( to snack only to correct hypoglycemia).  - he  admits there is a room for improvement in his diet and drink choices. -  Suggestion is made for him to avoid simple carbohydrates  from his diet including Cakes, Sweet Desserts / Pastries, Ice Cream, Soda (diet and regular), Sweet Tea, Candies, Chips, Cookies, Sweet Pastries,  Store Bought Juices, Alcohol in Excess of  1-2 drinks a day, Artificial Sweeteners, Coffee Creamer, and "Sugar-free"  Products. This will help patient to have stable blood glucose profile and potentially avoid unintended weight gain.   -He will be continued on the same insulin regimen, advised to increase Novolin 70/30 to 35 units with breakfast and 25 units with supper  when pre-meal blood glucose is above 90.  - He is advised to continue glucose monitoring at least 2 times daily-before breakfast and at supper and anytime as needed. - Patient is instructed to call back with extremes of blood glucose readings  less than 70 or greater than 300 mg/dl. -He is not a candidate for metformin, SGLT2 inhibitors, nor incretin therapy.  2. Mixed hyperlipidemia - His repeat lipid panel showed significant loss of control to LDL of 166.  He is tolerating Crestor better than pravastatin.  He is advised to continue Crestor 20 mg p.o. nightly.  - He is advised to continue with omega-3 fatty acids ,avoid butter and fried food and exercise regularly.  3. Essential hypertension  His blood pressure is not controlled to target.    He is advised to continue amlodipine.  4) vitamin D deficiency -He is on ongoing supplement with  vitamin D 3 5000 units daily for the next 90 days.  He is advised to continue close follow-up with his PCP Dr. Grier Mitts.   - Patient Care Time Today:  25 min, of which >50% was spent in  counseling and the rest reviewing his  current and  previous labs/studies, previous treatments, his blood glucose readings, and medications' doses and developing a plan for long-term care based on the latest recommendations for standards of care.   Hector Rose participated in the discussions, expressed understanding, and voiced agreement with the above plans.  All questions were answered to his satisfaction. he is encouraged to contact clinic should he have any questions or concerns prior to his return visit.   Follow up plan: Return in about 4 months (around 06/28/2019) for Bring Meter and Logs- A1c in Office,  Include 8 log sheets.  Glade Lloyd, MD Phone: [phone]  Fax: [phone]  -  This note was partially dictated with voice recognition software. Similar sounding words can be transcribed inadequately or may not  be corrected upon review.  02/27/2019, 2:44 PM

## 2019-03-14 ENCOUNTER — Other Ambulatory Visit: Payer: Self-pay | Admitting: "Endocrinology

## 2019-04-21 DIAGNOSIS — Z6830 Body mass index (BMI) 30.0-30.9, adult: Secondary | ICD-10-CM | POA: Diagnosis not present

## 2019-04-21 DIAGNOSIS — E785 Hyperlipidemia, unspecified: Secondary | ICD-10-CM | POA: Diagnosis not present

## 2019-04-21 DIAGNOSIS — Z0000 Encounter for general adult medical examination without abnormal findings: Secondary | ICD-10-CM | POA: Diagnosis not present

## 2019-04-21 DIAGNOSIS — E1129 Type 2 diabetes mellitus with other diabetic kidney complication: Secondary | ICD-10-CM | POA: Diagnosis not present

## 2019-04-21 DIAGNOSIS — E6609 Other obesity due to excess calories: Secondary | ICD-10-CM | POA: Diagnosis not present

## 2019-04-28 DIAGNOSIS — M1712 Unilateral primary osteoarthritis, left knee: Secondary | ICD-10-CM | POA: Diagnosis not present

## 2019-04-28 DIAGNOSIS — M1711 Unilateral primary osteoarthritis, right knee: Secondary | ICD-10-CM | POA: Diagnosis not present

## 2019-04-28 DIAGNOSIS — M25561 Pain in right knee: Secondary | ICD-10-CM | POA: Diagnosis not present

## 2019-04-28 DIAGNOSIS — M79671 Pain in right foot: Secondary | ICD-10-CM | POA: Diagnosis not present

## 2019-04-28 DIAGNOSIS — M170 Bilateral primary osteoarthritis of knee: Secondary | ICD-10-CM | POA: Diagnosis not present

## 2019-05-05 DIAGNOSIS — M25561 Pain in right knee: Secondary | ICD-10-CM | POA: Diagnosis not present

## 2019-05-05 DIAGNOSIS — M1711 Unilateral primary osteoarthritis, right knee: Secondary | ICD-10-CM | POA: Diagnosis not present

## 2019-05-11 DIAGNOSIS — E119 Type 2 diabetes mellitus without complications: Secondary | ICD-10-CM | POA: Diagnosis not present

## 2019-05-11 DIAGNOSIS — E782 Mixed hyperlipidemia: Secondary | ICD-10-CM | POA: Diagnosis not present

## 2019-05-11 DIAGNOSIS — I10 Essential (primary) hypertension: Secondary | ICD-10-CM | POA: Diagnosis not present

## 2019-05-14 DIAGNOSIS — M25561 Pain in right knee: Secondary | ICD-10-CM | POA: Diagnosis not present

## 2019-05-14 DIAGNOSIS — M1711 Unilateral primary osteoarthritis, right knee: Secondary | ICD-10-CM | POA: Diagnosis not present

## 2019-06-25 DIAGNOSIS — M1711 Unilateral primary osteoarthritis, right knee: Secondary | ICD-10-CM | POA: Diagnosis not present

## 2019-06-25 DIAGNOSIS — M170 Bilateral primary osteoarthritis of knee: Secondary | ICD-10-CM | POA: Diagnosis not present

## 2019-06-25 DIAGNOSIS — M1712 Unilateral primary osteoarthritis, left knee: Secondary | ICD-10-CM | POA: Diagnosis not present

## 2019-06-25 DIAGNOSIS — M25561 Pain in right knee: Secondary | ICD-10-CM | POA: Diagnosis not present

## 2019-06-27 ENCOUNTER — Other Ambulatory Visit: Payer: Self-pay

## 2019-06-27 DIAGNOSIS — Z794 Long term (current) use of insulin: Secondary | ICD-10-CM | POA: Diagnosis not present

## 2019-06-27 DIAGNOSIS — N1831 Chronic kidney disease, stage 3a: Secondary | ICD-10-CM | POA: Diagnosis not present

## 2019-06-27 DIAGNOSIS — E1121 Type 2 diabetes mellitus with diabetic nephropathy: Secondary | ICD-10-CM | POA: Diagnosis not present

## 2019-06-28 LAB — HEMOGLOBIN A1C
Hgb A1c MFr Bld: 7.2 % of total Hgb — ABNORMAL HIGH (ref <5.7)
Mean Plasma Glucose: 160 (calc)
eAG (mmol/L): 8.9 (calc)

## 2019-06-28 LAB — COMPREHENSIVE METABOLIC PANEL
AG Ratio: 1.7 (calc) (ref 1.0–2.5)
ALT: 16 U/L (ref 9–46)
AST: 20 U/L (ref 10–35)
Albumin: 4.5 g/dL (ref 3.6–5.1)
Alkaline phosphatase (APISO): 74 U/L (ref 35–144)
BUN/Creatinine Ratio: 7 (calc) (ref 6–22)
BUN: 15 mg/dL (ref 7–25)
CO2: 32 mmol/L (ref 20–32)
Calcium: 9.8 mg/dL (ref 8.6–10.3)
Chloride: 105 mmol/L (ref 98–110)
Creat: 2.06 mg/dL — ABNORMAL HIGH (ref 0.70–1.18)
Globulin: 2.6 g/dL (calc) (ref 1.9–3.7)
Glucose, Bld: 94 mg/dL (ref 65–139)
Potassium: 3.9 mmol/L (ref 3.5–5.3)
Sodium: 145 mmol/L (ref 135–146)
Total Bilirubin: 0.6 mg/dL (ref 0.2–1.2)
Total Protein: 7.1 g/dL (ref 6.1–8.1)

## 2019-06-30 ENCOUNTER — Encounter: Payer: Self-pay | Admitting: "Endocrinology

## 2019-06-30 ENCOUNTER — Ambulatory Visit: Payer: PPO | Admitting: "Endocrinology

## 2019-06-30 ENCOUNTER — Other Ambulatory Visit: Payer: Self-pay

## 2019-06-30 VITALS — BP 170/82 | HR 48 | Ht 73.0 in | Wt 218.2 lb

## 2019-06-30 DIAGNOSIS — E559 Vitamin D deficiency, unspecified: Secondary | ICD-10-CM | POA: Diagnosis not present

## 2019-06-30 DIAGNOSIS — Z794 Long term (current) use of insulin: Secondary | ICD-10-CM

## 2019-06-30 DIAGNOSIS — N1831 Chronic kidney disease, stage 3a: Secondary | ICD-10-CM | POA: Diagnosis not present

## 2019-06-30 DIAGNOSIS — E1121 Type 2 diabetes mellitus with diabetic nephropathy: Secondary | ICD-10-CM | POA: Diagnosis not present

## 2019-06-30 DIAGNOSIS — E782 Mixed hyperlipidemia: Secondary | ICD-10-CM

## 2019-06-30 DIAGNOSIS — I10 Essential (primary) hypertension: Secondary | ICD-10-CM

## 2019-06-30 MED ORDER — NOVOLIN 70/30 (70-30) 100 UNIT/ML ~~LOC~~ SUSP: SUBCUTANEOUS | 2 refills | Status: DC

## 2019-06-30 MED ORDER — HYDROCHLOROTHIAZIDE 25 MG PO TABS: 25.0000 mg | ORAL_TABLET | Freq: Every day | ORAL | 3 refills | Status: DC

## 2019-06-30 NOTE — Progress Notes (Signed)
06/30/2019   Endocrinology follow-up note   Subjective:    Patient ID: Hector Rose, male    DOB: Jan 13, 1941,    Past Medical History:  Diagnosis Date  . Asthma 04-11-11   AS CHILD ONLY  . BPH (benign prostatic hypertrophy) with urinary obstruction   . Carpal tunnel syndrome    patient denies at 05/14/17 preop visit   . Difficult intubation 04-11-11   08-11-97 some issues with intubation/record with chart  . DJD (degenerative joint disease)   . DM (diabetes mellitus) ([place]) 04-11-11   tyep II   . DVT femoral (deep venous thrombosis) with thrombophlebitis ([place]) 04-11-11   ?'09-tx. on Xarelton x 3-4 years   . GERD (gastroesophageal reflux disease) 04-11-11   mild, no med in 1 month  . Gout 04-11-11    ankle 2 yrs ago  . Hemorrhoids   . Hypercholesteremia   . Hypertension   . Internal hemorrhoids   . Keloid 04-11-11   multiple-arms,back, chest  . Pulmonary nodule 04-11-11   "PT. NOT AWARE" -denies problems with breathing  . Renal calculus   . Thyroid disease    hypothyroid   Past Surgical History:  Procedure Laterality Date  . cataract surgery  12/2013  . COLONOSCOPY WITH PROPOFOL N/A 02/05/2017   Procedure: COLONOSCOPY WITH PROPOFOL;  Surgeon: Ladene Artist, MD;  Location: [facility];  Service: Endoscopy;  Laterality: N/A;  . COSMETIC SURGERY  2000   Keloid injections  . CYSTOSCOPY WITH BIOPSY  04/14/2011   Procedure: CYSTOSCOPY WITH BIOPSY;  Surgeon: Dutch Gray, MD;  Location: [facility];  Service: Urology;  Laterality: N/A;  Bladder Biopsies   . CYSTOSCOPY/RETROGRADE/URETEROSCOPY  04/14/2011   Procedure: CYSTOSCOPY/RETROGRADE/URETEROSCOPY;  Surgeon: Dutch Gray, MD;  Location: [facility];  Service: Urology;  Laterality: Bilateral;  (Bil) RPG   . LAPAROSCOPIC NEPHRECTOMY Right 05/21/2017   Procedure: LAPAROSCOPIC RADICAL NEPHRECTOMY;  Surgeon: Raynelle Bring, MD;  Location: [facility];  Service: Urology;  Laterality: Right;  . ortho surgery on fingers  2005 & 2008   Dr. Lorelle Formosa  finger release  . PROSTATECTOMY  1999   Dr. Rosana Hoes   Social History   Socioeconomic History  . Marital status: Married    Spouse name: Not on file  . Number of children: 2  . Years of education: Not on file  . Highest education level: Not on file  Occupational History  . Occupation: retired from [employer] Use  . Smoking status: Never Smoker  . Smokeless tobacco: Never Used  Substance and Sexual Activity  . Alcohol use: No    Alcohol/week: 0.0 standard drinks  . Drug use: No  . Sexual activity: Yes  Other Topics Concern  . Not on file  Social History Narrative  . Not on file   Social Determinants of Health   Financial Resource Strain:   . Difficulty of Paying Living Expenses:   Food Insecurity:   . Worried About Charity fundraiser in the Last Year:   . Arboriculturist in the Last Year:   Transportation Needs:   . Film/video editor (Medical):   Marland Kitchen Lack of Transportation (Non-Medical):   Physical Activity:   . Days of Exercise per Week:   . Minutes of Exercise per Session:   Stress:   . Feeling of Stress :   Social Connections:   . Frequency of Communication with Friends and Family:   . Frequency of Social Gatherings with Friends and Family:   . Attends  Religious Services:   . Active Member of Clubs or Organizations:   . Attends Archivist Meetings:   Marland Kitchen Marital Status:    Outpatient Encounter Medications as of 06/30/2019  Medication Sig  . allopurinol (ZYLOPRIM) 300 MG tablet TAKE 1 TABLET BY MOUTH ONCE DAILY  . amLODipine (NORVASC) 10 MG tablet TAKE 1 TABLET EVERY MORNING  . BD VEO INSULIN SYR ULTRAFINE 31G X 15/64" 1 ML MISC USE TWICE A DAY AS DIRECTED  . [facility] D3 5000 units capsule TAKE 1 CAPSULE BY MOUTH DAILY  . diphenhydrAMINE (BENADRYL) 25 MG tablet Take 25 mg by mouth daily as needed for allergies.  . fluticasone (CUTIVATE) 0.05 % cream Apply 1 application 2 (two) times a week topically. To face for itching and flaking  . fluticasone  (FLONASE) 50 MCG/ACT nasal spray Place 2 sprays daily as needed into both nostrils for allergies or rhinitis.  Marland Kitchen gabapentin (NEURONTIN) 600 MG tablet Take 600-1,200 mg by mouth See admin instructions. Take 1200 mg in the morning and 600 mg at night  . hydrochlorothiazide (HYDRODIURIL) 25 MG tablet Take 1 tablet (25 mg total) by mouth daily.  . insulin NPH-regular Human (NOVOLIN 70/30) (70-30) 100 UNIT/ML injection Inject 35 units with breakfast, 25 units with supper when premeal blood glucose readings above 90 mg per DL .  Marland Kitchen Lancets (ONETOUCH DELICA PLUS [id number]) MISC USE TO TEST BLOOD SUGAR TWICE DAILY.  . metoprolol (LOPRESSOR) 50 MG tablet TAKE 1 TABLET TWICE DAILY  . ONE TOUCH ULTRA TEST test strip USE TO TEST BLOOD SUGAR TWICE DAILY.  Marland Kitchen Propylene Glycol (SYSTANE BALANCE OP) Apply 1 drop as needed to eye (irritation).  . Rivaroxaban (XARELTO) 15 MG TABS tablet Take 15 mg by mouth daily.   . rosuvastatin (CRESTOR) 20 MG tablet TAKE 1 TABLET BY MOUTH EVERY DAY  . [DISCONTINUED] simvastatin (ZOCOR) 40 MG tablet Takes 1/2 tablet daily bedtime   No facility-administered encounter medications on file as of 06/30/2019.   ALLERGIES: Allergies  Allergen Reactions  . Ace Inhibitors Swelling    REACTION: angio edema (swelling of lips)  . Metformin And Related     Itching in higher doses    VACCINATION STATUS: Immunization History  Administered Date(s) Administered  . Influenza Split 11/27/2011, 12/30/2012, 01/01/2014, 03/13/2017  . Influenza Whole 01/27/2009, 11/30/2009, 11/17/2010  . Influenza, High Dose Seasonal PF 12/21/2015, 01/04/2018  . Pneumococcal Conjugate-13 02/04/2018  . Pneumococcal Polysaccharide-23 12/02/2008    Diabetes He presents for his follow-up diabetic visit. He has type 2 diabetes mellitus. Onset time: Diagnosed approximately  at age 35. His disease course has been improving. Pertinent negatives for hypoglycemia include no confusion, headaches, pallor or seizures.  Pertinent negatives for diabetes include no chest pain, no fatigue, no polydipsia, no polyphagia, no polyuria and no weakness. Symptoms are improving. Diabetic complications include nephropathy. Risk factors for coronary artery disease include diabetes mellitus, dyslipidemia, male sex, hypertension and sedentary lifestyle. Current diabetic treatment includes insulin injections. His weight is decreasing steadily. He is following a generally unhealthy diet. Meal planning includes ADA exchanges. He participates in exercise intermittently. His breakfast blood glucose range is generally 140-180 mg/dl. His dinner blood glucose range is generally 180-200 mg/dl. His bedtime blood glucose range is generally 180-200 mg/dl. His overall blood glucose range is 140-180 mg/dl.  Hypertension This is a chronic problem. The current episode started more than 1 year ago. The problem is controlled. Pertinent negatives include no chest pain, headaches, neck pain, palpitations or shortness of breath. Past  treatments include calcium channel blockers.  Hyperlipidemia This is a chronic problem. The current episode started more than 1 year ago. The problem is uncontrolled. Pertinent negatives include no chest pain, myalgias or shortness of breath. Current antihyperlipidemic treatment includes bile acid squestrants. Risk factors for coronary artery disease include dyslipidemia and diabetes mellitus.     Review of systems  Constitutional: + Minimally fluctuating body weight,  current  Body mass index is 28.79 kg/m. , no fatigue, no subjective hyperthermia, no subjective hypothermia Eyes: no blurry vision, no xerophthalmia ENT: no sore throat, no nodules palpated in throat, no dysphagia/odynophagia, no hoarseness Cardiovascular: no Chest Pain, no Shortness of Breath, no palpitations, no leg swelling Respiratory: no cough, no shortness of breath Gastrointestinal: no Nausea/Vomiting/Diarhhea Musculoskeletal: no muscle/joint  aches Skin: no rashes, no hyperemia Neurological: no tremors, no numbness, no tingling, no dizziness Psychiatric: no depression, no anxiety   Objective:    BP (!) 170/82   Pulse (!) 48   Ht 6\' 1"  (1.854 m)   Wt 218 lb 3.2 oz (99 kg)   BMI 28.79 kg/m   Wt Readings from Last 3 Encounters:  06/30/19 218 lb 3.2 oz (99 kg)  08/27/18 219 lb 9.6 oz (99.6 kg)  04/15/18 222 lb (100.7 kg)     Physical Exam- Limited  Constitutional:  Body mass index is 28.79 kg/m. , not in acute distress, normal state of mind Eyes:  EOMI, no exophthalmos Neck: Supple Thyroid: No gross goiter Respiratory: Adequate breathing efforts Musculoskeletal: + Moderate, pitting pretibial edema bilaterally, no gross deformities, strength intact in all four extremities, no gross restriction of joint movements Skin:  no rashes, no hyperemia Neurological: no tremor with outstretched hands,   Recent Results (from the past 2160 hour(s))  Comprehensive metabolic panel     Status: Abnormal   Collection Time: 06/27/19 10:01 AM  Result Value Ref Range   Glucose, Bld 94 65 - 139 mg/dL    Comment: .        Non-fasting reference interval .    BUN 15 7 - 25 mg/dL   Creat 2.06 (H) 0.70 - 1.18 mg/dL    Comment: For patients >71 years of age, the reference limit for Creatinine is approximately 13% higher for people identified as African-American. .    BUN/Creatinine Ratio 7 6 - 22 (calc)   Sodium 145 135 - 146 mmol/L   Potassium 3.9 3.5 - 5.3 mmol/L   Chloride 105 98 - 110 mmol/L   CO2 32 20 - 32 mmol/L   Calcium 9.8 8.6 - 10.3 mg/dL   Total Protein 7.1 6.1 - 8.1 g/dL   Albumin 4.5 3.6 - 5.1 g/dL   Globulin 2.6 1.9 - 3.7 g/dL (calc)   AG Ratio 1.7 1.0 - 2.5 (calc)   Total Bilirubin 0.6 0.2 - 1.2 mg/dL   Alkaline phosphatase (APISO) 74 35 - 144 U/L   AST 20 10 - 35 U/L   ALT 16 9 - 46 U/L  Hemoglobin A1c     Status: Abnormal   Collection Time: 06/27/19 10:01 AM  Result Value Ref Range   Hgb A1c MFr Bld 7.2  (H) <5.7 % of total Hgb    Comment: For someone without known diabetes, a hemoglobin A1c value of 6.5% or greater indicates that they may have  diabetes and this should be confirmed with a follow-up  test. . For someone with known diabetes, a value <7% indicates  that their diabetes is well controlled and a value  greater than or equal to 7% indicates suboptimal  control. A1c targets should be individualized based on  duration of diabetes, age, comorbid conditions, and  other considerations. . Currently, no consensus exists regarding use of hemoglobin A1c for diagnosis of diabetes for children. .    Mean Plasma Glucose 160 (calc)   eAG (mmol/L) 8.9 (calc)   Lipid Panel     Component Value Date/Time   CHOL 222 (H) 11/20/2017 0745   TRIG 118 11/20/2017 0745   HDL 46 11/20/2017 0745   CHOLHDL 4.8 11/20/2017 0745   VLDL 28 03/17/2016 0714   LDLCALC 152 (H) 11/20/2017 0745   LDLDIRECT 147.5 06/10/2012 0930     Assessment & Plan:   1. Type 2 diabetes mellitus with stage 2 chronic kidney disease He came with improved glycemic control at fasting and postprandial.  His previsit A1c 7.2% improving from 7.7% during his last visit.      Patient is advised to stick to the recommended insulin dose and a routine mealtimes to eat 3 meals  a day and avoid unnecessary snacks ( to snack only to correct hypoglycemia).  - he  admits there is a room for improvement in his diet and drink choices. -  Suggestion is made for him to avoid simple carbohydrates  from his diet including Cakes, Sweet Desserts / Pastries, Ice Cream, Soda (diet and regular), Sweet Tea, Candies, Chips, Cookies, Sweet Pastries,  Store Bought Juices, Alcohol in Excess of  1-2 drinks a day, Artificial Sweeteners, Coffee Creamer, and "Sugar-free" Products. This will help patient to have stable blood glucose profile and potentially avoid unintended weight gain.   -He worries about hypoglycemia, and he is at risk for  hypoglycemia from inadvertent use of insulin.  -He is advised to continue on the lower dose of his Novolin 70/30 at 25 units with breakfast and 25 units with supper when pre-meal blood glucose is above 90.  - He is advised to continue glucose monitoring at least 2 times daily-before breakfast and at supper and anytime as needed. - Patient is instructed to call back with extremes of blood glucose readings  less than 70 or greater than 300 mg/dl. -He is not a candidate for Metformin, SGLT2 inhibitors, nor incretin therapy.     2. Mixed hyperlipidemia - His repeat lipid panel showed significant loss of control to LDL of 166.  He is tolerating Crestor better than pravastatin.  He is advised to continue Crestor 20 mg p.o. nightly.    - He is advised to continue with omega-3 fatty acids ,avoid butter and fried food and exercise regularly.  3. Essential hypertension  His blood pressure is not controlled to target.  He is currently on amlodipine 10 mg p.o. daily, metoprolol 50 mg daily.  I discussed and added hydrochlorothiazide 25 mg p.o. daily at breakfast for better control of hypertension.    4) vitamin D deficiency -He is on ongoing supplement with vitamin D3  5000 units daily for the next 90 days.  He is advised to continue close follow-up with his PCP Dr. Grier Mitts.   -- Time spent on this patient care encounter:  35 min, of which > 50% was spent in  counseling and the rest reviewing his blood glucose logs , discussing his hypoglycemia and hyperglycemia episodes, reviewing his current and  previous labs / studies  ( including abstraction from other facilities) and medications  doses and developing a  long term treatment plan and documenting his care.  Please refer to Patient Instructions for Blood Glucose Monitoring and Insulin/Medications Dosing Guide"  in media tab for additional information. Please  also refer to " Patient Self Inventory" in the Media  tab for reviewed elements of  pertinent patient history.  Grace Blight participated in the discussions, expressed understanding, and voiced agreement with the above plans.  All questions were answered to his satisfaction. he is encouraged to contact clinic should he have any questions or concerns prior to his return visit.    Follow up plan: Return in about 4 months (around 10/30/2019) for Bring Meter and Logs- A1c in Office.  Glade Lloyd, MD Phone: [phone]  Fax: [phone]  -  This note was partially dictated with voice recognition software. Similar sounding words can be transcribed inadequately or may not  be corrected upon review.  06/30/2019, 10:49 AM

## 2019-06-30 NOTE — Patient Instructions (Signed)

## 2019-07-02 ENCOUNTER — Other Ambulatory Visit: Payer: Self-pay | Admitting: "Endocrinology

## 2019-07-08 DIAGNOSIS — E1140 Type 2 diabetes mellitus with diabetic neuropathy, unspecified: Secondary | ICD-10-CM | POA: Diagnosis not present

## 2019-07-08 DIAGNOSIS — J029 Acute pharyngitis, unspecified: Secondary | ICD-10-CM | POA: Diagnosis not present

## 2019-07-08 DIAGNOSIS — J019 Acute sinusitis, unspecified: Secondary | ICD-10-CM | POA: Diagnosis not present

## 2019-07-08 DIAGNOSIS — E663 Overweight: Secondary | ICD-10-CM | POA: Diagnosis not present

## 2019-07-08 DIAGNOSIS — Z6829 Body mass index (BMI) 29.0-29.9, adult: Secondary | ICD-10-CM | POA: Diagnosis not present

## 2019-07-17 ENCOUNTER — Other Ambulatory Visit: Payer: Self-pay | Admitting: Urology

## 2019-07-17 DIAGNOSIS — C641 Malignant neoplasm of right kidney, except renal pelvis: Secondary | ICD-10-CM

## 2019-07-17 DIAGNOSIS — D49512 Neoplasm of unspecified behavior of left kidney: Secondary | ICD-10-CM

## 2019-08-06 DIAGNOSIS — M25562 Pain in left knee: Secondary | ICD-10-CM | POA: Diagnosis not present

## 2019-08-06 DIAGNOSIS — M25561 Pain in right knee: Secondary | ICD-10-CM | POA: Diagnosis not present

## 2019-08-15 ENCOUNTER — Other Ambulatory Visit: Payer: Self-pay

## 2019-08-15 ENCOUNTER — Ambulatory Visit
Admission: RE | Admit: 2019-08-15 | Discharge: 2019-08-15 | Disposition: A | Discharge status: Home / Self Care | Payer: PPO | Source: Ambulatory Visit | Attending: Urology | Admitting: Urology

## 2019-08-15 DIAGNOSIS — K760 Fatty (change of) liver, not elsewhere classified: Secondary | ICD-10-CM | POA: Diagnosis not present

## 2019-08-15 DIAGNOSIS — C641 Malignant neoplasm of right kidney, except renal pelvis: Secondary | ICD-10-CM

## 2019-08-15 DIAGNOSIS — N2889 Other specified disorders of kidney and ureter: Secondary | ICD-10-CM | POA: Diagnosis not present

## 2019-08-15 DIAGNOSIS — D49512 Neoplasm of unspecified behavior of left kidney: Secondary | ICD-10-CM

## 2019-08-15 MED ORDER — GADOBENATE DIMEGLUMINE 529 MG/ML IV SOLN
20.0000 mL | Freq: Once | INTRAVENOUS | Status: AC | PRN
  Administered 2019-08-15: 20 mL via INTRAVENOUS

## 2019-08-20 DIAGNOSIS — M25562 Pain in left knee: Secondary | ICD-10-CM | POA: Diagnosis not present

## 2019-08-20 DIAGNOSIS — M1712 Unilateral primary osteoarthritis, left knee: Secondary | ICD-10-CM | POA: Diagnosis not present

## 2019-08-22 ENCOUNTER — Other Ambulatory Visit: Payer: Self-pay | Admitting: "Endocrinology

## 2019-08-27 DIAGNOSIS — M1712 Unilateral primary osteoarthritis, left knee: Secondary | ICD-10-CM | POA: Diagnosis not present

## 2019-08-27 DIAGNOSIS — D49512 Neoplasm of unspecified behavior of left kidney: Secondary | ICD-10-CM | POA: Diagnosis not present

## 2019-08-27 DIAGNOSIS — R3912 Poor urinary stream: Secondary | ICD-10-CM | POA: Diagnosis not present

## 2019-08-27 DIAGNOSIS — Z85528 Personal history of other malignant neoplasm of kidney: Secondary | ICD-10-CM | POA: Diagnosis not present

## 2019-08-27 DIAGNOSIS — M25562 Pain in left knee: Secondary | ICD-10-CM | POA: Diagnosis not present

## 2019-08-27 DIAGNOSIS — N401 Enlarged prostate with lower urinary tract symptoms: Secondary | ICD-10-CM | POA: Diagnosis not present

## 2019-09-04 DIAGNOSIS — M25562 Pain in left knee: Secondary | ICD-10-CM | POA: Diagnosis not present

## 2019-09-04 DIAGNOSIS — M1712 Unilateral primary osteoarthritis, left knee: Secondary | ICD-10-CM | POA: Diagnosis not present

## 2019-09-04 DIAGNOSIS — M79671 Pain in right foot: Secondary | ICD-10-CM | POA: Diagnosis not present

## 2019-09-16 DIAGNOSIS — J329 Chronic sinusitis, unspecified: Secondary | ICD-10-CM | POA: Diagnosis not present

## 2019-09-16 DIAGNOSIS — E1140 Type 2 diabetes mellitus with diabetic neuropathy, unspecified: Secondary | ICD-10-CM | POA: Diagnosis not present

## 2019-09-16 DIAGNOSIS — N401 Enlarged prostate with lower urinary tract symptoms: Secondary | ICD-10-CM | POA: Diagnosis not present

## 2019-09-16 DIAGNOSIS — Z6830 Body mass index (BMI) 30.0-30.9, adult: Secondary | ICD-10-CM | POA: Diagnosis not present

## 2019-09-25 ENCOUNTER — Other Ambulatory Visit: Payer: Self-pay | Admitting: "Endocrinology

## 2019-10-02 ENCOUNTER — Other Ambulatory Visit: Payer: Self-pay | Admitting: "Endocrinology

## 2019-10-21 ENCOUNTER — Other Ambulatory Visit: Payer: Self-pay | Admitting: "Endocrinology

## 2019-10-22 DIAGNOSIS — M25562 Pain in left knee: Secondary | ICD-10-CM | POA: Diagnosis not present

## 2019-10-29 ENCOUNTER — Other Ambulatory Visit: Payer: Self-pay | Admitting: Nurse Practitioner

## 2019-10-29 DIAGNOSIS — E559 Vitamin D deficiency, unspecified: Secondary | ICD-10-CM

## 2019-10-29 DIAGNOSIS — E7849 Other hyperlipidemia: Secondary | ICD-10-CM | POA: Diagnosis not present

## 2019-10-29 DIAGNOSIS — E1121 Type 2 diabetes mellitus with diabetic nephropathy: Secondary | ICD-10-CM | POA: Diagnosis not present

## 2019-10-29 DIAGNOSIS — N1831 Chronic kidney disease, stage 3a: Secondary | ICD-10-CM | POA: Diagnosis not present

## 2019-10-29 DIAGNOSIS — Z794 Long term (current) use of insulin: Secondary | ICD-10-CM | POA: Diagnosis not present

## 2019-10-30 LAB — MICROALBUMIN / CREATININE URINE RATIO
Creatinine, Urine: 108.8 mg/dL
Microalb/Creat Ratio: 336 mg/g creat — ABNORMAL HIGH (ref 0–29)
Microalbumin, Urine: 365.6 ug/mL

## 2019-10-30 LAB — LIPID PANEL
Chol/HDL Ratio: 3.4 ratio (ref 0.0–5.0)
Cholesterol, Total: 155 mg/dL (ref 100–199)
HDL: 46 mg/dL (ref >39)
LDL Chol Calc (NIH): 92 mg/dL (ref 0–99)
Triglycerides: 89 mg/dL (ref 0–149)
VLDL Cholesterol Cal: 17 mg/dL (ref 5–40)

## 2019-10-30 LAB — COMPREHENSIVE METABOLIC PANEL
ALT: 21 IU/L (ref 0–44)
AST: 23 IU/L (ref 0–40)
Albumin/Globulin Ratio: 1.8 (ref 1.2–2.2)
Albumin: 4.7 g/dL (ref 3.7–4.7)
Alkaline Phosphatase: 84 IU/L (ref 48–121)
BUN/Creatinine Ratio: 11 (ref 10–24)
BUN: 22 mg/dL (ref 8–27)
Bilirubin Total: 0.5 mg/dL (ref 0.0–1.2)
CO2: 31 mmol/L — ABNORMAL HIGH (ref 20–29)
Calcium: 9.9 mg/dL (ref 8.6–10.2)
Chloride: 101 mmol/L (ref 96–106)
Creatinine, Ser: 2.02 mg/dL — ABNORMAL HIGH (ref 0.76–1.27)
GFR calc Af Amer: 35 mL/min/{1.73_m2} — ABNORMAL LOW (ref >59)
GFR calc non Af Amer: 31 mL/min/{1.73_m2} — ABNORMAL LOW (ref >59)
Globulin, Total: 2.6 g/dL (ref 1.5–4.5)
Glucose: 120 mg/dL — ABNORMAL HIGH (ref 65–99)
Potassium: 4.3 mmol/L (ref 3.5–5.2)
Sodium: 144 mmol/L (ref 134–144)
Total Protein: 7.3 g/dL (ref 6.0–8.5)

## 2019-10-30 LAB — TSH: TSH: 1.53 u[IU]/mL (ref 0.450–4.500)

## 2019-10-30 LAB — VITAMIN D 25 HYDROXY (VIT D DEFICIENCY, FRACTURES): Vit D, 25-Hydroxy: 30.7 ng/mL (ref 30.0–100.0)

## 2019-10-30 LAB — HEMOGLOBIN A1C
Est. average glucose Bld gHb Est-mCnc: 174 mg/dL
Hgb A1c MFr Bld: 7.7 % — ABNORMAL HIGH (ref 4.8–5.6)

## 2019-10-30 LAB — T4, FREE: Free T4: 0.93 ng/dL (ref 0.82–1.77)

## 2019-11-03 ENCOUNTER — Telehealth (INDEPENDENT_AMBULATORY_CARE_PROVIDER_SITE_OTHER): Payer: PPO | Admitting: "Endocrinology

## 2019-11-03 ENCOUNTER — Encounter: Payer: Self-pay | Admitting: "Endocrinology

## 2019-11-03 VITALS — Ht 73.0 in

## 2019-11-03 DIAGNOSIS — Z794 Long term (current) use of insulin: Secondary | ICD-10-CM | POA: Diagnosis not present

## 2019-11-03 DIAGNOSIS — E559 Vitamin D deficiency, unspecified: Secondary | ICD-10-CM | POA: Diagnosis not present

## 2019-11-03 DIAGNOSIS — N1831 Chronic kidney disease, stage 3a: Secondary | ICD-10-CM

## 2019-11-03 DIAGNOSIS — E1121 Type 2 diabetes mellitus with diabetic nephropathy: Secondary | ICD-10-CM

## 2019-11-03 DIAGNOSIS — I10 Essential (primary) hypertension: Secondary | ICD-10-CM

## 2019-11-03 DIAGNOSIS — E782 Mixed hyperlipidemia: Secondary | ICD-10-CM

## 2019-11-03 MED ORDER — ONETOUCH ULTRA VI STRP: ORAL_STRIP | 5 refills | Status: DC

## 2019-11-03 NOTE — Progress Notes (Signed)
11/03/2019                                                     Endocrinology Telehealth Visit Follow up Note -During COVID -19 Pandemic  This visit type was conducted  via telephone due to national recommendations for restrictions regarding the COVID-19 Pandemic  in an effort to limit this patient's exposure and mitigate transmission of the corona virus.  Due to his co-morbid illnesses, Hector Rose is at  moderate to high risk for complications without adequate follow up.  This format is felt to be most appropriate for him at this time.  I connected with this patient on 11/03/2019   by telephone and verified that I am speaking with the correct person using two identifiers. Hector Rose, 1940/10/02. he has verbally consented to this visit. I was in my office and patient was in his residence. No other persons were with me during the encounter. All issues noted in this document were discussed and addressed. The format was not optimal for physical exam.    Subjective:    Patient ID: Hector Rose, male    DOB: 1940-05-10,    Past Medical History:  Diagnosis Date  . Asthma 04-11-11   AS CHILD ONLY  . BPH (benign prostatic hypertrophy) with urinary obstruction   . Carpal tunnel syndrome    patient denies at 05/14/17 preop visit   . Difficult intubation 04-11-11   08-11-97 some issues with intubation/record with chart  . DJD (degenerative joint disease)   . DM (diabetes mellitus) ([place]) 04-11-11   tyep II   . DVT femoral (deep venous thrombosis) with thrombophlebitis ([place]) 04-11-11   ?'09-tx. on Xarelton x 3-4 years   . GERD (gastroesophageal reflux disease) 04-11-11   mild, no med in 1 month  . Gout 04-11-11    ankle 2 yrs ago  . Hemorrhoids   . Hypercholesteremia   . Hypertension   . Internal hemorrhoids   . Keloid 04-11-11   multiple-arms,back, chest  . Pulmonary nodule 04-11-11   "PT. NOT AWARE" -denies problems with breathing  . Renal calculus   . Thyroid disease    hypothyroid   Past  Surgical History:  Procedure Laterality Date  . cataract surgery  12/2013  . COLONOSCOPY WITH PROPOFOL N/A 02/05/2017   Procedure: COLONOSCOPY WITH PROPOFOL;  Surgeon: Ladene Artist, MD;  Location: [facility];  Service: Endoscopy;  Laterality: N/A;  . COSMETIC SURGERY  2000   Keloid injections  . CYSTOSCOPY WITH BIOPSY  04/14/2011   Procedure: CYSTOSCOPY WITH BIOPSY;  Surgeon: Dutch Gray, MD;  Location: [facility];  Service: Urology;  Laterality: N/A;  Bladder Biopsies   . CYSTOSCOPY/RETROGRADE/URETEROSCOPY  04/14/2011   Procedure: CYSTOSCOPY/RETROGRADE/URETEROSCOPY;  Surgeon: Dutch Gray, MD;  Location: [facility];  Service: Urology;  Laterality: Bilateral;  (Bil) RPG   . LAPAROSCOPIC NEPHRECTOMY Right 05/21/2017   Procedure: LAPAROSCOPIC RADICAL NEPHRECTOMY;  Surgeon: Raynelle Bring, MD;  Location: [facility];  Service: Urology;  Laterality: Right;  . ortho surgery on fingers  2005 & 2008   Dr. Lorelle Formosa finger release  . PROSTATECTOMY  1999   Dr. Rosana Hoes   Social History   Socioeconomic History  . Marital status: Married    Spouse name: Not on file  . Number of children: 2  . Years of education: Not on file  .  Highest education level: Not on file  Occupational History  . Occupation: retired from [employer] Use  . Smoking status: Never Smoker  . Smokeless tobacco: Never Used  Vaping Use  . Vaping Use: Never used  Substance and Sexual Activity  . Alcohol use: No    Alcohol/week: 0.0 standard drinks  . Drug use: No  . Sexual activity: Yes  Other Topics Concern  . Not on file  Social History Narrative  . Not on file   Social Determinants of Health   Financial Resource Strain:   . Difficulty of Paying Living Expenses: Not on file  Food Insecurity:   . Worried About Charity fundraiser in the Last Year: Not on file  . Ran Out of Food in the Last Year: Not on file  Transportation Needs:   . Lack of Transportation (Medical): Not on file  . Lack of Transportation  (Non-Medical): Not on file  Physical Activity:   . Days of Exercise per Week: Not on file  . Minutes of Exercise per Session: Not on file  Stress:   . Feeling of Stress : Not on file  Social Connections:   . Frequency of Communication with Friends and Family: Not on file  . Frequency of Social Gatherings with Friends and Family: Not on file  . Attends Religious Services: Not on file  . Active Member of Clubs or Organizations: Not on file  . Attends Archivist Meetings: Not on file  . Marital Status: Not on file   Outpatient Encounter Medications as of 11/03/2019  Medication Sig  . aspirin EC 81 MG tablet Take 81 mg by mouth daily. Swallow whole.  . metoprolol tartrate (LOPRESSOR) 25 MG tablet Take 25 mg by mouth 2 (two) times daily.  Marland Kitchen allopurinol (ZYLOPRIM) 300 MG tablet TAKE 1 TABLET BY MOUTH ONCE DAILY  . amLODipine (NORVASC) 10 MG tablet TAKE 1 TABLET EVERY MORNING  . BD VEO INSULIN SYR ULTRAFINE 31G X 15/64" 1 ML MISC USE TWICE A DAY AS DIRECTED  . [facility] D3 5000 units capsule TAKE 1 CAPSULE BY MOUTH DAILY  . diphenhydrAMINE (BENADRYL) 25 MG tablet Take 25 mg by mouth daily as needed for allergies.  . fluticasone (CUTIVATE) 0.05 % cream Apply 1 application 2 (two) times a week topically. To face for itching and flaking  . fluticasone (FLONASE) 50 MCG/ACT nasal spray Place 2 sprays daily as needed into both nostrils for allergies or rhinitis.  Marland Kitchen gabapentin (NEURONTIN) 600 MG tablet Take 600-1,200 mg by mouth See admin instructions. Take 1200 mg in the morning and 600 mg at night  . glucose blood (ONETOUCH ULTRA) test strip USE TO TEST BLOOD SUGAR TWICE DAILY.  . hydrochlorothiazide (HYDRODIURIL) 25 MG tablet TAKE 1 TABLET BY MOUTH EVERY DAY  . insulin NPH-regular Human (NOVOLIN 70/30) (70-30) 100 UNIT/ML injection Inject 25 units with breakfast, 25 units with supper when premeal blood glucose readings above 90 mg per DL .  Marland Kitchen Lancets (ONETOUCH DELICA PLUS [id number]) MISC USE TO  TEST BLOOD SUGAR TWICE DAILY.  Marland Kitchen Propylene Glycol (SYSTANE BALANCE OP) Apply 1 drop as needed to eye (irritation).  . Rivaroxaban (XARELTO) 15 MG TABS tablet Take 15 mg by mouth daily.   . rosuvastatin (CRESTOR) 20 MG tablet TAKE 1 TABLET BY MOUTH EVERY DAY  . [DISCONTINUED] metoprolol (LOPRESSOR) 50 MG tablet TAKE 1 TABLET TWICE DAILY  . [DISCONTINUED] ONETOUCH ULTRA test strip USE TO TEST BLOOD SUGAR TWICE DAILY.  . [DISCONTINUED] simvastatin (  ZOCOR) 40 MG tablet Takes 1/2 tablet daily bedtime   No facility-administered encounter medications on file as of 11/03/2019.   ALLERGIES: Allergies  Allergen Reactions  . Ace Inhibitors Swelling    REACTION: angio edema (swelling of lips)  . Metformin And Related     Itching in higher doses    VACCINATION STATUS: Immunization History  Administered Date(s) Administered  . Influenza Split 11/27/2011, 12/30/2012, 01/01/2014, 03/13/2017  . Influenza Whole 01/27/2009, 11/30/2009, 11/17/2010  . Influenza, High Dose Seasonal PF 12/21/2015, 01/04/2018  . Pneumococcal Conjugate-13 02/04/2018  . Pneumococcal Polysaccharide-23 12/02/2008    Diabetes He presents for his follow-up diabetic visit. He has type 2 diabetes mellitus. Onset time: Diagnosed approximately  at age 71. His disease course has been stable. Pertinent negatives for hypoglycemia include no confusion, headaches, pallor or seizures. Pertinent negatives for diabetes include no chest pain, no fatigue, no polydipsia, no polyphagia, no polyuria and no weakness. Symptoms are stable. Diabetic complications include nephropathy. Risk factors for coronary artery disease include diabetes mellitus, dyslipidemia, male sex, hypertension and sedentary lifestyle. Current diabetic treatment includes insulin injections. His weight is decreasing steadily. He is following a generally unhealthy diet. Meal planning includes ADA exchanges. He participates in exercise intermittently. His home blood glucose trend is  fluctuating minimally. His breakfast blood glucose range is generally 140-180 mg/dl. His dinner blood glucose range is generally >200 mg/dl. His overall blood glucose range is 180-200 mg/dl. (He reports near target fasting glycemic profile, significantly above target postprandial readings.  He changed his insulin doses on his own.  No hypoglycemia.)  Hypertension This is a chronic problem. The current episode started more than 1 year ago. The problem is controlled. Pertinent negatives include no chest pain, headaches, neck pain, palpitations or shortness of breath. Past treatments include calcium channel blockers.  Hyperlipidemia This is a chronic problem. The current episode started more than 1 year ago. The problem is uncontrolled. Pertinent negatives include no chest pain, myalgias or shortness of breath. Current antihyperlipidemic treatment includes bile acid squestrants. Risk factors for coronary artery disease include dyslipidemia and diabetes mellitus.     Review of systems  Constitutional: + Minimally fluctuating body weight,  current  Body mass index is 28.79 kg/m. , no fatigue, no subjective hyperthermia, no subjective hypothermia Eyes: no blurry vision, no xerophthalmia ENT: no sore throat, no nodules palpated in throat, no dysphagia/odynophagia, no hoarseness Cardiovascular: no Chest Pain, no Shortness of Breath, no palpitations, no leg swelling Respiratory: no cough, no shortness of breath Gastrointestinal: no Nausea/Vomiting/Diarhhea Musculoskeletal: no muscle/joint aches Skin: no rashes, no hyperemia Neurological: no tremors, no numbness, no tingling, no dizziness Psychiatric: no depression, no anxiety   Objective:    Ht _0  (1.854 m)   BMI 28.79 kg/m   Wt Readings from Last 3 Encounters:  06/30/19 218 lb 3.2 oz (99 kg)  08/27/18 219 lb 9.6 oz (99.6 kg)  04/15/18 222 lb (100.7 kg)      Recent Results (from the past 2160 hour(s))  Comprehensive metabolic panel      Status: Abnormal   Collection Time: 10/29/19  3:02 PM  Result Value Ref Range   Glucose 120 (H) 65 - 99 mg/dL   BUN 22 8 - 27 mg/dL   Creatinine, Ser 2.02 (H) 0.76 - 1.27 mg/dL   GFR calc non Af Amer 31 (L) >59 mL/min/1.73   GFR calc Af Amer 35 (L) >59 mL/min/1.73    Comment: **[facility] currently reports eGFR in compliance with the current**  recommendations of the [employer]. [facility] will   update reporting as new guidelines are published from the NKF-ASN   Task force.    BUN/Creatinine Ratio 11 10 - 24   Sodium 144 134 - 144 mmol/L   Potassium 4.3 3.5 - 5.2 mmol/L   Chloride 101 96 - 106 mmol/L   CO2 31 (H) 20 - 29 mmol/L   Calcium 9.9 8.6 - 10.2 mg/dL   Total Protein 7.3 6.0 - 8.5 g/dL   Albumin 4.7 3.7 - 4.7 g/dL   Globulin, Total 2.6 1.5 - 4.5 g/dL   Albumin/Globulin Ratio 1.8 1.2 - 2.2   Bilirubin Total 0.5 0.0 - 1.2 mg/dL   Alkaline Phosphatase 84 48 - 121 IU/L   AST 23 0 - 40 IU/L   ALT 21 0 - 44 IU/L  Lipid panel     Status: None   Collection Time: 10/29/19  3:02 PM  Result Value Ref Range   Cholesterol, Total 155 100 - 199 mg/dL   Triglycerides 89 0 - 149 mg/dL   HDL 46 >39 mg/dL   VLDL Cholesterol Cal 17 5 - 40 mg/dL   LDL Chol Calc (NIH) 92 0 - 99 mg/dL   Chol/HDL Ratio 3.4 0.0 - 5.0 ratio    Comment:                                   T. Chol/HDL Ratio                                             Men  Women                               1/2 Avg.Risk  3.4    3.3                                   Avg.Risk  5.0    4.4                                2X Avg.Risk  9.6    7.1                                3X Avg.Risk 23.4   11.0   TSH     Status: None   Collection Time: 10/29/19  3:02 PM  Result Value Ref Range   TSH 1.530 0.450 - 4.500 uIU/mL  T4, free     Status: None   Collection Time: 10/29/19  3:02 PM  Result Value Ref Range   Free T4 0.93 0.82 - 1.77 ng/dL  VITAMIN D 25 Hydroxy (Vit-D Deficiency, Fractures)     Status: None   Collection  Time: 10/29/19  3:02 PM  Result Value Ref Range   Vit D, 25-Hydroxy 30.7 30.0 - 100.0 ng/mL    Comment: Vitamin D deficiency has been defined by the [place] and an Endocrine Society practice guideline as a level of serum 25-OH vitamin D less than 20 ng/mL (1,2). The Endocrine Society went on to further  define vitamin D insufficiency as a level between 21 and 29 ng/mL (2). 1. IOM (Institute of Medicine). 2010. Dietary reference    intakes for calcium and D. [place]: The    [employer]. 2. Holick MF, Binkley Hart, Bischoff-Ferrari HA, et al.    Evaluation, treatment, and prevention of vitamin D    deficiency: an Endocrine Society clinical practice    guideline. JCEM. 2011 Jul; 96(7):1911-30.   Microalbumin / creatinine urine ratio     Status: Abnormal   Collection Time: 10/29/19  3:02 PM  Result Value Ref Range   Creatinine, Urine 108.8 Not Estab. mg/dL   Microalbumin, Urine 365.6 Not Estab. ug/mL   Microalb/Creat Ratio 336 (H) 0 - 29 mg/g creat    Comment:                        Normal:                0 -  29                        Moderately increased: 30 - 300                        Severely increased:       >300   Hemoglobin A1c     Status: Abnormal   Collection Time: 10/29/19  3:02 PM  Result Value Ref Range   Hgb A1c MFr Bld 7.7 (H) 4.8 - 5.6 %    Comment:          Prediabetes: 5.7 - 6.4          Diabetes: >6.4          Glycemic control for adults with diabetes: <7.0    Est. average glucose Bld gHb Est-mCnc 174 mg/dL   Lipid Panel     Component Value Date/Time   CHOL 155 10/29/2019 1502   TRIG 89 10/29/2019 1502   HDL 46 10/29/2019 1502   CHOLHDL 3.4 10/29/2019 1502   CHOLHDL 4.8 11/20/2017 0745   VLDL 28 03/17/2016 0714   LDLCALC 92 10/29/2019 1502   LDLCALC 152 (H) 11/20/2017 0745   LDLDIRECT 147.5 06/10/2012 0930     Assessment & Plan:   1. Type 2 diabetes mellitus with stage 2 chronic kidney disease He reports near target  fasting glycemic profile, significantly above target postprandial readings.  He changed his insulin doses on his own.  No hypoglycemia.  His previsit labs show A1c of 7.7%.  Patient is advised to stick to the recommended insulin dose and a routine mealtimes to eat 3 meals  a day and avoid unnecessary snacks ( to snack only to correct hypoglycemia).  - he  admits there is a room for improvement in his diet and drink choices. -  Suggestion is made for him to avoid simple carbohydrates  from his diet including Cakes, Sweet Desserts / Pastries, Ice Cream, Soda (diet and regular), Sweet Tea, Candies, Chips, Cookies, Sweet Pastries,  Store Bought Juices, Alcohol in Excess of  1-2 drinks a day, Artificial Sweeteners, Coffee Creamer, and "Sugar-free" Products. This will help patient to have stable blood glucose profile and potentially avoid unintended weight gain.   -He worries about hypoglycemia, and he is at risk for hypoglycemia from inadvertent use of insulin.  -He is advised to continue Novolin 70/30 25 units with breakfast and 25 units with supper when pre-meal  blood glucose is above 90.  - He is advised to continue glucose monitoring at least 2 times daily-before breakfast and at supper and anytime as needed. - Patient is instructed to call back with extremes of blood glucose readings  less than 70 or greater than 300 mg/dl. -He is not a candidate for Metformin, SGLT2 inhibitors, nor incretin therapy.     2. Mixed hyperlipidemia - His repeat lipid panel showed significant improvement in his LDL at 92 from 166.  He is tolerating Crestor and advised to continue  Crestor 20 mg p.o. nightly.    - He is advised to continue with omega-3 fatty acids ,avoid butter and fried food and exercise regularly.  3. Essential hypertension  he is advised to home monitor blood pressure and report if > 140/90 on 2 separate readings.   He is currently on amlodipine 10 mg p.o. daily, metoprolol 50 mg daily.  I  discussed and added hydrochlorothiazide 25 mg p.o. daily at breakfast for better control of hypertension.    4) vitamin D deficiency -He is on ongoing supplement with vitamin D3  5000 units daily for the next 90 days.  He is advised to continue close follow-up with his PCP Dr. Grier Mitts.   - Time spent on this patient care encounter:  35 min, of which > 50% was spent in  counseling and the rest reviewing his blood glucose logs , discussing his hypoglycemia and hyperglycemia episodes, reviewing his current and  previous labs / studies  ( including abstraction from other facilities) and medications  doses and developing a  long term treatment plan and documenting his care.   Please refer to Patient Instructions for Blood Glucose Monitoring and Insulin/Medications Dosing Guide"  in media tab for additional information. Please  also refer to " Patient Self Inventory" in the Media  tab for reviewed elements of pertinent patient history.  Hector Rose participated in the discussions, expressed understanding, and voiced agreement with the above plans.  All questions were answered to his satisfaction. he is encouraged to contact clinic should he have any questions or concerns prior to his return visit.   Follow up plan: Return in about 4 months (around 03/04/2020) for F/U with Meter and Logs Only - no Labs.  Glade Lloyd, MD Phone: [phone]  Fax: [phone]  -  This note was partially dictated with voice recognition software. Similar sounding words can be transcribed inadequately or may not  be corrected upon review.  11/03/2019, 1:18 PM

## 2019-11-11 DIAGNOSIS — R040 Epistaxis: Secondary | ICD-10-CM | POA: Diagnosis not present

## 2019-11-11 DIAGNOSIS — Z6829 Body mass index (BMI) 29.0-29.9, adult: Secondary | ICD-10-CM | POA: Diagnosis not present

## 2019-11-11 DIAGNOSIS — R1310 Dysphagia, unspecified: Secondary | ICD-10-CM | POA: Diagnosis not present

## 2019-11-11 DIAGNOSIS — E663 Overweight: Secondary | ICD-10-CM | POA: Diagnosis not present

## 2019-12-04 DIAGNOSIS — Z23 Encounter for immunization: Secondary | ICD-10-CM | POA: Diagnosis not present

## 2019-12-12 DIAGNOSIS — R140 Abdominal distension (gaseous): Secondary | ICD-10-CM | POA: Diagnosis not present

## 2019-12-12 DIAGNOSIS — K5900 Constipation, unspecified: Secondary | ICD-10-CM | POA: Diagnosis not present

## 2019-12-12 DIAGNOSIS — Z6830 Body mass index (BMI) 30.0-30.9, adult: Secondary | ICD-10-CM | POA: Diagnosis not present

## 2019-12-12 DIAGNOSIS — R1904 Left lower quadrant abdominal swelling, mass and lump: Secondary | ICD-10-CM | POA: Diagnosis not present

## 2019-12-15 DIAGNOSIS — B029 Zoster without complications: Secondary | ICD-10-CM | POA: Diagnosis not present

## 2019-12-15 DIAGNOSIS — E1140 Type 2 diabetes mellitus with diabetic neuropathy, unspecified: Secondary | ICD-10-CM | POA: Diagnosis not present

## 2019-12-15 DIAGNOSIS — I82501 Chronic embolism and thrombosis of unspecified deep veins of right lower extremity: Secondary | ICD-10-CM | POA: Diagnosis not present

## 2019-12-15 DIAGNOSIS — Z6830 Body mass index (BMI) 30.0-30.9, adult: Secondary | ICD-10-CM | POA: Diagnosis not present

## 2019-12-16 DIAGNOSIS — Z85528 Personal history of other malignant neoplasm of kidney: Secondary | ICD-10-CM | POA: Diagnosis not present

## 2019-12-16 DIAGNOSIS — N281 Cyst of kidney, acquired: Secondary | ICD-10-CM | POA: Diagnosis not present

## 2019-12-16 DIAGNOSIS — K838 Other specified diseases of biliary tract: Secondary | ICD-10-CM | POA: Diagnosis not present

## 2019-12-16 DIAGNOSIS — Z905 Acquired absence of kidney: Secondary | ICD-10-CM | POA: Diagnosis not present

## 2019-12-16 DIAGNOSIS — R1900 Intra-abdominal and pelvic swelling, mass and lump, unspecified site: Secondary | ICD-10-CM | POA: Diagnosis not present

## 2019-12-30 DIAGNOSIS — E663 Overweight: Secondary | ICD-10-CM | POA: Diagnosis not present

## 2019-12-30 DIAGNOSIS — Z6829 Body mass index (BMI) 29.0-29.9, adult: Secondary | ICD-10-CM | POA: Diagnosis not present

## 2019-12-30 DIAGNOSIS — K838 Other specified diseases of biliary tract: Secondary | ICD-10-CM | POA: Diagnosis not present

## 2020-01-06 ENCOUNTER — Telehealth: Payer: Self-pay

## 2020-01-06 ENCOUNTER — Other Ambulatory Visit: Payer: Self-pay

## 2020-01-06 DIAGNOSIS — E1122 Type 2 diabetes mellitus with diabetic chronic kidney disease: Secondary | ICD-10-CM

## 2020-01-06 DIAGNOSIS — N1831 Chronic kidney disease, stage 3a: Secondary | ICD-10-CM

## 2020-01-06 MED ORDER — ONETOUCH ULTRA VI STRP: ORAL_STRIP | 5 refills | Status: DC

## 2020-01-06 NOTE — Telephone Encounter (Signed)
Patient is requesting refill on his glucose blood (ONETOUCH ULTRA) test strip be sent to [facility]

## 2020-01-06 NOTE — Telephone Encounter (Signed)
Done

## 2020-01-16 DIAGNOSIS — E119 Type 2 diabetes mellitus without complications: Secondary | ICD-10-CM | POA: Diagnosis not present

## 2020-01-16 LAB — HM DIABETES EYE EXAM

## 2020-02-10 ENCOUNTER — Other Ambulatory Visit: Payer: Self-pay

## 2020-02-10 ENCOUNTER — Encounter (HOSPITAL_COMMUNITY): Payer: Self-pay | Admitting: Emergency Medicine

## 2020-02-10 ENCOUNTER — Emergency Department (HOSPITAL_COMMUNITY): Payer: PPO

## 2020-02-10 ENCOUNTER — Emergency Department (HOSPITAL_COMMUNITY)
Admission: EM | Admit: 2020-02-10 | Discharge: 2020-02-10 | Disposition: A | Discharge status: Home / Self Care | Payer: PPO | Attending: Emergency Medicine | Admitting: Emergency Medicine

## 2020-02-10 DIAGNOSIS — K219 Gastro-esophageal reflux disease without esophagitis: Secondary | ICD-10-CM | POA: Diagnosis not present

## 2020-02-10 DIAGNOSIS — R0789 Other chest pain: Secondary | ICD-10-CM | POA: Diagnosis not present

## 2020-02-10 DIAGNOSIS — Z86718 Personal history of other venous thrombosis and embolism: Secondary | ICD-10-CM | POA: Diagnosis not present

## 2020-02-10 DIAGNOSIS — E663 Overweight: Secondary | ICD-10-CM | POA: Diagnosis not present

## 2020-02-10 DIAGNOSIS — E119 Type 2 diabetes mellitus without complications: Secondary | ICD-10-CM | POA: Diagnosis not present

## 2020-02-10 DIAGNOSIS — Z6829 Body mass index (BMI) 29.0-29.9, adult: Secondary | ICD-10-CM | POA: Diagnosis not present

## 2020-02-10 DIAGNOSIS — R079 Chest pain, unspecified: Secondary | ICD-10-CM

## 2020-02-10 DIAGNOSIS — I10 Essential (primary) hypertension: Secondary | ICD-10-CM | POA: Diagnosis not present

## 2020-02-10 DIAGNOSIS — Z7984 Long term (current) use of oral hypoglycemic drugs: Secondary | ICD-10-CM | POA: Insufficient documentation

## 2020-02-10 DIAGNOSIS — Z87891 Personal history of nicotine dependence: Secondary | ICD-10-CM | POA: Insufficient documentation

## 2020-02-10 LAB — TROPONIN I (HIGH SENSITIVITY): Troponin I (High Sensitivity): 9 ng/L (ref <18)

## 2020-02-10 LAB — CBC
HCT: 43.2 % (ref 39.0–52.0)
Hemoglobin: 14.6 g/dL (ref 13.0–17.0)
MCH: 31.7 pg (ref 26.0–34.0)
MCHC: 33.8 g/dL (ref 30.0–36.0)
MCV: 93.7 fL (ref 80.0–100.0)
Platelets: 152 10*3/uL (ref 150–400)
RBC: 4.61 MIL/uL (ref 4.22–5.81)
RDW: 12.6 % (ref 11.5–15.5)
WBC: 6.6 10*3/uL (ref 4.0–10.5)
nRBC: 0 % (ref 0.0–0.2)

## 2020-02-10 LAB — BASIC METABOLIC PANEL
Anion gap: 11 (ref 5–15)
BUN: 25 mg/dL — ABNORMAL HIGH (ref 8–23)
CO2: 32 mmol/L (ref 22–32)
Calcium: 9.3 mg/dL (ref 8.9–10.3)
Chloride: 99 mmol/L (ref 98–111)
Creatinine, Ser: 2.1 mg/dL — ABNORMAL HIGH (ref 0.61–1.24)
GFR, Estimated: 32 mL/min — ABNORMAL LOW (ref >60)
Glucose, Bld: 113 mg/dL — ABNORMAL HIGH (ref 70–99)
Potassium: 3.5 mmol/L (ref 3.5–5.1)
Sodium: 142 mmol/L (ref 135–145)

## 2020-02-10 MED ORDER — OMEPRAZOLE 20 MG PO CPDR: 20.0000 mg | DELAYED_RELEASE_CAPSULE | Freq: Every day | ORAL | 1 refills | Status: DC

## 2020-02-10 NOTE — ED Provider Notes (Signed)
[facility] Emergency Department Provider Note MRN:  [id number]  Arrival date & time: 02/10/20     Chief Complaint   Chest Pain   History of Present Illness   Hector Rose is a 79 y.o. year-old male with a history of diabetes, hypertension, DVT presenting to the ED with chief complaint of chest pain.  3 to 4 days of intermittent dull left-sided chest pain.  Nonradiating.  Mild in severity.  Improved with Pepto-Bismol yesterday.  Worsened with meal today.  Denies any associated symptoms.  No leg pain or swelling, no dizziness or diaphoresis, no nausea or vomiting, no trouble breathing.  No abdominal pain.  Review of Systems  A complete 10 system review of systems was obtained and all systems are negative except as noted in the HPI and PMH.   Patient's Health History    Past Medical History:  Diagnosis Date  . Asthma 04-11-11   AS CHILD ONLY  . BPH (benign prostatic hypertrophy) with urinary obstruction   . Carpal tunnel syndrome    patient denies at 05/14/17 preop visit   . Difficult intubation 04-11-11   08-11-97 some issues with intubation/record with chart  . DJD (degenerative joint disease)   . DM (diabetes mellitus) ([place]) 04-11-11   tyep II   . DVT femoral (deep venous thrombosis) with thrombophlebitis ([place]) 04-11-11   ?'09-tx. on Xarelton x 3-4 years   . GERD (gastroesophageal reflux disease) 04-11-11   mild, no med in 1 month  . Gout 04-11-11    ankle 2 yrs ago  . Hemorrhoids   . Hypercholesteremia   . Hypertension   . Internal hemorrhoids   . Keloid 04-11-11   multiple-arms,back, chest  . Pulmonary nodule 04-11-11   "PT. NOT AWARE" -denies problems with breathing  . Renal calculus   . Thyroid disease    hypothyroid    Past Surgical History:  Procedure Laterality Date  . cataract surgery  12/2013  . COLONOSCOPY WITH PROPOFOL N/A 02/05/2017   Procedure: COLONOSCOPY WITH PROPOFOL;  Surgeon: Ladene Artist, MD;  Location: [facility];  Service:  Endoscopy;  Laterality: N/A;  . COSMETIC SURGERY  2000   Keloid injections  . CYSTOSCOPY WITH BIOPSY  04/14/2011   Procedure: CYSTOSCOPY WITH BIOPSY;  Surgeon: Dutch Gray, MD;  Location: [facility];  Service: Urology;  Laterality: N/A;  Bladder Biopsies   . CYSTOSCOPY/RETROGRADE/URETEROSCOPY  04/14/2011   Procedure: CYSTOSCOPY/RETROGRADE/URETEROSCOPY;  Surgeon: Dutch Gray, MD;  Location: [facility];  Service: Urology;  Laterality: Bilateral;  (Bil) RPG   . LAPAROSCOPIC NEPHRECTOMY Right 05/21/2017   Procedure: LAPAROSCOPIC RADICAL NEPHRECTOMY;  Surgeon: Raynelle Bring, MD;  Location: [facility];  Service: Urology;  Laterality: Right;  . ortho surgery on fingers  2005 & 2008   Dr. Lorelle Formosa finger release  . PROSTATECTOMY  1999   Dr. Rosana Hoes    History reviewed. No pertinent family history.  Social History   Socioeconomic History  . Marital status: Married    Spouse name: Not on file  . Number of children: 2  . Years of education: Not on file  . Highest education level: Not on file  Occupational History  . Occupation: retired from [employer] Use  . Smoking status: Former Smoker    Types: Cigarettes  . Smokeless tobacco: Never Used  Vaping Use  . Vaping Use: Never used  Substance and Sexual Activity  . Alcohol use: No    Alcohol/week: 0.0 standard drinks  . Drug use: No  .  Sexual activity: Yes  Other Topics Concern  . Not on file  Social History Narrative  . Not on file   Social Determinants of Health   Financial Resource Strain:   . Difficulty of Paying Living Expenses: Not on file  Food Insecurity:   . Worried About Charity fundraiser in the Last Year: Not on file  . Ran Out of Food in the Last Year: Not on file  Transportation Needs:   . Lack of Transportation (Medical): Not on file  . Lack of Transportation (Non-Medical): Not on file  Physical Activity:   . Days of Exercise per Week: Not on file  . Minutes of Exercise per Session: Not on file  Stress:   . Feeling  of Stress : Not on file  Social Connections:   . Frequency of Communication with Friends and Family: Not on file  . Frequency of Social Gatherings with Friends and Family: Not on file  . Attends Religious Services: Not on file  . Active Member of Clubs or Organizations: Not on file  . Attends Archivist Meetings: Not on file  . Marital Status: Not on file  Intimate Partner Violence:   . Fear of Current or Ex-Partner: Not on file  . Emotionally Abused: Not on file  . Physically Abused: Not on file  . Sexually Abused: Not on file     Physical Exam   Vitals:   02/10/20 1705 02/10/20 1848  BP: (!) 148/75 (!) 190/77  Pulse: 88 67  Resp: 16 15  Temp: 98.1 F (36.7 C)   SpO2: 100% 98%    CONSTITUTIONAL: Well-appearing, NAD NEURO:  Alert and oriented x 3, no focal deficits EYES:  eyes equal and reactive ENT/NECK:  no LAD, no JVD CARDIO: Regular rate, well-perfused, normal S1 and S2 PULM:  CTAB no wheezing or rhonchi GI/GU:  normal bowel sounds, non-distended, non-tender MSK/SPINE:  No gross deformities, no edema SKIN:  no rash, atraumatic PSYCH:  Appropriate speech and behavior  *Additional and/or pertinent findings included in MDM below  Diagnostic and Interventional Summary    EKG Interpretation  Date/Time:  Tuesday February 10 2020 13:42:15 EST Ventricular Rate:  53 PR Interval:  204 QRS Duration: 82 QT Interval:  458 QTC Calculation: 429 R Axis:   23 Text Interpretation: Sinus bradycardia Otherwise normal ECG Confirmed by Gerlene Fee [phone]) on 02/10/2020 6:44:43 PM      Labs Reviewed  BASIC METABOLIC PANEL - Abnormal; Notable for the following components:      Result Value   Glucose, Bld 113 (*)    BUN 25 (*)    Creatinine, Ser 2.10 (*)    GFR, Estimated 32 (*)    All other components within normal limits  CBC  TROPONIN I (HIGH SENSITIVITY)  TROPONIN I (HIGH SENSITIVITY)    DG Chest 2 View  Final Result      Medications - No data to  display   Procedures  /  Critical Care Procedures  ED Course and Medical Decision Making  I have reviewed the triage vital signs, the nursing notes, and pertinent available records from the EMR.  Listed above are laboratory and imaging tests that I personally ordered, reviewed, and interpreted and then considered in my medical decision making (see below for details).  History is most consistent with GI etiology of chest pain.  Patient does have a few cardiovascular risk factors but EKG is reassuring, troponin is negative.  No evidence of DVT, fully compliant with  his anticoagulation, doubt PE.  Appropriate for discharge, advised follow-up with either PCP or cardiology to discuss outpatient stress testing.  Barth Kirks. Sedonia Small, [employer] [email or web address]  Final Clinical Impressions(s) / ED Diagnoses     ICD-10-CM   1. Chest pain, unspecified type  R07.9     ED Discharge Orders         Ordered    omeprazole (PRILOSEC) 20 MG capsule  Daily        02/10/20 1854           Discharge Instructions Discussed with and Provided to Patient:     Discharge Instructions     You were evaluated in the Emergency Department and after careful evaluation, we did not find any emergent condition requiring admission or further testing in the hospital.  Your exam/testing today was overall reassuring.  Your symptoms may be related to acid reflux related pain.  Please start taking the omeprazole medication daily.  We still feel that a closer look at your heart is necessary, and so please follow-up with your primary care doctor or cardiologist to discuss the need for outpatient stress testing  Please return to the Emergency Department if you experience any worsening of your condition.  Thank you for allowing [place] to be a part of your care.       Maudie Flakes, MD 02/10/20 Pauline Aus

## 2020-02-10 NOTE — ED Triage Notes (Signed)
Pt c/o intermittent non radiating chest pain since Saturday.

## 2020-02-10 NOTE — Discharge Instructions (Addendum)
You were evaluated in the Emergency Department and after careful evaluation, we did not find any emergent condition requiring admission or further testing in the hospital.  Your exam/testing today was overall reassuring.  Your symptoms may be related to acid reflux related pain.  Please start taking the omeprazole medication daily.  We still feel that a closer look at your heart is necessary, and so please follow-up with your primary care doctor or cardiologist to discuss the need for outpatient stress testing  Please return to the Emergency Department if you experience any worsening of your condition.  Thank you for allowing [place] to be a part of your care.

## 2020-02-19 ENCOUNTER — Encounter: Payer: Self-pay | Admitting: Internal Medicine

## 2020-02-19 ENCOUNTER — Ambulatory Visit: Payer: PPO | Admitting: Internal Medicine

## 2020-02-19 ENCOUNTER — Other Ambulatory Visit: Payer: Self-pay

## 2020-02-19 VITALS — BP 120/60 | HR 60 | Ht 73.0 in | Wt 214.0 lb

## 2020-02-19 DIAGNOSIS — R079 Chest pain, unspecified: Secondary | ICD-10-CM | POA: Diagnosis not present

## 2020-02-19 DIAGNOSIS — E119 Type 2 diabetes mellitus without complications: Secondary | ICD-10-CM

## 2020-02-19 DIAGNOSIS — I10 Essential (primary) hypertension: Secondary | ICD-10-CM | POA: Diagnosis not present

## 2020-02-19 DIAGNOSIS — E782 Mixed hyperlipidemia: Secondary | ICD-10-CM | POA: Diagnosis not present

## 2020-02-19 NOTE — Progress Notes (Signed)
OFFICE CONSULT NOTE  Chief Complaint:  Chest pain  Primary Care Physician: Sharilyn Sites, MD  HPI:  Hector Rose is a 79 y.o. male who is being seen today for the evaluation of chest pain at the request of Sharilyn Sites, MD.  This is a pleasant 79 year old male whose wife is a patient of mine.  He has a history of type 2 diabetes, femoral DVT, hypertension, dyslipidemia and thyroid disease who had presented recently with left-sided chest pain to the [facility] emergency department.  He ruled out for MI and his symptoms were thought due to reflux.  He had reported his symptoms are worse after eating and felt like a burning pain.  He was not getting the symptoms with exertion and denies improvement with rest.  He does get some shortness of breath with more marked exertion but is fairly active.  He says there is no significant heart disease in his family.  He denies any recurrent chest pain since his recent ER visit.  PMHx:  Past Medical History:  Diagnosis Date  . Asthma 04-11-11   AS CHILD ONLY  . BPH (benign prostatic hypertrophy) with urinary obstruction   . Carpal tunnel syndrome    patient denies at 05/14/17 preop visit   . Difficult intubation 04-11-11   08-11-97 some issues with intubation/record with chart  . DJD (degenerative joint disease)   . DM (diabetes mellitus) ([place]) 04-11-11   tyep II   . DVT femoral (deep venous thrombosis) with thrombophlebitis ([place]) 04-11-11   ?'09-tx. on Xarelton x 3-4 years   . GERD (gastroesophageal reflux disease) 04-11-11   mild, no med in 1 month  . Gout 04-11-11    ankle 2 yrs ago  . Hemorrhoids   . Hypercholesteremia   . Hypertension   . Internal hemorrhoids   . Keloid 04-11-11   multiple-arms,back, chest  . Pulmonary nodule 04-11-11   "PT. NOT AWARE" -denies problems with breathing  . Renal calculus   . Thyroid disease    hypothyroid    Past Surgical History:  Procedure Laterality Date  . cataract surgery  12/2013  . COLONOSCOPY WITH  PROPOFOL N/A 02/05/2017   Procedure: COLONOSCOPY WITH PROPOFOL;  Surgeon: Ladene Artist, MD;  [facility];  Service: Endoscopy;  Laterality: N/A;  . COSMETIC SURGERY  2000   Keloid injections  . CYSTOSCOPY WITH BIOPSY  04/14/2011   Procedure: CYSTOSCOPY WITH BIOPSY;  Surgeon: Dutch Gray, MD;  [facility];  Service: Urology;  Laterality: N/A;  Bladder Biopsies   . CYSTOSCOPY/RETROGRADE/URETEROSCOPY  04/14/2011   Procedure: CYSTOSCOPY/RETROGRADE/URETEROSCOPY;  Surgeon: Dutch Gray, MD;  [facility];  Service: Urology;  Laterality: Bilateral;  (Bil) RPG   . LAPAROSCOPIC NEPHRECTOMY Right 05/21/2017   Procedure: LAPAROSCOPIC RADICAL NEPHRECTOMY;  Surgeon: Raynelle Bring, MD;  [facility];  Service: Urology;  Laterality: Right;  . ortho surgery on fingers  2005 & 2008   Dr. Lorelle Formosa finger release  . PROSTATECTOMY  1999   Dr. Rosana Hoes    FAMHx:  Premature coronary disease in his family at an early age  SOCHx:   reports that he has quit smoking. His smoking use included cigarettes. He has never used smokeless tobacco. He reports that he does not drink alcohol and does not use drugs.  ALLERGIES:  Allergies  Allergen Reactions  . Ace Inhibitors Swelling    REACTION: angio edema (swelling of lips)  . Metformin And Related     Itching in higher doses  ROS: Pertinent items noted in HPI and remainder of comprehensive ROS otherwise negative.  HOME MEDS: Current Outpatient Medications on File Prior to Visit  Medication Sig Dispense Refill  . allopurinol (ZYLOPRIM) 300 MG tablet TAKE 1 TABLET BY MOUTH ONCE DAILY 90 tablet 0  . amLODipine (NORVASC) 10 MG tablet TAKE 1 TABLET EVERY MORNING 90 tablet 0  . aspirin EC 81 MG tablet Take 81 mg by mouth daily. Swallow whole.    . BD VEO INSULIN SYR ULTRAFINE 31G X 15/64" 1 ML MISC USE TWICE A DAY AS DIRECTED 100 each 5  . [facility] D3 5000 units capsule TAKE 1 CAPSULE BY MOUTH DAILY 100 capsule 0  . diphenhydrAMINE  (BENADRYL) 25 MG tablet Take 25 mg by mouth daily as needed for allergies.    Marland Kitchen donepezil (ARICEPT) 5 MG tablet donepezil 5 mg tablet  TAKE 1 TABLET BY MOUTH EVERYDAY AT BEDTIME    . fluticasone (CUTIVATE) 0.05 % cream Apply 1 application 2 (two) times a week topically. To face for itching and flaking  3  . fluticasone (FLONASE) 50 MCG/ACT nasal spray Place 2 sprays daily as needed into both nostrils for allergies or rhinitis.    Marland Kitchen gabapentin (NEURONTIN) 600 MG tablet Take 600-1,200 mg by mouth See admin instructions. Take 1200 mg in the morning and 600 mg at night    . glucose blood (ONETOUCH ULTRA) test strip USE TO TEST BLOOD SUGAR TWICE DAILY. 100 strip 5  . hydrochlorothiazide (HYDRODIURIL) 25 MG tablet TAKE 1 TABLET BY MOUTH EVERY DAY 90 tablet 0  . insulin NPH-regular Human (NOVOLIN 70/30) (70-30) 100 UNIT/ML injection Inject 25 units with breakfast, 25 units with supper when premeal blood glucose readings above 90 mg per DL . 30 mL 2  . Lancets (ONETOUCH DELICA PLUS [id number]) MISC USE TO TEST BLOOD SUGAR TWICE DAILY. 100 each 5  . metFORMIN (GLUCOPHAGE) 500 MG tablet Take by mouth.    . metoprolol tartrate (LOPRESSOR) 25 MG tablet Take 25 mg by mouth 2 (two) times daily.    Marland Kitchen omeprazole (PRILOSEC) 20 MG capsule Take 1 capsule (20 mg total) by mouth daily. 30 capsule 1  . Propylene Glycol (SYSTANE BALANCE OP) Apply 1 drop as needed to eye (irritation).    . Rivaroxaban (XARELTO) 15 MG TABS tablet Take 15 mg by mouth daily.     . rosuvastatin (CRESTOR) 20 MG tablet TAKE 1 TABLET BY MOUTH EVERY DAY 90 tablet 1  . [DISCONTINUED] simvastatin (ZOCOR) 40 MG tablet Takes 1/2 tablet daily bedtime     No current facility-administered medications on file prior to visit.    LABS/IMAGING: No results found for this or any previous visit (from the past 48 hour(s)). No results found.  LIPID PANEL:    Component Value Date/Time   CHOL 155 10/29/2019 1502   TRIG 89 10/29/2019 1502   HDL 46  10/29/2019 1502   CHOLHDL 3.4 10/29/2019 1502   CHOLHDL 4.8 11/20/2017 0745   VLDL 28 03/17/2016 0714   LDLCALC 92 10/29/2019 1502   LDLCALC 152 (H) 11/20/2017 0745   LDLDIRECT 147.5 06/10/2012 0930    WEIGHTS: Wt Readings from Last 3 Encounters:  02/19/20 214 lb (97.1 kg)  02/10/20 210 lb (95.3 kg)  06/30/19 218 lb 3.2 oz (99 kg)    VITALS: BP 120/60   Pulse 60   Ht 6\' 1"  (1.854 m)   Wt 214 lb (97.1 kg)   BMI 28.23 kg/m   EXAM: General appearance: alert and no  distress Neck: no carotid bruit, no JVD and thyroid not enlarged, symmetric, no tenderness/mass/nodules Lungs: clear to auscultation bilaterally Heart: regular rate and rhythm, S1, S2 normal, no murmur, click, rub or gallop Abdomen: soft, non-tender; bowel sounds normal; no masses,  no organomegaly Extremities: extremities normal, atraumatic, no cyanosis or edema Pulses: 2+ and symmetric Skin: Skin color, texture, turgor normal. No rashes or lesions Neurologic: Grossly normal Psych: Pleasant  EKG: ER EKG from 11/30 reviewed, demonstrates at 53-no ischemia, personally reviewed  ASSESSMENT: 1. Atypical chest pain, dyspnea 2. Type 2 diabetes 3. Hypertension 4. Dyslipidemia  PLAN: 1.   Mr. Luten is describing atypical chest pain but has had some dyspnea and was noted to be bradycardic in the ER.  He did rule out for MI but has multiple risk factors include type 2 diabetes, hypertension, dyslipidemia and an older age.  He is at least at intermediate risk for coronary disease and I agree with further testing for risk stratification.  He has not exercised a lot recently but says he feels like he could exercise and would recommend an exercise Myoview stress test.  If this is negative for ischemia then would pursue a possible GI etiology.  We will contact him with those results and if abnormal follow-up for further testing.  Thanks again for the kind referral.  Pixie Casino, MD, FACC, [place] Director of the [facility]  [facility] Diplomate of the American Board of Clinical Lipidology Attending Cardiologist  Direct Dial: [phone]  Fax: [phone]  Website:  [email or web address] 02/19/2020, 6:57 PM

## 2020-02-19 NOTE — Patient Instructions (Signed)
Medication Instructions:  Your physician recommends that you continue on your current medications as directed. Please refer to the Current Medication list given to you today.  *If you need a refill on your cardiac medications before your next appointment, please call your pharmacy*   Testing/Procedures: You will need to have the coronavirus test completed prior to your stress test. An appointment has been made at ________ on  _________ . This is a Drive Up Visit at the [address]. Please tell them that you are there for pre-procedure testing. Someone will direct you to the appropriate testing line. Stay in your car and someone will be with you shortly. Please make sure to have all other labs completed before this test because you will need to stay quarantined until your procedure. Please take your insurance card to this test.    Dr. Debara Pickett has ordered a Myocardial Perfusion Imaging Study. This is done at his office   The test will take approximately 3 to 4 hours to complete; you may bring reading material.  If someone comes with you to your appointment, they will need to remain in the main lobby due to limited space in the testing area. **If you are pregnant or breastfeeding, please notify the nuclear lab prior to your appointment**  You will need to hold the following medications prior to your stress test: beta-blockers (24 hours prior to test)   How to prepare for your Myocardial Perfusion Test:  Do not eat or drink 3 hours prior to your test, except you may have water.  Do not consume products containing caffeine (regular or decaffeinated) 12 hours prior to your test. (ex: coffee, chocolate, sodas, tea).  Do wear comfortable clothes (no dresses or overalls) and walking shoes, tennis shoes preferred (No heels or open toe shoes are allowed).  Do NOT wear cologne, perfume, aftershave, or lotions (deodorant is allowed).  If these instructions are not followed, your test  will have to be rescheduled.    Follow-Up: At [facility], you and your health needs are our priority.  As part of our continuing mission to provide you with exceptional heart care, we have created designated Provider Care Teams.  These Care Teams include your primary Cardiologist (physician) and Advanced Practice Providers (APPs -  Physician Assistants and Nurse Practitioners) who all work together to provide you with the care you need, when you need it.  We recommend signing up for the patient portal called "MyChart".  Sign up information is provided on this After Visit Summary.  MyChart is used to connect with patients for Virtual Visits (Telemedicine).  Patients are able to view lab/test results, encounter notes, upcoming appointments, etc.  Non-urgent messages can be sent to your provider as well.   To learn more about what you can do with MyChart, go to [email or web address].    Your next appointment:   AS NEEDED with Dr. Debara Pickett

## 2020-02-20 ENCOUNTER — Telehealth: Payer: Self-pay | Admitting: Internal Medicine

## 2020-02-20 NOTE — Telephone Encounter (Signed)
Spoke with patient concerning COVID prescreening required prior to exercise myoview scheduled 02/27/20---COVID scheduled Tuesday 02/24/20 at 10:35 am--[address].  Patient voiced his understanding.

## 2020-02-23 ENCOUNTER — Telehealth: Payer: Self-pay | Admitting: Internal Medicine

## 2020-02-23 ENCOUNTER — Other Ambulatory Visit: Payer: Self-pay | Admitting: "Endocrinology

## 2020-02-23 NOTE — Telephone Encounter (Signed)
Ok to do [employer] - no exercise.  Dr Lemmie Evens

## 2020-02-23 NOTE — Addendum Note (Signed)
Addended by: Fidel Levy on: 02/23/2020 10:45 AM   Modules accepted: Orders

## 2020-02-23 NOTE — Telephone Encounter (Signed)
Spoke to patient he stated he has bad knees and don't think he can walk far on upcoming stress myoview.Advised I will send message to Dr.Hilty to see if he is ok with changing to a lexiscan.

## 2020-02-23 NOTE — Telephone Encounter (Signed)
Patient states that he doesn't think his knee's are going to allow him to do the exercise stress test that is scheduled for 12/17. He wants to know if he can do the nuclear stress test.

## 2020-02-23 NOTE — Telephone Encounter (Signed)
Order modified. Notified scheduler to change stress test type

## 2020-02-24 ENCOUNTER — Other Ambulatory Visit (HOSPITAL_COMMUNITY): Payer: PPO

## 2020-02-24 ENCOUNTER — Other Ambulatory Visit (HOSPITAL_COMMUNITY)
Admission: RE | Admit: 2020-02-24 | Discharge: 2020-02-24 | Disposition: A | Discharge status: Home / Self Care | Payer: PPO | Source: Ambulatory Visit | Attending: Cardiovascular Disease | Admitting: Cardiovascular Disease

## 2020-02-24 DIAGNOSIS — Z01812 Encounter for preprocedural laboratory examination: Secondary | ICD-10-CM | POA: Diagnosis not present

## 2020-02-24 DIAGNOSIS — Z20822 Contact with and (suspected) exposure to covid-19: Secondary | ICD-10-CM | POA: Insufficient documentation

## 2020-02-24 LAB — SARS CORONAVIRUS 2 (TAT 6-24 HRS): SARS Coronavirus 2: NEGATIVE

## 2020-02-27 ENCOUNTER — Ambulatory Visit (HOSPITAL_COMMUNITY)
Admission: RE | Admit: 2020-02-27 | Payer: PPO | Source: Ambulatory Visit | Attending: Internal Medicine | Admitting: Internal Medicine

## 2020-02-27 ENCOUNTER — Telehealth (HOSPITAL_COMMUNITY): Payer: Self-pay | Admitting: *Deleted

## 2020-02-27 DIAGNOSIS — I82501 Chronic embolism and thrombosis of unspecified deep veins of right lower extremity: Secondary | ICD-10-CM | POA: Diagnosis not present

## 2020-02-27 DIAGNOSIS — Z6829 Body mass index (BMI) 29.0-29.9, adult: Secondary | ICD-10-CM | POA: Diagnosis not present

## 2020-02-27 DIAGNOSIS — J029 Acute pharyngitis, unspecified: Secondary | ICD-10-CM | POA: Diagnosis not present

## 2020-02-27 NOTE — Telephone Encounter (Signed)
Close encounter 

## 2020-03-02 ENCOUNTER — Ambulatory Visit (HOSPITAL_COMMUNITY)
Admission: RE | Admit: 2020-03-02 | Discharge: 2020-03-02 | Disposition: A | Discharge status: Home / Self Care | Payer: PPO | Source: Ambulatory Visit | Attending: Cardiovascular Disease | Admitting: Cardiovascular Disease

## 2020-03-02 ENCOUNTER — Other Ambulatory Visit: Payer: Self-pay

## 2020-03-02 DIAGNOSIS — R079 Chest pain, unspecified: Secondary | ICD-10-CM | POA: Insufficient documentation

## 2020-03-02 LAB — MYOCARDIAL PERFUSION IMAGING
LV dias vol: 144 mL (ref 62–150)
LV sys vol: 75 mL
Peak HR: 81 {beats}/min
Rest HR: 56 {beats}/min
SDS: 0
SRS: 3
SSS: 3
TID: 0.96

## 2020-03-02 MED ORDER — TECHNETIUM TC 99M TETROFOSMIN IV KIT
9.7000 | PACK | Freq: Once | INTRAVENOUS | Status: AC | PRN
  Administered 2020-03-02: 9.7 via INTRAVENOUS
  Filled 2020-03-02: qty 10

## 2020-03-02 MED ORDER — TECHNETIUM TC 99M TETROFOSMIN IV KIT
31.8000 | PACK | Freq: Once | INTRAVENOUS | Status: AC | PRN
  Administered 2020-03-02: 09:00:00 31.8 via INTRAVENOUS
  Filled 2020-03-02: qty 32

## 2020-03-02 MED ORDER — REGADENOSON 0.4 MG/5ML IV SOLN
0.4000 mg | Freq: Once | INTRAVENOUS | Status: AC
  Administered 2020-03-02: 0.4 mg via INTRAVENOUS

## 2020-03-04 ENCOUNTER — Encounter: Payer: Self-pay | Admitting: "Endocrinology

## 2020-03-04 ENCOUNTER — Telehealth (INDEPENDENT_AMBULATORY_CARE_PROVIDER_SITE_OTHER): Payer: PPO | Admitting: "Endocrinology

## 2020-03-04 VITALS — Ht 73.0 in | Wt 214.0 lb

## 2020-03-04 DIAGNOSIS — N1831 Chronic kidney disease, stage 3a: Secondary | ICD-10-CM

## 2020-03-04 DIAGNOSIS — I10 Essential (primary) hypertension: Secondary | ICD-10-CM | POA: Diagnosis not present

## 2020-03-04 DIAGNOSIS — E1122 Type 2 diabetes mellitus with diabetic chronic kidney disease: Secondary | ICD-10-CM

## 2020-03-04 DIAGNOSIS — E782 Mixed hyperlipidemia: Secondary | ICD-10-CM

## 2020-03-04 DIAGNOSIS — Z794 Long term (current) use of insulin: Secondary | ICD-10-CM

## 2020-03-04 NOTE — Progress Notes (Signed)
03/04/2020                                                     Endocrinology Telehealth Visit Follow up Note -During COVID -19 Pandemic  This visit type was conducted  via telephone due to national recommendations for restrictions regarding the COVID-19 Pandemic  in an effort to limit this patient's exposure and mitigate transmission of the corona virus.  Due to his co-morbid illnesses, Hector Rose is at  moderate to high risk for complications without adequate follow up.  This format is felt to be most appropriate for him at this time.  I connected with this patient on 03/04/2020   by telephone and verified that I am speaking with the correct person using two identifiers. Hector Rose, 28-Apr-1940. he has verbally consented to this visit. I was in my office and patient was in his residence. No other persons were with me during the encounter. All issues noted in this document were discussed and addressed. The format was not optimal for physical exam.    Subjective:    Patient ID: Hector Rose, male    DOB: 03/17/1940,    Past Medical History:  Diagnosis Date  . Asthma 04-11-11   AS CHILD ONLY  . BPH (benign prostatic hypertrophy) with urinary obstruction   . Carpal tunnel syndrome    patient denies at 05/14/17 preop visit   . Difficult intubation 04-11-11   08-11-97 some issues with intubation/record with chart  . DJD (degenerative joint disease)   . DM (diabetes mellitus) ([place]) 04-11-11   tyep II   . DVT femoral (deep venous thrombosis) with thrombophlebitis ([place]) 04-11-11   ?'09-tx. on Xarelton x 3-4 years   . GERD (gastroesophageal reflux disease) 04-11-11   mild, no med in 1 month  . Gout 04-11-11    ankle 2 yrs ago  . Hemorrhoids   . Hypercholesteremia   . Hypertension   . Internal hemorrhoids   . Keloid 04-11-11   multiple-arms,back, chest  . Pulmonary nodule 04-11-11   "PT. NOT AWARE" -denies problems with breathing  . Renal calculus   . Thyroid disease    hypothyroid   Past  Surgical History:  Procedure Laterality Date  . cataract surgery  12/2013  . COLONOSCOPY WITH PROPOFOL N/A 02/05/2017   Procedure: COLONOSCOPY WITH PROPOFOL;  Surgeon: Ladene Artist, MD;  Location: [facility];  Service: Endoscopy;  Laterality: N/A;  . COSMETIC SURGERY  2000   Keloid injections  . CYSTOSCOPY WITH BIOPSY  04/14/2011   Procedure: CYSTOSCOPY WITH BIOPSY;  Surgeon: Dutch Gray, MD;  Location: [facility];  Service: Urology;  Laterality: N/A;  Bladder Biopsies   . CYSTOSCOPY/RETROGRADE/URETEROSCOPY  04/14/2011   Procedure: CYSTOSCOPY/RETROGRADE/URETEROSCOPY;  Surgeon: Dutch Gray, MD;  Location: [facility];  Service: Urology;  Laterality: Bilateral;  (Bil) RPG   . LAPAROSCOPIC NEPHRECTOMY Right 05/21/2017   Procedure: LAPAROSCOPIC RADICAL NEPHRECTOMY;  Surgeon: Raynelle Bring, MD;  Location: [facility];  Service: Urology;  Laterality: Right;  . ortho surgery on fingers  2005 & 2008   Dr. Lorelle Formosa finger release  . PROSTATECTOMY  1999   Dr. Rosana Hoes   Social History   Socioeconomic History  . Marital status: Married    Spouse name: Not on file  . Number of children: 2  . Years of education: Not on file  .  Highest education level: Not on file  Occupational History  . Occupation: retired from [employer] Use  . Smoking status: Former Smoker    Types: Cigarettes  . Smokeless tobacco: Never Used  Vaping Use  . Vaping Use: Never used  Substance and Sexual Activity  . Alcohol use: No    Alcohol/week: 0.0 standard drinks  . Drug use: No  . Sexual activity: Yes  Other Topics Concern  . Not on file  Social History Narrative  . Not on file   Social Determinants of Health   Financial Resource Strain: Not on file  Food Insecurity: Not on file  Transportation Needs: Not on file  Physical Activity: Not on file  Stress: Not on file  Social Connections: Not on file   Outpatient Encounter Medications as of 03/04/2020  Medication Sig  . allopurinol (ZYLOPRIM) 300 MG tablet  TAKE 1 TABLET BY MOUTH ONCE DAILY  . amLODipine (NORVASC) 10 MG tablet TAKE 1 TABLET EVERY MORNING  . aspirin EC 81 MG tablet Take 81 mg by mouth daily. Swallow whole.  . BD VEO INSULIN SYR ULTRAFINE 31G X 15/64" 1 ML MISC USE TWICE A DAY AS DIRECTED  . [facility] D3 5000 units capsule TAKE 1 CAPSULE BY MOUTH DAILY (Patient not taking: Reported on 03/04/2020)  . diphenhydrAMINE (BENADRYL) 25 MG tablet Take 25 mg by mouth daily as needed for allergies.  Marland Kitchen donepezil (ARICEPT) 5 MG tablet donepezil 5 mg tablet  TAKE 1 TABLET BY MOUTH EVERYDAY AT BEDTIME  . fluticasone (CUTIVATE) 0.05 % cream Apply 1 application 2 (two) times a week topically. To face for itching and flaking (Patient not taking: Reported on 03/04/2020)  . fluticasone (FLONASE) 50 MCG/ACT nasal spray Place 2 sprays daily as needed into both nostrils for allergies or rhinitis.  Marland Kitchen gabapentin (NEURONTIN) 600 MG tablet Take 600-1,200 mg by mouth See admin instructions. Take 1200 mg in the morning and 600 mg at night  . glucose blood (ONETOUCH ULTRA) test strip USE TO TEST BLOOD SUGAR TWICE DAILY.  . hydrochlorothiazide (HYDRODIURIL) 25 MG tablet TAKE 1 TABLET BY MOUTH EVERY DAY  . insulin NPH-regular Human (NOVOLIN 70/30) (70-30) 100 UNIT/ML injection Inject 25 units with breakfast, 25 units with supper when premeal blood glucose readings above 90 mg per DL .  Marland Kitchen Lancets (ONETOUCH DELICA PLUS [id number]) MISC USE TO TEST BLOOD SUGAR TWICE DAILY.  . metoprolol tartrate (LOPRESSOR) 25 MG tablet Take 25 mg by mouth 2 (two) times daily.  Marland Kitchen omeprazole (PRILOSEC) 20 MG capsule Take 1 capsule (20 mg total) by mouth daily.  Marland Kitchen Propylene Glycol (SYSTANE BALANCE OP) Apply 1 drop as needed to eye (irritation).  . Rivaroxaban (XARELTO) 15 MG TABS tablet Take 15 mg by mouth daily.   . rosuvastatin (CRESTOR) 20 MG tablet TAKE 1 TABLET BY MOUTH EVERY DAY  . [DISCONTINUED] metFORMIN (GLUCOPHAGE) 500 MG tablet Take by mouth.  . [DISCONTINUED] simvastatin (ZOCOR)  40 MG tablet Takes 1/2 tablet daily bedtime   No facility-administered encounter medications on file as of 03/04/2020.   ALLERGIES: Allergies  Allergen Reactions  . Ace Inhibitors Swelling    REACTION: angio edema (swelling of lips)  . Metformin And Related     Itching in higher doses    VACCINATION STATUS: Immunization History  Administered Date(s) Administered  . Influenza Split 11/27/2011, 12/30/2012, 01/01/2014, 03/13/2017  . Influenza Whole 01/27/2009, 11/30/2009, 11/17/2010  . Influenza, High Dose Seasonal PF 12/21/2015, 01/04/2018  . Pneumococcal Conjugate-13 02/04/2018  . Pneumococcal  Polysaccharide-23 12/02/2008    Diabetes He presents for his follow-up diabetic visit. He has type 2 diabetes mellitus. Onset time: Diagnosed approximately  at age 80. His disease course has been improving. Pertinent negatives for hypoglycemia include no confusion, headaches, pallor or seizures. Pertinent negatives for diabetes include no chest pain, no fatigue, no polydipsia, no polyphagia, no polyuria and no weakness. Symptoms are improving. Diabetic complications include nephropathy. Risk factors for coronary artery disease include diabetes mellitus, dyslipidemia, male sex, hypertension and sedentary lifestyle. Current diabetic treatment includes insulin injections. His weight is fluctuating minimally. He is following a generally unhealthy diet. Meal planning includes ADA exchanges. He participates in exercise intermittently. His home blood glucose trend is decreasing steadily. His breakfast blood glucose range is generally 130-140 mg/dl. His lunch blood glucose range is generally 140-180 mg/dl. His overall blood glucose range is 140-180 mg/dl. (He reports fasting blood glucose readings between 93-147, presupper blood glucose readings between 129-196. He denies hypoglycemia. His most recent A1c was 7.7%.  ) An ACE inhibitor/angiotensin II receptor blocker is contraindicated.  Hypertension This is a  chronic problem. The current episode started more than 1 year ago. The problem is controlled. Pertinent negatives include no chest pain, headaches, neck pain, palpitations or shortness of breath. Past treatments include calcium channel blockers.  Hyperlipidemia This is a chronic problem. The current episode started more than 1 year ago. The problem is uncontrolled. Pertinent negatives include no chest pain, myalgias or shortness of breath. Current antihyperlipidemic treatment includes bile acid squestrants. Risk factors for coronary artery disease include dyslipidemia and diabetes mellitus.     Review of systems Limited as above.   Objective:    Ht 6\' 1"  (1.854 m)   Wt 214 lb (97.1 kg)   BMI 28.23 kg/m   Wt Readings from Last 3 Encounters:  03/04/20 214 lb (97.1 kg)  03/02/20 214 lb (97.1 kg)  02/19/20 214 lb (97.1 kg)      Recent Results (from the past 2160 hour(s))  [facility] DIABETES EYE EXAM     Status: None   Collection Time: 01/16/20  5:35 PM  Result Value Ref Range   [facility] Diabetic Eye Exam    Basic metabolic panel     Status: Abnormal   Collection Time: 02/10/20  2:14 PM  Result Value Ref Range   Sodium 142 135 - 145 mmol/L   Potassium 3.5 3.5 - 5.1 mmol/L   Chloride 99 98 - 111 mmol/L   CO2 32 22 - 32 mmol/L   Glucose, Bld 113 (H) 70 - 99 mg/dL    Comment: Glucose reference range applies only to samples taken after fasting for at least 8 hours.   BUN 25 (H) 8 - 23 mg/dL   Creatinine, Ser 2.10 (H) 0.61 - 1.24 mg/dL   Calcium 9.3 8.9 - 10.3 mg/dL   GFR, Estimated 32 (L) >60 mL/min    Comment: (NOTE) Calculated using the CKD-EPI Creatinine Equation (2021)    Anion gap 11 5 - 15    Comment: Performed at [facility], [address] [place], [place] [address]  CBC     Status: None   Collection Time: 02/10/20  2:14 PM  Result Value Ref Range   WBC 6.6 4.0 - 10.5 K/uL   RBC 4.61 4.22 - 5.81 MIL/uL   Hemoglobin 14.6 13.0 - 17.0 g/dL   HCT 43.2 39.0 - 52.0 %   MCV 93.7 80.0  - 100.0 fL   MCH 31.7 26.0 - 34.0 pg  MCHC 33.8 30.0 - 36.0 g/dL   RDW 12.6 11.5 - 15.5 %   Platelets 152 150 - 400 K/uL   nRBC 0.0 0.0 - 0.2 %    Comment: Performed at [facility], [address] [place], [place] [address]  Troponin I (High Sensitivity)     Status: None   Collection Time: 02/10/20  2:14 PM  Result Value Ref Range   Troponin I (High Sensitivity) 9 <18 ng/L    Comment: (NOTE) Elevated high sensitivity troponin I (hsTnI) values and significant  changes across serial measurements may suggest ACS but many other  chronic and acute conditions are known to elevate hsTnI results.  Refer to the "Links" section for chest pain algorithms and additional  guidance. Performed at [facility], [address], [address], [place] [address]   SARS CORONAVIRUS 2 (TAT 6-24 HRS) Nasopharyngeal Nasopharyngeal Swab     Status: None   Collection Time: 02/24/20  1:55 PM   Specimen: Nasopharyngeal Swab  Result Value Ref Range   SARS Coronavirus 2 NEGATIVE NEGATIVE    Comment: (NOTE) SARS-CoV-2 target nucleic acids are NOT DETECTED.  The SARS-CoV-2 RNA is generally detectable in upper and lower respiratory specimens during the acute phase of infection. Negative results do not preclude SARS-CoV-2 infection, do not rule out co-infections with other pathogens, and should not be used as the sole basis for treatment or other patient management decisions. Negative results must be combined with clinical observations, patient history, and epidemiological information. The expected result is Negative.  Fact Sheet for Patients: SugarRoll.be  Fact Sheet for Healthcare Providers: https://www.woods-mathews.com/  This test is not yet approved or cleared by the [place] FDA and  has been authorized for detection and/or diagnosis of SARS-CoV-2 by FDA under an Emergency Use Authorization (EUA). This EUA will remain  in effect (meaning this test can be  used) for the duration of the COVID-19 declaration under Se ction 564(b)(1) of the Act, 21 U.S.C. section 360bbb-3(b)(1), unless the authorization is terminated or revoked sooner.  Performed at [facility], [place] [address] [place], [place] [address]   MYOCARDIAL PERFUSION IMAGING     Status: None   Collection Time: 03/02/20 11:18 AM  Result Value Ref Range   Rest HR 56 bpm   Rest BP 142/80 mmHg   Peak HR 81 bpm   Peak BP 162/74 mmHg   SSS 3    SRS 3    SDS 0    TID 0.96    LV sys vol 75 mL   LV dias vol 144 62 - 150 mL   Lipid Panel     Component Value Date/Time   CHOL 155 10/29/2019 1502   TRIG 89 10/29/2019 1502   HDL 46 10/29/2019 1502   CHOLHDL 3.4 10/29/2019 1502   CHOLHDL 4.8 11/20/2017 0745   VLDL 28 03/17/2016 0714   LDLCALC 92 10/29/2019 1502   LDLCALC 152 (H) 11/20/2017 0745   LDLDIRECT 147.5 06/10/2012 0930     Assessment & Plan:   1. Type 2 diabetes mellitus with stage 2 chronic kidney disease   He reports fasting blood glucose readings between 93-147, presupper blood glucose readings between 129-196. He denies hypoglycemia. His most recent A1c was 7.7%.    Patient is advised to stick to the recommended insulin dose and a routine mealtimes to eat 3 meals  a day and avoid unnecessary snacks ( to snack only to correct hypoglycemia).  - he acknowledges that there is a room  for improvement in his food and drink choices. - Suggestion is made for him to avoid simple carbohydrates  from his diet including Cakes, Sweet Desserts, Ice Cream, Soda (diet and regular), Sweet Tea, Candies, Chips, Cookies, Store Bought Juices, Alcohol in Excess of  1-2 drinks a day, Artificial Sweeteners,  Coffee Creamer, and "Sugar-free" Products, Lemonade. This will help patient to have more stable blood glucose profile and potentially avoid unintended weight gain.  -He worries about hypoglycemia, and he is at risk for hypoglycemia from inadvertent use of insulin.  -He is  advised to continue Novolin 70/30 25 units with breakfast and 25 units with supper only when pre-meal blood glucose is above 90.  - He is advised to continue glucose monitoring at least 2 times daily-before breakfast and at supper and anytime as needed. - Patient is instructed to call back with extremes of blood glucose readings  less than 70 or greater than 300 mg/dl. -He is not a candidate for Metformin, SGLT2 inhibitors, nor incretin therapy.     2. Mixed hyperlipidemia - His repeat lipid panel showed significant improvement in his LDL at 92 from 166. He is tolerating Crestor, advised to continue Crestor 20 mg p.o. nightly.   - He is advised to continue with omega-3 fatty acids ,avoid butter and fried food and exercise regularly.  3. Essential hypertension  he is advised to home monitor blood pressure and report if > 140/90 on 2 separate readings.   He is currently on amlodipine 10 mg p.o. daily, metoprolol 50 mg daily.  I discussed and added hydrochlorothiazide 25 mg p.o. daily at breakfast for better control of hypertension.    4) vitamin D deficiency -He is on ongoing supplement with vitamin D3  5000 units daily for the next 90 days.  He is advised to continue close follow-up with his PCP Dr. Grier Mitts.   - Time spent on this patient care encounter:  35 min, of which > 50% was spent in  counseling and the rest reviewing his blood glucose logs , discussing his hypoglycemia and hyperglycemia episodes, reviewing his current and  previous labs / studies  ( including abstraction from other facilities) and medications  doses and developing a  long term treatment plan and documenting his care.   Please refer to Patient Instructions for Blood Glucose Monitoring and Insulin/Medications Dosing Guide"  in media tab for additional information. Please  also refer to " Patient Self Inventory" in the Media  tab for reviewed elements of pertinent patient history.  Hector Rose participated in  the discussions, expressed understanding, and voiced agreement with the above plans.  All questions were answered to his satisfaction. he is encouraged to contact clinic should he have any questions or concerns prior to his return visit.    Follow up plan: Return in about 3 months (around 06/02/2020) for Bring Meter and Logs- A1c in Office.  Glade Lloyd, MD Phone: [phone]  Fax: [phone]  -  This note was partially dictated with voice recognition software. Similar sounding words can be transcribed inadequately or may not  be corrected upon review.  03/04/2020, 12:30 PM

## 2020-03-04 NOTE — Patient Instructions (Signed)
                                     Advice for Weight Management  -For most of us the best way to lose weight is by diet management. Generally speaking, diet management means consuming less calories intentionally which over time brings about progressive weight loss.  This can be achieved more effectively by restricting carbohydrate consumption to the minimum possible.  So, it is critically important to know your numbers: how much calorie you are consuming and how much calorie you need. More importantly, our carbohydrates sources should be unprocessed or minimally processed complex starch food items.   Sometimes, it is important to balance nutrition by increasing protein intake (animal or plant source), fruits, and vegetables.  -Sticking to a routine mealtime to eat 3 meals a day and avoiding unnecessary snacks is shown to have a big role in weight control. Under normal circumstances, the only time we lose real weight is when we are hungry, so allow hunger to take place- hunger means no food between meal times, only water.  It is not advisable to starve.   -It is better to avoid simple carbohydrates including: Cakes, Sweet Desserts, Ice Cream, Soda (diet and regular), Sweet Tea, Candies, Chips, Cookies, Store Bought Juices, Alcohol in Excess of  1-2 drinks a day, Lemonade,  Artificial Sweeteners, Doughnuts, Coffee Creamers, "Sugar-free" Products, etc, etc.  This is not a complete list.....    -Consulting with certified diabetes educators is proven to provide you with the most accurate and current information on diet.  Also, you may be  interested in discussing diet options/exchanges , we can schedule a visit with Hector Rose, RDN, CDE for individualized nutrition education.  -Exercise: If you are able: 30 -60 minutes a day ,4 days a week, or 150 minutes a week.  The longer the better.  Combine stretch, strength, and aerobic activities.  If you were told in the  past that you have high risk for cardiovascular diseases, you may seek evaluation by your heart doctor prior to initiating moderate to intense exercise programs.                                  Additional Care Considerations for Diabetes   -Diabetes  is a chronic disease.  The most important care consideration is regular follow-up with your diabetes care provider with the goal being avoiding or delaying its complications and to take advantage of advances in medications and technology.    -Type 2 diabetes is known to coexist with other important comorbidities such as high blood pressure and high cholesterol.  It is critical to control not only the diabetes but also the high blood pressure and high cholesterol to minimize and delay the risk of complications including coronary artery disease, stroke, amputations, blindness, etc.    - Studies showed that people with diabetes will benefit from a class of medications known as ACE inhibitors and statins.  Unless there are specific reasons not to be on these medications, the standard of care is to consider getting one from these groups of medications at an optimal doses.  These medications are generally considered safe and proven to help protect the heart and the kidneys.    - People with diabetes are encouraged to initiate and maintain regular follow-up with eye doctors, foot   doctors, dentists , and if necessary heart and kidney doctors.     - It is highly recommended that people with diabetes quit smoking or stay away from smoking, and get yearly  flu vaccine and pneumonia vaccine at least every 5 years.  One other important lifestyle recommendation is to ensure adequate sleep - at least 6-7 hours of uninterrupted sleep at night.  -Exercise: If you are able: 30 -60 minutes a day, 4 days a week, or 150 minutes a week.  The longer the better.  Combine stretch, strength, and aerobic activities.  If you were told in the past that you have high risk for  cardiovascular diseases, you may seek evaluation by your heart doctor prior to initiating moderate to intense exercise programs.          

## 2020-03-10 ENCOUNTER — Telehealth: Payer: Self-pay | Admitting: Internal Medicine

## 2020-03-10 NOTE — Telephone Encounter (Signed)
Left message to call back for results:   Pixie Casino, MD  03/04/2020 8:11 PM EST      Low risk stress test, no ischemia.   Dr Lemmie Evens

## 2020-03-10 NOTE — Telephone Encounter (Signed)
   Pt is returning call to get stress test result  

## 2020-03-10 NOTE — Telephone Encounter (Signed)
Spoke with patient about test results. Voiced understanding

## 2020-03-10 NOTE — Telephone Encounter (Signed)
Patient is returning call.  °

## 2020-03-20 ENCOUNTER — Other Ambulatory Visit: Payer: Self-pay | Admitting: "Endocrinology

## 2020-04-07 ENCOUNTER — Other Ambulatory Visit: Payer: Self-pay | Admitting: Specialist

## 2020-04-07 ENCOUNTER — Ambulatory Visit
Admission: RE | Admit: 2020-04-07 | Discharge: 2020-04-07 | Disposition: A | Discharge status: Home / Self Care | Payer: PPO | Source: Ambulatory Visit | Attending: Specialist | Admitting: Specialist

## 2020-04-07 DIAGNOSIS — J929 Pleural plaque without asbestos: Secondary | ICD-10-CM | POA: Diagnosis not present

## 2020-04-07 DIAGNOSIS — Z043 Encounter for examination and observation following other accident: Secondary | ICD-10-CM | POA: Diagnosis not present

## 2020-04-07 DIAGNOSIS — M542 Cervicalgia: Secondary | ICD-10-CM | POA: Diagnosis not present

## 2020-04-07 DIAGNOSIS — M546 Pain in thoracic spine: Secondary | ICD-10-CM

## 2020-04-07 DIAGNOSIS — M25551 Pain in right hip: Secondary | ICD-10-CM | POA: Diagnosis not present

## 2020-04-07 DIAGNOSIS — M5134 Other intervertebral disc degeneration, thoracic region: Secondary | ICD-10-CM | POA: Diagnosis not present

## 2020-04-07 DIAGNOSIS — Z85528 Personal history of other malignant neoplasm of kidney: Secondary | ICD-10-CM | POA: Diagnosis not present

## 2020-04-07 DIAGNOSIS — M2578 Osteophyte, vertebrae: Secondary | ICD-10-CM | POA: Diagnosis not present

## 2020-04-07 DIAGNOSIS — M5136 Other intervertebral disc degeneration, lumbar region: Secondary | ICD-10-CM | POA: Diagnosis not present

## 2020-04-08 ENCOUNTER — Other Ambulatory Visit: Payer: Self-pay | Admitting: "Endocrinology

## 2020-04-08 DIAGNOSIS — M542 Cervicalgia: Secondary | ICD-10-CM | POA: Diagnosis not present

## 2020-04-08 DIAGNOSIS — M546 Pain in thoracic spine: Secondary | ICD-10-CM | POA: Diagnosis not present

## 2020-04-12 DIAGNOSIS — Z1331 Encounter for screening for depression: Secondary | ICD-10-CM | POA: Diagnosis not present

## 2020-04-12 DIAGNOSIS — E663 Overweight: Secondary | ICD-10-CM | POA: Diagnosis not present

## 2020-04-12 DIAGNOSIS — Z1389 Encounter for screening for other disorder: Secondary | ICD-10-CM | POA: Diagnosis not present

## 2020-04-12 DIAGNOSIS — Z6829 Body mass index (BMI) 29.0-29.9, adult: Secondary | ICD-10-CM | POA: Diagnosis not present

## 2020-04-12 DIAGNOSIS — E041 Nontoxic single thyroid nodule: Secondary | ICD-10-CM | POA: Diagnosis not present

## 2020-04-20 DIAGNOSIS — M542 Cervicalgia: Secondary | ICD-10-CM | POA: Diagnosis not present

## 2020-04-20 DIAGNOSIS — M4810 Ankylosing hyperostosis [Forestier], site unspecified: Secondary | ICD-10-CM | POA: Diagnosis not present

## 2020-04-23 DIAGNOSIS — E042 Nontoxic multinodular goiter: Secondary | ICD-10-CM | POA: Diagnosis not present

## 2020-04-26 DIAGNOSIS — K2289 Other specified disease of esophagus: Secondary | ICD-10-CM | POA: Diagnosis not present

## 2020-04-26 DIAGNOSIS — M5080 Other cervical disc disorders, unspecified cervical region: Secondary | ICD-10-CM | POA: Diagnosis not present

## 2020-04-26 DIAGNOSIS — J3489 Other specified disorders of nose and nasal sinuses: Secondary | ICD-10-CM | POA: Diagnosis not present

## 2020-04-26 DIAGNOSIS — K219 Gastro-esophageal reflux disease without esophagitis: Secondary | ICD-10-CM | POA: Diagnosis not present

## 2020-04-28 ENCOUNTER — Other Ambulatory Visit: Payer: Self-pay | Admitting: "Endocrinology

## 2020-04-28 ENCOUNTER — Telehealth: Payer: Self-pay

## 2020-04-28 NOTE — Telephone Encounter (Signed)
I found it under his CT.

## 2020-04-28 NOTE — Telephone Encounter (Signed)
This patient is already established here for DM-I Received a note on him that he needs to be seen for a nodule. Do you see anywhere in the chart that shows this?

## 2020-04-28 NOTE — Telephone Encounter (Signed)
I don't see anything to do with the thyroid

## 2020-04-29 ENCOUNTER — Ambulatory Visit: Payer: PPO | Admitting: "Endocrinology

## 2020-04-29 ENCOUNTER — Other Ambulatory Visit: Payer: Self-pay

## 2020-04-29 ENCOUNTER — Encounter: Payer: Self-pay | Admitting: "Endocrinology

## 2020-04-29 VITALS — BP 104/62 | HR 68 | Ht 73.0 in | Wt 212.6 lb

## 2020-04-29 DIAGNOSIS — E042 Nontoxic multinodular goiter: Secondary | ICD-10-CM | POA: Insufficient documentation
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
## 2020-05-11 DIAGNOSIS — Z86718 Personal history of other venous thrombosis and embolism: Secondary | ICD-10-CM | POA: Diagnosis not present

## 2020-05-11 DIAGNOSIS — Z7901 Long term (current) use of anticoagulants: Secondary | ICD-10-CM | POA: Diagnosis not present

## 2020-05-11 DIAGNOSIS — R1310 Dysphagia, unspecified: Secondary | ICD-10-CM | POA: Diagnosis not present

## 2020-05-21 ENCOUNTER — Ambulatory Visit: Payer: PPO | Admitting: Cardiology

## 2020-06-03 ENCOUNTER — Encounter: Payer: Self-pay | Admitting: "Endocrinology

## 2020-06-03 ENCOUNTER — Other Ambulatory Visit: Payer: Self-pay

## 2020-06-03 ENCOUNTER — Ambulatory Visit: Payer: PPO | Admitting: "Endocrinology

## 2020-06-03 VITALS — BP 138/75 | HR 55 | Ht 73.0 in | Wt 214.0 lb

## 2020-06-03 DIAGNOSIS — E782 Mixed hyperlipidemia: Secondary | ICD-10-CM

## 2020-06-03 DIAGNOSIS — E119 Type 2 diabetes mellitus without complications: Secondary | ICD-10-CM | POA: Diagnosis not present

## 2020-06-03 DIAGNOSIS — E559 Vitamin D deficiency, unspecified: Secondary | ICD-10-CM

## 2020-06-03 DIAGNOSIS — I10 Essential (primary) hypertension: Secondary | ICD-10-CM | POA: Diagnosis not present

## 2020-06-03 DIAGNOSIS — E1122 Type 2 diabetes mellitus with diabetic chronic kidney disease: Secondary | ICD-10-CM

## 2020-06-03 DIAGNOSIS — N1831 Chronic kidney disease, stage 3a: Secondary | ICD-10-CM | POA: Diagnosis not present

## 2020-06-03 DIAGNOSIS — E042 Nontoxic multinodular goiter: Secondary | ICD-10-CM

## 2020-06-03 DIAGNOSIS — Z794 Long term (current) use of insulin: Secondary | ICD-10-CM

## 2020-06-03 LAB — POCT GLYCOSYLATED HEMOGLOBIN (HGB A1C): HbA1c, POC (controlled diabetic range): 7 % (ref 0.0–7.0)

## 2020-06-03 MED ORDER — ACCU-CHEK GUIDE VI STRP: ORAL_STRIP | 2 refills | Status: DC

## 2020-06-03 MED ORDER — ACCU-CHEK GUIDE ME W/DEVICE KIT: 1.0000 | PACK | 0 refills | Status: AC

## 2020-06-03 MED ORDER — ONETOUCH DELICA PLUS LANCET33G MISC
2 refills | Status: DC
Start: 2020-06-03 — End: 2023-12-06

## 2020-06-03 MED ORDER — BD VEO INSULIN SYRINGE U/F 31G X 15/64" 1 ML MISC
2 refills | Status: DC
Start: 2020-06-03 — End: 2023-12-06

## 2020-06-03 NOTE — Progress Notes (Signed)
06/03/2020  Endocrinology follow-up note  Subjective:    Patient ID: Hector Rose, male    DOB: [birth year],    Past Medical History:  Diagnosis Date  . Asthma 04-11-11   AS CHILD ONLY  . BPH (benign prostatic hypertrophy) with urinary obstruction   . Carpal tunnel syndrome    patient denies at 05/14/17 preop visit   . Difficult intubation 04-11-11   08-11-97 some issues with intubation/record with chart  . DJD (degenerative joint disease)   . DM (diabetes mellitus) ([place]) 04-11-11   tyep II   . DVT femoral (deep venous thrombosis) with thrombophlebitis ([place]) 04-11-11   ?'09-tx. on Xarelton x 3-4 years   . GERD (gastroesophageal reflux disease) 04-11-11   mild, no med in 1 month  . Gout 04-11-11    ankle 2 yrs ago  . Hemorrhoids   . Hypercholesteremia   . Hypertension   . Internal hemorrhoids   . Keloid 04-11-11   multiple-arms,back, chest  . Pulmonary nodule 04-11-11   "PT. NOT AWARE" -denies problems with breathing  . Renal calculus   . Thyroid disease    hypothyroid   Past Surgical History:  Procedure Laterality Date  . cataract surgery  12/2013  . COLONOSCOPY WITH PROPOFOL N/A 02/05/2017   Procedure: COLONOSCOPY WITH PROPOFOL;  Surgeon: Ladene Artist, MD;  Location: [facility];  Service: Endoscopy;  Laterality: N/A;  . COSMETIC SURGERY  2000   Keloid injections  . CYSTOSCOPY WITH BIOPSY  04/14/2011   Procedure: CYSTOSCOPY WITH BIOPSY;  Surgeon: Dutch Gray, MD;  Location: [facility];  Service: Urology;  Laterality: N/A;  Bladder Biopsies   . CYSTOSCOPY/RETROGRADE/URETEROSCOPY  04/14/2011   Procedure: CYSTOSCOPY/RETROGRADE/URETEROSCOPY;  Surgeon: Dutch Gray, MD;  Location: [facility];  Service: Urology;  Laterality: Bilateral;  (Bil) RPG   . LAPAROSCOPIC NEPHRECTOMY Right 05/21/2017   Procedure: LAPAROSCOPIC RADICAL NEPHRECTOMY;  Surgeon: Raynelle Bring, MD;  Location: [facility];  Service: Urology;  Laterality: Right;  . ortho surgery on fingers  2005 & 2008   Dr. Lorelle Formosa  finger release  . PROSTATECTOMY  1999   Dr. Rosana Hoes   Social History   Socioeconomic History  . Marital status: Married    Spouse name: Not on file  . Number of children: 2  . Years of education: Not on file  . Highest education level: Not on file  Occupational History  . Occupation: retired from [employer]  . Smoking status: Never Smoker  . Smokeless tobacco: Never Used  Vaping Use  . Vaping Use: Never used  Substance and Sexual Activity  . Alcohol use: No    Alcohol/week: 0.0 standard drinks  . Drug use: No  . Sexual activity: Yes  Other Topics Concern  . Not on file  Social History Narrative  . Not on file   Social Determinants of Health   Financial Resource Strain: Not on file  Food Insecurity: Not on file  Transportation Needs: Not on file  Physical Activity: Not on file  Stress: Not on file  Social Connections: Not on file   Outpatient Encounter Medications as of 06/03/2020  Medication Sig  . Blood Glucose Monitoring Suppl (ACCU-CHEK GUIDE ME) w/Device KIT 1 Piece by Does not apply route as directed.  Marland Kitchen glucose blood (ACCU-CHEK GUIDE) test strip Use to test glucose 3 times a day  . allopurinol (ZYLOPRIM) 300 MG tablet TAKE 1 TABLET BY MOUTH ONCE DAILY  . amLODipine (NORVASC) 10 MG tablet TAKE 1 TABLET EVERY MORNING  .  aspirin EC 81 MG tablet Take 81 mg by mouth daily. Swallow whole.  . [facility] D3 5000 units capsule TAKE 1 CAPSULE BY MOUTH DAILY (Patient not taking: Reported on 03/04/2020)  . diphenhydrAMINE (BENADRYL) 25 MG tablet Take 25 mg by mouth daily as needed for allergies.  Marland Kitchen donepezil (ARICEPT) 5 MG tablet donepezil 5 mg tablet  TAKE 1 TABLET BY MOUTH EVERYDAY AT BEDTIME  . fluticasone (CUTIVATE) 0.05 % cream Apply 1 application 2 (two) times a week topically. To face for itching and flaking (Patient not taking: Reported on 03/04/2020)  . fluticasone (FLONASE) 50 MCG/ACT nasal spray Place 2 sprays daily as needed into both nostrils for allergies  or rhinitis.  Marland Kitchen gabapentin (NEURONTIN) 600 MG tablet Take 600-1,200 mg by mouth See admin instructions. Take 1200 mg in the morning and 600 mg at night  . hydrochlorothiazide (HYDRODIURIL) 25 MG tablet TAKE 1 TABLET BY MOUTH EVERY DAY  . insulin NPH-regular Human (NOVOLIN 70/30) (70-30) 100 UNIT/ML injection Inject 25 units with breakfast, 25 units with supper when premeal blood glucose readings above 90 mg per DL .  Marland Kitchen Insulin Syringe-Needle U-100 (BD VEO INSULIN SYRINGE U/F) 31G X 15/64" 1 ML MISC USE TWICE A DAY  To inject insulin  . Lancets (ONETOUCH DELICA PLUS [id number]) MISC USE TO TEST BLOOD SUGAR TWICE DAILY.  . metoprolol tartrate (LOPRESSOR) 25 MG tablet Take 25 mg by mouth 2 (two) times daily.  Marland Kitchen omeprazole (PRILOSEC) 20 MG capsule Take 1 capsule (20 mg total) by mouth daily.  Marland Kitchen Propylene Glycol (SYSTANE BALANCE OP) Apply 1 drop as needed to eye (irritation).  . Rivaroxaban (XARELTO) 15 MG TABS tablet Take 15 mg by mouth daily.   . rosuvastatin (CRESTOR) 20 MG tablet TAKE 1 TABLET BY MOUTH EVERY DAY  . [DISCONTINUED] BD VEO INSULIN SYR ULTRAFINE 31G X 15/64" 1 ML MISC USE TWICE A DAY AS DIRECTED  . [DISCONTINUED] glucose blood (ONETOUCH ULTRA) test strip USE TO TEST BLOOD SUGAR TWICE DAILY.  . [DISCONTINUED] Lancets (ONETOUCH DELICA PLUS [id number]) MISC USE TO TEST BLOOD SUGAR TWICE DAILY.  . [DISCONTINUED] simvastatin (ZOCOR) 40 MG tablet Takes 1/2 tablet daily bedtime   No facility-administered encounter medications on file as of 06/03/2020.   ALLERGIES: Allergies  Allergen Reactions  . Ace Inhibitors Swelling    REACTION: angio edema (swelling of lips)  . Metformin And Related     Itching in higher doses    VACCINATION STATUS: Immunization History  Administered Date(s) Administered  . Influenza Split 11/27/2011, 12/30/2012, 01/01/2014, 03/13/2017  . Influenza Whole 01/27/2009, 11/30/2009, 11/17/2010  . Influenza, High Dose Seasonal PF 12/21/2015, 01/04/2018  .  Pneumococcal Conjugate-13 02/04/2018  . Pneumococcal Polysaccharide-23 12/02/2008    Diabetes He presents for his follow-up diabetic visit. He has type 2 diabetes mellitus. Onset time: Diagnosed approximately  at age 72. His disease course has been improving. Pertinent negatives for hypoglycemia include no confusion, headaches, pallor or seizures. Pertinent negatives for diabetes include no chest pain, no fatigue, no polydipsia, no polyphagia, no polyuria and no weakness. Symptoms are improving. Diabetic complications include nephropathy. Risk factors for coronary artery disease include diabetes mellitus, dyslipidemia, male sex, hypertension and sedentary lifestyle. Current diabetic treatment includes insulin injections. His weight is fluctuating minimally. He is following a generally unhealthy diet. Meal planning includes ADA exchanges. He participates in exercise intermittently. His home blood glucose trend is decreasing steadily. His breakfast blood glucose range is generally 140-180 mg/dl. His overall blood glucose range is 140-180 mg/dl. (He  presents with controlled glycemic profile to target both fasting and postprandial.  His point-of-care A1c 7%, progressively improving.  He has no major hypoglycemia documented or reported.  ) An ACE inhibitor/angiotensin II receptor blocker is contraindicated.  Hypertension This is a chronic problem. The current episode started more than 1 year ago. The problem is controlled. Pertinent negatives include no chest pain, headaches, neck pain, palpitations or shortness of breath. Past treatments include calcium channel blockers.  Hyperlipidemia This is a chronic problem. The current episode started more than 1 year ago. The problem is uncontrolled. Pertinent negatives include no chest pain, myalgias or shortness of breath. Current antihyperlipidemic treatment includes bile acid squestrants. Risk factors for coronary artery disease include dyslipidemia and diabetes  mellitus.    Review of systems Limited as above.   Objective:    BP 138/75   Pulse (!) 55   Ht '6\' 1"'  (1.854 m)   Wt 214 lb (97.1 kg)   BMI 28.23 kg/m   Wt Readings from Last 3 Encounters:  06/03/20 214 lb (97.1 kg)  04/29/20 212 lb 9.6 oz (96.4 kg)  03/04/20 214 lb (97.1 kg)      Recent Results (from the past 2160 hour(s))  HgB A1c     Status: None   Collection Time: 06/03/20 11:00 AM  Result Value Ref Range   Hemoglobin A1C     HbA1c POC (<> result, manual entry)     HbA1c, POC (prediabetic range)     HbA1c, POC (controlled diabetic range) 7.0 0.0 - 7.0 %   Lipid Panel     Component Value Date/Time   CHOL 155 10/29/2019 1502   TRIG 89 10/29/2019 1502   HDL 46 10/29/2019 1502   CHOLHDL 3.4 10/29/2019 1502   CHOLHDL 4.8 11/20/2017 0745   VLDL 28 03/17/2016 0714   LDLCALC 92 10/29/2019 1502   LDLCALC 152 (H) 11/20/2017 0745   LDLDIRECT 147.5 06/10/2012 0930     Assessment & Plan:   1. Type 2 diabetes mellitus with stage 2 chronic kidney disease  He presents with controlled glycemic profile to target both fasting and postprandial.  His point-of-care A1c 7%, progressively improving.  He has no major hypoglycemia documented or reported.     Patient is advised to stick to the recommended insulin dose and a routine mealtimes to eat 3 meals  a day and avoid unnecessary snacks ( to snack only to correct hypoglycemia). - he acknowledges that there is a room for improvement in his food and drink choices. - Suggestion is made for him to avoid simple carbohydrates  from his diet including Cakes, Sweet Desserts, Ice Cream, Soda (diet and regular), Sweet Tea, Candies, Chips, Cookies, Store Bought Juices, Alcohol in Excess of  1-2 drinks a day, Artificial Sweeteners,  Coffee Creamer, and "Sugar-free" Products, Lemonade. This will help patient to have more stable blood glucose profile and potentially avoid unintended weight gain.  -He worries about hypoglycemia, and he is at  risk for hypoglycemia from inadvertent use of insulin.  -He is advised to continue Novolin 70/30 25 units with breakfast and 25 units with supper  only when pre-meal blood glucose is above 90.  - He is advised to continue glucose monitoring at least 2 times daily-before breakfast and at supper and anytime as needed. - Patient is instructed to call back with extremes of blood glucose readings  less than 70 or greater than 300 mg/dl. -He is not a candidate for Metformin, SGLT2 inhibitors, nor incretin therapy.  2. Mixed hyperlipidemia - His repeat lipid panel showed significant improvement in his LDL at 92 from 166. He is tolerating Crestor, advised to continue Crestor 20 mg nightly.   - He is advised to continue with omega-3 fatty acids ,avoid butter and fried food and exercise regularly.  3. Essential hypertension  His blood pressure is controlled to target.   He is currently on amlodipine 10 mg p.o. daily, metoprolol 50 mg daily.  I discussed and added hydrochlorothiazide 25 mg p.o. daily at breakfast for better control of hypertension.    4) vitamin D deficiency -He is on ongoing supplement with vitamin D3  5000 units daily for the next 90 days.   5) multinodular goiter: -She was recently seen for work-up of multinodular goiter.  It was observed that he has had stable euthyroid multinodular goiter documented for the last 7 years.  He will not need biopsy or surgical intervention for this at this time.  7 years of stability makes it unnecessary to do another thyroid imaging.  His recent thyroid function test and within normal state, he will need yearly thyroid function test.    POCT ABI Results 06/03/20  Her screening point-of-care ABI is normal today June 03, 2020.  The study will be repeated in March 2027, or sooner if needed. Right ABI: 1.14      left ABI: 1.20  Right leg systolic / diastolic: 321/22 mmHg Left leg systolic / diastolic: 482/50 mmHg  Arm systolic / diastolic:  037/04 mmHG   He is advised to continue close follow-up with his PCP Dr. Grier Mitts.   - Time spent on this patient care encounter:  45 min, of which > 50% was spent in  counseling and the rest reviewing his blood glucose logs , discussing his hypoglycemia and hyperglycemia episodes, reviewing his current and  previous labs / studies  ( including abstraction from other facilities) and medications  doses and developing a  long term treatment plan and documenting his care.   Please refer to Patient Instructions for Blood Glucose Monitoring and Insulin/Medications Dosing Guide"  in media tab for additional information. Please  also refer to " Patient Self Inventory" in the Media  tab for reviewed elements of pertinent patient history.  Grace Blight participated in the discussions, expressed understanding, and voiced agreement with the above plans.  All questions were answered to his satisfaction. he is encouraged to contact clinic should he have any questions or concerns prior to his return visit.   Follow up plan: Return in about 3 months (around 09/03/2020) for F/U with Pre-visit Labs, Meter, Logs, A1c here.Glade Lloyd, MD Phone: [phone]  Fax: [phone]  -  This note was partially dictated with voice recognition software. Similar sounding words can be transcribed inadequately or may not  be corrected upon review.  06/03/2020, 12:35 PM

## 2020-06-03 NOTE — Patient Instructions (Signed)
                                     Advice for Weight Management  -For most of us the best way to lose weight is by diet management. Generally speaking, diet management means consuming less calories intentionally which over time brings about progressive weight loss.  This can be achieved more effectively by restricting carbohydrate consumption to the minimum possible.  So, it is critically important to know your numbers: how much calorie you are consuming and how much calorie you need. More importantly, our carbohydrates sources should be unprocessed or minimally processed complex starch food items.   Sometimes, it is important to balance nutrition by increasing protein intake (animal or plant source), fruits, and vegetables.  -Sticking to a routine mealtime to eat 3 meals a day and avoiding unnecessary snacks is shown to have a big role in weight control. Under normal circumstances, the only time we lose real weight is when we are hungry, so allow hunger to take place- hunger means no food between meal times, only water.  It is not advisable to starve.   -It is better to avoid simple carbohydrates including: Cakes, Sweet Desserts, Ice Cream, Soda (diet and regular), Sweet Tea, Candies, Chips, Cookies, Store Bought Juices, Alcohol in Excess of  1-2 drinks a day, Lemonade,  Artificial Sweeteners, Doughnuts, Coffee Creamers, "Sugar-free" Products, etc, etc.  This is not a complete list.....    -Consulting with certified diabetes educators is proven to provide you with the most accurate and current information on diet.  Also, you may be  interested in discussing diet options/exchanges , we can schedule a visit with Hector Rose, RDN, CDE for individualized nutrition education.  -Exercise: If you are able: 30 -60 minutes a day ,4 days a week, or 150 minutes a week.  The longer the better.  Combine stretch, strength, and aerobic activities.  If you were told in the  past that you have high risk for cardiovascular diseases, you may seek evaluation by your heart doctor prior to initiating moderate to intense exercise programs.                                  Additional Care Considerations for Diabetes   -Diabetes  is a chronic disease.  The most important care consideration is regular follow-up with your diabetes care provider with the goal being avoiding or delaying its complications and to take advantage of advances in medications and technology.    -Type 2 diabetes is known to coexist with other important comorbidities such as high blood pressure and high cholesterol.  It is critical to control not only the diabetes but also the high blood pressure and high cholesterol to minimize and delay the risk of complications including coronary artery disease, stroke, amputations, blindness, etc.    - Studies showed that people with diabetes will benefit from a class of medications known as ACE inhibitors and statins.  Unless there are specific reasons not to be on these medications, the standard of care is to consider getting one from these groups of medications at an optimal doses.  These medications are generally considered safe and proven to help protect the heart and the kidneys.    - People with diabetes are encouraged to initiate and maintain regular follow-up with eye doctors, foot   doctors, dentists , and if necessary heart and kidney doctors.     - It is highly recommended that people with diabetes quit smoking or stay away from smoking, and get yearly  flu vaccine and pneumonia vaccine at least every 5 years.  One other important lifestyle recommendation is to ensure adequate sleep - at least 6-7 hours of uninterrupted sleep at night.  -Exercise: If you are able: 30 -60 minutes a day, 4 days a week, or 150 minutes a week.  The longer the better.  Combine stretch, strength, and aerobic activities.  If you were told in the past that you have high risk for  cardiovascular diseases, you may seek evaluation by your heart doctor prior to initiating moderate to intense exercise programs.          

## 2020-06-14 ENCOUNTER — Other Ambulatory Visit: Payer: Self-pay

## 2020-06-14 ENCOUNTER — Encounter: Payer: Self-pay | Admitting: Emergency Medicine

## 2020-06-14 ENCOUNTER — Ambulatory Visit
Admission: EM | Admit: 2020-06-14 | Discharge: 2020-06-14 | Disposition: A | Discharge status: Home / Self Care | Payer: PPO | Attending: Family Medicine | Admitting: Family Medicine

## 2020-06-14 DIAGNOSIS — J0140 Acute pansinusitis, unspecified: Secondary | ICD-10-CM

## 2020-06-14 DIAGNOSIS — R040 Epistaxis: Secondary | ICD-10-CM

## 2020-06-14 DIAGNOSIS — Z7901 Long term (current) use of anticoagulants: Secondary | ICD-10-CM

## 2020-06-14 MED ORDER — AMOXICILLIN 500 MG PO TABS: 500.0000 mg | ORAL_TABLET | Freq: Two times a day (BID) | ORAL | 0 refills | Status: AC

## 2020-06-14 NOTE — Discharge Instructions (Signed)
Afrin 2 spray as needed for nose bleeding only. Continue Flonase and Allegra.

## 2020-06-14 NOTE — ED Triage Notes (Signed)
Sinus congestion x 3 weeks.

## 2020-06-14 NOTE — ED Provider Notes (Signed)
[facility]    CSN: [id number] Arrival date & time: 06/14/20  0940      History   Chief Complaint No chief complaint on file.   HPI Hector Rose is a 80 y.o. male.   HPI  Patient presents with URI symptoms including cough, nasal congestion, nasal bleeding, and sinus pressure. No known sick contacts. Denies SOB, chest pain, or generalized weakness. Patient has experienced intermittent nasal bleeding, he is chronically anticoagulated with Xarelto. Suffers from chronic seasonal allergies and takes Allegra.    Past Medical History:  Diagnosis Date  . Asthma 04-11-11   AS CHILD ONLY  . BPH (benign prostatic hypertrophy) with urinary obstruction   . Carpal tunnel syndrome    patient denies at 05/14/17 preop visit   . Difficult intubation 04-11-11   08-11-97 some issues with intubation/record with chart  . DJD (degenerative joint disease)   . DM (diabetes mellitus) ([place]) 04-11-11   tyep II   . DVT femoral (deep venous thrombosis) with thrombophlebitis ([employer]) 04-11-11   ?'09-tx. on Xarelton x 3-4 years   . GERD (gastroesophageal reflux disease) 04-11-11   mild, no med in 1 month  . Gout 04-11-11    ankle 2 yrs ago  . Hemorrhoids   . Hypercholesteremia   . Hypertension   . Internal hemorrhoids   . Keloid 04-11-11   multiple-arms,back, chest  . Pulmonary nodule 04-11-11   "PT. NOT AWARE" -denies problems with breathing  . Renal calculus   . Thyroid disease    hypothyroid    Patient Active Problem List   Diagnosis Date Noted  . Multinodular goiter 04/29/2020  . S/p nephrectomy 02/04/2018  . Neoplasm of right kidney 05/21/2017  . Vitamin D deficiency 03/28/2017  . Special screening for malignant neoplasms, colon   . Benign neoplasm of transverse colon   . History of DVT (deep vein thrombosis) 07/20/2016  . Renal lesion 07/20/2016  . Type 2 diabetes mellitus with stage 3a chronic kidney disease, with long-term current use of insulin ([place]) 01/15/2014  . Chronic  idiopathic gout of multiple sites 01/15/2014  . Calcified pleural plaque on chest x-ray 06/30/2013  . Tinea pedis 09/28/2011  . Hematuria 05/19/2011  . HEMORRHOIDS 05/29/2007  . Pulmonary nodule 03/27/2007  . GERD 03/27/2007  . Benign prostatic hyperplasia with urinary obstruction 03/27/2007  . KELOID 03/27/2007  . Mixed hyperlipidemia 01/24/2007  . CARPAL TUNNEL SYNDROME 01/24/2007  . Essential hypertension 01/24/2007  . RENAL CALCULUS 01/24/2007  . Osteoarthritis 01/24/2007    Past Surgical History:  Procedure Laterality Date  . cataract surgery  12/2013  . COLONOSCOPY WITH PROPOFOL N/A 02/05/2017   Procedure: COLONOSCOPY WITH PROPOFOL;  Surgeon: Ladene Artist, MD;  [facility];  Service: Endoscopy;  Laterality: N/A;  . COSMETIC SURGERY  2000   Keloid injections  . CYSTOSCOPY WITH BIOPSY  04/14/2011   Procedure: CYSTOSCOPY WITH BIOPSY;  Surgeon: Dutch Gray, MD;  [facility];  Service: Urology;  Laterality: N/A;  Bladder Biopsies   . CYSTOSCOPY/RETROGRADE/URETEROSCOPY  04/14/2011   Procedure: CYSTOSCOPY/RETROGRADE/URETEROSCOPY;  Surgeon: Dutch Gray, MD;  Location: [facility];  Service: Urology;  Laterality: Bilateral;  (Bil) RPG   . LAPAROSCOPIC NEPHRECTOMY Right 05/21/2017   Procedure: LAPAROSCOPIC RADICAL NEPHRECTOMY;  Surgeon: Raynelle Bring, MD;  [facility];  Service: Urology;  Laterality: Right;  . ortho surgery on fingers  2005 & 2008   Dr. Lorelle Formosa finger release  . PROSTATECTOMY  1999   Dr. Rosana Hoes  Home Medications    Prior to Admission medications   Medication Sig Start Date End Date Taking? Authorizing Provider  amoxicillin (AMOXIL) 500 MG tablet Take 1 tablet (500 mg total) by mouth 2 (two) times daily for 7 days. 06/14/20 06/21/20 Yes Scot Jun, FNP  allopurinol (ZYLOPRIM) 300 MG tablet TAKE 1 TABLET BY MOUTH ONCE DAILY 06/06/13   Noralee Space, MD  amLODipine (NORVASC) 10 MG tablet TAKE 1 TABLET EVERY MORNING    Noralee Space, MD  aspirin EC 81 MG tablet Take 81 mg by mouth daily. Swallow whole.    [provider]  Blood Glucose Monitoring Suppl (ACCU-CHEK GUIDE ME) w/Device KIT 1 Piece by Does not apply route as directed. 06/03/20   Cassandria Anger, MD  [facility] D3 5000 units capsule TAKE 1 CAPSULE BY MOUTH DAILY Patient not taking: Reported on 03/04/2020 11/26/17   Cassandria Anger, MD  diphenhydrAMINE (BENADRYL) 25 MG tablet Take 25 mg by mouth daily as needed for allergies.    [provider]  donepezil (ARICEPT) 5 MG tablet donepezil 5 mg tablet  TAKE 1 TABLET BY MOUTH EVERYDAY AT BEDTIME    [provider]  fluticasone (CUTIVATE) 0.05 % cream Apply 1 application 2 (two) times a week topically. To face for itching and flaking Patient not taking: Reported on 03/04/2020 01/08/17   [provider]  fluticasone (FLONASE) 50 MCG/ACT nasal spray Place 2 sprays daily as needed into both nostrils for allergies or rhinitis.    [provider]  gabapentin (NEURONTIN) 600 MG tablet Take 600-1,200 mg by mouth See admin instructions. Take 1200 mg in the morning and 600 mg at night    [provider]  glucose blood (ACCU-CHEK GUIDE) test strip Use to test glucose 3 times a day 06/03/20   Cassandria Anger, MD  hydrochlorothiazide (HYDRODIURIL) 25 MG tablet TAKE 1 TABLET BY MOUTH EVERY DAY 04/28/20   Cassandria Anger, MD  insulin NPH-regular Human (NOVOLIN 70/30) (70-30) 100 UNIT/ML injection Inject 25 units with breakfast, 25 units with supper when premeal blood glucose readings above 90 mg per DL . 06/30/19   Nida, Marella Chimes, MD  Insulin Syringe-Needle U-100 (BD VEO INSULIN SYRINGE U/F) 31G X 15/64" 1 ML MISC USE TWICE A DAY  To inject insulin 06/03/20   Nida, Marella Chimes, MD  Lancets (ONETOUCH DELICA PLUS [id number]) MISC USE TO TEST BLOOD SUGAR TWICE DAILY. 06/03/20   Cassandria Anger, MD  metoprolol tartrate (LOPRESSOR) 25 MG tablet Take 25 mg by  mouth 2 (two) times daily.    [provider]  omeprazole (PRILOSEC) 20 MG capsule Take 1 capsule (20 mg total) by mouth daily. 02/10/20   Maudie Flakes, MD  Propylene Glycol (SYSTANE BALANCE OP) Apply 1 drop as needed to eye (irritation).    [provider]  Rivaroxaban (XARELTO) 15 MG TABS tablet Take 15 mg by mouth daily.     [provider]  rosuvastatin (CRESTOR) 20 MG tablet TAKE 1 TABLET BY MOUTH EVERY DAY 03/22/20   Cassandria Anger, MD  simvastatin (ZOCOR) 40 MG tablet Takes 1/2 tablet daily bedtime 02/13/11 05/19/11  Noralee Space, MD    Family History History reviewed. No pertinent family history.  Social History Social History   Tobacco Use  . Smoking status: Never Smoker  . Smokeless tobacco: Never Used  Vaping Use  . Vaping Use: Never used  Substance Use Topics  . Alcohol use: No  Alcohol/week: 0.0 standard drinks  . Drug use: No     Allergies   Ace inhibitors and Metformin and related   Review of Systems Review of Systems Pertinent negatives listed in HPI  Physical Exam Triage Vital Signs ED Triage Vitals [06/14/20 0950]  Enc Vitals Group     BP 118/70     Pulse Rate 62     Resp 18     Temp 97.9 F (36.6 C)     Temp Source Oral     SpO2 97 %     Weight      Height      Head Circumference      Peak Flow      Pain Score 0     Pain Loc      Pain Edu?      Excl. in [place]?    No data found.  Updated Vital Signs BP 118/70 (BP Location: Right Arm)   Pulse 62   Temp 97.9 F (36.6 C) (Oral)   Resp 18   SpO2 97%   Visual Acuity Right Eye Distance:   Left Eye Distance:   Bilateral Distance:    Right Eye Near:   Left Eye Near:    Bilateral Near:     Physical Exam  General Appearance:    Alert, cooperative, no distress  HENT:   Normocephalic, ears normal, nares mucosal edema with congestion, no active bleed (residual dry blood present r/l turbinate)  rhinorrhea, oropharynx normal   Eyes:    PERRL,  conjunctiva/corneas clear, EOM's intact       Lungs:     Clear to auscultation bilaterally, respirations unlabored  Heart:    Regular rate and rhythm  Neurologic:   Awake, alert, oriented x 3. No apparent focal neurological           defect.      UC Treatments / Results  Labs (all labs ordered are listed, but only abnormal results are displayed) Labs Reviewed - No data to display  EKG   Radiology No results found.  Procedures Procedures (including critical care time)  Medications Ordered in UC Medications - No data to display  Initial Impression / Assessment and Plan / UC Course  I have reviewed the triage vital signs and the nursing notes.  Pertinent labs & imaging results that were available during my care of the patient were reviewed by me and considered in my medical decision making (see chart for details).     Start Amoxicillin 500 mg twice daily x 7 days (patient has one kidney with reduced  GFR. Continue Allegra. Continue Flonase. Afrin prescribed PRN for acute epistaxis, sample given in clinic. Follow-up with PCP as needed. If nose bleeding becomes persistent and is unresponsive to Afrin and pressure, go to ER.  Final Clinical Impressions(s) / UC Diagnoses   Final diagnoses:  Epistaxis  Acute non-recurrent pansinusitis  Chronic anticoagulation     Discharge Instructions     Afrin 2 spray as needed for nose bleeding only. Continue Flonase and Allegra.      ED Prescriptions    Medication Sig Dispense Auth. Provider   amoxicillin (AMOXIL) 500 MG tablet Take 1 tablet (500 mg total) by mouth 2 (two) times daily for 7 days. 14 tablet Scot Jun, FNP     PDMP not reviewed this encounter.   Scot Jun, FNP 06/18/20 2355

## 2020-06-16 DIAGNOSIS — M25561 Pain in right knee: Secondary | ICD-10-CM | POA: Diagnosis not present

## 2020-06-16 DIAGNOSIS — M25562 Pain in left knee: Secondary | ICD-10-CM | POA: Diagnosis not present

## 2020-08-04 ENCOUNTER — Other Ambulatory Visit: Payer: Self-pay | Admitting: Urology

## 2020-08-04 DIAGNOSIS — Z85528 Personal history of other malignant neoplasm of kidney: Secondary | ICD-10-CM

## 2020-08-04 DIAGNOSIS — D49512 Neoplasm of unspecified behavior of left kidney: Secondary | ICD-10-CM

## 2020-08-17 DIAGNOSIS — M25561 Pain in right knee: Secondary | ICD-10-CM | POA: Diagnosis not present

## 2020-08-17 DIAGNOSIS — M25551 Pain in right hip: Secondary | ICD-10-CM | POA: Diagnosis not present

## 2020-08-20 DIAGNOSIS — E785 Hyperlipidemia, unspecified: Secondary | ICD-10-CM | POA: Diagnosis not present

## 2020-08-20 DIAGNOSIS — R58 Hemorrhage, not elsewhere classified: Secondary | ICD-10-CM | POA: Diagnosis not present

## 2020-08-20 DIAGNOSIS — R1310 Dysphagia, unspecified: Secondary | ICD-10-CM | POA: Diagnosis not present

## 2020-08-20 DIAGNOSIS — Z905 Acquired absence of kidney: Secondary | ICD-10-CM | POA: Diagnosis not present

## 2020-08-20 DIAGNOSIS — I10 Essential (primary) hypertension: Secondary | ICD-10-CM | POA: Diagnosis not present

## 2020-08-20 DIAGNOSIS — J385 Laryngeal spasm: Secondary | ICD-10-CM | POA: Diagnosis not present

## 2020-08-20 DIAGNOSIS — E1122 Type 2 diabetes mellitus with diabetic chronic kidney disease: Secondary | ICD-10-CM | POA: Diagnosis not present

## 2020-08-20 DIAGNOSIS — Z7984 Long term (current) use of oral hypoglycemic drugs: Secondary | ICD-10-CM | POA: Diagnosis not present

## 2020-08-20 DIAGNOSIS — I129 Hypertensive chronic kidney disease with stage 1 through stage 4 chronic kidney disease, or unspecified chronic kidney disease: Secondary | ICD-10-CM | POA: Diagnosis not present

## 2020-08-20 DIAGNOSIS — I493 Ventricular premature depolarization: Secondary | ICD-10-CM | POA: Diagnosis not present

## 2020-08-20 DIAGNOSIS — Z794 Long term (current) use of insulin: Secondary | ICD-10-CM | POA: Diagnosis not present

## 2020-08-20 DIAGNOSIS — Z9889 Other specified postprocedural states: Secondary | ICD-10-CM | POA: Diagnosis not present

## 2020-08-20 DIAGNOSIS — Z79899 Other long term (current) drug therapy: Secondary | ICD-10-CM | POA: Diagnosis not present

## 2020-08-20 DIAGNOSIS — Z7901 Long term (current) use of anticoagulants: Secondary | ICD-10-CM | POA: Diagnosis not present

## 2020-08-20 DIAGNOSIS — R0902 Hypoxemia: Secondary | ICD-10-CM | POA: Diagnosis not present

## 2020-08-20 DIAGNOSIS — K226 Gastro-esophageal laceration-hemorrhage syndrome: Secondary | ICD-10-CM | POA: Diagnosis not present

## 2020-08-20 DIAGNOSIS — E119 Type 2 diabetes mellitus without complications: Secondary | ICD-10-CM | POA: Diagnosis not present

## 2020-08-20 DIAGNOSIS — N189 Chronic kidney disease, unspecified: Secondary | ICD-10-CM | POA: Diagnosis not present

## 2020-08-20 DIAGNOSIS — Z86718 Personal history of other venous thrombosis and embolism: Secondary | ICD-10-CM | POA: Diagnosis not present

## 2020-08-21 DIAGNOSIS — E119 Type 2 diabetes mellitus without complications: Secondary | ICD-10-CM | POA: Diagnosis not present

## 2020-08-21 DIAGNOSIS — R1310 Dysphagia, unspecified: Secondary | ICD-10-CM | POA: Diagnosis not present

## 2020-08-21 DIAGNOSIS — K226 Gastro-esophageal laceration-hemorrhage syndrome: Secondary | ICD-10-CM | POA: Diagnosis not present

## 2020-08-21 DIAGNOSIS — Z794 Long term (current) use of insulin: Secondary | ICD-10-CM | POA: Diagnosis not present

## 2020-08-21 DIAGNOSIS — I10 Essential (primary) hypertension: Secondary | ICD-10-CM | POA: Diagnosis not present

## 2020-08-23 ENCOUNTER — Ambulatory Visit
Admission: RE | Admit: 2020-08-23 | Discharge: 2020-08-23 | Disposition: A | Discharge status: Home / Self Care | Payer: HMO | Source: Ambulatory Visit | Attending: Urology | Admitting: Urology

## 2020-08-23 ENCOUNTER — Other Ambulatory Visit: Payer: Self-pay

## 2020-08-23 DIAGNOSIS — D49512 Neoplasm of unspecified behavior of left kidney: Secondary | ICD-10-CM

## 2020-08-23 DIAGNOSIS — J929 Pleural plaque without asbestos: Secondary | ICD-10-CM | POA: Diagnosis not present

## 2020-08-23 DIAGNOSIS — Z9889 Other specified postprocedural states: Secondary | ICD-10-CM | POA: Diagnosis not present

## 2020-08-23 DIAGNOSIS — Z85528 Personal history of other malignant neoplasm of kidney: Secondary | ICD-10-CM

## 2020-08-23 DIAGNOSIS — Z905 Acquired absence of kidney: Secondary | ICD-10-CM | POA: Diagnosis not present

## 2020-08-23 DIAGNOSIS — N2889 Other specified disorders of kidney and ureter: Secondary | ICD-10-CM | POA: Diagnosis not present

## 2020-08-23 MED ORDER — GADOBENATE DIMEGLUMINE 529 MG/ML IV SOLN
15.0000 mL | Freq: Once | INTRAVENOUS | Status: AC | PRN
  Administered 2020-08-23: 15 mL via INTRAVENOUS

## 2020-08-25 ENCOUNTER — Other Ambulatory Visit (HOSPITAL_COMMUNITY): Payer: Self-pay | Admitting: Urology

## 2020-08-25 ENCOUNTER — Ambulatory Visit (HOSPITAL_COMMUNITY)
Admission: RE | Admit: 2020-08-25 | Discharge: 2020-08-25 | Disposition: A | Discharge status: Home / Self Care | Payer: HMO | Source: Ambulatory Visit | Attending: Urology | Admitting: Urology

## 2020-08-25 ENCOUNTER — Other Ambulatory Visit: Payer: Self-pay

## 2020-08-25 DIAGNOSIS — M47814 Spondylosis without myelopathy or radiculopathy, thoracic region: Secondary | ICD-10-CM | POA: Diagnosis not present

## 2020-08-25 DIAGNOSIS — Z85528 Personal history of other malignant neoplasm of kidney: Secondary | ICD-10-CM | POA: Diagnosis not present

## 2020-08-25 DIAGNOSIS — D49512 Neoplasm of unspecified behavior of left kidney: Secondary | ICD-10-CM

## 2020-08-30 DIAGNOSIS — N1831 Chronic kidney disease, stage 3a: Secondary | ICD-10-CM | POA: Diagnosis not present

## 2020-08-30 DIAGNOSIS — Z794 Long term (current) use of insulin: Secondary | ICD-10-CM | POA: Diagnosis not present

## 2020-08-30 DIAGNOSIS — E042 Nontoxic multinodular goiter: Secondary | ICD-10-CM | POA: Diagnosis not present

## 2020-08-30 DIAGNOSIS — E1122 Type 2 diabetes mellitus with diabetic chronic kidney disease: Secondary | ICD-10-CM | POA: Diagnosis not present

## 2020-08-31 ENCOUNTER — Other Ambulatory Visit: Payer: Self-pay | Admitting: "Endocrinology

## 2020-08-31 LAB — TSH: TSH: 1.23 u[IU]/mL (ref 0.450–4.500)

## 2020-08-31 LAB — COMPREHENSIVE METABOLIC PANEL
ALT: 17 IU/L (ref 0–44)
AST: 22 IU/L (ref 0–40)
Albumin/Globulin Ratio: 1.7 (ref 1.2–2.2)
Albumin: 4.4 g/dL (ref 3.7–4.7)
Alkaline Phosphatase: 80 IU/L (ref 44–121)
BUN/Creatinine Ratio: 11 (ref 10–24)
BUN: 24 mg/dL (ref 8–27)
Bilirubin Total: 0.3 mg/dL (ref 0.0–1.2)
CO2: 29 mmol/L (ref 20–29)
Calcium: 9.7 mg/dL (ref 8.6–10.2)
Chloride: 98 mmol/L (ref 96–106)
Creatinine, Ser: 2.26 mg/dL — ABNORMAL HIGH (ref 0.76–1.27)
Globulin, Total: 2.6 g/dL (ref 1.5–4.5)
Glucose: 111 mg/dL — ABNORMAL HIGH (ref 65–99)
Potassium: 4.4 mmol/L (ref 3.5–5.2)
Sodium: 141 mmol/L (ref 134–144)
Total Protein: 7 g/dL (ref 6.0–8.5)
eGFR: 29 mL/min/{1.73_m2} — ABNORMAL LOW (ref >59)

## 2020-08-31 LAB — T4, FREE: Free T4: 1.04 ng/dL (ref 0.82–1.77)

## 2020-09-03 ENCOUNTER — Other Ambulatory Visit: Payer: Self-pay

## 2020-09-03 ENCOUNTER — Ambulatory Visit: Payer: HMO | Admitting: "Endocrinology

## 2020-09-03 ENCOUNTER — Encounter: Payer: Self-pay | Admitting: "Endocrinology

## 2020-09-03 VITALS — BP 120/78 | HR 56 | Ht 73.0 in | Wt 209.2 lb

## 2020-09-03 DIAGNOSIS — E1122 Type 2 diabetes mellitus with diabetic chronic kidney disease: Secondary | ICD-10-CM

## 2020-09-03 DIAGNOSIS — Z794 Long term (current) use of insulin: Secondary | ICD-10-CM

## 2020-09-03 DIAGNOSIS — E782 Mixed hyperlipidemia: Secondary | ICD-10-CM | POA: Diagnosis not present

## 2020-09-03 DIAGNOSIS — I10 Essential (primary) hypertension: Secondary | ICD-10-CM | POA: Diagnosis not present

## 2020-09-03 DIAGNOSIS — N1831 Chronic kidney disease, stage 3a: Secondary | ICD-10-CM

## 2020-09-03 LAB — POCT GLYCOSYLATED HEMOGLOBIN (HGB A1C): HbA1c, POC (controlled diabetic range): 7.5 % — AB (ref 0.0–7.0)

## 2020-09-03 NOTE — Progress Notes (Signed)
09/03/2020  Endocrinology follow-up note  Subjective:    Patient ID: Hector Rose, male    DOB: [birth year],    Past Medical History:  Diagnosis Date   Asthma 04-11-11   AS CHILD ONLY   BPH (benign prostatic hypertrophy) with urinary obstruction    Carpal tunnel syndrome    patient denies at 05/14/17 preop visit    Difficult intubation 04-11-11   08-11-97 some issues with intubation/record with chart   DJD (degenerative joint disease)    DM (diabetes mellitus) ([place]) 04-11-11   tyep II    DVT femoral (deep venous thrombosis) with thrombophlebitis ([place]) 04-11-11   ?'09-tx. on Xarelton x 3-4 years    GERD (gastroesophageal reflux disease) 04-11-11   mild, no med in 1 month   Gout 04-11-11    ankle 2 yrs ago   Hemorrhoids    Hypercholesteremia    Hypertension    Internal hemorrhoids    Keloid 04-11-11   multiple-arms,back, chest   Pulmonary nodule 04-11-11   "PT. NOT AWARE" -denies problems with breathing   Renal calculus    Thyroid disease    hypothyroid   Past Surgical History:  Procedure Laterality Date   cataract surgery  12/2013   COLONOSCOPY WITH PROPOFOL N/A 02/05/2017   Procedure: COLONOSCOPY WITH PROPOFOL;  Surgeon: Ladene Artist, MD;  Location: [facility];  Service: Endoscopy;  Laterality: N/A;   COSMETIC SURGERY  2000   Keloid injections   CYSTOSCOPY WITH BIOPSY  04/14/2011   Procedure: CYSTOSCOPY WITH BIOPSY;  Surgeon: Dutch Gray, MD;  Location: [facility];  Service: Urology;  Laterality: N/A;  Bladder Biopsies    CYSTOSCOPY/RETROGRADE/URETEROSCOPY  04/14/2011   Procedure: CYSTOSCOPY/RETROGRADE/URETEROSCOPY;  Surgeon: Dutch Gray, MD;  Location: [facility];  Service: Urology;  Laterality: Bilateral;  (Bil) RPG    LAPAROSCOPIC NEPHRECTOMY Right 05/21/2017   Procedure: LAPAROSCOPIC RADICAL NEPHRECTOMY;  Surgeon: Raynelle Bring, MD;  Location: [facility];  Service: Urology;  Laterality: Right;   ortho surgery on fingers  2005 & 2008   Dr. Lorelle Formosa finger release    PROSTATECTOMY  1999   Dr. Rosana Hoes   Social History   Socioeconomic History   Marital status: Married    Spouse name: Not on file   Number of children: 2   Years of education: Not on file   Highest education level: Not on file  Occupational History   Occupation: retired from [employer] Use   Smoking status: Never   Smokeless tobacco: Never  Vaping Use   Vaping Use: Never used  Substance and Sexual Activity   Alcohol use: No    Alcohol/week: 0.0 standard drinks   Drug use: No   Sexual activity: Yes  Other Topics Concern   Not on file  Social History Narrative   Not on file   Social Determinants of Health   Financial Resource Strain: Not on file  Food Insecurity: Not on file  Transportation Needs: Not on file  Physical Activity: Not on file  Stress: Not on file  Social Connections: Not on file   Outpatient Encounter Medications as of 09/03/2020  Medication Sig   allopurinol (ZYLOPRIM) 300 MG tablet TAKE 1 TABLET BY MOUTH ONCE DAILY   amLODipine (NORVASC) 10 MG tablet TAKE 1 TABLET EVERY MORNING   aspirin EC 81 MG tablet Take 81 mg by mouth daily. Swallow whole.   Blood Glucose Monitoring Suppl (ACCU-CHEK GUIDE ME) w/Device KIT 1 Piece by Does not apply route as directed.   diphenhydrAMINE (BENADRYL)  25 MG tablet Take 25 mg by mouth daily as needed for allergies.   donepezil (ARICEPT) 5 MG tablet donepezil 5 mg tablet  TAKE 1 TABLET BY MOUTH EVERYDAY AT BEDTIME   fluticasone (FLONASE) 50 MCG/ACT nasal spray Place 2 sprays daily as needed into both nostrils for allergies or rhinitis.   gabapentin (NEURONTIN) 600 MG tablet Take 600-1,200 mg by mouth See admin instructions. Take 1200 mg in the morning and 600 mg at night   hydrochlorothiazide (HYDRODIURIL) 25 MG tablet TAKE 1 TABLET BY MOUTH EVERY DAY   insulin NPH-regular Human (NOVOLIN 70/30) (70-30) 100 UNIT/ML injection Inject 25 units with breakfast, 25 units with supper when premeal blood glucose readings above 90  mg per DL .   Insulin Syringe-Needle U-100 (BD VEO INSULIN SYRINGE U/F) 31G X 15/64" 1 ML MISC USE TWICE A DAY  To inject insulin   Lancets (ONETOUCH DELICA PLUS [id number]) MISC USE TO TEST BLOOD SUGAR TWICE DAILY.   metoprolol tartrate (LOPRESSOR) 25 MG tablet Take 25 mg by mouth 2 (two) times daily.   omeprazole (PRILOSEC) 20 MG capsule Take 1 capsule (20 mg total) by mouth daily.   ONETOUCH ULTRA test strip USE TO TEST BLOOD SUGAR TWICE DAILY   Propylene Glycol (SYSTANE BALANCE OP) Apply 1 drop as needed to eye (irritation).   Rivaroxaban (XARELTO) 15 MG TABS tablet Take 15 mg by mouth daily.    rosuvastatin (CRESTOR) 20 MG tablet TAKE 1 TABLET BY MOUTH EVERY DAY (Patient taking differently: Take 10 mg by mouth daily.)   [DISCONTINUED] [facility] D3 5000 units capsule TAKE 1 CAPSULE BY MOUTH DAILY (Patient not taking: Reported on 03/04/2020)   [DISCONTINUED] fluticasone (CUTIVATE) 0.05 % cream Apply 1 application 2 (two) times a week topically. To face for itching and flaking (Patient not taking: Reported on 03/04/2020)   [DISCONTINUED] simvastatin (ZOCOR) 40 MG tablet Takes 1/2 tablet daily bedtime   No facility-administered encounter medications on file as of 09/03/2020.   ALLERGIES: Allergies  Allergen Reactions   Ace Inhibitors Swelling    REACTION: angio edema (swelling of lips)   Allopurinol    Metformin And Related     Itching in higher doses    VACCINATION STATUS: Immunization History  Administered Date(s) Administered   Influenza Split 11/27/2011, 12/30/2012, 01/01/2014, 03/13/2017   Influenza Whole 01/27/2009, 11/30/2009, 11/17/2010   Influenza, High Dose Seasonal PF 12/21/2015, 01/04/2018   Pneumococcal Conjugate-13 02/04/2018   Pneumococcal Polysaccharide-23 12/02/2008    Diabetes He presents for his follow-up diabetic visit. He has type 2 diabetes mellitus. Onset time: Diagnosed approximately  at age 62. His disease course has been fluctuating. Pertinent negatives for  hypoglycemia include no confusion, headaches, pallor or seizures. Pertinent negatives for diabetes include no chest pain, no fatigue, no polydipsia, no polyphagia, no polyuria and no weakness. Symptoms are improving. Diabetic complications include nephropathy. Risk factors for coronary artery disease include diabetes mellitus, dyslipidemia, male sex, hypertension and sedentary lifestyle. Current diabetic treatment includes insulin injections. His weight is fluctuating minimally. He is following a generally unhealthy diet. Meal planning includes ADA exchanges. He participates in exercise intermittently. His home blood glucose trend is decreasing steadily. His breakfast blood glucose range is generally 140-180 mg/dl. His overall blood glucose range is 140-180 mg/dl. (He presents with fluctuating glycemic profile.  His point-of-care A1c 7.5% increasing from 7%.  He did not document any major hypoglycemia.  ) An ACE inhibitor/angiotensin II receptor blocker is contraindicated.  Hypertension This is a chronic problem. The current episode  started more than 1 year ago. The problem is controlled. Pertinent negatives include no chest pain, headaches, neck pain, palpitations or shortness of breath. Risk factors for coronary artery disease include dyslipidemia, diabetes mellitus, male gender and sedentary lifestyle. Past treatments include calcium channel blockers.  Hyperlipidemia This is a chronic problem. The current episode started more than 1 year ago. The problem is uncontrolled. Exacerbating diseases include diabetes. Pertinent negatives include no chest pain, myalgias or shortness of breath. Current antihyperlipidemic treatment includes bile acid squestrants. Risk factors for coronary artery disease include dyslipidemia and diabetes mellitus.   Review of systems Limited as above.   Objective:    BP 120/78   Pulse (!) 56   Ht _0  (1.854 m)   Wt 209 lb 3.2 oz (94.9 kg)   BMI 27.60 kg/m   Wt Readings  from Last 3 Encounters:  09/03/20 209 lb 3.2 oz (94.9 kg)  06/03/20 214 lb (97.1 kg)  04/29/20 212 lb 9.6 oz (96.4 kg)      Recent Results (from the past 2160 hour(s))  Comprehensive metabolic panel     Status: Abnormal   Collection Time: 08/30/20  9:09 AM  Result Value Ref Range   Glucose 111 (H) 65 - 99 mg/dL   BUN 24 8 - 27 mg/dL   Creatinine, Ser 2.26 (H) 0.76 - 1.27 mg/dL   eGFR 29 (L) >59 mL/min/1.73   BUN/Creatinine Ratio 11 10 - 24   Sodium 141 134 - 144 mmol/L   Potassium 4.4 3.5 - 5.2 mmol/L   Chloride 98 96 - 106 mmol/L   CO2 29 20 - 29 mmol/L   Calcium 9.7 8.6 - 10.2 mg/dL   Total Protein 7.0 6.0 - 8.5 g/dL   Albumin 4.4 3.7 - 4.7 g/dL   Globulin, Total 2.6 1.5 - 4.5 g/dL   Albumin/Globulin Ratio 1.7 1.2 - 2.2   Bilirubin Total 0.3 0.0 - 1.2 mg/dL   Alkaline Phosphatase 80 44 - 121 IU/L   AST 22 0 - 40 IU/L   ALT 17 0 - 44 IU/L  TSH     Status: None   Collection Time: 08/30/20  9:09 AM  Result Value Ref Range   TSH 1.230 0.450 - 4.500 uIU/mL  T4, free     Status: None   Collection Time: 08/30/20  9:09 AM  Result Value Ref Range   Free T4 1.04 0.82 - 1.77 ng/dL  HgB A1c     Status: Abnormal   Collection Time: 09/03/20 10:54 AM  Result Value Ref Range   Hemoglobin A1C     HbA1c POC (<> result, manual entry)     HbA1c, POC (prediabetic range)     HbA1c, POC (controlled diabetic range) 7.5 (A) 0.0 - 7.0 %   Lipid Panel     Component Value Date/Time   CHOL 155 10/29/2019 1502   TRIG 89 10/29/2019 1502   HDL 46 10/29/2019 1502   CHOLHDL 3.4 10/29/2019 1502   CHOLHDL 4.8 11/20/2017 0745   VLDL 28 03/17/2016 0714   LDLCALC 92 10/29/2019 1502   LDLCALC 152 (H) 11/20/2017 0745   LDLDIRECT 147.5 06/10/2012 0930     Assessment & Plan:   1. Type 2 diabetes mellitus with stage 2 chronic kidney disease  He presents with fluctuating glycemic profile.  His point-of-care A1c 7.5% increasing from 7%.  He did not document any major hypoglycemia.     Patient  is advised to stick to the recommended insulin dose and a  routine mealtimes to eat 3 meals  a day and avoid unnecessary snacks ( to snack only to correct hypoglycemia).  - he acknowledges that there is a room for improvement in his food and drink choices. - Suggestion is made for him to avoid simple carbohydrates  from his diet including Cakes, Sweet Desserts, Ice Cream, Soda (diet and regular), Sweet Tea, Candies, Chips, Cookies, Store Bought Juices, Alcohol in Excess of  1-2 drinks a day, Artificial Sweeteners,  Coffee Creamer, and "Sugar-free" Products, Lemonade. This will help patient to have more stable blood glucose profile and potentially avoid unintended weight gain.   -He worries about hypoglycemia, and he is at risk for hypoglycemia from inadvertent use of insulin.  His priority will be to avoid hypoglycemia in this patient. -He is advised to continue Novolin 70/30 25 units with breakfast and 25 units with supper only when his Premeal blood glucose readings are above 90 mg/day.    - He is advised to continue glucose monitoring at least 2 times daily-before breakfast and at supper and anytime as needed. - Patient is instructed to call back with extremes of blood glucose readings  less than 70 or greater than 300 mg/dl. -He is not a candidate for Metformin, SGLT2 inhibitors, nor incretin therapy.     2. Mixed hyperlipidemia - His repeat lipid panel showed significant improvement in his LDL at 92 from 166. He is tolerating Crestor, advised to continue Crestor 20 mg p.o. nightly.     - He is advised to continue with omega-3 fatty acids ,avoid butter and fried food and exercise regularly.  3. Essential hypertension  -His blood pressure is controlled to target.   He is currently on amlodipine 10 mg p.o. daily, metoprolol 50 mg daily.  I discussed and added hydrochlorothiazide 25 mg p.o. daily at breakfast for better control of hypertension.    4) vitamin D deficiency -He is on ongoing  supplement with vitamin D3  5000 units daily for the next 90 days.   5) multinodular goiter: -She was recently seen for work-up of multinodular goiter.  It was observed that he has had stable euthyroid multinodular goiter documented for the last 7 years.  He will not need biopsy or surgical intervention for this at this time.  7 years of stability makes it unnecessary to do another thyroid imaging.  His recent thyroid function test and within normal state, he will need yearly thyroid function test.  His recent screening ABI was negative for PAD on June 03, 2020.  His neck study will be repeated in March 2027, or sooner if needed.   He is advised to continue close follow-up with his PCP Dr. Grier Mitts.   I spent 41 minutes in the care of the patient today including review of labs from [place], Lipids, Thyroid Function, Hematology (current and previous including abstractions from other facilities); face-to-face time discussing  his blood glucose readings/logs, discussing hypoglycemia and hyperglycemia episodes and symptoms, medications doses, his options of short and long term treatment based on the latest standards of care / guidelines;  discussion about incorporating lifestyle medicine;  and documenting the encounter.    Please refer to Patient Instructions for Blood Glucose Monitoring and Insulin/Medications Dosing Guide"  in media tab for additional information. Please  also refer to " Patient Self Inventory" in the Media  tab for reviewed elements of pertinent patient history.  Hector Rose participated in the discussions, expressed understanding, and voiced agreement with the above plans.  All questions  were answered to his satisfaction. he is encouraged to contact clinic should he have any questions or concerns prior to his return visit.   Follow up plan: Return in about 6 months (around 03/05/2021) for F/U with Pre-visit Labs, Meter, Logs, A1c here.Hector Lloyd, MD Phone: [phone]   Fax: [phone]  -  This note was partially dictated with voice recognition software. Similar sounding words can be transcribed inadequately or may not  be corrected upon review.  09/03/2020, 4:16 PM

## 2020-09-03 NOTE — Patient Instructions (Signed)

## 2020-09-08 DIAGNOSIS — E1140 Type 2 diabetes mellitus with diabetic neuropathy, unspecified: Secondary | ICD-10-CM | POA: Diagnosis not present

## 2020-09-08 DIAGNOSIS — N401 Enlarged prostate with lower urinary tract symptoms: Secondary | ICD-10-CM | POA: Diagnosis not present

## 2020-09-08 DIAGNOSIS — E1129 Type 2 diabetes mellitus with other diabetic kidney complication: Secondary | ICD-10-CM | POA: Diagnosis not present

## 2020-09-08 DIAGNOSIS — E663 Overweight: Secondary | ICD-10-CM | POA: Diagnosis not present

## 2020-09-08 DIAGNOSIS — C679 Malignant neoplasm of bladder, unspecified: Secondary | ICD-10-CM | POA: Diagnosis not present

## 2020-09-08 DIAGNOSIS — R001 Bradycardia, unspecified: Secondary | ICD-10-CM | POA: Diagnosis not present

## 2020-09-08 DIAGNOSIS — Z6829 Body mass index (BMI) 29.0-29.9, adult: Secondary | ICD-10-CM | POA: Diagnosis not present

## 2020-09-08 DIAGNOSIS — N182 Chronic kidney disease, stage 2 (mild): Secondary | ICD-10-CM | POA: Diagnosis not present

## 2020-09-08 DIAGNOSIS — M1991 Primary osteoarthritis, unspecified site: Secondary | ICD-10-CM | POA: Diagnosis not present

## 2020-09-08 DIAGNOSIS — Z0001 Encounter for general adult medical examination with abnormal findings: Secondary | ICD-10-CM | POA: Diagnosis not present

## 2020-09-08 DIAGNOSIS — Z1331 Encounter for screening for depression: Secondary | ICD-10-CM | POA: Diagnosis not present

## 2020-09-19 ENCOUNTER — Other Ambulatory Visit: Payer: Self-pay | Admitting: "Endocrinology

## 2020-09-24 ENCOUNTER — Other Ambulatory Visit: Payer: Self-pay | Admitting: "Endocrinology

## 2020-10-01 ENCOUNTER — Other Ambulatory Visit: Payer: Self-pay | Admitting: "Endocrinology

## 2020-10-03 ENCOUNTER — Other Ambulatory Visit: Payer: Self-pay | Admitting: "Endocrinology

## 2020-11-04 DIAGNOSIS — M65351 Trigger finger, right little finger: Secondary | ICD-10-CM | POA: Diagnosis not present

## 2020-11-04 DIAGNOSIS — M79641 Pain in right hand: Secondary | ICD-10-CM | POA: Diagnosis not present

## 2020-12-09 DIAGNOSIS — Z23 Encounter for immunization: Secondary | ICD-10-CM | POA: Diagnosis not present

## 2021-02-14 DIAGNOSIS — M545 Low back pain, unspecified: Secondary | ICD-10-CM | POA: Diagnosis not present

## 2021-02-14 DIAGNOSIS — Z6827 Body mass index (BMI) 27.0-27.9, adult: Secondary | ICD-10-CM | POA: Diagnosis not present

## 2021-02-14 DIAGNOSIS — R309 Painful micturition, unspecified: Secondary | ICD-10-CM | POA: Diagnosis not present

## 2021-02-14 DIAGNOSIS — N401 Enlarged prostate with lower urinary tract symptoms: Secondary | ICD-10-CM | POA: Diagnosis not present

## 2021-02-14 DIAGNOSIS — E663 Overweight: Secondary | ICD-10-CM | POA: Diagnosis not present

## 2021-02-14 DIAGNOSIS — M1991 Primary osteoarthritis, unspecified site: Secondary | ICD-10-CM | POA: Diagnosis not present

## 2021-02-14 DIAGNOSIS — C679 Malignant neoplasm of bladder, unspecified: Secondary | ICD-10-CM | POA: Diagnosis not present

## 2021-02-17 DIAGNOSIS — C679 Malignant neoplasm of bladder, unspecified: Secondary | ICD-10-CM | POA: Diagnosis not present

## 2021-02-25 ENCOUNTER — Ambulatory Visit: Payer: HMO | Admitting: "Endocrinology

## 2021-02-25 DIAGNOSIS — N1831 Chronic kidney disease, stage 3a: Secondary | ICD-10-CM | POA: Diagnosis not present

## 2021-02-25 DIAGNOSIS — E782 Mixed hyperlipidemia: Secondary | ICD-10-CM | POA: Diagnosis not present

## 2021-02-25 DIAGNOSIS — Z794 Long term (current) use of insulin: Secondary | ICD-10-CM | POA: Diagnosis not present

## 2021-02-25 DIAGNOSIS — E1122 Type 2 diabetes mellitus with diabetic chronic kidney disease: Secondary | ICD-10-CM | POA: Diagnosis not present

## 2021-02-26 LAB — COMPREHENSIVE METABOLIC PANEL
ALT: 16 IU/L (ref 0–44)
AST: 21 IU/L (ref 0–40)
Albumin/Globulin Ratio: 1.9 (ref 1.2–2.2)
Albumin: 4.5 g/dL (ref 3.7–4.7)
Alkaline Phosphatase: 83 IU/L (ref 44–121)
BUN/Creatinine Ratio: 7 — ABNORMAL LOW (ref 10–24)
BUN: 17 mg/dL (ref 8–27)
Bilirubin Total: 0.5 mg/dL (ref 0.0–1.2)
CO2: 28 mmol/L (ref 20–29)
Calcium: 9.1 mg/dL (ref 8.6–10.2)
Chloride: 101 mmol/L (ref 96–106)
Creatinine, Ser: 2.3 mg/dL — ABNORMAL HIGH (ref 0.76–1.27)
Globulin, Total: 2.4 g/dL (ref 1.5–4.5)
Glucose: 128 mg/dL — ABNORMAL HIGH (ref 70–99)
Potassium: 3.6 mmol/L (ref 3.5–5.2)
Sodium: 145 mmol/L — ABNORMAL HIGH (ref 134–144)
Total Protein: 6.9 g/dL (ref 6.0–8.5)
eGFR: 28 mL/min/{1.73_m2} — ABNORMAL LOW (ref >59)

## 2021-02-26 LAB — LIPID PANEL
Chol/HDL Ratio: 4.2 ratio (ref 0.0–5.0)
Cholesterol, Total: 167 mg/dL (ref 100–199)
HDL: 40 mg/dL (ref >39)
LDL Chol Calc (NIH): 108 mg/dL — ABNORMAL HIGH (ref 0–99)
Triglycerides: 106 mg/dL (ref 0–149)
VLDL Cholesterol Cal: 19 mg/dL (ref 5–40)

## 2021-03-08 ENCOUNTER — Ambulatory Visit: Payer: HMO | Admitting: "Endocrinology

## 2021-03-15 ENCOUNTER — Other Ambulatory Visit: Payer: Self-pay

## 2021-03-15 ENCOUNTER — Encounter: Payer: Self-pay | Admitting: "Endocrinology

## 2021-03-15 ENCOUNTER — Ambulatory Visit: Payer: HMO | Admitting: "Endocrinology

## 2021-03-15 VITALS — BP 124/66 | HR 52 | Ht 73.0 in | Wt 209.0 lb

## 2021-03-15 DIAGNOSIS — N1831 Chronic kidney disease, stage 3a: Secondary | ICD-10-CM

## 2021-03-15 DIAGNOSIS — E782 Mixed hyperlipidemia: Secondary | ICD-10-CM | POA: Diagnosis not present

## 2021-03-15 DIAGNOSIS — I10 Essential (primary) hypertension: Secondary | ICD-10-CM | POA: Diagnosis not present

## 2021-03-15 DIAGNOSIS — E1122 Type 2 diabetes mellitus with diabetic chronic kidney disease: Secondary | ICD-10-CM | POA: Diagnosis not present

## 2021-03-15 DIAGNOSIS — Z794 Long term (current) use of insulin: Secondary | ICD-10-CM

## 2021-03-15 LAB — POCT GLYCOSYLATED HEMOGLOBIN (HGB A1C): HbA1c, POC (controlled diabetic range): 7.4 % — AB (ref 0.0–7.0)

## 2021-03-15 MED ORDER — FREESTYLE LIBRE 2 SENSOR MISC: 1.0000 | 3 refills | Status: DC

## 2021-03-15 MED ORDER — FREESTYLE LIBRE 2 READER DEVI: 0 refills | Status: DC

## 2021-03-15 NOTE — Progress Notes (Signed)
03/15/2021  Endocrinology follow-up note  Subjective:    Patient ID: Hector Rose, male    DOB: 06/27/1940,    Past Medical History:  Diagnosis Date   Asthma 04-11-11   AS CHILD ONLY   BPH (benign prostatic hypertrophy) with urinary obstruction    Carpal tunnel syndrome    patient denies at 05/14/17 preop visit    Difficult intubation 04-11-11   08-11-97 some issues with intubation/record with chart   DJD (degenerative joint disease)    DM (diabetes mellitus) ([place]) 04-11-11   tyep II    DVT femoral (deep venous thrombosis) with thrombophlebitis ([place]) 04-11-11   ?'09-tx. on Xarelton x 3-4 years    GERD (gastroesophageal reflux disease) 04-11-11   mild, no med in 1 month   Gout 04-11-11    ankle 2 yrs ago   Hemorrhoids    Hypercholesteremia    Hypertension    Internal hemorrhoids    Keloid 04-11-11   multiple-arms,back, chest   Pulmonary nodule 04-11-11   "PT. NOT AWARE" -denies problems with breathing   Renal calculus    Thyroid disease    hypothyroid   Past Surgical History:  Procedure Laterality Date   cataract surgery  12/2013   COLONOSCOPY WITH PROPOFOL N/A 02/05/2017   Procedure: COLONOSCOPY WITH PROPOFOL;  Surgeon: Ladene Artist, MD;  Location: [facility];  Service: Endoscopy;  Laterality: N/A;   COSMETIC SURGERY  2000   Keloid injections   CYSTOSCOPY WITH BIOPSY  04/14/2011   Procedure: CYSTOSCOPY WITH BIOPSY;  Surgeon: Dutch Gray, MD;  Location: [facility];  Service: Urology;  Laterality: N/A;  Bladder Biopsies    CYSTOSCOPY/RETROGRADE/URETEROSCOPY  04/14/2011   Procedure: CYSTOSCOPY/RETROGRADE/URETEROSCOPY;  Surgeon: Dutch Gray, MD;  Location: [facility];  Service: Urology;  Laterality: Bilateral;  (Bil) RPG    LAPAROSCOPIC NEPHRECTOMY Right 05/21/2017   Procedure: LAPAROSCOPIC RADICAL NEPHRECTOMY;  Surgeon: Raynelle Bring, MD;  Location: [facility];  Service: Urology;  Laterality: Right;   ortho surgery on fingers  2005 & 2008   Dr. Lorelle Formosa finger release    PROSTATECTOMY  1999   Dr. Rosana Hoes   Social History   Socioeconomic History   Marital status: Married    Spouse name: Not on file   Number of children: 2   Years of education: Not on file   Highest education level: Not on file  Occupational History   Occupation: retired from [employer]   Smoking status: Never   Smokeless tobacco: Never  Vaping Use   Vaping Use: Never used  Substance and Sexual Activity   Alcohol use: No    Alcohol/week: 0.0 standard drinks   Drug use: No   Sexual activity: Yes  Other Topics Concern   Not on file  Social History Narrative   Not on file   Social Determinants of Health   Financial Resource Strain: Not on file  Food Insecurity: Not on file  Transportation Needs: Not on file  Physical Activity: Not on file  Stress: Not on file  Social Connections: Not on file   Outpatient Encounter Medications as of 03/15/2021  Medication Sig   Continuous Blood Gluc Receiver (FREESTYLE LIBRE 2 READER) DEVI As directed   Continuous Blood Gluc Sensor (FREESTYLE LIBRE 2 SENSOR) MISC 1 Piece by Does not apply route every 14 (fourteen) days.   allopurinol (ZYLOPRIM) 300 MG tablet TAKE 1 TABLET BY MOUTH ONCE DAILY (Patient taking differently: daily as needed. TAKE 1 TABLET BY MOUTH ONCE DAILY AS NEEDED)  amLODipine (NORVASC) 10 MG tablet TAKE 1 TABLET EVERY MORNING   aspirin EC 81 MG tablet Take 81 mg by mouth daily. Swallow whole.   Blood Glucose Monitoring Suppl (ACCU-CHEK GUIDE ME) w/Device KIT 1 Piece by Does not apply route as directed.   diphenhydrAMINE (BENADRYL) 25 MG tablet Take 25 mg by mouth daily as needed for allergies.   donepezil (ARICEPT) 5 MG tablet donepezil 5 mg tablet  TAKE 1 TABLET BY MOUTH EVERYDAY AT BEDTIME   fluticasone (FLONASE) 50 MCG/ACT nasal spray Place 2 sprays daily as needed into both nostrils for allergies or rhinitis.   gabapentin (NEURONTIN) 600 MG tablet Take 600-1,200 mg by mouth See admin instructions. Take 1200 mg  in the morning and 600 mg at night   hydrochlorothiazide (HYDRODIURIL) 25 MG tablet TAKE 1 TABLET BY MOUTH EVERY DAY   insulin NPH-regular Human (NOVOLIN 70/30) (70-30) 100 UNIT/ML injection Inject 25 units with breakfast, 25 units with supper when premeal blood glucose readings above 90 mg per DL . (Patient taking differently: Inject 20-25 units with breakfast, 20-25 units with supper when premeal blood glucose readings above 90 mg per DL .)   Insulin Syringe-Needle U-100 (BD VEO INSULIN SYRINGE U/F) 31G X 15/64" 1 ML MISC USE TWICE A DAY  To inject insulin   Lancets (ONETOUCH DELICA PLUS [id number]) MISC USE TO TEST BLOOD SUGAR TWICE DAILY.   metoprolol tartrate (LOPRESSOR) 25 MG tablet Take 25 mg by mouth 2 (two) times daily.   omeprazole (PRILOSEC) 20 MG capsule Take 1 capsule (20 mg total) by mouth daily. (Patient taking differently: Take 20 mg by mouth daily as needed.)   ONETOUCH ULTRA test strip USE TO TEST BLOOD SUGAR TWICE DAILY   Propylene Glycol (SYSTANE BALANCE OP) Apply 1 drop as needed to eye (irritation).   Rivaroxaban (XARELTO) 15 MG TABS tablet Take 15 mg by mouth daily.    [DISCONTINUED] rosuvastatin (CRESTOR) 20 MG tablet TAKE 1 TABLET BY MOUTH EVERY DAY   [DISCONTINUED] simvastatin (ZOCOR) 40 MG tablet Takes 1/2 tablet daily bedtime   No facility-administered encounter medications on file as of 03/15/2021.   ALLERGIES: Allergies  Allergen Reactions   Ace Inhibitors Swelling    REACTION: angio edema (swelling of lips)   Allopurinol    Metformin And Related     Itching in higher doses    VACCINATION STATUS: Immunization History  Administered Date(s) Administered   Influenza Split 11/27/2011, 12/30/2012, 01/01/2014, 03/13/2017   Influenza Whole 01/27/2009, 11/30/2009, 11/17/2010   Influenza, High Dose Seasonal PF 12/21/2015, 01/04/2018   Pneumococcal Conjugate-13 02/04/2018   Pneumococcal Polysaccharide-23 12/02/2008    Diabetes He presents for his follow-up diabetic  visit. He has type 2 diabetes mellitus. Onset time: Diagnosed approximately  at age 24. His disease course has been improving. Pertinent negatives for hypoglycemia include no confusion, headaches, pallor or seizures. Pertinent negatives for diabetes include no chest pain, no fatigue, no polydipsia, no polyphagia, no polyuria and no weakness. Symptoms are improving. Diabetic complications include nephropathy. Risk factors for coronary artery disease include diabetes mellitus, dyslipidemia, male sex, hypertension and sedentary lifestyle. Current diabetic treatment includes insulin injections. His weight is fluctuating minimally. He is following a generally unhealthy diet. Meal planning includes ADA exchanges. He participates in exercise intermittently. His home blood glucose trend is fluctuating minimally. His breakfast blood glucose range is generally 130-140 mg/dl. His overall blood glucose range is 140-180 mg/dl. (He presents with better glycemic profile, averaging 157-177 for the last 4 weeks , point-of-care A1c  of 7.4%.  He did not document or report any major hypoglycemia.   He is interested in a CGM device.  ) An ACE inhibitor/angiotensin II receptor blocker is contraindicated.  Hypertension This is a chronic problem. The current episode started more than 1 year ago. The problem is controlled. Pertinent negatives include no chest pain, headaches, neck pain, palpitations or shortness of breath. Risk factors for coronary artery disease include dyslipidemia, diabetes mellitus, male gender and sedentary lifestyle. Past treatments include calcium channel blockers.  Hyperlipidemia This is a chronic problem. The current episode started more than 1 year ago. The problem is uncontrolled. Exacerbating diseases include diabetes. Pertinent negatives include no chest pain, myalgias or shortness of breath. Current antihyperlipidemic treatment includes bile acid squestrants. Risk factors for coronary artery disease  include dyslipidemia and diabetes mellitus.   Review of systems Limited as above.   Objective:    BP 124/66    Pulse (!) 52    Ht '6\' 1"'  (1.854 m)    Wt 209 lb (94.8 kg)    BMI 27.57 kg/m   Wt Readings from Last 3 Encounters:  03/15/21 209 lb (94.8 kg)  09/03/20 209 lb 3.2 oz (94.9 kg)  06/03/20 214 lb (97.1 kg)      Recent Results (from the past 2160 hour(s))  Comprehensive metabolic panel     Status: Abnormal   Collection Time: 02/25/21  8:33 AM  Result Value Ref Range   Glucose 128 (H) 70 - 99 mg/dL   BUN 17 8 - 27 mg/dL   Creatinine, Ser 2.30 (H) 0.76 - 1.27 mg/dL   eGFR 28 (L) >59 mL/min/1.73   BUN/Creatinine Ratio 7 (L) 10 - 24   Sodium 145 (H) 134 - 144 mmol/L   Potassium 3.6 3.5 - 5.2 mmol/L   Chloride 101 96 - 106 mmol/L   CO2 28 20 - 29 mmol/L   Calcium 9.1 8.6 - 10.2 mg/dL   Total Protein 6.9 6.0 - 8.5 g/dL   Albumin 4.5 3.7 - 4.7 g/dL   Globulin, Total 2.4 1.5 - 4.5 g/dL   Albumin/Globulin Ratio 1.9 1.2 - 2.2   Bilirubin Total 0.5 0.0 - 1.2 mg/dL   Alkaline Phosphatase 83 44 - 121 IU/L   AST 21 0 - 40 IU/L   ALT 16 0 - 44 IU/L  Lipid panel     Status: Abnormal   Collection Time: 02/25/21  8:33 AM  Result Value Ref Range   Cholesterol, Total 167 100 - 199 mg/dL   Triglycerides 106 0 - 149 mg/dL   HDL 40 >39 mg/dL   VLDL Cholesterol Cal 19 5 - 40 mg/dL   LDL Chol Calc (NIH) 108 (H) 0 - 99 mg/dL   Chol/HDL Ratio 4.2 0.0 - 5.0 ratio    Comment:                                   T. Chol/HDL Ratio                                             Men  Women                               1/2 Avg.Risk  3.4  3.3                                   Avg.Risk  5.0    4.4                                2X Avg.Risk  9.6    7.1                                3X Avg.Risk 23.4   11.0   HgB A1c     Status: Abnormal   Collection Time: 03/15/21 11:20 AM  Result Value Ref Range   Hemoglobin A1C     HbA1c POC (<> result, manual entry)     HbA1c, POC (prediabetic range)      HbA1c, POC (controlled diabetic range) 7.4 (A) 0.0 - 7.0 %   Lipid Panel     Component Value Date/Time   CHOL 167 02/25/2021 0833   TRIG 106 02/25/2021 0833   HDL 40 02/25/2021 0833   CHOLHDL 4.2 02/25/2021 0833   CHOLHDL 4.8 11/20/2017 0745   VLDL 28 03/17/2016 0714   LDLCALC 108 (H) 02/25/2021 0833   LDLCALC 152 (H) 11/20/2017 0745   LDLDIRECT 147.5 06/10/2012 0930     Assessment & Plan:   1. Type 2 diabetes mellitus with stage 2 chronic kidney disease  He presents with better glycemic profile, averaging 157-177 for the last 4 weeks , point-of-care A1c of 7.4%.  He did not document or report any major hypoglycemia.   He is interested in a CGM device.     Patient is advised to stick to the recommended insulin dose and a routine mealtimes to eat 3 meals  a day and avoid unnecessary snacks ( to snack only to correct hypoglycemia).  - he acknowledges that there is a room for improvement in his food and drink choices. - Suggestion is made for him to avoid simple carbohydrates  from his diet including Cakes, Sweet Desserts, Ice Cream, Soda (diet and regular), Sweet Tea, Candies, Chips, Cookies, Store Bought Juices, Alcohol in Excess of  1-2 drinks a day, Artificial Sweeteners,  Coffee Creamer, and "Sugar-free" Products, Lemonade. This will help patient to have more stable blood glucose profile and potentially avoid unintended weight gain.  -He worries about hypoglycemia, and he is at risk for hypoglycemia from inadvertent use of insulin.  Priority in his care would be to avoid hypoglycemia.    -He is advised to continue Novolin 70/30 25 units with breakfast and 25 units with supper only when his Premeal blood glucose readings are above 90 mg/day.    - He is advised to continue glucose monitoring at least 2 times daily-before breakfast and at supper and anytime as needed. - Patient is instructed to call back with extremes of blood glucose readings  less than 70 or greater than 300  mg/dl. -He is not a candidate for Metformin, SGLT2 inhibitors, nor incretin therapy.   -I discussed and prescribed the freestyle libre device for him.   2. Mixed hyperlipidemia - His repeat lipid panel showed significant improvement in his LDL at 92 from 166.  He is tolerating Crestor, advised to continue Crestor 20 mg p.o. nightly.     - He is advised to continue with omega-3 fatty acids ,avoid butter  and fried food and exercise regularly.  3. Essential hypertension  -His blood pressure is controlled to target.   He is currently on amlodipine 10 mg p.o. daily, metoprolol 50 mg daily.  I discussed and added hydrochlorothiazide 25 mg p.o. daily at breakfast for better control of hypertension.    4) vitamin D deficiency -He is on ongoing supplement with vitamin D3  5000 units daily for the next 90 days.   5) multinodular goiter: -She was recently seen for work-up of multinodular goiter.  It was observed that he has had stable euthyroid multinodular goiter documented for the last 7 years.  He will not need biopsy or surgical intervention for this at this time.  7 years of stability makes it unnecessary to do another thyroid imaging.  His recent thyroid function test and within normal state, he will need yearly thyroid function test.  His recent screening ABI was negative for PAD on June 03, 2020.  His neck study will be repeated in March 2027, or sooner if needed.   He is advised to continue close follow-up with his PCP Dr. Grier Mitts.   I spent 40 minutes in the care of the patient today including review of labs from [place], Lipids, Thyroid Function, Hematology (current and previous including abstractions from other facilities); face-to-face time discussing  his blood glucose readings/logs, discussing hypoglycemia and hyperglycemia episodes and symptoms, medications doses, his options of short and long term treatment based on the latest standards of care / guidelines;  discussion about  incorporating lifestyle medicine;  and documenting the encounter.    Please refer to Patient Instructions for Blood Glucose Monitoring and Insulin/Medications Dosing Guide"  in media tab for additional information. Please  also refer to " Patient Self Inventory" in the Media  tab for reviewed elements of pertinent patient history.  Hector Rose participated in the discussions, expressed understanding, and voiced agreement with the above plans.  All questions were answered to his satisfaction. he is encouraged to contact clinic should he have any questions or concerns prior to his return visit.    Follow up plan: Return in about 6 months (around 09/12/2021) for Bring Meter and Logs- A1c in Office.  Glade Lloyd, MD Phone: [phone]  Fax: [phone]  -  This note was partially dictated with voice recognition software. Similar sounding words can be transcribed inadequately or may not  be corrected upon review.  03/15/2021, 12:49 PM

## 2021-03-15 NOTE — Patient Instructions (Signed)

## 2021-03-31 DIAGNOSIS — M545 Low back pain, unspecified: Secondary | ICD-10-CM | POA: Diagnosis not present

## 2021-04-05 ENCOUNTER — Other Ambulatory Visit: Payer: Self-pay | Admitting: Nurse Practitioner

## 2021-04-14 DIAGNOSIS — M5136 Other intervertebral disc degeneration, lumbar region: Secondary | ICD-10-CM | POA: Diagnosis not present

## 2021-05-06 DIAGNOSIS — M67879 Other specified disorders of synovium and tendon, unspecified ankle and foot: Secondary | ICD-10-CM | POA: Diagnosis not present

## 2021-05-06 DIAGNOSIS — M79671 Pain in right foot: Secondary | ICD-10-CM | POA: Diagnosis not present

## 2021-05-06 DIAGNOSIS — M7662 Achilles tendinitis, left leg: Secondary | ICD-10-CM | POA: Diagnosis not present

## 2021-05-09 ENCOUNTER — Other Ambulatory Visit: Payer: Self-pay | Admitting: "Endocrinology

## 2021-05-09 DIAGNOSIS — M109 Gout, unspecified: Secondary | ICD-10-CM | POA: Diagnosis not present

## 2021-05-09 DIAGNOSIS — E6609 Other obesity due to excess calories: Secondary | ICD-10-CM | POA: Diagnosis not present

## 2021-05-09 DIAGNOSIS — Z6829 Body mass index (BMI) 29.0-29.9, adult: Secondary | ICD-10-CM | POA: Diagnosis not present

## 2021-05-23 DIAGNOSIS — M7731 Calcaneal spur, right foot: Secondary | ICD-10-CM | POA: Diagnosis not present

## 2021-05-23 DIAGNOSIS — M79671 Pain in right foot: Secondary | ICD-10-CM | POA: Diagnosis not present

## 2021-05-23 DIAGNOSIS — R234 Changes in skin texture: Secondary | ICD-10-CM | POA: Diagnosis not present

## 2021-05-23 DIAGNOSIS — L851 Acquired keratosis [keratoderma] palmaris et plantaris: Secondary | ICD-10-CM | POA: Diagnosis not present

## 2021-06-22 DIAGNOSIS — J302 Other seasonal allergic rhinitis: Secondary | ICD-10-CM | POA: Diagnosis not present

## 2021-06-22 DIAGNOSIS — M25561 Pain in right knee: Secondary | ICD-10-CM | POA: Diagnosis not present

## 2021-06-22 DIAGNOSIS — E663 Overweight: Secondary | ICD-10-CM | POA: Diagnosis not present

## 2021-06-22 DIAGNOSIS — M1991 Primary osteoarthritis, unspecified site: Secondary | ICD-10-CM | POA: Diagnosis not present

## 2021-06-22 DIAGNOSIS — Z6829 Body mass index (BMI) 29.0-29.9, adult: Secondary | ICD-10-CM | POA: Diagnosis not present

## 2021-06-28 ENCOUNTER — Other Ambulatory Visit: Payer: Self-pay | Admitting: Urology

## 2021-06-28 DIAGNOSIS — D49512 Neoplasm of unspecified behavior of left kidney: Secondary | ICD-10-CM

## 2021-06-28 DIAGNOSIS — Z85528 Personal history of other malignant neoplasm of kidney: Secondary | ICD-10-CM

## 2021-07-11 DIAGNOSIS — Z6829 Body mass index (BMI) 29.0-29.9, adult: Secondary | ICD-10-CM | POA: Diagnosis not present

## 2021-07-11 DIAGNOSIS — M109 Gout, unspecified: Secondary | ICD-10-CM | POA: Diagnosis not present

## 2021-07-11 DIAGNOSIS — E663 Overweight: Secondary | ICD-10-CM | POA: Diagnosis not present

## 2021-08-15 ENCOUNTER — Other Ambulatory Visit: Payer: Self-pay | Admitting: Urology

## 2021-08-15 ENCOUNTER — Ambulatory Visit
Admission: RE | Admit: 2021-08-15 | Discharge: 2021-08-15 | Disposition: A | Discharge status: Home / Self Care | Payer: HMO | Source: Ambulatory Visit | Attending: Urology | Admitting: Urology

## 2021-08-15 DIAGNOSIS — M47814 Spondylosis without myelopathy or radiculopathy, thoracic region: Secondary | ICD-10-CM | POA: Diagnosis not present

## 2021-08-15 DIAGNOSIS — N289 Disorder of kidney and ureter, unspecified: Secondary | ICD-10-CM | POA: Diagnosis not present

## 2021-08-15 DIAGNOSIS — K8689 Other specified diseases of pancreas: Secondary | ICD-10-CM | POA: Diagnosis not present

## 2021-08-15 DIAGNOSIS — K5730 Diverticulosis of large intestine without perforation or abscess without bleeding: Secondary | ICD-10-CM | POA: Diagnosis not present

## 2021-08-15 DIAGNOSIS — E119 Type 2 diabetes mellitus without complications: Secondary | ICD-10-CM | POA: Diagnosis not present

## 2021-08-15 DIAGNOSIS — Z85528 Personal history of other malignant neoplasm of kidney: Secondary | ICD-10-CM | POA: Diagnosis not present

## 2021-08-15 DIAGNOSIS — D49512 Neoplasm of unspecified behavior of left kidney: Secondary | ICD-10-CM

## 2021-08-15 DIAGNOSIS — I10 Essential (primary) hypertension: Secondary | ICD-10-CM | POA: Diagnosis not present

## 2021-08-15 MED ORDER — GADOBENATE DIMEGLUMINE 529 MG/ML IV SOLN
20.0000 mL | Freq: Once | INTRAVENOUS | Status: AC | PRN
  Administered 2021-08-15: 20 mL via INTRAVENOUS

## 2021-08-22 DIAGNOSIS — M25561 Pain in right knee: Secondary | ICD-10-CM | POA: Diagnosis not present

## 2021-08-22 DIAGNOSIS — M25562 Pain in left knee: Secondary | ICD-10-CM | POA: Diagnosis not present

## 2021-08-25 ENCOUNTER — Other Ambulatory Visit: Payer: Self-pay | Admitting: "Endocrinology

## 2021-09-01 ENCOUNTER — Other Ambulatory Visit: Payer: Self-pay | Admitting: "Endocrinology

## 2021-09-02 DIAGNOSIS — D49512 Neoplasm of unspecified behavior of left kidney: Secondary | ICD-10-CM | POA: Diagnosis not present

## 2021-09-02 DIAGNOSIS — Z85528 Personal history of other malignant neoplasm of kidney: Secondary | ICD-10-CM | POA: Diagnosis not present

## 2021-09-06 DIAGNOSIS — E663 Overweight: Secondary | ICD-10-CM | POA: Diagnosis not present

## 2021-09-06 DIAGNOSIS — C679 Malignant neoplasm of bladder, unspecified: Secondary | ICD-10-CM | POA: Diagnosis not present

## 2021-09-06 DIAGNOSIS — C641 Malignant neoplasm of right kidney, except renal pelvis: Secondary | ICD-10-CM | POA: Diagnosis not present

## 2021-09-06 DIAGNOSIS — E1122 Type 2 diabetes mellitus with diabetic chronic kidney disease: Secondary | ICD-10-CM | POA: Diagnosis not present

## 2021-09-06 DIAGNOSIS — Z6828 Body mass index (BMI) 28.0-28.9, adult: Secondary | ICD-10-CM | POA: Diagnosis not present

## 2021-09-06 DIAGNOSIS — M1991 Primary osteoarthritis, unspecified site: Secondary | ICD-10-CM | POA: Diagnosis not present

## 2021-09-06 DIAGNOSIS — L03119 Cellulitis of unspecified part of limb: Secondary | ICD-10-CM | POA: Diagnosis not present

## 2021-09-06 DIAGNOSIS — I82501 Chronic embolism and thrombosis of unspecified deep veins of right lower extremity: Secondary | ICD-10-CM | POA: Diagnosis not present

## 2021-09-14 ENCOUNTER — Ambulatory Visit: Payer: HMO | Admitting: "Endocrinology

## 2021-09-14 ENCOUNTER — Encounter: Payer: Self-pay | Admitting: "Endocrinology

## 2021-09-14 VITALS — BP 122/58 | HR 52 | Ht 73.0 in | Wt 201.0 lb

## 2021-09-14 DIAGNOSIS — E782 Mixed hyperlipidemia: Secondary | ICD-10-CM | POA: Diagnosis not present

## 2021-09-14 DIAGNOSIS — N1831 Chronic kidney disease, stage 3a: Secondary | ICD-10-CM

## 2021-09-14 DIAGNOSIS — E1122 Type 2 diabetes mellitus with diabetic chronic kidney disease: Secondary | ICD-10-CM | POA: Diagnosis not present

## 2021-09-14 DIAGNOSIS — I10 Essential (primary) hypertension: Secondary | ICD-10-CM

## 2021-09-14 DIAGNOSIS — Z794 Long term (current) use of insulin: Secondary | ICD-10-CM

## 2021-09-14 LAB — POCT GLYCOSYLATED HEMOGLOBIN (HGB A1C): HbA1c, POC (controlled diabetic range): 7.5 % — AB (ref 0.0–7.0)

## 2021-09-14 MED ORDER — NOVOLIN 70/30 (70-30) 100 UNIT/ML ~~LOC~~ SUSP
SUBCUTANEOUS | 2 refills | Status: DC
Start: 2021-09-14 — End: 2022-05-18

## 2021-09-14 NOTE — Progress Notes (Signed)
09/14/2021  Endocrinology follow-up note  Subjective:    Patient ID: Hector Rose, male    DOB: [birth year],    Past Medical History:  Diagnosis Date   Asthma 04-11-11   AS CHILD ONLY   BPH (benign prostatic hypertrophy) with urinary obstruction    Carpal tunnel syndrome    patient denies at 05/14/17 preop visit    Difficult intubation 04-11-11   08-11-97 some issues with intubation/record with chart   DJD (degenerative joint disease)    DM (diabetes mellitus) ([place]) 04-11-11   tyep II    DVT femoral (deep venous thrombosis) with thrombophlebitis ([place]) 04-11-11   ?'09-tx. on Xarelton x 3-4 years    GERD (gastroesophageal reflux disease) 04-11-11   mild, no med in 1 month   Gout 04-11-11    ankle 2 yrs ago   Hemorrhoids    Hypercholesteremia    Hypertension    Internal hemorrhoids    Keloid 04-11-11   multiple-arms,back, chest   Pulmonary nodule 04-11-11   "PT. NOT AWARE" -denies problems with breathing   Renal calculus    Thyroid disease    hypothyroid   Past Surgical History:  Procedure Laterality Date   cataract surgery  12/2013   COLONOSCOPY WITH PROPOFOL N/A 02/05/2017   Procedure: COLONOSCOPY WITH PROPOFOL;  Surgeon: Ladene Artist, MD;  Location: [facility];  Service: Endoscopy;  Laterality: N/A;   COSMETIC SURGERY  2000   Keloid injections   CYSTOSCOPY WITH BIOPSY  04/14/2011   Procedure: CYSTOSCOPY WITH BIOPSY;  Surgeon: Dutch Gray, MD;  Location: [facility];  Service: Urology;  Laterality: N/A;  Bladder Biopsies    CYSTOSCOPY/RETROGRADE/URETEROSCOPY  04/14/2011   Procedure: CYSTOSCOPY/RETROGRADE/URETEROSCOPY;  Surgeon: Dutch Gray, MD;  Location: [facility];  Service: Urology;  Laterality: Bilateral;  (Bil) RPG    LAPAROSCOPIC NEPHRECTOMY Right 05/21/2017   Procedure: LAPAROSCOPIC RADICAL NEPHRECTOMY;  Surgeon: Raynelle Bring, MD;  Location: [facility];  Service: Urology;  Laterality: Right;   ortho surgery on fingers  2005 & 2008   Dr. Sypher-trigger finger release    PROSTATECTOMY  1999   Dr. Rosana Hoes   Social History   Socioeconomic History   Marital status: Married    Spouse name: Not on file   Number of children: 2   Years of education: Not on file   Highest education level: Not on file  Occupational History   Occupation: retired from [employer] Use   Smoking status: Never   Smokeless tobacco: Never  Vaping Use   Vaping Use: Never used  Substance and Sexual Activity   Alcohol use: No    Alcohol/week: 0.0 standard drinks of alcohol   Drug use: No   Sexual activity: Yes  Other Topics Concern   Not on file  Social History Narrative   Not on file   Social Determinants of Health   Financial Resource Strain: Not on file  Food Insecurity: Not on file  Transportation Needs: Not on file  Physical Activity: Not on file  Stress: Not on file  Social Connections: Not on file   Outpatient Encounter Medications as of 09/14/2021  Medication Sig   hydrochlorothiazide (HYDRODIURIL) 25 MG tablet Take 1 tablet by mouth daily.   allopurinol (ZYLOPRIM) 300 MG tablet TAKE 1 TABLET BY MOUTH ONCE DAILY (Patient taking differently: daily as needed. TAKE 1 TABLET BY MOUTH ONCE DAILY AS NEEDED)   amLODipine (NORVASC) 10 MG tablet TAKE 1 TABLET EVERY MORNING   aspirin EC 81 MG tablet Take 81  mg by mouth daily. Swallow whole.   Blood Glucose Monitoring Suppl (ACCU-CHEK GUIDE ME) w/Device KIT 1 Piece by Does not apply route as directed.   Continuous Blood Gluc Receiver (FREESTYLE LIBRE 2 READER) DEVI As directed   Continuous Blood Gluc Sensor (FREESTYLE LIBRE 2 SENSOR) MISC 1 Piece by Does not apply route every 14 (fourteen) days.   diphenhydrAMINE (BENADRYL) 25 MG tablet Take 25 mg by mouth daily as needed for allergies.   donepezil (ARICEPT) 5 MG tablet donepezil 5 mg tablet  TAKE 1 TABLET BY MOUTH EVERYDAY AT BEDTIME   fluticasone (FLONASE) 50 MCG/ACT nasal spray Place 2 sprays daily as needed into both nostrils for allergies or rhinitis.   gabapentin  (NEURONTIN) 600 MG tablet Take 600-1,200 mg by mouth See admin instructions. Take 1200 mg in the morning and 600 mg at night   hydrochlorothiazide (HYDRODIURIL) 25 MG tablet TAKE 1 TABLET BY MOUTH EVERY DAY   insulin NPH-regular Human (NOVOLIN 70/30) (70-30) 100 UNIT/ML injection Inject 20 units with breakfast, 20 units with supper when premeal blood glucose readings above 90 mg per DL .   Insulin Syringe-Needle U-100 (BD VEO INSULIN SYRINGE U/F) 31G X 15/64" 1 ML MISC USE TWICE A DAY  To inject insulin   Lancets (ONETOUCH DELICA PLUS [id number]) MISC USE TO TEST BLOOD SUGAR TWICE DAILY.   metoprolol tartrate (LOPRESSOR) 25 MG tablet Take 25 mg by mouth 2 (two) times daily.   omeprazole (PRILOSEC) 20 MG capsule Take 1 capsule (20 mg total) by mouth daily. (Patient taking differently: Take 20 mg by mouth daily as needed.)   ONETOUCH ULTRA test strip USE TO TEST BLOOD SUGAR TWICE DAILY   Propylene Glycol (SYSTANE BALANCE OP) Apply 1 drop as needed to eye (irritation).   Rivaroxaban (XARELTO) 15 MG TABS tablet Take 15 mg by mouth daily.    [DISCONTINUED] insulin NPH-regular Human (NOVOLIN 70/30) (70-30) 100 UNIT/ML injection Inject 25 units with breakfast, 25 units with supper when premeal blood glucose readings above 90 mg per DL . (Patient taking differently: Inject 15-25 units with breakfast, 15-25 units with supper when premeal blood glucose readings above 90 mg per DL .)   [DISCONTINUED] simvastatin (ZOCOR) 40 MG tablet Takes 1/2 tablet daily bedtime   No facility-administered encounter medications on file as of 09/14/2021.   ALLERGIES: Allergies  Allergen Reactions   Ace Inhibitors Swelling    REACTION: angio edema (swelling of lips)   Allopurinol    Metformin And Related     Itching in higher doses    VACCINATION STATUS: Immunization History  Administered Date(s) Administered   Influenza Split 11/27/2011, 12/30/2012, 01/01/2014, 03/13/2017   Influenza Whole 01/27/2009, 11/30/2009,  11/17/2010   Influenza, High Dose Seasonal PF 12/21/2015, 01/04/2018   Pneumococcal Conjugate-13 02/04/2018   Pneumococcal Polysaccharide-23 12/02/2008    Diabetes He presents for his follow-up diabetic visit. He has type 2 diabetes mellitus. Onset time: Diagnosed approximately  at age 28. His disease course has been stable. Pertinent negatives for hypoglycemia include no confusion, headaches, pallor or seizures. Pertinent negatives for diabetes include no chest pain, no fatigue, no polydipsia, no polyphagia, no polyuria and no weakness. Symptoms are stable. Diabetic complications include nephropathy. Risk factors for coronary artery disease include diabetes mellitus, dyslipidemia, male sex, hypertension and sedentary lifestyle. Current diabetic treatment includes insulin injections. His weight is fluctuating minimally. He is following a generally unhealthy diet. Meal planning includes ADA exchanges. He participates in exercise intermittently. His home blood glucose trend is fluctuating minimally.  His breakfast blood glucose range is generally 130-140 mg/dl. His overall blood glucose range is 140-180 mg/dl. (He presents with stable glycemic profile, averaging 194/30 days.  His point-of-care A1c is 7.5%, unchanged from his last visit A1c.  He did not document or report hypoglycemia.   ) An ACE inhibitor/angiotensin II receptor blocker is contraindicated.  Hypertension This is a chronic problem. The current episode started more than 1 year ago. The problem is controlled. Pertinent negatives include no chest pain, headaches, neck pain, palpitations or shortness of breath. Risk factors for coronary artery disease include dyslipidemia, diabetes mellitus, male gender and sedentary lifestyle. Past treatments include calcium channel blockers.  Hyperlipidemia This is a chronic problem. The current episode started more than 1 year ago. The problem is uncontrolled. Exacerbating diseases include diabetes. Pertinent  negatives include no chest pain, myalgias or shortness of breath. Current antihyperlipidemic treatment includes bile acid squestrants. Risk factors for coronary artery disease include dyslipidemia and diabetes mellitus.    Review of systems Limited as above.   Objective:    BP (!) 122/58   Pulse (!) 52   Ht '6\' 1"'  (1.854 m)   Wt 201 lb (91.2 kg)   BMI 26.52 kg/m   Wt Readings from Last 3 Encounters:  09/14/21 201 lb (91.2 kg)  03/15/21 209 lb (94.8 kg)  09/03/20 209 lb 3.2 oz (94.9 kg)      Recent Results (from the past 2160 hour(s))  HgB A1c     Status: Abnormal   Collection Time: 09/14/21 11:56 AM  Result Value Ref Range   Hemoglobin A1C     HbA1c POC (<> result, manual entry)     HbA1c, POC (prediabetic range)     HbA1c, POC (controlled diabetic range) 7.5 (A) 0.0 - 7.0 %   Lipid Panel     Component Value Date/Time   CHOL 167 02/25/2021 0833   TRIG 106 02/25/2021 0833   HDL 40 02/25/2021 0833   CHOLHDL 4.2 02/25/2021 0833   CHOLHDL 4.8 11/20/2017 0745   VLDL 28 03/17/2016 0714   LDLCALC 108 (H) 02/25/2021 0833   LDLCALC 152 (H) 11/20/2017 0745   LDLDIRECT 147.5 06/10/2012 0930     Assessment & Plan:   1. Type 2 diabetes mellitus with stage 2 chronic kidney disease  He presents with stable glycemic profile, averaging 194/30 days.  His point-of-care A1c is 7.5%, unchanged from his last visit A1c.  He did not document or report hypoglycemia.     Patient is advised to stick to the recommended insulin dose and a routine mealtimes to eat 3 meals  a day and avoid unnecessary snacks ( to snack only to correct hypoglycemia).  - he acknowledges that there is a room for improvement in his food and drink choices. - Suggestion is made for him to avoid simple carbohydrates  from his diet including Cakes, Sweet Desserts, Ice Cream, Soda (diet and regular), Sweet Tea, Candies, Chips, Cookies, Store Bought Juices, Alcohol in Excess of  1-2 drinks a day, Artificial Sweeteners,   Coffee Creamer, and "Sugar-free" Products, Lemonade. This will help patient to have more stable blood glucose profile and potentially avoid unintended weight gain.  -He worries about hypoglycemia, and he is at risk for hypoglycemia from inadvertent use of insulin.  Priority in his care would be to avoid hypoglycemia.    -He is advised to lower his 70/30 Novolin to 20 units with breakfast and 20 units with supper only when his Premeal blood glucose readings are  above 90 mg/day.    - He is advised to continue glucose monitoring at least 2 times daily-before breakfast and at supper and anytime as needed. - Patient is instructed to call back with extremes of blood glucose readings  less than 70 or greater than 300 mg/dl. -He is not a candidate for Metformin, SGLT2 inhibitors, nor incretin therapy.   -I discussed and prescribed the freestyle libre device for him.   2. Mixed hyperlipidemia - His repeat lipid panel showed significant improvement in his LDL at 92 from 166.  He is tolerating Crestor,  advised to continue Crestor 20 mg p.o. nightly.     - He is advised to continue with omega-3 fatty acids ,avoid butter and fried food and exercise regularly.  3. Essential hypertension  -His blood pressure is controlled to target   He is currently on amlodipine 10 mg p.o. daily, metoprolol 50 mg daily.  I discussed and added hydrochlorothiazide 25 mg p.o. daily at breakfast for better control of hypertension.    4) vitamin D deficiency -He is on ongoing supplement with vitamin D3  5000 units daily for the next 90 days.   5) multinodular goiter: -She was recently seen for work-up of multinodular goiter.  It was observed that he has had stable euthyroid multinodular goiter documented for the last 7 years.  He will not need biopsy or surgical intervention for this at this time.  7 years of stability makes it unnecessary to do another thyroid imaging.  His recent thyroid function test and within normal  state, he will need yearly thyroid function test.  His recent screening ABI was negative for PAD on June 03, 2020.  His neck study will be repeated in March 2027, or sooner if needed.   He is advised to continue close follow-up with his PCP Dr. Grier Mitts.   I spent 35 minutes in the care of the patient today including review of labs from [place], Lipids, Thyroid Function, Hematology (current and previous including abstractions from other facilities); face-to-face time discussing  his blood glucose readings/logs, discussing hypoglycemia and hyperglycemia episodes and symptoms, medications doses, his options of short and long term treatment based on the latest standards of care / guidelines;  discussion about incorporating lifestyle medicine;  and documenting the encounter. Risk reduction counseling performed per USPSTF guidelines to reduce obesity and cardiovascular risk factors.     Please refer to Patient Instructions for Blood Glucose Monitoring and Insulin/Medications Dosing Guide"  in media tab for additional information. Please  also refer to " Patient Self Inventory" in the Media  tab for reviewed elements of pertinent patient history.  Hector Rose participated in the discussions, expressed understanding, and voiced agreement with the above plans.  All questions were answered to his satisfaction. he is encouraged to contact clinic should he have any questions or concerns prior to his return visit.     Follow up plan: Return in about 4 months (around 01/15/2022) for F/U with Pre-visit Labs, Meter/CGM/Logs, A1c here, Urine MA - NV.  Glade Lloyd, MD Phone: [phone]  Fax: [phone]  -  This note was partially dictated with voice recognition software. Similar sounding words can be transcribed inadequately or may not  be corrected upon review.  09/14/2021, 6:03 PM

## 2021-09-14 NOTE — Patient Instructions (Signed)

## 2021-09-22 DIAGNOSIS — E663 Overweight: Secondary | ICD-10-CM | POA: Diagnosis not present

## 2021-09-22 DIAGNOSIS — Z6827 Body mass index (BMI) 27.0-27.9, adult: Secondary | ICD-10-CM | POA: Diagnosis not present

## 2021-09-22 DIAGNOSIS — R040 Epistaxis: Secondary | ICD-10-CM | POA: Diagnosis not present

## 2021-09-27 DIAGNOSIS — R300 Dysuria: Secondary | ICD-10-CM | POA: Diagnosis not present

## 2021-09-27 DIAGNOSIS — E6609 Other obesity due to excess calories: Secondary | ICD-10-CM | POA: Diagnosis not present

## 2021-09-27 DIAGNOSIS — Z6828 Body mass index (BMI) 28.0-28.9, adult: Secondary | ICD-10-CM | POA: Diagnosis not present

## 2021-10-14 ENCOUNTER — Telehealth: Payer: Self-pay | Admitting: "Endocrinology

## 2021-10-14 DIAGNOSIS — E1122 Type 2 diabetes mellitus with diabetic chronic kidney disease: Secondary | ICD-10-CM

## 2021-10-14 DIAGNOSIS — N1831 Chronic kidney disease, stage 3a: Secondary | ICD-10-CM

## 2021-10-14 MED ORDER — FREESTYLE LIBRE 2 READER DEVI: 0 refills | Status: AC

## 2021-10-14 MED ORDER — FREESTYLE LIBRE 2 SENSOR MISC: 1.0000 | 2 refills | Status: DC

## 2021-10-14 NOTE — Telephone Encounter (Signed)
Rx's sent to [facility].

## 2021-10-14 NOTE — Telephone Encounter (Signed)
New message   Patient insurance company is waiting prescription  1. Which medications need to be refilled? (please list name of each medication and dose if known)   Continuous Blood Gluc Receiver (FREESTYLE LIBRE 2 READER) DEVI  Continuous Blood Gluc Sensor (FREESTYLE LIBRE 2 SENSOR) MISC  2. Which pharmacy/location (including street and city if local pharmacy) [facility] [place]   3. Do they need a 30 day or 90 day supply?

## 2021-10-17 DIAGNOSIS — E119 Type 2 diabetes mellitus without complications: Secondary | ICD-10-CM | POA: Diagnosis not present

## 2021-10-17 DIAGNOSIS — H2512 Age-related nuclear cataract, left eye: Secondary | ICD-10-CM | POA: Diagnosis not present

## 2021-10-17 DIAGNOSIS — H35373 Puckering of macula, bilateral: Secondary | ICD-10-CM | POA: Diagnosis not present

## 2021-10-17 DIAGNOSIS — H01002 Unspecified blepharitis right lower eyelid: Secondary | ICD-10-CM | POA: Diagnosis not present

## 2021-10-17 DIAGNOSIS — H01001 Unspecified blepharitis right upper eyelid: Secondary | ICD-10-CM | POA: Diagnosis not present

## 2021-10-20 DIAGNOSIS — H2512 Age-related nuclear cataract, left eye: Secondary | ICD-10-CM | POA: Diagnosis not present

## 2021-10-21 ENCOUNTER — Telehealth: Payer: Self-pay | Admitting: Orthopedic Surgery

## 2021-10-21 NOTE — Telephone Encounter (Signed)
Patient called to inquire about scheduling an appointment for his left knee. Discussed scheduling, and inquired as per protocol, if any previous orthopaedic treatment for this knee problem, and patient relayed he has seen Dr Gladstone Lighter at [facility].  Chart notes indicate patient was seen at [facility] 10/22/2019. Due to 2nd opinion protocol, which we discussed further, patient said he will go ahead and call back to [facility] to request to see Dr Gladstone Lighter.

## 2021-10-23 ENCOUNTER — Other Ambulatory Visit: Payer: Self-pay | Admitting: "Endocrinology

## 2021-10-25 ENCOUNTER — Encounter (HOSPITAL_COMMUNITY)
Admission: RE | Admit: 2021-10-25 | Discharge: 2021-10-25 | Disposition: A | Discharge status: Home / Self Care | Payer: HMO | Source: Ambulatory Visit | Attending: Ophthalmology | Admitting: Ophthalmology

## 2021-10-26 NOTE — H&P (Signed)
Surgical History & Physical  Patient Name: Hector Rose DOB: 12/31/1940  Surgery: Cataract extraction with intraocular lens implant phacoemulsification; Left Eye  Surgeon: Baruch Goldmann MD Surgery Date:  10-28-21 Pre-Op Date:  10-20-21  HPI: A 58 Yr. old male patient 1. 1. The patient is here for a cataract evaluation of the left eye, referred from [place]. Patient has already had cataract surgery on the OD. The patient complains of difficulty reading small print on medicine bottles/labels, difficulty seeing steps, and difficulty driving at night due to halos/glare. This is negatively affecting the patient's quality of life and the patient is unable to function adequately in life with the current level of vision. HPI was performed by Baruch Goldmann .  Medical History:  no known family hx of armd/glaucoma Diabetes - DM Type 2 High Blood Pressure  Review of Systems Negative Allergic/Immunologic Negative Cardiovascular Negative Constitutional Negative Ear, Nose, Mouth & Throat Negative Endocrine Negative Eyes Negative Gastrointestinal Negative Genitourinary Negative Hemotologic/Lymphatic Negative Integumentary Negative Musculoskeletal Negative Neurological Negative Psychiatry Negative Respiratory  Social   Never smoked   Medication Artificial Tears,  Hydrochlorothiazide, Novolin 70/30, Donepezil, Omeprazole, Metoprolol, Gabapentin,   Sx/Procedures Cataract Surgery,  Kidney Removal,   Drug Allergies  ACE Inhibbitors,   History & Physical: Heent: Cataract, left eye NECK: supple without bruits LUNGS: lungs clear to auscultation CV: regular rate and rhythm Abdomen: soft and non-tender Impression & Plan: Assessment: 1.  NUCLEAR SCLEROSIS AGE RELATED; Left Eye (H25.12) 2.  Diabetes Type 2 No retinopathy (E11.9) 3.  BLEPHARITIS; Right Upper Lid, Right Lower Lid, Left Upper Lid, Left Lower Lid (H01.001, H01.002,H01.004,H01.005) 4.  ARCUS SENILIS; Both Eyes (H18.413) 5.   INTRAOCULAR LENS IOL (Z96.1) 6.  Epiretinal Membrane; Both Eyes (H35.373)  Plan: 1.  Cataract accounts for the patient's decreased vision. This visual impairment is not correctable with a tolerable change in glasses or contact lenses. Cataract surgery with an implantation of a new lens should significantly improve the visual and functional status of the patient. Discussed all risks, benefits, alternatives, and potential complications. Discussed the procedures and recovery. Patient desires to have surgery. A-scan ordered and performed today for intra-ocular lens calculations. The surgery will be performed in order to improve vision for driving, reading, and for eye examinations. Recommend phacoemulsification with intra-ocular lens. Recommend Dextenza for post-operative pain and inflammation. Left Eye. Dilates poorly - shugarcaine by protocol. Malyugin Ring. Omidira.  2.  Stressed importance of blood sugar and blood pressure control, and also yearly eye examinations. Discussed the need for ongoing proactive ocular exams and treatment, hopefully before visual symptoms develop.  3.  Recommend regular lid cleaning  4.  Discussed significance of finding  5.  de-centered nasally with movement of IOL. Monitor.  6.  Discussed diagnosis in detail with patient. No treatment is required at this time. Will continue to observe condition and or symptoms.

## 2021-10-28 ENCOUNTER — Ambulatory Visit (HOSPITAL_COMMUNITY)
Admission: RE | Admit: 2021-10-28 | Discharge: 2021-10-28 | Disposition: A | Discharge status: Home / Self Care | Payer: HMO | Attending: Ophthalmology | Admitting: Ophthalmology

## 2021-10-28 ENCOUNTER — Encounter (HOSPITAL_COMMUNITY)
Admission: RE | Disposition: A | Discharge status: Home / Self Care | Payer: Self-pay | Source: Home / Self Care | Attending: Ophthalmology

## 2021-10-28 ENCOUNTER — Ambulatory Visit (HOSPITAL_BASED_OUTPATIENT_CLINIC_OR_DEPARTMENT_OTHER): Payer: HMO | Admitting: Anesthesiology

## 2021-10-28 ENCOUNTER — Ambulatory Visit (HOSPITAL_COMMUNITY): Payer: HMO | Admitting: Anesthesiology

## 2021-10-28 ENCOUNTER — Encounter (HOSPITAL_COMMUNITY): Payer: Self-pay | Admitting: Ophthalmology

## 2021-10-28 DIAGNOSIS — Z7984 Long term (current) use of oral hypoglycemic drugs: Secondary | ICD-10-CM | POA: Diagnosis not present

## 2021-10-28 DIAGNOSIS — Z79899 Other long term (current) drug therapy: Secondary | ICD-10-CM | POA: Insufficient documentation

## 2021-10-28 DIAGNOSIS — E119 Type 2 diabetes mellitus without complications: Secondary | ICD-10-CM | POA: Insufficient documentation

## 2021-10-28 DIAGNOSIS — M1990 Unspecified osteoarthritis, unspecified site: Secondary | ICD-10-CM | POA: Diagnosis not present

## 2021-10-28 DIAGNOSIS — Z86718 Personal history of other venous thrombosis and embolism: Secondary | ICD-10-CM | POA: Insufficient documentation

## 2021-10-28 DIAGNOSIS — K219 Gastro-esophageal reflux disease without esophagitis: Secondary | ICD-10-CM | POA: Diagnosis not present

## 2021-10-28 DIAGNOSIS — H2512 Age-related nuclear cataract, left eye: Secondary | ICD-10-CM

## 2021-10-28 DIAGNOSIS — E1136 Type 2 diabetes mellitus with diabetic cataract: Secondary | ICD-10-CM | POA: Insufficient documentation

## 2021-10-28 DIAGNOSIS — J45909 Unspecified asthma, uncomplicated: Secondary | ICD-10-CM | POA: Insufficient documentation

## 2021-10-28 DIAGNOSIS — I10 Essential (primary) hypertension: Secondary | ICD-10-CM | POA: Diagnosis not present

## 2021-10-28 DIAGNOSIS — Z794 Long term (current) use of insulin: Secondary | ICD-10-CM | POA: Insufficient documentation

## 2021-10-28 DIAGNOSIS — H269 Unspecified cataract: Secondary | ICD-10-CM

## 2021-10-28 HISTORY — PX: CATARACT EXTRACTION W/PHACO: SHX586

## 2021-10-28 LAB — GLUCOSE, CAPILLARY: Glucose-Capillary: 85 mg/dL (ref 70–99)

## 2021-10-28 SURGERY — PHACOEMULSIFICATION, CATARACT, WITH IOL INSERTION
Anesthesia: Monitor Anesthesia Care | Site: Eye | Laterality: Left

## 2021-10-28 MED ORDER — TROPICAMIDE 1 % OP SOLN
1.0000 [drp] | OPHTHALMIC | Status: AC | PRN
  Administered 2021-10-28 (×3): 1 [drp] via OPHTHALMIC

## 2021-10-28 MED ORDER — LIDOCAINE HCL (PF) 1 % IJ SOLN
INTRAOCULAR | Status: DC | PRN
  Administered 2021-10-28: 1 mL via OPHTHALMIC

## 2021-10-28 MED ORDER — TETRACAINE HCL 0.5 % OP SOLN
1.0000 [drp] | OPHTHALMIC | Status: AC | PRN
  Administered 2021-10-28 (×3): 1 [drp] via OPHTHALMIC

## 2021-10-28 MED ORDER — FENTANYL CITRATE (PF) 100 MCG/2ML IJ SOLN
INTRAMUSCULAR | Status: AC
  Filled 2021-10-28: qty 2

## 2021-10-28 MED ORDER — SODIUM HYALURONATE 23MG/ML IO SOSY
PREFILLED_SYRINGE | INTRAOCULAR | Status: DC | PRN
  Administered 2021-10-28: 0.6 mL via INTRAOCULAR

## 2021-10-28 MED ORDER — FENTANYL CITRATE (PF) 100 MCG/2ML IJ SOLN
INTRAMUSCULAR | Status: DC | PRN
  Administered 2021-10-28: 50 ug via INTRAVENOUS

## 2021-10-28 MED ORDER — POVIDONE-IODINE 5 % OP SOLN
OPHTHALMIC | Status: DC | PRN
  Administered 2021-10-28: 1 via OPHTHALMIC

## 2021-10-28 MED ORDER — SODIUM HYALURONATE 10 MG/ML IO SOLUTION
PREFILLED_SYRINGE | INTRAOCULAR | Status: DC | PRN
  Administered 2021-10-28: 0.85 mL via INTRAOCULAR

## 2021-10-28 MED ORDER — PHENYLEPHRINE-KETOROLAC 1-0.3 % IO SOLN
INTRAOCULAR | Status: DC | PRN
  Administered 2021-10-28: 500 mL via OPHTHALMIC

## 2021-10-28 MED ORDER — DEXTROSE 50 % IV SOLN
INTRAVENOUS | Status: AC
  Filled 2021-10-28: qty 50

## 2021-10-28 MED ORDER — DEXTROSE 50 % IV SOLN
25.0000 mL | Freq: Once | INTRAVENOUS | Status: AC
  Administered 2021-10-28: 25 mL via INTRAVENOUS

## 2021-10-28 MED ORDER — STERILE WATER FOR IRRIGATION IR SOLN
Status: DC | PRN
  Administered 2021-10-28: 250 mL

## 2021-10-28 MED ORDER — NEOMYCIN-POLYMYXIN-DEXAMETH 3.5-10000-0.1 OP SUSP
OPHTHALMIC | Status: DC | PRN
  Administered 2021-10-28: 2 [drp] via OPHTHALMIC

## 2021-10-28 MED ORDER — LIDOCAINE HCL 3.5 % OP GEL
1.0000 | Freq: Once | OPHTHALMIC | Status: AC
  Administered 2021-10-28: 1 via OPHTHALMIC

## 2021-10-28 MED ORDER — PHENYLEPHRINE-KETOROLAC 1-0.3 % IO SOLN
INTRAOCULAR | Status: AC
  Filled 2021-10-28: qty 4

## 2021-10-28 MED ORDER — PHENYLEPHRINE HCL 2.5 % OP SOLN
1.0000 [drp] | OPHTHALMIC | Status: AC | PRN
  Administered 2021-10-28 (×3): 1 [drp] via OPHTHALMIC

## 2021-10-28 MED ORDER — BSS IO SOLN
INTRAOCULAR | Status: DC | PRN
  Administered 2021-10-28: 15 mL via INTRAOCULAR

## 2021-10-28 SURGICAL SUPPLY — 13 items
CATARACT SUITE SIGHTPATH (MISCELLANEOUS) ×1 IMPLANT
CLOTH BEACON ORANGE TIMEOUT ST (SAFETY) ×1 IMPLANT
EYE SHIELD UNIVERSAL CLEAR (GAUZE/BANDAGES/DRESSINGS) IMPLANT
FEE CATARACT SUITE SIGHTPATH (MISCELLANEOUS) ×1 IMPLANT
GLOVE BIOGEL PI IND STRL 7.0 (GLOVE) ×2 IMPLANT
GLOVE BIOGEL PI INDICATOR 7.0 (GLOVE) ×2
LENS IOL RAYNER 18.5 (Intraocular Lens) ×1 IMPLANT
LENS IOL RAYONE EMV 18.5 (Intraocular Lens) IMPLANT
PAD ARMBOARD 7.5X6 YLW CONV (MISCELLANEOUS) ×1 IMPLANT
SYR TB 1ML LL NO SAFETY (SYRINGE) ×1 IMPLANT
TAPE SURG TRANSPORE 1 IN (GAUZE/BANDAGES/DRESSINGS) IMPLANT
TAPE SURGICAL TRANSPORE 1 IN (GAUZE/BANDAGES/DRESSINGS) ×1
WATER STERILE IRR 250ML POUR (IV SOLUTION) ×1 IMPLANT

## 2021-10-28 NOTE — Anesthesia Procedure Notes (Signed)
Procedure Name: MAC Date/Time: 10/28/2021 1:22 PM  Performed by: Lieutenant Diego, CRNAPre-anesthesia Checklist: Patient identified, Emergency Drugs available, Suction available, Patient being monitored and Timeout performed Patient Re-evaluated:Patient Re-evaluated prior to induction Oxygen Delivery Method: Nasal cannula

## 2021-10-28 NOTE — Transfer of Care (Signed)
Immediate Anesthesia Transfer of Care Note  Patient: Hector Rose  Procedure(s) Performed: CATARACT EXTRACTION PHACO AND INTRAOCULAR LENS PLACEMENT (IOC) (Left: Eye)  Patient Location: PACU  Anesthesia Type:MAC  Level of Consciousness: awake  Airway & Oxygen Therapy: Patient Spontanous Breathing  Post-op Assessment: Report given to RN and Post -op Vital signs reviewed and stable  Post vital signs: Reviewed and stable  Last Vitals:  Vitals Value Taken Time  BP    Temp    Pulse    Resp    SpO2      Last Pain:  Vitals:   10/28/21 1249  TempSrc: Oral  PainSc: 0-No pain      Patients Stated Pain Goal: 8 (44/73/95 [id number])  Complications: No notable events documented.

## 2021-10-28 NOTE — Discharge Instructions (Signed)
Please discharge patient when stable, will follow up today with Dr. Takeisha Cianci at the [facility] [place] office immediately following discharge.  Leave shield in place until visit.  All paperwork with discharge instructions will be given at the office.  [facility] [place] Address:  [address]  [place], [place] [address]  

## 2021-10-28 NOTE — Anesthesia Preprocedure Evaluation (Signed)
Anesthesia Evaluation  Patient identified by MRN, date of birth, ID band Patient awake    Reviewed: Allergy & Precautions, NPO status , Patient's Chart, lab work & pertinent test results, reviewed documented beta blocker date and time   History of Anesthesia Complications (+) DIFFICULT AIRWAY and history of anesthetic complications  Airway Mallampati: III  TM Distance: >3 FB Neck ROM: Full    Dental  (+) Dental Advisory Given, Upper Dentures   Pulmonary asthma ,    Pulmonary exam normal breath sounds clear to auscultation       Cardiovascular hypertension, Pt. on medications and Pt. on home beta blockers + DVT  Normal cardiovascular exam Rhythm:Regular Rate:Normal     Neuro/Psych  Neuromuscular disease negative psych ROS   GI/Hepatic Neg liver ROS, GERD  Medicated and Controlled,  Endo/Other  diabetes, Well Controlled, Type 2, Oral Hypoglycemic Agents, Insulin Dependent  Renal/GU Renal disease  negative genitourinary   Musculoskeletal  (+) Arthritis , Osteoarthritis,    Abdominal   Peds negative pediatric ROS (+)  Hematology negative hematology ROS (+)   Anesthesia Other Findings   Reproductive/Obstetrics negative OB ROS                            Anesthesia Physical Anesthesia Plan  ASA: 3  Anesthesia Plan: MAC   Post-op Pain Management: Minimal or no pain anticipated   Induction: Intravenous  PONV Risk Score and Plan:   Airway Management Planned: Nasal Cannula and Natural Airway  Additional Equipment:   Intra-op Plan:   Post-operative Plan:   Informed Consent: I have reviewed the patients History and Physical, chart, labs and discussed the procedure including the risks, benefits and alternatives for the proposed anesthesia with the patient or authorized representative who has indicated his/her understanding and acceptance.     Dental advisory given  Plan Discussed  with: CRNA and Surgeon  Anesthesia Plan Comments:         Anesthesia Quick Evaluation

## 2021-10-28 NOTE — Interval H&P Note (Signed)
History and Physical Interval Note:  10/28/2021 1:08 PM  Hector Rose  has presented today for surgery, with the diagnosis of nuclear sclerosis age related; left.  The various methods of treatment have been discussed with the patient and family. After consideration of risks, benefits and other options for treatment, the patient has consented to  Procedure(s) with comments: CATARACT EXTRACTION PHACO AND INTRAOCULAR LENS PLACEMENT ([place]) (Left) - left as a surgical intervention.  The patient's history has been reviewed, patient examined, no change in status, stable for surgery.  I have reviewed the patient's chart and labs.  Questions were answered to the patient's satisfaction.     Baruch Goldmann

## 2021-10-28 NOTE — Anesthesia Postprocedure Evaluation (Signed)
Anesthesia Post Note  Patient: Hector Rose  Procedure(s) Performed: CATARACT EXTRACTION PHACO AND INTRAOCULAR LENS PLACEMENT (IOC) (Left: Eye)  Patient location during evaluation: Phase II Anesthesia Type: MAC Level of consciousness: awake and alert and oriented Pain management: pain level controlled Vital Signs Assessment: post-procedure vital signs reviewed and stable Respiratory status: spontaneous breathing, nonlabored ventilation and respiratory function stable Cardiovascular status: stable and blood pressure returned to baseline Postop Assessment: no apparent nausea or vomiting Anesthetic complications: no   No notable events documented.   Last Vitals:  Vitals:   10/28/21 1249 10/28/21 1339  BP: (!) 143/64 (!) 171/74  Pulse:  (!) 54  Resp: 19 13  Temp: 36.7 C 36.6 C  SpO2: 100% 100%    Last Pain:  Vitals:   10/28/21 1339  TempSrc: Oral  PainSc: 0-No pain                 Abelina Ketron C Katlen Seyer

## 2021-10-28 NOTE — Op Note (Signed)
Date of procedure: 10/28/21  Pre-operative diagnosis: Visually significant age-related nuclear cataract, Left Eye (H25.12)  Post-operative diagnosis: Visually significant age-related nuclear cataract, Left Eye  Procedure: Removal of cataract via phacoemulsification and insertion of intra-ocular lens Rayner [id number] +18.5D into the capsular bag of the Left Eye  Attending surgeon: Gerda Diss. Tiya Schrupp, MD, MA  Anesthesia: MAC, Topical Akten  Complications: None  Estimated Blood Loss: <7m (minimal)  Specimens: None  Implants: As above  Indications:  Visually significant age-related cataract, Left Eye  Procedure:  The patient was seen and identified in the pre-operative area. The operative eye was identified and dilated.  The operative eye was marked.  Topical anesthesia was administered to the operative eye.     The patient was then to the operative suite and placed in the supine position.  A timeout was performed confirming the patient, procedure to be performed, and all other relevant information.   The patient's face was prepped and draped in the usual fashion for intra-ocular surgery.  A lid speculum was placed into the operative eye and the surgical microscope moved into place and focused.  An inferotemporal paracentesis was created using a 20 gauge paracentesis blade.  Shugarcaine was injected into the anterior chamber.  Viscoelastic was injected into the anterior chamber.  A temporal clear-corneal main wound incision was created using a 2.455mmicrokeratome.  A continuous curvilinear capsulorrhexis was initiated using an irrigating cystitome and completed using capsulorrhexis forceps.  Hydrodissection and hydrodeliniation were performed.  Viscoelastic was injected into the anterior chamber.  A phacoemulsification handpiece and a chopper as a second instrument were used to remove the nucleus and epinucleus. The irrigation/aspiration handpiece was used to remove any remaining cortical material.    The capsular bag was reinflated with viscoelastic, checked, and found to be intact.  The intraocular lens was inserted into the capsular bag.  The irrigation/aspiration handpiece was used to remove any remaining viscoelastic.  The clear corneal wound and paracentesis wounds were then hydrated and checked with Weck-Cels to be watertight.  The lid-speculum and drape was removed, and the patient's face was cleaned with a wet and dry 4x4.  Maxitrol was instilled in the eye. A clear shield was taped over the eye. The patient was taken to the post-operative care unit in good condition, having tolerated the procedure well.  Post-Op Instructions: [facility] and will receive all other orders and instructions.

## 2021-11-02 ENCOUNTER — Encounter (HOSPITAL_COMMUNITY): Payer: Self-pay | Admitting: Ophthalmology

## 2021-11-10 DIAGNOSIS — E782 Mixed hyperlipidemia: Secondary | ICD-10-CM | POA: Diagnosis not present

## 2021-11-10 DIAGNOSIS — M25562 Pain in left knee: Secondary | ICD-10-CM | POA: Diagnosis not present

## 2021-11-10 DIAGNOSIS — I10 Essential (primary) hypertension: Secondary | ICD-10-CM | POA: Diagnosis not present

## 2021-11-10 DIAGNOSIS — M25532 Pain in left wrist: Secondary | ICD-10-CM | POA: Diagnosis not present

## 2021-11-10 DIAGNOSIS — M25531 Pain in right wrist: Secondary | ICD-10-CM | POA: Diagnosis not present

## 2021-11-10 DIAGNOSIS — M25561 Pain in right knee: Secondary | ICD-10-CM | POA: Diagnosis not present

## 2021-11-10 DIAGNOSIS — E1122 Type 2 diabetes mellitus with diabetic chronic kidney disease: Secondary | ICD-10-CM | POA: Diagnosis not present

## 2021-11-21 ENCOUNTER — Ambulatory Visit: Payer: HMO

## 2021-11-21 NOTE — Progress Notes (Signed)
Pt was seen for a nurse visit to start his Libre 2 CGM system. Libre 2 app was added to pt's phone, instructions and demonstration of how to use the app and his phone to read sensor given. Instructions and demonstration of how to apply [place] 2 sensor given. Libre 2 sensor applied to inner upper L arm without difficulty. Answered all pt questions. Understanding voiced per pt. Advised pt if he had any other questions or concerns to contact our office. Understanding voiced.

## 2021-11-28 ENCOUNTER — Other Ambulatory Visit (HOSPITAL_COMMUNITY): Payer: Self-pay | Admitting: Family Medicine

## 2021-11-28 ENCOUNTER — Ambulatory Visit (HOSPITAL_COMMUNITY)
Admission: RE | Admit: 2021-11-28 | Discharge: 2021-11-28 | Disposition: A | Discharge status: Home / Self Care | Payer: HMO | Source: Ambulatory Visit | Attending: Family Medicine | Admitting: Family Medicine

## 2021-11-28 DIAGNOSIS — M25532 Pain in left wrist: Secondary | ICD-10-CM | POA: Diagnosis not present

## 2021-11-28 DIAGNOSIS — E663 Overweight: Secondary | ICD-10-CM | POA: Diagnosis not present

## 2021-11-28 DIAGNOSIS — M19032 Primary osteoarthritis, left wrist: Secondary | ICD-10-CM | POA: Diagnosis not present

## 2021-11-28 DIAGNOSIS — M109 Gout, unspecified: Secondary | ICD-10-CM | POA: Diagnosis not present

## 2021-11-28 DIAGNOSIS — Z6827 Body mass index (BMI) 27.0-27.9, adult: Secondary | ICD-10-CM | POA: Diagnosis not present

## 2021-11-28 DIAGNOSIS — M1812 Unilateral primary osteoarthritis of first carpometacarpal joint, left hand: Secondary | ICD-10-CM | POA: Diagnosis not present

## 2021-12-05 ENCOUNTER — Ambulatory Visit: Payer: HMO

## 2021-12-08 ENCOUNTER — Other Ambulatory Visit: Payer: Self-pay | Admitting: "Endocrinology

## 2021-12-10 DIAGNOSIS — E1122 Type 2 diabetes mellitus with diabetic chronic kidney disease: Secondary | ICD-10-CM | POA: Diagnosis not present

## 2021-12-10 DIAGNOSIS — I10 Essential (primary) hypertension: Secondary | ICD-10-CM | POA: Diagnosis not present

## 2021-12-10 DIAGNOSIS — E782 Mixed hyperlipidemia: Secondary | ICD-10-CM | POA: Diagnosis not present

## 2021-12-14 DIAGNOSIS — E663 Overweight: Secondary | ICD-10-CM | POA: Diagnosis not present

## 2021-12-14 DIAGNOSIS — Z23 Encounter for immunization: Secondary | ICD-10-CM | POA: Diagnosis not present

## 2021-12-14 DIAGNOSIS — Z0001 Encounter for general adult medical examination with abnormal findings: Secondary | ICD-10-CM | POA: Diagnosis not present

## 2021-12-14 DIAGNOSIS — E1122 Type 2 diabetes mellitus with diabetic chronic kidney disease: Secondary | ICD-10-CM | POA: Diagnosis not present

## 2021-12-14 DIAGNOSIS — Z6827 Body mass index (BMI) 27.0-27.9, adult: Secondary | ICD-10-CM | POA: Diagnosis not present

## 2021-12-14 DIAGNOSIS — E782 Mixed hyperlipidemia: Secondary | ICD-10-CM | POA: Diagnosis not present

## 2021-12-14 DIAGNOSIS — Z1331 Encounter for screening for depression: Secondary | ICD-10-CM | POA: Diagnosis not present

## 2021-12-14 DIAGNOSIS — C641 Malignant neoplasm of right kidney, except renal pelvis: Secondary | ICD-10-CM | POA: Diagnosis not present

## 2022-01-13 LAB — LIPID PANEL
Chol/HDL Ratio: 3.2 ratio (ref 0.0–5.0)
Cholesterol, Total: 168 mg/dL (ref 100–199)
HDL: 52 mg/dL (ref >39)
LDL Chol Calc (NIH): 86 mg/dL (ref 0–99)
Triglycerides: 176 mg/dL — ABNORMAL HIGH (ref 0–149)
VLDL Cholesterol Cal: 30 mg/dL (ref 5–40)

## 2022-01-13 LAB — COMPREHENSIVE METABOLIC PANEL
ALT: 14 IU/L (ref 0–44)
AST: 17 IU/L (ref 0–40)
Albumin/Globulin Ratio: 1.6 (ref 1.2–2.2)
Albumin: 4 g/dL (ref 3.8–4.8)
Alkaline Phosphatase: 78 IU/L (ref 44–121)
BUN/Creatinine Ratio: 13 (ref 10–24)
BUN: 28 mg/dL — ABNORMAL HIGH (ref 8–27)
Bilirubin Total: 0.3 mg/dL (ref 0.0–1.2)
CO2: 29 mmol/L (ref 20–29)
Calcium: 9.3 mg/dL (ref 8.6–10.2)
Chloride: 98 mmol/L (ref 96–106)
Creatinine, Ser: 2.11 mg/dL — ABNORMAL HIGH (ref 0.76–1.27)
Globulin, Total: 2.5 g/dL (ref 1.5–4.5)
Glucose: 182 mg/dL — ABNORMAL HIGH (ref 70–99)
Potassium: 4 mmol/L (ref 3.5–5.2)
Sodium: 141 mmol/L (ref 134–144)
Total Protein: 6.5 g/dL (ref 6.0–8.5)
eGFR: 31 mL/min/{1.73_m2} — ABNORMAL LOW (ref >59)

## 2022-01-13 LAB — T4, FREE: Free T4: 1.12 ng/dL (ref 0.82–1.77)

## 2022-01-13 LAB — TSH: TSH: 1.77 u[IU]/mL (ref 0.450–4.500)

## 2022-01-17 ENCOUNTER — Ambulatory Visit: Payer: HMO | Admitting: "Endocrinology

## 2022-01-17 ENCOUNTER — Encounter: Payer: Self-pay | Admitting: "Endocrinology

## 2022-01-17 VITALS — BP 136/78 | HR 60 | Ht 73.0 in | Wt 201.8 lb

## 2022-01-17 DIAGNOSIS — E1122 Type 2 diabetes mellitus with diabetic chronic kidney disease: Secondary | ICD-10-CM

## 2022-01-17 DIAGNOSIS — I10 Essential (primary) hypertension: Secondary | ICD-10-CM

## 2022-01-17 DIAGNOSIS — Z794 Long term (current) use of insulin: Secondary | ICD-10-CM

## 2022-01-17 DIAGNOSIS — N1831 Chronic kidney disease, stage 3a: Secondary | ICD-10-CM

## 2022-01-17 DIAGNOSIS — E782 Mixed hyperlipidemia: Secondary | ICD-10-CM

## 2022-01-17 LAB — POCT GLYCOSYLATED HEMOGLOBIN (HGB A1C): HbA1c, POC (controlled diabetic range): 8.1 % — AB (ref 0.0–7.0)

## 2022-01-17 NOTE — Patient Instructions (Signed)

## 2022-01-17 NOTE — Progress Notes (Signed)
01/17/2022  Endocrinology follow-up note  Subjective:    Patient ID: Hector Rose, male    DOB: 1940/03/28,    Past Medical History:  Diagnosis Date   Asthma 04-11-11   AS CHILD ONLY   BPH (benign prostatic hypertrophy) with urinary obstruction    Carpal tunnel syndrome    patient denies at 05/14/17 preop visit    Difficult intubation 04-11-11   08-11-97 some issues with intubation/record with chart   DJD (degenerative joint disease)    DM (diabetes mellitus) ([place]) 04-11-11   tyep II    DVT femoral (deep venous thrombosis) with thrombophlebitis ([place]) 04-11-11   ?'09-tx. on Xarelton x 3-4 years    GERD (gastroesophageal reflux disease) 04-11-11   mild, no med in 1 month   Gout 04-11-11    ankle 2 yrs ago   Hemorrhoids    Hypercholesteremia    Hypertension    Internal hemorrhoids    Keloid 04-11-11   multiple-arms,back, chest   Pulmonary nodule 04-11-11   "PT. NOT AWARE" -denies problems with breathing   Renal calculus    Thyroid disease    hypothyroid   Past Surgical History:  Procedure Laterality Date   CATARACT EXTRACTION W/PHACO Left 10/28/2021   Procedure: CATARACT EXTRACTION PHACO AND INTRAOCULAR LENS PLACEMENT ([place]);  Surgeon: Baruch Goldmann, MD;  Location: [facility];  Service: Ophthalmology;  Laterality: Left;  CDE: 6.61   cataract surgery  12/2013   COLONOSCOPY WITH PROPOFOL N/A 02/05/2017   Procedure: COLONOSCOPY WITH PROPOFOL;  Surgeon: Ladene Artist, MD;  Location: [facility];  Service: Endoscopy;  Laterality: N/A;   COSMETIC SURGERY  2000   Keloid injections   CYSTOSCOPY WITH BIOPSY  04/14/2011   Procedure: CYSTOSCOPY WITH BIOPSY;  Surgeon: Dutch Gray, MD;  Location: [facility];  Service: Urology;  Laterality: N/A;  Bladder Biopsies    CYSTOSCOPY/RETROGRADE/URETEROSCOPY  04/14/2011   Procedure: CYSTOSCOPY/RETROGRADE/URETEROSCOPY;  Surgeon: Dutch Gray, MD;  Location: [facility];  Service: Urology;  Laterality: Bilateral;  (Bil) RPG    LAPAROSCOPIC NEPHRECTOMY Right  05/21/2017   Procedure: LAPAROSCOPIC RADICAL NEPHRECTOMY;  Surgeon: Raynelle Bring, MD;  Location: [facility];  Service: Urology;  Laterality: Right;   ortho surgery on fingers  2005 & 2008   Dr. Lorelle Formosa finger release   PROSTATECTOMY  1999   Dr. Rosana Hoes   Social History   Socioeconomic History   Marital status: Married    Spouse name: Not on file   Number of children: 2   Years of education: Not on file   Highest education level: Not on file  Occupational History   Occupation: retired from [employer] Use   Smoking status: Never   Smokeless tobacco: Never  Vaping Use   Vaping Use: Never used  Substance and Sexual Activity   Alcohol use: No    Alcohol/week: 0.0 standard drinks of alcohol   Drug use: No   Sexual activity: Yes  Other Topics Concern   Not on file  Social History Narrative   Not on file   Social Determinants of Health   Financial Resource Strain: Not on file  Food Insecurity: Not on file  Transportation Needs: Not on file  Physical Activity: Not on file  Stress: Not on file  Social Connections: Not on file   Outpatient Encounter Medications as of 01/17/2022  Medication Sig   allopurinol (ZYLOPRIM) 300 MG tablet TAKE 1 TABLET BY MOUTH ONCE DAILY (Patient taking differently: daily as needed. TAKE 1 TABLET BY MOUTH ONCE DAILY  AS NEEDED)   amLODipine (NORVASC) 10 MG tablet TAKE 1 TABLET EVERY MORNING   aspirin EC 81 MG tablet Take 81 mg by mouth daily. Swallow whole.   Blood Glucose Monitoring Suppl (ACCU-CHEK GUIDE ME) w/Device KIT 1 Piece by Does not apply route as directed.   Continuous Blood Gluc Receiver (FREESTYLE LIBRE 2 READER) DEVI As directed   Continuous Blood Gluc Sensor (FREESTYLE LIBRE 2 SENSOR) MISC 1 Piece by Does not apply route every 14 (fourteen) days.   diphenhydrAMINE (BENADRYL) 25 MG tablet Take 25 mg by mouth daily as needed for allergies.   donepezil (ARICEPT) 5 MG tablet donepezil 5 mg tablet  TAKE 1 TABLET BY MOUTH EVERYDAY  AT BEDTIME   fluticasone (FLONASE) 50 MCG/ACT nasal spray Place 2 sprays daily as needed into both nostrils for allergies or rhinitis.   gabapentin (NEURONTIN) 600 MG tablet Take 600-1,200 mg by mouth See admin instructions. Take 1200 mg in the morning and 600 mg at night   hydrochlorothiazide (HYDRODIURIL) 25 MG tablet Take 1 tablet by mouth daily.   hydrochlorothiazide (HYDRODIURIL) 25 MG tablet TAKE 1 TABLET BY MOUTH EVERY DAY   insulin NPH-regular Human (NOVOLIN 70/30) (70-30) 100 UNIT/ML injection Inject 20 units with breakfast, 20 units with supper when premeal blood glucose readings above 90 mg per DL .   Insulin Syringe-Needle U-100 (BD VEO INSULIN SYRINGE U/F) 31G X 15/64" 1 ML MISC USE TWICE A DAY  To inject insulin   Lancets (ONETOUCH DELICA PLUS [id number]) MISC USE TO TEST BLOOD SUGAR TWICE DAILY.   metoprolol tartrate (LOPRESSOR) 25 MG tablet Take 25 mg by mouth 2 (two) times daily.   omeprazole (PRILOSEC) 20 MG capsule Take 1 capsule (20 mg total) by mouth daily. (Patient taking differently: Take 20 mg by mouth daily as needed.)   ONETOUCH ULTRA test strip USE TO TEST BLOOD SUGAR TWICE DAILY   predniSONE (DELTASONE) 10 MG tablet Take by mouth.   Propylene Glycol (SYSTANE BALANCE OP) Apply 1 drop as needed to eye (irritation).   Rivaroxaban (XARELTO) 15 MG TABS tablet Take 15 mg by mouth daily.    [DISCONTINUED] simvastatin (ZOCOR) 40 MG tablet Takes 1/2 tablet daily bedtime   No facility-administered encounter medications on file as of 01/17/2022.   ALLERGIES: Allergies  Allergen Reactions   Ace Inhibitors Swelling    REACTION: angio edema (swelling of lips)   Allopurinol    Metformin And Related     Itching in higher doses    VACCINATION STATUS: Immunization History  Administered Date(s) Administered   Influenza Split 11/27/2011, 12/30/2012, 01/01/2014, 03/13/2017   Influenza Whole 01/27/2009, 11/30/2009, 11/17/2010   Influenza, High Dose Seasonal PF 12/21/2015,  01/04/2018   Pneumococcal Conjugate-13 02/04/2018   Pneumococcal Polysaccharide-23 12/02/2008    Diabetes He presents for his follow-up diabetic visit. He has type 2 diabetes mellitus. Onset time: Diagnosed approximately  at age 81. His disease course has been worsening. Pertinent negatives for hypoglycemia include no confusion, headaches, pallor or seizures. Pertinent negatives for diabetes include no chest pain, no fatigue, no polydipsia, no polyphagia, no polyuria and no weakness. Symptoms are worsening. Diabetic complications include nephropathy. Risk factors for coronary artery disease include diabetes mellitus, dyslipidemia, male sex, hypertension and sedentary lifestyle. Current diabetic treatment includes insulin injections. His weight is fluctuating minimally. He is following a generally unhealthy diet. Meal planning includes ADA exchanges. He participates in exercise intermittently. His home blood glucose trend is increasing steadily. His breakfast blood glucose range is generally 140-180 mg/dl.  His bedtime blood glucose range is generally 180-200 mg/dl. His overall blood glucose range is 180-200 mg/dl. (He presents with worsening glycemic profile due to the fact that he missed at least a third of his insulin injection opportunities.  His point-of-care A1c is 8.1%, increasing from 7.5% during his last visit.    He did not document or report hypoglycemia.   ) An ACE inhibitor/angiotensin II receptor blocker is contraindicated.  Hypertension This is a chronic problem. The current episode started more than 1 year ago. The problem is controlled. Pertinent negatives include no chest pain, headaches, neck pain, palpitations or shortness of breath. Risk factors for coronary artery disease include dyslipidemia, diabetes mellitus, male gender and sedentary lifestyle. Past treatments include calcium channel blockers.  Hyperlipidemia This is a chronic problem. The current episode started more than 1 year  ago. The problem is uncontrolled. Exacerbating diseases include diabetes. Pertinent negatives include no chest pain, myalgias or shortness of breath. Current antihyperlipidemic treatment includes bile acid squestrants. Risk factors for coronary artery disease include dyslipidemia and diabetes mellitus.    Review of systems Limited as above.   Objective:    BP 136/78   Pulse 60   Ht _0  (1.854 m)   Wt 201 lb 12.8 oz (91.5 kg)   BMI 26.62 kg/m   Wt Readings from Last 3 Encounters:  01/17/22 201 lb 12.8 oz (91.5 kg)  10/25/21 201 lb 1 oz (91.2 kg)  09/14/21 201 lb (91.2 kg)      Recent Results (from the past 2160 hour(s))  Glucose, capillary     Status: None   Collection Time: 10/28/21 12:45 PM  Result Value Ref Range   Glucose-Capillary 85 70 - 99 mg/dL    Comment: Glucose reference range applies only to samples taken after fasting for at least 8 hours.  Comprehensive metabolic panel     Status: Abnormal   Collection Time: 01/12/22  9:26 AM  Result Value Ref Range   Glucose 182 (H) 70 - 99 mg/dL   BUN 28 (H) 8 - 27 mg/dL   Creatinine, Ser 2.11 (H) 0.76 - 1.27 mg/dL   eGFR 31 (L) >59 mL/min/1.73   BUN/Creatinine Ratio 13 10 - 24   Sodium 141 134 - 144 mmol/L   Potassium 4.0 3.5 - 5.2 mmol/L   Chloride 98 96 - 106 mmol/L   CO2 29 20 - 29 mmol/L   Calcium 9.3 8.6 - 10.2 mg/dL   Total Protein 6.5 6.0 - 8.5 g/dL   Albumin 4.0 3.8 - 4.8 g/dL   Globulin, Total 2.5 1.5 - 4.5 g/dL   Albumin/Globulin Ratio 1.6 1.2 - 2.2   Bilirubin Total 0.3 0.0 - 1.2 mg/dL   Alkaline Phosphatase 78 44 - 121 IU/L   AST 17 0 - 40 IU/L   ALT 14 0 - 44 IU/L  Lipid panel     Status: Abnormal   Collection Time: 01/12/22  9:26 AM  Result Value Ref Range   Cholesterol, Total 168 100 - 199 mg/dL   Triglycerides 176 (H) 0 - 149 mg/dL   HDL 52 >39 mg/dL   VLDL Cholesterol Cal 30 5 - 40 mg/dL   LDL Chol Calc (NIH) 86 0 - 99 mg/dL   Chol/HDL Ratio 3.2 0.0 - 5.0 ratio    Comment:  T. Chol/HDL Ratio                                             Men  Women                               1/2 Avg.Risk  3.4    3.3                                   Avg.Risk  5.0    4.4                                2X Avg.Risk  9.6    7.1                                3X Avg.Risk 23.4   11.0   TSH     Status: None   Collection Time: 01/12/22  9:26 AM  Result Value Ref Range   TSH 1.770 0.450 - 4.500 uIU/mL  T4, free     Status: None   Collection Time: 01/12/22  9:26 AM  Result Value Ref Range   Free T4 1.12 0.82 - 1.77 ng/dL  HgB A1c     Status: Abnormal   Collection Time: 01/17/22 10:39 AM  Result Value Ref Range   Hemoglobin A1C     HbA1c POC (<> result, manual entry)     HbA1c, POC (prediabetic range)     HbA1c, POC (controlled diabetic range) 8.1 (A) 0.0 - 7.0 %   Lipid Panel     Component Value Date/Time   CHOL 168 01/12/2022 0926   TRIG 176 (H) 01/12/2022 0926   HDL 52 01/12/2022 0926   CHOLHDL 3.2 01/12/2022 0926   CHOLHDL 4.8 11/20/2017 0745   VLDL 28 03/17/2016 0714   LDLCALC 86 01/12/2022 0926   LDLCALC 152 (H) 11/20/2017 0745   LDLDIRECT 147.5 06/10/2012 0930     Assessment & Plan:   1. Type 2 diabetes mellitus with stage 2 chronic kidney disease  He presents with worsening glycemic profile due to the fact that he missed at least a third of his insulin injection opportunities.  His point-of-care A1c is 8.1%, increasing from 7.5% during his last visit.    He did not document or report hypoglycemia.     Patient is advised to stick to the recommended insulin dose and a routine mealtimes to eat 3 meals  a day and avoid unnecessary snacks ( to snack only to correct hypoglycemia). - he acknowledges that there is a room for improvement in his food and drink choices. - Suggestion is made for him to avoid simple carbohydrates  from his diet including Cakes, Sweet Desserts, Ice Cream, Soda (diet and regular), Sweet Tea, Candies, Chips, Cookies, Store  Bought Juices, Alcohol in Excess of  1-2 drinks a day, Artificial Sweeteners,  Coffee Creamer, and "Sugar-free" Products, Lemonade. This will help patient to have more stable blood glucose profile and potentially avoid unintended weight gain.  -He worries about hypoglycemia, and he is at risk for hypoglycemia from inadvertent use of insulin.  Priority in his care would  be to avoid hypoglycemia.    -However, he is approached to cover his 2 major meals daily with prescribed insulin as follows.  He is advised to resume and continue Novolin 70/30  20 units with breakfast and 20 units with supper only when his Premeal blood glucose readings are above 90 mg/day.    - He is advised to continue glucose monitoring at least 2 times daily-before breakfast and at supper and anytime as needed. - Patient is instructed to call back with extremes of blood glucose readings  less than 70 or greater than 200 mg/dl. He now uses freestyle libre CGM, 2 L at all times. -He is not a candidate for Metformin, SGLT2 inhibitors, nor incretin therapy.   -I discussed and prescribed the freestyle libre device for him.   2. Mixed hyperlipidemia - His repeat lipid panel showed significant improvement in his LDL at 92 from 166.  He is tolerating Crestor, advised to continue Crestor 20 mg p.o. nightly.  Side effects and precautions discussed with him.     - He is advised to continue with omega-3 fatty acids ,avoid butter and fried food and exercise regularly.  3. Essential hypertension  -His blood pressure is controlled to target.   He is currently on amlodipine 10 mg p.o. daily, metoprolol 50 mg daily.  I discussed and added hydrochlorothiazide 25 mg p.o. daily at breakfast for better control of hypertension.    4) vitamin D deficiency -He is on ongoing supplement with vitamin D3  5000 units daily for the next 90 days.   5) multinodular goiter: -She was recently seen for work-up of multinodular goiter.  It was  observed that he has had stable euthyroid multinodular goiter documented for the last 7 years.  He will not need biopsy or surgical intervention for this at this time.  7 years of stability makes it unnecessary to do another thyroid imaging.  His recent thyroid function test and within normal state, he will need yearly thyroid function test.  His recent screening ABI was negative for PAD on June 03, 2020.  His neck study will be repeated in March 2027, or sooner if needed.   He is advised to continue close follow-up with his PCP Dr. Grier Mitts.   I spent 27 minutes in the care of the patient today including review of labs from [place], Lipids, Thyroid Function, Hematology (current and previous including abstractions from other facilities); face-to-face time discussing  his blood glucose readings/logs, discussing hypoglycemia and hyperglycemia episodes and symptoms, medications doses, his options of short and long term treatment based on the latest standards of care / guidelines;  discussion about incorporating lifestyle medicine;  and documenting the encounter. Risk reduction counseling performed per USPSTF guidelines to reduce  cardiovascular risk factors.     Please refer to Patient Instructions for Blood Glucose Monitoring and Insulin/Medications Dosing Guide"  in media tab for additional information. Please  also refer to " Patient Self Inventory" in the Media  tab for reviewed elements of pertinent patient history.  Hector Rose in the discussions, expressed understanding, and voiced agreement with the above plans.  All questions were answered to his satisfaction. he is encouraged to contact clinic should he have any questions or concerns prior to his return visit.   Follow up plan: Return in about 4 months (around 05/18/2022) for Bring Meter/CGM Device/Logs- A1c in Office.  Glade Lloyd, MD Phone: [phone]  Fax: [phone]  -  This note was partially dictated with voice  recognition software. Similar sounding words can be transcribed inadequately or may not  be corrected upon review.  01/17/2022, 10:51 AM

## 2022-02-11 ENCOUNTER — Other Ambulatory Visit: Payer: Self-pay | Admitting: "Endocrinology

## 2022-02-11 DIAGNOSIS — N1831 Chronic kidney disease, stage 3a: Secondary | ICD-10-CM

## 2022-02-13 ENCOUNTER — Other Ambulatory Visit: Payer: Self-pay | Admitting: "Endocrinology

## 2022-02-14 ENCOUNTER — Other Ambulatory Visit: Payer: Self-pay

## 2022-02-14 DIAGNOSIS — E1122 Type 2 diabetes mellitus with diabetic chronic kidney disease: Secondary | ICD-10-CM

## 2022-02-14 MED ORDER — FREESTYLE LIBRE 2 SENSOR MISC: 2 refills | Status: DC

## 2022-03-13 ENCOUNTER — Other Ambulatory Visit: Payer: Self-pay

## 2022-03-13 ENCOUNTER — Encounter: Payer: Self-pay | Admitting: *Deleted

## 2022-03-13 ENCOUNTER — Ambulatory Visit
Admission: EM | Admit: 2022-03-13 | Discharge: 2022-03-13 | Disposition: A | Discharge status: Home / Self Care | Payer: HMO | Attending: Nurse Practitioner | Admitting: Nurse Practitioner

## 2022-03-13 DIAGNOSIS — J069 Acute upper respiratory infection, unspecified: Secondary | ICD-10-CM | POA: Insufficient documentation

## 2022-03-13 DIAGNOSIS — Z1152 Encounter for screening for COVID-19: Secondary | ICD-10-CM | POA: Insufficient documentation

## 2022-03-13 MED ORDER — BENZONATATE 100 MG PO CAPS: 100.0000 mg | ORAL_CAPSULE | Freq: Three times a day (TID) | ORAL | 0 refills | Status: DC | PRN

## 2022-03-13 NOTE — ED Provider Notes (Signed)
[facility]    CSN: [id number] Arrival date & time: 03/13/22  1016      History   Chief Complaint Chief Complaint  Patient presents with   Cough   Headache    HPI Hector Rose is a 81 y.o. male.   Patient presents with wife for 3 days of symptoms including low-grade fever, productive cough, congestion in his throat and nose, runny nose, sneezing, headache when coughing and pressure in his head when coughing, and fatigue.  Patient denies shortness of breath or chest pain, chest congestion, pain with inspiration, sore throat, ear pain, abdominal pain, nausea/vomiting, diarrhea, or known sick contacts.  Has been taking Tylenol and took 1 dose of Zyrtec which has not helped.  Patient denies history of chronic lung disease.  Does not use inhalers.  Denies smoking history.    Past Medical History:  Diagnosis Date   Asthma 04-11-11   AS CHILD ONLY   BPH (benign prostatic hypertrophy) with urinary obstruction    Carpal tunnel syndrome    patient denies at 05/14/17 preop visit    Difficult intubation 04-11-11   08-11-97 some issues with intubation/record with chart   DJD (degenerative joint disease)    DM (diabetes mellitus) ([place]) 04-11-11   tyep II    DVT femoral (deep venous thrombosis) with thrombophlebitis ([place]) 04-11-11   ?'09-tx. on Xarelton x 3-4 years    GERD (gastroesophageal reflux disease) 04-11-11   mild, no med in 1 month   Gout 04-11-11    ankle 2 yrs ago   Hemorrhoids    Hypercholesteremia    Hypertension    Internal hemorrhoids    Keloid 04-11-11   multiple-arms,back, chest   Pulmonary nodule 04-11-11   "PT. NOT AWARE" -denies problems with breathing   Renal calculus    Thyroid disease    hypothyroid    Patient Active Problem List   Diagnosis Date Noted   Multinodular goiter 04/29/2020   S/p nephrectomy 02/04/2018   Neoplasm of right kidney 05/21/2017   Vitamin D deficiency 03/28/2017   Special screening for malignant neoplasms, colon    Benign  neoplasm of transverse colon    History of DVT (deep vein thrombosis) 07/20/2016   Renal lesion 07/20/2016   Type 2 diabetes mellitus with stage 3a chronic kidney disease, with long-term current use of insulin ([place]) 01/15/2014   Chronic idiopathic gout of multiple sites 01/15/2014   Calcified pleural plaque on chest x-ray 06/30/2013   Tinea pedis 09/28/2011   Hematuria 05/19/2011   HEMORRHOIDS 05/29/2007   Pulmonary nodule 03/27/2007   GERD 03/27/2007   Benign prostatic hyperplasia with urinary obstruction 03/27/2007   KELOID 03/27/2007   Mixed hyperlipidemia 01/24/2007   CARPAL TUNNEL SYNDROME 01/24/2007   Essential hypertension 01/24/2007   RENAL CALCULUS 01/24/2007   Osteoarthritis 01/24/2007    Past Surgical History:  Procedure Laterality Date   CATARACT EXTRACTION W/PHACO Left 10/28/2021   Procedure: CATARACT EXTRACTION PHACO AND INTRAOCULAR LENS PLACEMENT ([place]);  Surgeon: Baruch Goldmann, MD;  Location: [facility];  Service: Ophthalmology;  Laterality: Left;  CDE: 6.61   cataract surgery  12/2013   COLONOSCOPY WITH PROPOFOL N/A 02/05/2017   Procedure: COLONOSCOPY WITH PROPOFOL;  Surgeon: Ladene Artist, MD;  Location: [facility];  Service: Endoscopy;  Laterality: N/A;   COSMETIC SURGERY  2000   Keloid injections   CYSTOSCOPY WITH BIOPSY  04/14/2011   Procedure: CYSTOSCOPY WITH BIOPSY;  Surgeon: Dutch Gray, MD;  Location: [facility];  Service: Urology;  Laterality:  N/A;  Bladder Biopsies    CYSTOSCOPY/RETROGRADE/URETEROSCOPY  04/14/2011   Procedure: CYSTOSCOPY/RETROGRADE/URETEROSCOPY;  Surgeon: Dutch Gray, MD;  Location: [facility];  Service: Urology;  Laterality: Bilateral;  (Bil) RPG    LAPAROSCOPIC NEPHRECTOMY Right 05/21/2017   Procedure: LAPAROSCOPIC RADICAL NEPHRECTOMY;  Surgeon: Raynelle Bring, MD;  Location: [facility];  Service: Urology;  Laterality: Right;   ortho surgery on fingers  2005 & 2008   Dr. Lorelle Formosa finger release   PROSTATECTOMY  1999   Dr. Rosana Hoes       Home  Medications    Prior to Admission medications   Medication Sig Start Date End Date Taking? Authorizing Provider  benzonatate (TESSALON) 100 MG capsule Take 1 capsule (100 mg total) by mouth 3 (three) times daily as needed for cough. Do not take with alcohol or while driving or operating heavy machinery.  May cause drowsiness. 03/13/22  Yes Noemi Chapel A, NP  allopurinol (ZYLOPRIM) 300 MG tablet TAKE 1 TABLET BY MOUTH ONCE DAILY Patient taking differently: daily as needed. TAKE 1 TABLET BY MOUTH ONCE DAILY AS NEEDED 06/06/13   Noralee Space, MD  amLODipine (NORVASC) 10 MG tablet TAKE 1 TABLET EVERY MORNING    Noralee Space, MD  aspirin EC 81 MG tablet Take 81 mg by mouth daily. Swallow whole.    [provider]  Blood Glucose Monitoring Suppl (ACCU-CHEK GUIDE ME) w/Device KIT 1 Piece by Does not apply route as directed. 06/03/20   Cassandria Anger, MD  Continuous Blood Gluc Receiver (FREESTYLE LIBRE 2 READER) DEVI As directed 10/14/21   Cassandria Anger, MD  Continuous Blood Gluc Sensor (FREESTYLE LIBRE 2 SENSOR) MISC APPLY NEW SENSOR EVERY 14 (FOURTEEN) DAYS. 02/14/22   Cassandria Anger, MD  diphenhydrAMINE (BENADRYL) 25 MG tablet Take 25 mg by mouth daily as needed for allergies.    [provider]  donepezil (ARICEPT) 5 MG tablet donepezil 5 mg tablet  TAKE 1 TABLET BY MOUTH EVERYDAY AT BEDTIME    [provider]  fluticasone (FLONASE) 50 MCG/ACT nasal spray Place 2 sprays daily as needed into both nostrils for allergies or rhinitis.    [provider]  gabapentin (NEURONTIN) 600 MG tablet Take 600-1,200 mg by mouth See admin instructions. Take 1200 mg in the morning and 600 mg at night    [provider]  hydrochlorothiazide (HYDRODIURIL) 25 MG tablet Take 1 tablet by mouth daily. 10/05/20   [provider]  hydrochlorothiazide (HYDRODIURIL) 25 MG tablet TAKE 1 TABLET BY MOUTH EVERY DAY 02/14/22   Nida, Marella Chimes, MD   insulin NPH-regular Human (NOVOLIN 70/30) (70-30) 100 UNIT/ML injection Inject 20 units with breakfast, 20 units with supper when premeal blood glucose readings above 90 mg per DL . 09/14/21   Nida, Marella Chimes, MD  Insulin Syringe-Needle U-100 (BD VEO INSULIN SYRINGE U/F) 31G X 15/64" 1 ML MISC USE TWICE A DAY  To inject insulin 06/03/20   Nida, Marella Chimes, MD  Lancets (ONETOUCH DELICA PLUS [id number]) MISC USE TO TEST BLOOD SUGAR TWICE DAILY. 06/03/20   Cassandria Anger, MD  metoprolol tartrate (LOPRESSOR) 25 MG tablet Take 25 mg by mouth 2 (two) times daily.    [provider]  omeprazole (PRILOSEC) 20 MG capsule Take 1 capsule (20 mg total) by mouth daily. Patient taking differently: Take 20 mg by mouth daily as needed. 02/10/20   Maudie Flakes, MD  [facility] ULTRA test strip USE TO TEST BLOOD SUGAR TWICE DAILY 10/24/21  Cassandria Anger, MD  predniSONE (DELTASONE) 10 MG tablet Take by mouth.    [provider]  Propylene Glycol (SYSTANE BALANCE OP) Apply 1 drop as needed to eye (irritation).    [provider]  Rivaroxaban (XARELTO) 15 MG TABS tablet Take 15 mg by mouth daily.     [provider]  simvastatin (ZOCOR) 40 MG tablet Takes 1/2 tablet daily bedtime 02/13/11 05/19/11  Noralee Space, MD    Family History History reviewed. No pertinent family history.  Social History Social History   Tobacco Use   Smoking status: Never   Smokeless tobacco: Never  Vaping Use   Vaping Use: Never used  Substance Use Topics   Alcohol use: No    Alcohol/week: 0.0 standard drinks of alcohol   Drug use: No     Allergies   Ace inhibitors, Allopurinol, and Metformin and related   Review of Systems Review of Systems Per HPI  Physical Exam Triage Vital Signs ED Triage Vitals  Enc Vitals Group     BP 03/13/22 1301 (!) 143/79     Pulse Rate 03/13/22 1301 83     Resp 03/13/22 1301 18     Temp 03/13/22 1301 99.2 F (37.3 C)     Temp src  --      SpO2 03/13/22 1301 99 %     Weight --      Height --      Head Circumference --      Peak Flow --      Pain Score 03/13/22 1302 0     Pain Loc --      Pain Edu? --      Excl. in [place]? --    No data found.  Updated Vital Signs BP (!) 143/79   Pulse 83   Temp 99.2 F (37.3 C)   Resp 18   SpO2 99%   Visual Acuity Right Eye Distance:   Left Eye Distance:   Bilateral Distance:    Right Eye Near:   Left Eye Near:    Bilateral Near:     Physical Exam Vitals and nursing note reviewed.  Constitutional:      General: He is not in acute distress.    Appearance: Normal appearance. He is not ill-appearing or toxic-appearing.  HENT:     Head: Normocephalic and atraumatic.     Right Ear: Tympanic membrane, ear canal and external ear normal.     Left Ear: Tympanic membrane, ear canal and external ear normal.     Nose: Congestion and rhinorrhea present.     Mouth/Throat:     Mouth: Mucous membranes are moist.     Pharynx: Oropharynx is clear. Posterior oropharyngeal erythema present. No oropharyngeal exudate.  Eyes:     General: No scleral icterus.    Extraocular Movements: Extraocular movements intact.  Cardiovascular:     Rate and Rhythm: Normal rate and regular rhythm.  Pulmonary:     Effort: Pulmonary effort is normal. No respiratory distress.     Breath sounds: Normal breath sounds. No wheezing, rhonchi or rales.  Abdominal:     General: Abdomen is flat. Bowel sounds are normal. There is no distension.     Palpations: Abdomen is soft.     Tenderness: There is no abdominal tenderness. There is no guarding.  Musculoskeletal:     Cervical back: Normal range of motion and neck supple.  Lymphadenopathy:     Cervical: No cervical adenopathy.  Skin:  General: Skin is warm and dry.     Coloration: Skin is not jaundiced or pale.     Findings: No erythema or rash.  Neurological:     Mental Status: He is alert and oriented to person, place, and time.  Psychiatric:         Behavior: Behavior is cooperative.      UC Treatments / Results  Labs (all labs ordered are listed, but only abnormal results are displayed) Labs Reviewed  SARS CORONAVIRUS 2 (TAT 6-24 HRS)    EKG   Radiology No results found.  Procedures Procedures (including critical care time)  Medications Ordered in UC Medications - No data to display  Initial Impression / Assessment and Plan / UC Course  I have reviewed the triage vital signs and the nursing notes.  Pertinent labs & imaging results that were available during my care of the patient were reviewed by me and considered in my medical decision making (see chart for details).   Patient is well-appearing, normotensive, afebrile, not tachycardic, not tachypneic, oxygenating well on room air.    Viral URI with cough Encounter for screening for COVID-19 Suspect viral etiology COVID-19 testing obtained; patient is a candidate for molnupiravir if he tests positive Unable to perform influenza testing secondary to national shortage of testing materials Supportive care discussed Start cough suppressant ER and return precautions discussed with patient and wife  The patient was given the opportunity to ask questions.  All questions answered to their satisfaction.  The patient is in agreement to this plan.    Final Clinical Impressions(s) / UC Diagnoses   Final diagnoses:  Viral URI with cough  Encounter for screening for COVID-19     Discharge Instructions      You have a viral upper respiratory infection.  Symptoms should improve over the next week to 10 days.  If you develop chest pain or shortness of breath, go to the emergency room.  We have tested you today for COVID-19.  You will see the results in Mychart and we will call you with positive results.    Please stay home and isolate until you are aware of the results.    Some things that can make you feel better are: - Increased rest - Increasing fluid with  water/sugar free electrolytes - Acetaminophen as needed for fever/pain - Salt water gargling, chloraseptic spray and throat lozenges for sore throat - OTC guaifenesin (Mucinex) 600 mg twice daily for congestion - Saline sinus flushes or a neti pot for nasal congestion - Humidifying the air -Tessalon Perles every 8 hours as needed for dry cough     ED Prescriptions     Medication Sig Dispense Auth. Provider   benzonatate (TESSALON) 100 MG capsule Take 1 capsule (100 mg total) by mouth 3 (three) times daily as needed for cough. Do not take with alcohol or while driving or operating heavy machinery.  May cause drowsiness. 21 capsule Eulogio Bear, NP      PDMP not reviewed this encounter.   Eulogio Bear, NP 03/13/22 1410

## 2022-03-13 NOTE — Discharge Instructions (Addendum)
You have a viral upper respiratory infection.  Symptoms should improve over the next week to 10 days.  If you develop chest pain or shortness of breath, go to the emergency room.  We have tested you today for COVID-19.  You will see the results in Mychart and we will call you with positive results.    Please stay home and isolate until you are aware of the results.    Some things that can make you feel better are: - Increased rest - Increasing fluid with water/sugar free electrolytes - Acetaminophen as needed for fever/pain - Salt water gargling, chloraseptic spray and throat lozenges for sore throat - OTC guaifenesin (Mucinex) 600 mg twice daily for congestion - Saline sinus flushes or a neti pot for nasal congestion - Humidifying the air -Tessalon Perles every 8 hours as needed for dry cough

## 2022-03-13 NOTE — ED Triage Notes (Signed)
Pt reports an on going cough and HA when he coughs.

## 2022-03-14 LAB — SARS CORONAVIRUS 2 (TAT 6-24 HRS): SARS Coronavirus 2: NEGATIVE

## 2022-03-15 DIAGNOSIS — Z6827 Body mass index (BMI) 27.0-27.9, adult: Secondary | ICD-10-CM | POA: Diagnosis not present

## 2022-03-15 DIAGNOSIS — E663 Overweight: Secondary | ICD-10-CM | POA: Diagnosis not present

## 2022-03-15 DIAGNOSIS — J40 Bronchitis, not specified as acute or chronic: Secondary | ICD-10-CM | POA: Diagnosis not present

## 2022-03-20 DIAGNOSIS — E663 Overweight: Secondary | ICD-10-CM | POA: Diagnosis not present

## 2022-03-20 DIAGNOSIS — Z6827 Body mass index (BMI) 27.0-27.9, adult: Secondary | ICD-10-CM | POA: Diagnosis not present

## 2022-03-20 DIAGNOSIS — M109 Gout, unspecified: Secondary | ICD-10-CM | POA: Diagnosis not present

## 2022-03-28 ENCOUNTER — Other Ambulatory Visit: Payer: Self-pay | Admitting: "Endocrinology

## 2022-03-28 DIAGNOSIS — N182 Chronic kidney disease, stage 2 (mild): Secondary | ICD-10-CM | POA: Diagnosis not present

## 2022-03-28 DIAGNOSIS — E1140 Type 2 diabetes mellitus with diabetic neuropathy, unspecified: Secondary | ICD-10-CM | POA: Diagnosis not present

## 2022-03-28 DIAGNOSIS — Z6826 Body mass index (BMI) 26.0-26.9, adult: Secondary | ICD-10-CM | POA: Diagnosis not present

## 2022-03-28 DIAGNOSIS — E663 Overweight: Secondary | ICD-10-CM | POA: Diagnosis not present

## 2022-03-28 DIAGNOSIS — C679 Malignant neoplasm of bladder, unspecified: Secondary | ICD-10-CM | POA: Diagnosis not present

## 2022-03-28 DIAGNOSIS — G250 Essential tremor: Secondary | ICD-10-CM | POA: Diagnosis not present

## 2022-03-28 DIAGNOSIS — J189 Pneumonia, unspecified organism: Secondary | ICD-10-CM | POA: Diagnosis not present

## 2022-04-12 DIAGNOSIS — I10 Essential (primary) hypertension: Secondary | ICD-10-CM | POA: Diagnosis not present

## 2022-04-12 DIAGNOSIS — E1122 Type 2 diabetes mellitus with diabetic chronic kidney disease: Secondary | ICD-10-CM | POA: Diagnosis not present

## 2022-04-12 DIAGNOSIS — E782 Mixed hyperlipidemia: Secondary | ICD-10-CM | POA: Diagnosis not present

## 2022-04-24 DIAGNOSIS — Z6827 Body mass index (BMI) 27.0-27.9, adult: Secondary | ICD-10-CM | POA: Diagnosis not present

## 2022-04-24 DIAGNOSIS — C679 Malignant neoplasm of bladder, unspecified: Secondary | ICD-10-CM | POA: Diagnosis not present

## 2022-04-24 DIAGNOSIS — E663 Overweight: Secondary | ICD-10-CM | POA: Diagnosis not present

## 2022-04-24 DIAGNOSIS — E1140 Type 2 diabetes mellitus with diabetic neuropathy, unspecified: Secondary | ICD-10-CM | POA: Diagnosis not present

## 2022-04-24 DIAGNOSIS — G250 Essential tremor: Secondary | ICD-10-CM | POA: Diagnosis not present

## 2022-04-24 DIAGNOSIS — N182 Chronic kidney disease, stage 2 (mild): Secondary | ICD-10-CM | POA: Diagnosis not present

## 2022-05-08 DIAGNOSIS — M79672 Pain in left foot: Secondary | ICD-10-CM | POA: Diagnosis not present

## 2022-05-08 DIAGNOSIS — M79671 Pain in right foot: Secondary | ICD-10-CM | POA: Diagnosis not present

## 2022-05-11 ENCOUNTER — Encounter: Payer: Self-pay | Admitting: Radiology

## 2022-05-15 ENCOUNTER — Telehealth: Payer: Self-pay | Admitting: Gastroenterology

## 2022-05-15 NOTE — Telephone Encounter (Signed)
Given his age, over 18, and only 2 small adenomas on his last colonoscopy in 2018 he may not need another surveillance colonoscopy. I recommend that he discontinues colonoscopy however if he would like to further discuss please schedule an office appt.

## 2022-05-15 NOTE — Telephone Encounter (Signed)
Good Afternoon Dr. Fuller Plan,  I have received a call from this patient wishing to schedule his recall colonoscopy. Patient has had office visits and procedures with [facility] since he was last seen with you here. He is not wishing to continue care with [facility], but would like to continue his care with you. Records are available to view via Care Everywhere, please review at your earliest convenience and advise on scheduling.   Thank You!

## 2022-05-16 NOTE — Telephone Encounter (Signed)
Spoke with pt wife, she will have him call back and schedule OV if he would like to further discuss

## 2022-05-18 ENCOUNTER — Encounter: Payer: Self-pay | Admitting: "Endocrinology

## 2022-05-18 ENCOUNTER — Ambulatory Visit (INDEPENDENT_AMBULATORY_CARE_PROVIDER_SITE_OTHER): Payer: PPO | Admitting: "Endocrinology

## 2022-05-18 VITALS — BP 130/64 | HR 60 | Ht 73.0 in | Wt 200.8 lb

## 2022-05-18 DIAGNOSIS — E782 Mixed hyperlipidemia: Secondary | ICD-10-CM | POA: Diagnosis not present

## 2022-05-18 DIAGNOSIS — Z794 Long term (current) use of insulin: Secondary | ICD-10-CM | POA: Diagnosis not present

## 2022-05-18 DIAGNOSIS — N1831 Chronic kidney disease, stage 3a: Secondary | ICD-10-CM

## 2022-05-18 DIAGNOSIS — I10 Essential (primary) hypertension: Secondary | ICD-10-CM

## 2022-05-18 DIAGNOSIS — E1122 Type 2 diabetes mellitus with diabetic chronic kidney disease: Secondary | ICD-10-CM

## 2022-05-18 LAB — POCT GLYCOSYLATED HEMOGLOBIN (HGB A1C): HbA1c, POC (controlled diabetic range): 6.9 % (ref 0.0–7.0)

## 2022-05-18 MED ORDER — NOVOLIN 70/30 (70-30) 100 UNIT/ML ~~LOC~~ SUSP
SUBCUTANEOUS | 2 refills | Status: DC
Start: 2022-05-18 — End: 2022-09-19

## 2022-05-18 NOTE — Progress Notes (Signed)
05/18/2022  Endocrinology follow-up note  Subjective:    Patient ID: Hector Rose, male    DOB: 10/13/1940,    Past Medical History:  Diagnosis Date   Asthma 04-11-11   AS CHILD ONLY   BPH (benign prostatic hypertrophy) with urinary obstruction    Carpal tunnel syndrome    patient denies at 05/14/17 preop visit    Difficult intubation 04-11-11   08-11-97 some issues with intubation/record with chart   DJD (degenerative joint disease)    DM (diabetes mellitus) ([place]) 04-11-11   tyep II    DVT femoral (deep venous thrombosis) with thrombophlebitis ([place]) 04-11-11   ?'09-tx. on Xarelton x 3-4 years    GERD (gastroesophageal reflux disease) 04-11-11   mild, no med in 1 month   Gout 04-11-11    ankle 2 yrs ago   Hemorrhoids    Hypercholesteremia    Hypertension    Internal hemorrhoids    Keloid 04-11-11   multiple-arms,back, chest   Pulmonary nodule 04-11-11   "PT. NOT AWARE" -denies problems with breathing   Renal calculus    Thyroid disease    hypothyroid   Past Surgical History:  Procedure Laterality Date   CATARACT EXTRACTION W/PHACO Left 10/28/2021   Procedure: CATARACT EXTRACTION PHACO AND INTRAOCULAR LENS PLACEMENT ([place]);  Surgeon: Baruch Goldmann, MD;  Location: [facility];  Service: Ophthalmology;  Laterality: Left;  CDE: 6.61   cataract surgery  12/2013   COLONOSCOPY WITH PROPOFOL N/A 02/05/2017   Procedure: COLONOSCOPY WITH PROPOFOL;  Surgeon: Ladene Artist, MD;  Location: [facility];  Service: Endoscopy;  Laterality: N/A;   COSMETIC SURGERY  2000   Keloid injections   CYSTOSCOPY WITH BIOPSY  04/14/2011   Procedure: CYSTOSCOPY WITH BIOPSY;  Surgeon: Dutch Gray, MD;  Location: [facility];  Service: Urology;  Laterality: N/A;  Bladder Biopsies    CYSTOSCOPY/RETROGRADE/URETEROSCOPY  04/14/2011   Procedure: CYSTOSCOPY/RETROGRADE/URETEROSCOPY;  Surgeon: Dutch Gray, MD;  Location: [facility];  Service: Urology;  Laterality: Bilateral;  ([place]) RPG    LAPAROSCOPIC NEPHRECTOMY Right 05/21/2017    Procedure: LAPAROSCOPIC RADICAL NEPHRECTOMY;  Surgeon: Raynelle Bring, MD;  Location: [facility];  Service: Urology;  Laterality: Right;   ortho surgery on fingers  2005 & 2008   Dr. Lorelle Formosa finger release   PROSTATECTOMY  1999   Dr. Rosana Hoes   Social History   Socioeconomic History   Marital status: Married    Spouse name: Not on file   Number of children: 2   Years of education: Not on file   Highest education level: Not on file  Occupational History   Occupation: retired from [employer] Use   Smoking status: Never   Smokeless tobacco: Never  Vaping Use   Vaping Use: Never used  Substance and Sexual Activity   Alcohol use: No    Alcohol/week: 0.0 standard drinks of alcohol   Drug use: No   Sexual activity: Yes  Other Topics Concern   Not on file  Social History Narrative   Not on file   Social Determinants of Health   Financial Resource Strain: Not on file  Food Insecurity: Not on file  Transportation Needs: Not on file  Physical Activity: Not on file  Stress: Not on file  Social Connections: Not on file   Outpatient Encounter Medications as of 05/18/2022  Medication Sig   primidone (MYSOLINE) 50 MG tablet Take 25 mg by mouth daily.   allopurinol (ZYLOPRIM) 300 MG tablet TAKE 1 TABLET BY MOUTH ONCE DAILY (  Patient taking differently: daily as needed. TAKE 1 TABLET BY MOUTH ONCE DAILY AS NEEDED)   amLODipine (NORVASC) 10 MG tablet TAKE 1 TABLET EVERY MORNING   aspirin EC 81 MG tablet Take 81 mg by mouth daily. Swallow whole.   benzonatate (TESSALON) 100 MG capsule Take 1 capsule (100 mg total) by mouth 3 (three) times daily as needed for cough. Do not take with alcohol or while driving or operating heavy machinery.  May cause drowsiness.   Blood Glucose Monitoring Suppl (ACCU-CHEK GUIDE ME) w/Device KIT 1 Piece by Does not apply route as directed.   Continuous Blood Gluc Receiver (FREESTYLE LIBRE 2 READER) DEVI As directed   Continuous Blood Gluc Sensor  (FREESTYLE LIBRE 2 SENSOR) MISC APPLY NEW SENSOR EVERY 14 (FOURTEEN) DAYS.   diphenhydrAMINE (BENADRYL) 25 MG tablet Take 25 mg by mouth daily as needed for allergies.   donepezil (ARICEPT) 5 MG tablet donepezil 5 mg tablet  TAKE 1 TABLET BY MOUTH EVERYDAY AT BEDTIME   fluticasone (FLONASE) 50 MCG/ACT nasal spray Place 2 sprays daily as needed into both nostrils for allergies or rhinitis.   gabapentin (NEURONTIN) 600 MG tablet Take 600-1,200 mg by mouth See admin instructions. Take 1200 mg in the morning and 600 mg at night   hydrochlorothiazide (HYDRODIURIL) 25 MG tablet Take 1 tablet by mouth daily.   hydrochlorothiazide (HYDRODIURIL) 25 MG tablet TAKE 1 TABLET BY MOUTH EVERY DAY   insulin NPH-regular Human (NOVOLIN 70/30) (70-30) 100 UNIT/ML injection Inject 10 units with breakfast, 10 units with supper when premeal blood glucose readings above 90 mg per DL .   Insulin Syringe-Needle U-100 (BD VEO INSULIN SYRINGE U/F) 31G X 15/64" 1 ML MISC USE TWICE A DAY  To inject insulin   Lancets (ONETOUCH DELICA PLUS 123XX123) MISC USE TO TEST BLOOD SUGAR TWICE DAILY.   metoprolol tartrate (LOPRESSOR) 25 MG tablet Take 25 mg by mouth 2 (two) times daily.   omeprazole (PRILOSEC) 20 MG capsule Take 1 capsule (20 mg total) by mouth daily. (Patient taking differently: Take 20 mg by mouth daily as needed.)   ONETOUCH ULTRA test strip USE TO TEST BLOOD SUGAR TWICE DAILY   predniSONE (DELTASONE) 10 MG tablet Take by mouth.   Propylene Glycol (SYSTANE BALANCE OP) Apply 1 drop as needed to eye (irritation).   Rivaroxaban (XARELTO) 15 MG TABS tablet Take 15 mg by mouth daily.    [DISCONTINUED] insulin NPH-regular Human (NOVOLIN 70/30) (70-30) 100 UNIT/ML injection Inject 20 units with breakfast, 20 units with supper when premeal blood glucose readings above 90 mg per DL . (Patient taking differently: 10-12 Units 2 (two) times daily with a meal. Inject 20 units with breakfast, 20 units with supper when premeal blood  glucose readings above 90 mg per DL .)   [DISCONTINUED] simvastatin (ZOCOR) 40 MG tablet Takes 1/2 tablet daily bedtime   No facility-administered encounter medications on file as of 05/18/2022.   ALLERGIES: Allergies  Allergen Reactions   Ace Inhibitors Swelling    REACTION: angio edema (swelling of lips)   Allopurinol    Metformin And Related     Itching in higher doses    VACCINATION STATUS: Immunization History  Administered Date(s) Administered   Influenza Split 11/27/2011, 12/30/2012, 01/01/2014, 03/13/2017   Influenza Whole 01/27/2009, 11/30/2009, 11/17/2010   Influenza, High Dose Seasonal PF 12/21/2015, 01/04/2018   Pneumococcal Conjugate-13 02/04/2018   Pneumococcal Polysaccharide-23 12/02/2008    Diabetes He presents for his follow-up diabetic visit. He has type 2 diabetes mellitus.  Onset time: Diagnosed approximately  at age 80. His disease course has been improving. Pertinent negatives for hypoglycemia include no confusion, headaches, pallor or seizures. Pertinent negatives for diabetes include no chest pain, no fatigue, no polydipsia, no polyphagia, no polyuria and no weakness. Symptoms are improving. Diabetic complications include nephropathy. Risk factors for coronary artery disease include diabetes mellitus, dyslipidemia, male sex, hypertension and sedentary lifestyle. Current diabetic treatment includes insulin injections. His weight is fluctuating minimally. He is following a generally unhealthy diet. Meal planning includes ADA exchanges. He participates in exercise intermittently. His home blood glucose trend is decreasing steadily. His breakfast blood glucose range is generally 140-180 mg/dl. His lunch blood glucose range is generally 140-180 mg/dl. His dinner blood glucose range is generally 140-180 mg/dl. His bedtime blood glucose range is generally 140-180 mg/dl. His overall blood glucose range is 140-180 mg/dl. (He presents with significantly improving glycemic profile.   He has obtained a CGM device .  He presents with his device showing 64% time in range, 24% level 1 hyperglycemia, 11% level 2 hyperglycemia.  No sustained hypoglycemia.  His point-of-care A1c is 6.9%, improving from 8.1% during his last visit.    ) An ACE inhibitor/angiotensin II receptor blocker is contraindicated.  Hypertension This is a chronic problem. The current episode started more than 1 year ago. The problem is controlled. Pertinent negatives include no chest pain, headaches, neck pain, palpitations or shortness of breath. Risk factors for coronary artery disease include dyslipidemia, diabetes mellitus, male gender and sedentary lifestyle. Past treatments include calcium channel blockers.  Hyperlipidemia This is a chronic problem. The current episode started more than 1 year ago. The problem is uncontrolled. Exacerbating diseases include diabetes. Pertinent negatives include no chest pain, myalgias or shortness of breath. Current antihyperlipidemic treatment includes bile acid squestrants. Risk factors for coronary artery disease include dyslipidemia and diabetes mellitus.    Review of systems Limited as above.   Objective:    BP 130/64   Pulse 60   Ht '6\' 1"'$  (1.854 m)   Wt 200 lb 12.8 oz (91.1 kg)   BMI 26.49 kg/m   Wt Readings from Last 3 Encounters:  05/18/22 200 lb 12.8 oz (91.1 kg)  01/17/22 201 lb 12.8 oz (91.5 kg)  10/25/21 201 lb 1 oz (91.2 kg)      Recent Results (from the past 2160 hour(s))  SARS CORONAVIRUS 2 (TAT 6-24 HRS) Anterior Nasal Swab     Status: None   Collection Time: 03/13/22  1:31 PM   Specimen: Anterior Nasal Swab  Result Value Ref Range   SARS Coronavirus 2 NEGATIVE NEGATIVE    Comment: (NOTE) SARS-CoV-2 target nucleic acids are NOT DETECTED.  The SARS-CoV-2 RNA is generally detectable in upper and lower respiratory specimens during the acute phase of infection. Negative results do not preclude SARS-CoV-2 infection, do not rule  out co-infections with other pathogens, and should not be used as the sole basis for treatment or other patient management decisions. Negative results must be combined with clinical observations, patient history, and epidemiological information. The expected result is Negative.  Fact Sheet for Patients: SugarRoll.be  Fact Sheet for Healthcare Providers: https://www.woods-mathews.com/  This test is not yet approved or cleared by the [place] FDA and  has been authorized for detection and/or diagnosis of SARS-CoV-2 by FDA under an Emergency Use Authorization (EUA). This EUA will remain  in effect (meaning this test can be used) for the duration of the COVID-19 declaration under Se ction 564(b)(1) of the  Act, 21 U.S.C. section 360bbb-3(b)(1), unless the authorization is terminated or revoked sooner.  Performed at [facility], [place] [address], [place], [place] [address]   HgB A1c     Status: None   Collection Time: 05/18/22 10:39 AM  Result Value Ref Range   Hemoglobin A1C     HbA1c POC (<> result, manual entry)     HbA1c, POC (prediabetic range)     HbA1c, POC (controlled diabetic range) 6.9 0.0 - 7.0 %   Lipid Panel     Component Value Date/Time   CHOL 168 01/12/2022 0926   TRIG 176 (H) 01/12/2022 0926   HDL 52 01/12/2022 0926   CHOLHDL 3.2 01/12/2022 0926   CHOLHDL 4.8 11/20/2017 0745   VLDL 28 03/17/2016 0714   LDLCALC 86 01/12/2022 0926   LDLCALC 152 (H) 11/20/2017 0745   LDLDIRECT 147.5 06/10/2012 0930     Assessment & Plan:   1. Type 2 diabetes mellitus with stage 2 chronic kidney disease  He presents with worsening glycemic profile due to the fact that he missed at least a third of his insulin injection opportunities. He presents with significantly improving glycemic profile.  He has obtained a CGM device .  He presents with his device showing 64% time in range, 24% level 1 hyperglycemia, 11% level 2  hyperglycemia.  No sustained hypoglycemia.  His point-of-care A1c is 6.9%, improving from 8.1% during his last visit.    Patient is advised to stick to the recommended insulin dose and a routine mealtimes to eat 3 meals  a day and avoid unnecessary snacks ( to snack only to correct hypoglycemia). - he acknowledges that there is a room for improvement in his food and drink choices. - Suggestion is made for him to avoid simple carbohydrates  from his diet including Cakes, Sweet Desserts, Ice Cream, Soda (diet and regular), Sweet Tea, Candies, Chips, Cookies, Store Bought Juices, Alcohol in Excess of  1-2 drinks a day, Artificial Sweeteners,  Coffee Creamer, and "Sugar-free" Products, Lemonade. This will help patient to have more stable blood glucose profile and potentially avoid unintended weight gain.  -He worries about hypoglycemia, and he is at risk for hypoglycemia from inadvertent use of insulin.  Priority in his care would be to avoid hypoglycemia.    -In light of his presentation with significantly improved glycemic profile and interval adjustment of his insulin to 12 units twice daily, I advised him to continue Novolin 70/30 to 10 units with breakfast and 10 units with supper  only when his Premeal blood glucose readings are above 90 mg/day.    - He is advised to continue glucose monitoring at least 2 times daily-before breakfast and at supper and anytime as needed. - Patient is instructed to call back with extremes of blood glucose readings  less than 70 or greater than 200 mg/dl. He is benefiting, and advised to continue using his CGM device. -He is not a candidate for Metformin, SGLT2 inhibitors, nor incretin therapy.   -I discussed and prescribed the freestyle libre device for him.   2. Mixed hyperlipidemia - His repeat lipid panel showed significant improvement in his LDL improving to 86 from 166.  He is benefiting and tolerating Crestor.  He is advised to continue Crestor 20 mg p.o.  nightly.  Side effects and precautions discussed with him.     - He is advised to continue with omega-3 fatty acids ,avoid butter and fried food and exercise regularly.  3. Essential hypertension  -  His blood pressure is controlled to target.   He is currently on amlodipine 10 mg p.o. daily, metoprolol 50 mg daily.  I discussed and added hydrochlorothiazide 25 mg p.o. daily at breakfast for better control of hypertension.    4) vitamin D deficiency -He is on ongoing supplement with vitamin D3  5000 units daily for the next 90 days.   5) multinodular goiter: -She was recently seen for work-up of multinodular goiter.  It was observed that he has had stable euthyroid multinodular goiter documented for the last 7 years.  He will not need biopsy or surgical intervention for this at this time.  7 years of stability makes it unnecessary to do another thyroid imaging.  His recent thyroid function test and within normal state, he will need yearly thyroid function test.  His recent screening ABI was negative for PAD on June 03, 2020.  His neck study will be repeated in March 2027, or sooner if needed.   He is advised to continue close follow-up with his PCP Dr. Grier Mitts.   I spent  26  minutes in the care of the patient today including review of labs from [place], Lipids, Thyroid Function, Hematology (current and previous including abstractions from other facilities); face-to-face time discussing  his blood glucose readings/logs, discussing hypoglycemia and hyperglycemia episodes and symptoms, medications doses, his options of short and long term treatment based on the latest standards of care / guidelines;  discussion about incorporating lifestyle medicine;  and documenting the encounter. Risk reduction counseling performed per USPSTF guidelines to reduce cardiovascular risk factors.     Please refer to Patient Instructions for Blood Glucose Monitoring and Insulin/Medications Dosing Guide"  in  media tab for additional information. Please  also refer to " Patient Self Inventory" in the Media  tab for reviewed elements of pertinent patient history.  Hector Rose participated in the discussions, expressed understanding, and voiced agreement with the above plans.  All questions were answered to his satisfaction. he is encouraged to contact clinic should he have any questions or concerns prior to his return visit.    Follow up plan: Return in about 4 months (around 09/17/2022) for F/U with Pre-visit Labs, Meter/CGM/Logs, A1c here.  Glade Lloyd, MD Phone: [phone]  Fax: [phone]  -  This note was partially dictated with voice recognition software. Similar sounding words can be transcribed inadequately or may not  be corrected upon review.  05/18/2022, 11:58 AM

## 2022-05-18 NOTE — Patient Instructions (Signed)

## 2022-06-01 DIAGNOSIS — G250 Essential tremor: Secondary | ICD-10-CM | POA: Diagnosis not present

## 2022-06-01 DIAGNOSIS — E663 Overweight: Secondary | ICD-10-CM | POA: Diagnosis not present

## 2022-06-01 DIAGNOSIS — E1122 Type 2 diabetes mellitus with diabetic chronic kidney disease: Secondary | ICD-10-CM | POA: Diagnosis not present

## 2022-06-01 DIAGNOSIS — E1140 Type 2 diabetes mellitus with diabetic neuropathy, unspecified: Secondary | ICD-10-CM | POA: Diagnosis not present

## 2022-06-01 DIAGNOSIS — N182 Chronic kidney disease, stage 2 (mild): Secondary | ICD-10-CM | POA: Diagnosis not present

## 2022-06-01 DIAGNOSIS — Z6828 Body mass index (BMI) 28.0-28.9, adult: Secondary | ICD-10-CM | POA: Diagnosis not present

## 2022-06-01 DIAGNOSIS — C679 Malignant neoplasm of bladder, unspecified: Secondary | ICD-10-CM | POA: Diagnosis not present

## 2022-06-19 ENCOUNTER — Other Ambulatory Visit: Payer: Self-pay | Admitting: "Endocrinology

## 2022-06-26 DIAGNOSIS — E1140 Type 2 diabetes mellitus with diabetic neuropathy, unspecified: Secondary | ICD-10-CM | POA: Diagnosis not present

## 2022-06-26 DIAGNOSIS — E6609 Other obesity due to excess calories: Secondary | ICD-10-CM | POA: Diagnosis not present

## 2022-06-26 DIAGNOSIS — M1991 Primary osteoarthritis, unspecified site: Secondary | ICD-10-CM | POA: Diagnosis not present

## 2022-06-26 DIAGNOSIS — Z6828 Body mass index (BMI) 28.0-28.9, adult: Secondary | ICD-10-CM | POA: Diagnosis not present

## 2022-06-30 ENCOUNTER — Other Ambulatory Visit: Payer: Self-pay | Admitting: Urology

## 2022-06-30 DIAGNOSIS — Z85528 Personal history of other malignant neoplasm of kidney: Secondary | ICD-10-CM

## 2022-06-30 DIAGNOSIS — D49512 Neoplasm of unspecified behavior of left kidney: Secondary | ICD-10-CM

## 2022-07-23 DIAGNOSIS — M109 Gout, unspecified: Secondary | ICD-10-CM | POA: Diagnosis not present

## 2022-07-26 ENCOUNTER — Other Ambulatory Visit: Payer: Self-pay | Admitting: "Endocrinology

## 2022-07-26 DIAGNOSIS — N1831 Chronic kidney disease, stage 3a: Secondary | ICD-10-CM

## 2022-08-02 DIAGNOSIS — M109 Gout, unspecified: Secondary | ICD-10-CM | POA: Diagnosis not present

## 2022-08-02 DIAGNOSIS — E6609 Other obesity due to excess calories: Secondary | ICD-10-CM | POA: Diagnosis not present

## 2022-08-02 DIAGNOSIS — M7022 Olecranon bursitis, left elbow: Secondary | ICD-10-CM | POA: Diagnosis not present

## 2022-08-02 DIAGNOSIS — Z6828 Body mass index (BMI) 28.0-28.9, adult: Secondary | ICD-10-CM | POA: Diagnosis not present

## 2022-08-08 DIAGNOSIS — M7021 Olecranon bursitis, right elbow: Secondary | ICD-10-CM | POA: Diagnosis not present

## 2022-08-08 DIAGNOSIS — E663 Overweight: Secondary | ICD-10-CM | POA: Diagnosis not present

## 2022-08-08 DIAGNOSIS — Z6827 Body mass index (BMI) 27.0-27.9, adult: Secondary | ICD-10-CM | POA: Diagnosis not present

## 2022-08-21 ENCOUNTER — Ambulatory Visit
Admission: RE | Admit: 2022-08-21 | Discharge: 2022-08-21 | Disposition: A | Discharge status: Home / Self Care | Payer: PPO | Source: Ambulatory Visit | Attending: Urology | Admitting: Urology

## 2022-08-21 DIAGNOSIS — D49512 Neoplasm of unspecified behavior of left kidney: Secondary | ICD-10-CM

## 2022-08-21 DIAGNOSIS — K869 Disease of pancreas, unspecified: Secondary | ICD-10-CM | POA: Diagnosis not present

## 2022-08-21 DIAGNOSIS — N281 Cyst of kidney, acquired: Secondary | ICD-10-CM | POA: Diagnosis not present

## 2022-08-21 DIAGNOSIS — Z85528 Personal history of other malignant neoplasm of kidney: Secondary | ICD-10-CM

## 2022-08-21 MED ORDER — GADOPICLENOL 0.5 MMOL/ML IV SOLN
10.0000 mL | Freq: Once | INTRAVENOUS | Status: AC | PRN
  Administered 2022-08-21: 10 mL via INTRAVENOUS

## 2022-08-22 ENCOUNTER — Other Ambulatory Visit (HOSPITAL_COMMUNITY): Payer: Self-pay | Admitting: Urology

## 2022-08-22 ENCOUNTER — Ambulatory Visit (HOSPITAL_COMMUNITY)
Admission: RE | Admit: 2022-08-22 | Discharge: 2022-08-22 | Disposition: A | Discharge status: Home / Self Care | Payer: PPO | Source: Ambulatory Visit | Attending: Urology | Admitting: Urology

## 2022-08-22 DIAGNOSIS — Z85528 Personal history of other malignant neoplasm of kidney: Secondary | ICD-10-CM | POA: Insufficient documentation

## 2022-08-22 DIAGNOSIS — J45909 Unspecified asthma, uncomplicated: Secondary | ICD-10-CM | POA: Diagnosis not present

## 2022-08-22 DIAGNOSIS — I10 Essential (primary) hypertension: Secondary | ICD-10-CM | POA: Diagnosis not present

## 2022-08-29 DIAGNOSIS — D49512 Neoplasm of unspecified behavior of left kidney: Secondary | ICD-10-CM | POA: Diagnosis not present

## 2022-08-29 DIAGNOSIS — Z85528 Personal history of other malignant neoplasm of kidney: Secondary | ICD-10-CM | POA: Diagnosis not present

## 2022-09-05 DIAGNOSIS — E1140 Type 2 diabetes mellitus with diabetic neuropathy, unspecified: Secondary | ICD-10-CM | POA: Diagnosis not present

## 2022-09-07 DIAGNOSIS — J069 Acute upper respiratory infection, unspecified: Secondary | ICD-10-CM | POA: Diagnosis not present

## 2022-09-07 DIAGNOSIS — Z6827 Body mass index (BMI) 27.0-27.9, adult: Secondary | ICD-10-CM | POA: Diagnosis not present

## 2022-09-07 DIAGNOSIS — R6889 Other general symptoms and signs: Secondary | ICD-10-CM | POA: Diagnosis not present

## 2022-09-07 DIAGNOSIS — J302 Other seasonal allergic rhinitis: Secondary | ICD-10-CM | POA: Diagnosis not present

## 2022-09-07 DIAGNOSIS — E663 Overweight: Secondary | ICD-10-CM | POA: Diagnosis not present

## 2022-09-07 DIAGNOSIS — Z20828 Contact with and (suspected) exposure to other viral communicable diseases: Secondary | ICD-10-CM | POA: Diagnosis not present

## 2022-09-08 DIAGNOSIS — M1711 Unilateral primary osteoarthritis, right knee: Secondary | ICD-10-CM | POA: Diagnosis not present

## 2022-09-08 DIAGNOSIS — M25521 Pain in right elbow: Secondary | ICD-10-CM | POA: Diagnosis not present

## 2022-09-11 DIAGNOSIS — M7021 Olecranon bursitis, right elbow: Secondary | ICD-10-CM | POA: Diagnosis not present

## 2022-09-13 DIAGNOSIS — E1122 Type 2 diabetes mellitus with diabetic chronic kidney disease: Secondary | ICD-10-CM | POA: Diagnosis not present

## 2022-09-13 DIAGNOSIS — Z794 Long term (current) use of insulin: Secondary | ICD-10-CM | POA: Diagnosis not present

## 2022-09-13 DIAGNOSIS — N1831 Chronic kidney disease, stage 3a: Secondary | ICD-10-CM | POA: Diagnosis not present

## 2022-09-14 LAB — COMPREHENSIVE METABOLIC PANEL
ALT: 17 IU/L (ref 0–44)
AST: 16 IU/L (ref 0–40)
Albumin: 4.1 g/dL (ref 3.7–4.7)
Alkaline Phosphatase: 92 IU/L (ref 44–121)
BUN/Creatinine Ratio: 14 (ref 10–24)
BUN: 28 mg/dL — ABNORMAL HIGH (ref 8–27)
Bilirubin Total: 0.4 mg/dL (ref 0.0–1.2)
CO2: 29 mmol/L (ref 20–29)
Calcium: 9.7 mg/dL (ref 8.6–10.2)
Chloride: 97 mmol/L (ref 96–106)
Creatinine, Ser: 1.97 mg/dL — ABNORMAL HIGH (ref 0.76–1.27)
Globulin, Total: 2.7 g/dL (ref 1.5–4.5)
Glucose: 198 mg/dL — ABNORMAL HIGH (ref 70–99)
Potassium: 4.5 mmol/L (ref 3.5–5.2)
Sodium: 141 mmol/L (ref 134–144)
Total Protein: 6.8 g/dL (ref 6.0–8.5)
eGFR: 34 mL/min/{1.73_m2} — ABNORMAL LOW (ref >59)

## 2022-09-19 ENCOUNTER — Encounter: Payer: Self-pay | Admitting: "Endocrinology

## 2022-09-19 ENCOUNTER — Ambulatory Visit (INDEPENDENT_AMBULATORY_CARE_PROVIDER_SITE_OTHER): Payer: PPO | Admitting: "Endocrinology

## 2022-09-19 VITALS — BP 94/56 | HR 56 | Ht 73.0 in | Wt 196.6 lb

## 2022-09-19 DIAGNOSIS — I10 Essential (primary) hypertension: Secondary | ICD-10-CM

## 2022-09-19 DIAGNOSIS — E782 Mixed hyperlipidemia: Secondary | ICD-10-CM

## 2022-09-19 DIAGNOSIS — Z794 Long term (current) use of insulin: Secondary | ICD-10-CM

## 2022-09-19 DIAGNOSIS — N1831 Chronic kidney disease, stage 3a: Secondary | ICD-10-CM | POA: Diagnosis not present

## 2022-09-19 DIAGNOSIS — E1122 Type 2 diabetes mellitus with diabetic chronic kidney disease: Secondary | ICD-10-CM | POA: Diagnosis not present

## 2022-09-19 LAB — POCT GLYCOSYLATED HEMOGLOBIN (HGB A1C): HbA1c, POC (controlled diabetic range): 8 % — AB (ref 0.0–7.0)

## 2022-09-19 MED ORDER — NOVOLIN 70/30 (70-30) 100 UNIT/ML ~~LOC~~ SUSP
SUBCUTANEOUS | 2 refills | Status: DC
Start: 2022-09-19 — End: 2022-12-21

## 2022-09-19 NOTE — Progress Notes (Signed)
09/19/2022  Endocrinology follow-up note  Subjective:    Patient ID: Hector Rose, male    DOB: [birth year],    Past Medical History:  Diagnosis Date   Asthma 04-11-11   AS CHILD ONLY   BPH (benign prostatic hypertrophy) with urinary obstruction    Carpal tunnel syndrome    patient denies at 05/14/17 preop visit    Difficult intubation 04-11-11   08-11-97 some issues with intubation/record with chart   DJD (degenerative joint disease)    DM (diabetes mellitus) (HCC) 04-11-11   tyep II    DVT femoral (deep venous thrombosis) with thrombophlebitis (HCC) 04-11-11   ?'09-tx. on Xarelton x 3-4 years    GERD (gastroesophageal reflux disease) 04-11-11   mild, no med in 1 month   Gout 04-11-11    ankle 2 yrs ago   Hemorrhoids    Hypercholesteremia    Hypertension    Internal hemorrhoids    Keloid 04-11-11   multiple-arms,back, chest   Pulmonary nodule 04-11-11   "PT. NOT AWARE" -denies problems with breathing   Renal calculus    Thyroid disease    hypothyroid   Past Surgical History:  Procedure Laterality Date   CATARACT EXTRACTION W/PHACO Left 10/28/2021   Procedure: CATARACT EXTRACTION PHACO AND INTRAOCULAR LENS PLACEMENT (IOC);  Surgeon: Fabio Pierce, MD;  Location: [facility];  Service: Ophthalmology;  Laterality: Left;  CDE: 6.61   cataract surgery  12/2013   COLONOSCOPY WITH PROPOFOL N/A 02/05/2017   Procedure: COLONOSCOPY WITH PROPOFOL;  Surgeon: Meryl Dare, MD;  Location: [facility];  Service: Endoscopy;  Laterality: N/A;   COSMETIC SURGERY  2000   Keloid injections   CYSTOSCOPY WITH BIOPSY  04/14/2011   Procedure: CYSTOSCOPY WITH BIOPSY;  Surgeon: Crecencio Mc, MD;  Location: [facility];  Service: Urology;  Laterality: N/A;  Bladder Biopsies    CYSTOSCOPY/RETROGRADE/URETEROSCOPY  04/14/2011   Procedure: CYSTOSCOPY/RETROGRADE/URETEROSCOPY;  Surgeon: Crecencio Mc, MD;  Location: [facility];  Service: Urology;  Laterality: Bilateral;  (Bil) RPG    LAPAROSCOPIC NEPHRECTOMY Right 05/21/2017    Procedure: LAPAROSCOPIC RADICAL NEPHRECTOMY;  Surgeon: Heloise Purpura, MD;  Location: [facility];  Service: Urology;  Laterality: Right;   ortho surgery on fingers  2005 & 2008   Dr. Marletta Lor finger release   PROSTATECTOMY  1999   Dr. Earlene Plater   Social History   Socioeconomic History   Marital status: Married    Spouse name: Not on file   Number of children: 2   Years of education: Not on file   Highest education level: Not on file  Occupational History   Occupation: retired from [employer]  Tobacco Use   Smoking status: Never   Smokeless tobacco: Never  Vaping Use   Vaping Use: Never used  Substance and Sexual Activity   Alcohol use: No    Alcohol/week: 0.0 standard drinks of alcohol   Drug use: No   Sexual activity: Yes  Other Topics Concern   Not on file  Social History Narrative   Not on file   Social Determinants of Health   Financial Resource Strain: Not on file  Food Insecurity: Not on file  Transportation Needs: Not on file  Physical Activity: Not on file  Stress: Not on file  Social Connections: Not on file   Outpatient Encounter Medications as of 09/19/2022  Medication Sig   allopurinol (ZYLOPRIM) 300 MG tablet TAKE 1 TABLET BY MOUTH ONCE DAILY (Patient taking differently: daily as needed. TAKE 1 TABLET BY MOUTH ONCE DAILY  AS NEEDED)   amLODipine (NORVASC) 10 MG tablet TAKE 1 TABLET EVERY MORNING   aspirin EC 81 MG tablet Take 81 mg by mouth daily. Swallow whole.   benzonatate (TESSALON) 100 MG capsule Take 1 capsule (100 mg total) by mouth 3 (three) times daily as needed for cough. Do not take with alcohol or while driving or operating heavy machinery.  May cause drowsiness.   Blood Glucose Monitoring Suppl (ACCU-CHEK GUIDE ME) w/Device KIT 1 Piece by Does not apply route as directed.   Continuous Blood Gluc Receiver (FREESTYLE LIBRE 2 READER) DEVI As directed   Continuous Glucose Sensor (FREESTYLE LIBRE 2 SENSOR) MISC APPLY NEW SENSOR EVERY 14 (FOURTEEN)  DAYS.   diphenhydrAMINE (BENADRYL) 25 MG tablet Take 25 mg by mouth daily as needed for allergies.   donepezil (ARICEPT) 5 MG tablet donepezil 5 mg tablet  TAKE 1 TABLET BY MOUTH EVERYDAY AT BEDTIME   fluticasone (FLONASE) 50 MCG/ACT nasal spray Place 2 sprays daily as needed into both nostrils for allergies or rhinitis.   gabapentin (NEURONTIN) 600 MG tablet Take 600-1,200 mg by mouth See admin instructions. Take 1200 mg in the morning and 600 mg at night   hydrochlorothiazide (HYDRODIURIL) 25 MG tablet Take 1 tablet by mouth daily.   hydrochlorothiazide (HYDRODIURIL) 25 MG tablet TAKE 1 TABLET BY MOUTH EVERY DAY   insulin NPH-regular Human (NOVOLIN 70/30) (70-30) 100 UNIT/ML injection Inject 12 units with breakfast, 12 units with supper when premeal blood glucose readings above 90 mg per DL .   Insulin Syringe-Needle U-100 (BD VEO INSULIN SYRINGE U/F) 31G X 15/64" 1 ML MISC USE TWICE A DAY  To inject insulin   Lancets (ONETOUCH DELICA PLUS LANCET33G) MISC USE TO TEST BLOOD SUGAR TWICE DAILY.   metoprolol tartrate (LOPRESSOR) 25 MG tablet Take 25 mg by mouth 2 (two) times daily.   omeprazole (PRILOSEC) 20 MG capsule Take 1 capsule (20 mg total) by mouth daily. (Patient taking differently: Take 20 mg by mouth daily as needed.)   ONETOUCH ULTRA test strip USE TO TEST BLOOD SUGAR TWICE DAILY   predniSONE (DELTASONE) 10 MG tablet Take by mouth.   primidone (MYSOLINE) 50 MG tablet Take 25 mg by mouth daily.   Propylene Glycol (SYSTANE BALANCE OP) Apply 1 drop as needed to eye (irritation).   Rivaroxaban (XARELTO) 15 MG TABS tablet Take 15 mg by mouth daily.    [DISCONTINUED] insulin NPH-regular Human (NOVOLIN 70/30) (70-30) 100 UNIT/ML injection Inject 10 units with breakfast, 10 units with supper when premeal blood glucose readings above 90 mg per DL .   [DISCONTINUED] simvastatin (ZOCOR) 40 MG tablet Takes 1/2 tablet daily bedtime   No facility-administered encounter medications on file as of  09/19/2022.   ALLERGIES: Allergies  Allergen Reactions   Ace Inhibitors Swelling    REACTION: angio edema (swelling of lips)   Allopurinol    Metformin And Related     Itching in higher doses    VACCINATION STATUS: Immunization History  Administered Date(s) Administered   Influenza Split 11/27/2011, 12/30/2012, 01/01/2014, 03/13/2017   Influenza Whole 01/27/2009, 11/30/2009, 11/17/2010   Influenza, High Dose Seasonal PF 12/21/2015, 01/04/2018   Pneumococcal Conjugate-13 02/04/2018   Pneumococcal Polysaccharide-23 12/02/2008    Diabetes He presents for his follow-up diabetic visit. He has type 2 diabetes mellitus. Onset time: Diagnosed approximately  at age 76. His disease course has been worsening. Pertinent negatives for hypoglycemia include no confusion, headaches, pallor or seizures. Pertinent negatives for diabetes include no chest pain,  no fatigue, no polydipsia, no polyphagia, no polyuria and no weakness. Symptoms are worsening. Diabetic complications include nephropathy. Risk factors for coronary artery disease include diabetes mellitus, dyslipidemia, male sex, hypertension and sedentary lifestyle. Current diabetic treatment includes insulin injections. His weight is fluctuating minimally. He is following a generally unhealthy diet. Meal planning includes ADA exchanges. He participates in exercise intermittently. His home blood glucose trend is decreasing steadily. His breakfast blood glucose range is generally 140-180 mg/dl. His lunch blood glucose range is generally 140-180 mg/dl. His dinner blood glucose range is generally 140-180 mg/dl. His bedtime blood glucose range is generally 140-180 mg/dl. His overall blood glucose range is 140-180 mg/dl. (He presents with significantly above target glycemic profile glucose 195 for the last 14 days.  AGP report shows 46% time in range, 31% daily 1 hyperglycemia, 23% able to hyperglycemia.  He has no significant hypoglycemia documented . His  point-of-care A1c is 8%, increasing from 6.9%.     ) An ACE inhibitor/angiotensin II receptor blocker is contraindicated.  Hypertension This is a chronic problem. The current episode started more than 1 year ago. The problem is controlled. Pertinent negatives include no chest pain, headaches, neck pain, palpitations or shortness of breath. Risk factors for coronary artery disease include dyslipidemia, diabetes mellitus, male gender and sedentary lifestyle. Past treatments include calcium channel blockers.  Hyperlipidemia This is a chronic problem. The current episode started more than 1 year ago. The problem is uncontrolled. Exacerbating diseases include diabetes. Pertinent negatives include no chest pain, myalgias or shortness of breath. Current antihyperlipidemic treatment includes bile acid squestrants. Risk factors for coronary artery disease include dyslipidemia and diabetes mellitus.    Review of systems Limited as above.   Objective:    BP (!) 94/56   Pulse (!) 56   Ht 6\' 1"  (1.854 m)   Wt 196 lb 9.6 oz (89.2 kg)   BMI 25.94 kg/m   Wt Readings from Last 3 Encounters:  09/19/22 196 lb 9.6 oz (89.2 kg)  05/18/22 200 lb 12.8 oz (91.1 kg)  01/17/22 201 lb 12.8 oz (91.5 kg)      Recent Results (from the past 2160 hour(s))  Comprehensive metabolic panel     Status: Abnormal   Collection Time: 09/13/22 11:27 AM  Result Value Ref Range   Glucose 198 (H) 70 - 99 mg/dL   BUN 28 (H) 8 - 27 mg/dL   Creatinine, Ser 6.29 (H) 0.76 - 1.27 mg/dL   eGFR 34 (L) >52 WU/XLK/4.40   BUN/Creatinine Ratio 14 10 - 24   Sodium 141 134 - 144 mmol/L   Potassium 4.5 3.5 - 5.2 mmol/L   Chloride 97 96 - 106 mmol/L   CO2 29 20 - 29 mmol/L   Calcium 9.7 8.6 - 10.2 mg/dL   Total Protein 6.8 6.0 - 8.5 g/dL   Albumin 4.1 3.7 - 4.7 g/dL   Globulin, Total 2.7 1.5 - 4.5 g/dL   Bilirubin Total 0.4 0.0 - 1.2 mg/dL   Alkaline Phosphatase 92 44 - 121 IU/L   AST 16 0 - 40 IU/L   ALT 17 0 - 44 IU/L  HgB  A1c     Status: Abnormal   Collection Time: 09/19/22 10:45 AM  Result Value Ref Range   Hemoglobin A1C     HbA1c POC (<> result, manual entry)     HbA1c, POC (prediabetic range)     HbA1c, POC (controlled diabetic range) 8.0 (A) 0.0 - 7.0 %   Lipid Panel  Component Value Date/Time   CHOL 168 01/12/2022 0926   TRIG 176 (H) 01/12/2022 0926   HDL 52 01/12/2022 0926   CHOLHDL 3.2 01/12/2022 0926   CHOLHDL 4.8 11/20/2017 0745   VLDL 28 03/17/2016 0714   LDLCALC 86 01/12/2022 0926   LDLCALC 152 (H) 11/20/2017 0745   LDLDIRECT 147.5 06/10/2012 0930     Assessment & Plan:   1. Type 2 diabetes mellitus with stage 2 chronic kidney disease  He presents with worsening glycemic profile due to the fact that he missed at least a third of his insulin injection opportunities.  He presents with significantly above target glycemic profile glucose 195 for the last 14 days.  AGP report shows 46% time in range, 31% daily 1 hyperglycemia, 23% able to hyperglycemia.  He has no significant hypoglycemia documented . His point-of-care A1c is 8%, increasing from 6.9%.     Patient is advised to stick to the recommended insulin dose and a routine mealtimes to eat 3 meals  a day and avoid unnecessary snacks ( to snack only to correct hypoglycemia).  - he acknowledges that there is a room for improvement in his food and drink choices. - Suggestion is made for him to avoid simple carbohydrates  from his diet including Cakes, Sweet Desserts, Ice Cream, Soda (diet and regular), Sweet Tea, Candies, Chips, Cookies, Store Bought Juices, Alcohol in Excess of  1-2 drinks a day, Artificial Sweeteners,  Coffee Creamer, and "Sugar-free" Products, Lemonade. This will help patient to have more stable blood glucose profile and potentially avoid unintended weight gain.  -He worries about hypoglycemia, and he is at risk for hypoglycemia from inadvertent use of insulin.  Priority in his care would be to avoid hypoglycemia.     -In light of his presentation with significantly above target glycemic profile, he is advised to increase his Novolin 70/30 to 12 units with breakfast and 12 units with supper  only when his Premeal blood glucose readings are above 90 mg/day.    - He is advised to continue glucose monitoring at least 2 times daily-before breakfast and at supper and anytime as needed. - Patient is instructed to call back with extremes of blood glucose readings  less than 70 or greater than 200 mg/dl. He is benefiting, and advised to continue using his CGM device. -He is not a candidate for Metformin, SGLT2 inhibitors, nor incretin therapy.   -I discussed and prescribed the freestyle libre device for him.   2. Mixed hyperlipidemia - His repeat lipid panel showed significant improvement in his LDL improving to 86 from 166.    He is advised to continue Crestor 20 mg p.o. nightly.  Side effects and precautions discussed with him.    - He is advised to continue with omega-3 fatty acids ,avoid butter and fried food and exercise regularly.  3. Essential hypertension  -His blood pressure is controlled to target.   He is currently on amlodipine 10 mg p.o. daily, metoprolol 50 mg daily.  I discussed and added hydrochlorothiazide 25 mg p.o. daily at breakfast for better control of hypertension.    4) vitamin D deficiency -He is on ongoing supplement with vitamin D3  5000 units daily for the next 90 days.   5) multinodular goiter: -She was recently seen for work-up of multinodular goiter.  It was observed that he has had stable euthyroid multinodular goiter documented for the last 7 years.  He will not need biopsy or surgical intervention for this at this time.  7 years of stability makes it unnecessary to do another thyroid imaging.  His recent thyroid function test and within normal state, he will need yearly thyroid function test.  His recent screening ABI was negative for PAD on June 03, 2020.  His neck  study will be repeated in March 2027, or sooner if needed.  Diabetes PT exam today was significant for diminished monofilament sensation as well as diminished sensation on dorsalis pedis and posterior tibial arteries.  He is advised to continue close follow-up with his PCP Dr. Abbe Amsterdam.   I spent  25  minutes in the care of the patient today including review of labs from CMP, Lipids, Thyroid Function, Hematology (current and previous including abstractions from other facilities); face-to-face time discussing  his blood glucose readings/logs, discussing hypoglycemia and hyperglycemia episodes and symptoms, medications doses, his options of short and long term treatment based on the latest standards of care / guidelines;  discussion about incorporating lifestyle medicine;  and documenting the encounter. Risk reduction counseling performed per USPSTF guidelines to reduce cardiovascular risk factors.     Please refer to Patient Instructions for Blood Glucose Monitoring and Insulin/Medications Dosing Guide"  in media tab for additional information. Please  also refer to " Patient Self Inventory" in the Media  tab for reviewed elements of pertinent patient history.  Hector Rose participated in the discussions, expressed understanding, and voiced agreement with the above plans.  All questions were answered to his satisfaction. he is encouraged to contact clinic should he have any questions or concerns prior to his return visit.    Follow up plan: Return in about 3 months (around 12/20/2022) for Bring Meter/CGM Device/Logs- A1c in Office.  Marquis Lunch, MD Phone: [phone]  Fax: [phone]  -  This note was partially dictated with voice recognition software. Similar sounding words can be transcribed inadequately or may not  be corrected upon review.  09/19/2022, 1:56 PM

## 2022-09-19 NOTE — Patient Instructions (Signed)

## 2022-10-04 ENCOUNTER — Other Ambulatory Visit: Payer: Self-pay | Admitting: "Endocrinology

## 2022-10-05 DIAGNOSIS — C641 Malignant neoplasm of right kidney, except renal pelvis: Secondary | ICD-10-CM | POA: Diagnosis not present

## 2022-10-05 DIAGNOSIS — N400 Enlarged prostate without lower urinary tract symptoms: Secondary | ICD-10-CM | POA: Diagnosis not present

## 2022-10-05 DIAGNOSIS — E663 Overweight: Secondary | ICD-10-CM | POA: Diagnosis not present

## 2022-10-05 DIAGNOSIS — Z6827 Body mass index (BMI) 27.0-27.9, adult: Secondary | ICD-10-CM | POA: Diagnosis not present

## 2022-10-05 DIAGNOSIS — C679 Malignant neoplasm of bladder, unspecified: Secondary | ICD-10-CM | POA: Diagnosis not present

## 2022-10-05 DIAGNOSIS — N401 Enlarged prostate with lower urinary tract symptoms: Secondary | ICD-10-CM | POA: Diagnosis not present

## 2022-10-23 ENCOUNTER — Telehealth: Payer: Self-pay

## 2022-10-23 NOTE — Telephone Encounter (Signed)
Pt came by the office stating his glucose readings had been dropping into the 60s for the past 4-5 days. Pt states he has not been taking his Novolin 70/30. Dr.Nida evaluated pt's CGM data. Advised pt to take Novolin 70/30 10 units daily with breakfast and discontinue the evening dose per Dr.Nida's orders. Pt voiced understanding.

## 2022-10-26 DIAGNOSIS — N401 Enlarged prostate with lower urinary tract symptoms: Secondary | ICD-10-CM | POA: Diagnosis not present

## 2022-10-26 DIAGNOSIS — N410 Acute prostatitis: Secondary | ICD-10-CM | POA: Diagnosis not present

## 2022-10-26 DIAGNOSIS — R3912 Poor urinary stream: Secondary | ICD-10-CM | POA: Diagnosis not present

## 2022-11-15 DIAGNOSIS — E663 Overweight: Secondary | ICD-10-CM | POA: Diagnosis not present

## 2022-11-15 DIAGNOSIS — N400 Enlarged prostate without lower urinary tract symptoms: Secondary | ICD-10-CM | POA: Diagnosis not present

## 2022-11-15 DIAGNOSIS — E1140 Type 2 diabetes mellitus with diabetic neuropathy, unspecified: Secondary | ICD-10-CM | POA: Diagnosis not present

## 2022-11-15 DIAGNOSIS — Z6828 Body mass index (BMI) 28.0-28.9, adult: Secondary | ICD-10-CM | POA: Diagnosis not present

## 2022-11-15 DIAGNOSIS — G250 Essential tremor: Secondary | ICD-10-CM | POA: Diagnosis not present

## 2022-11-15 DIAGNOSIS — N182 Chronic kidney disease, stage 2 (mild): Secondary | ICD-10-CM | POA: Diagnosis not present

## 2022-12-07 DIAGNOSIS — Z85520 Personal history of malignant carcinoid tumor of kidney: Secondary | ICD-10-CM | POA: Diagnosis not present

## 2022-12-07 DIAGNOSIS — M109 Gout, unspecified: Secondary | ICD-10-CM | POA: Diagnosis not present

## 2022-12-07 DIAGNOSIS — Z7982 Long term (current) use of aspirin: Secondary | ICD-10-CM | POA: Diagnosis not present

## 2022-12-07 DIAGNOSIS — Z794 Long term (current) use of insulin: Secondary | ICD-10-CM | POA: Diagnosis not present

## 2022-12-07 DIAGNOSIS — E1142 Type 2 diabetes mellitus with diabetic polyneuropathy: Secondary | ICD-10-CM | POA: Diagnosis not present

## 2022-12-07 DIAGNOSIS — I82501 Chronic embolism and thrombosis of unspecified deep veins of right lower extremity: Secondary | ICD-10-CM | POA: Diagnosis not present

## 2022-12-07 DIAGNOSIS — F0390 Unspecified dementia without behavioral disturbance: Secondary | ICD-10-CM | POA: Diagnosis not present

## 2022-12-07 DIAGNOSIS — I10 Essential (primary) hypertension: Secondary | ICD-10-CM | POA: Diagnosis not present

## 2022-12-07 DIAGNOSIS — G250 Essential tremor: Secondary | ICD-10-CM | POA: Diagnosis not present

## 2022-12-07 DIAGNOSIS — E663 Overweight: Secondary | ICD-10-CM | POA: Diagnosis not present

## 2022-12-09 ENCOUNTER — Other Ambulatory Visit: Payer: Self-pay | Admitting: "Endocrinology

## 2022-12-12 DIAGNOSIS — Z23 Encounter for immunization: Secondary | ICD-10-CM | POA: Diagnosis not present

## 2022-12-21 ENCOUNTER — Encounter: Payer: Self-pay | Admitting: "Endocrinology

## 2022-12-21 ENCOUNTER — Ambulatory Visit: Payer: PPO | Admitting: "Endocrinology

## 2022-12-21 VITALS — BP 138/66 | HR 64 | Ht 73.0 in | Wt 208.0 lb

## 2022-12-21 DIAGNOSIS — E782 Mixed hyperlipidemia: Secondary | ICD-10-CM

## 2022-12-21 DIAGNOSIS — Z794 Long term (current) use of insulin: Secondary | ICD-10-CM

## 2022-12-21 DIAGNOSIS — I10 Essential (primary) hypertension: Secondary | ICD-10-CM

## 2022-12-21 DIAGNOSIS — E1122 Type 2 diabetes mellitus with diabetic chronic kidney disease: Secondary | ICD-10-CM

## 2022-12-21 DIAGNOSIS — N1831 Chronic kidney disease, stage 3a: Secondary | ICD-10-CM | POA: Diagnosis not present

## 2022-12-21 LAB — POCT GLYCOSYLATED HEMOGLOBIN (HGB A1C): HbA1c, POC (controlled diabetic range): 8.1 % — AB (ref 0.0–7.0)

## 2022-12-21 MED ORDER — EMPAGLIFLOZIN 10 MG PO TABS
10.0000 mg | ORAL_TABLET | Freq: Every day | ORAL | 1 refills | Status: DC
Start: 2022-12-21 — End: 2023-04-26

## 2022-12-21 MED ORDER — NOVOLIN 70/30 (70-30) 100 UNIT/ML ~~LOC~~ SUSP
10.0000 [IU] | Freq: Two times a day (BID) | SUBCUTANEOUS | 2 refills | Status: DC
Start: 2022-12-21 — End: 2023-04-26

## 2022-12-21 NOTE — Progress Notes (Signed)
12/21/2022  Endocrinology follow-up note  Subjective:    Patient ID: Hector Rose, male    DOB: [birth year],    Past Medical History:  Diagnosis Date   Asthma 04-11-11   AS CHILD ONLY   BPH (benign prostatic hypertrophy) with urinary obstruction    Carpal tunnel syndrome    patient denies at 05/14/17 preop visit    Difficult intubation 04-11-11   08-11-97 some issues with intubation/record with chart   DJD (degenerative joint disease)    DM (diabetes mellitus) (HCC) 04-11-11   tyep II    DVT femoral (deep venous thrombosis) with thrombophlebitis (HCC) 04-11-11   ?'09-tx. on Xarelton x 3-4 years    GERD (gastroesophageal reflux disease) 04-11-11   mild, no med in 1 month   Gout 04-11-11    ankle 2 yrs ago   Hemorrhoids    Hypercholesteremia    Hypertension    Internal hemorrhoids    Keloid 04-11-11   multiple-arms,back, chest   Pulmonary nodule 04-11-11   "PT. NOT AWARE" -denies problems with breathing   Renal calculus    Thyroid disease    hypothyroid   Past Surgical History:  Procedure Laterality Date   CATARACT EXTRACTION W/PHACO Left 10/28/2021   Procedure: CATARACT EXTRACTION PHACO AND INTRAOCULAR LENS PLACEMENT (IOC);  Surgeon: Fabio Pierce, MD;  Location: [facility];  Service: Ophthalmology;  Laterality: Left;  CDE: 6.61   cataract surgery  12/2013   COLONOSCOPY WITH PROPOFOL N/A 02/05/2017   Procedure: COLONOSCOPY WITH PROPOFOL;  Surgeon: Meryl Dare, MD;  Location: [facility];  Service: Endoscopy;  Laterality: N/A;   COSMETIC SURGERY  2000   Keloid injections   CYSTOSCOPY WITH BIOPSY  04/14/2011   Procedure: CYSTOSCOPY WITH BIOPSY;  Surgeon: Crecencio Mc, MD;  Location: [facility];  Service: Urology;  Laterality: N/A;  Bladder Biopsies    CYSTOSCOPY/RETROGRADE/URETEROSCOPY  04/14/2011   Procedure: CYSTOSCOPY/RETROGRADE/URETEROSCOPY;  Surgeon: Crecencio Mc, MD;  Location: [facility];  Service: Urology;  Laterality: Bilateral;  (Bil) RPG    LAPAROSCOPIC NEPHRECTOMY Right  05/21/2017   Procedure: LAPAROSCOPIC RADICAL NEPHRECTOMY;  Surgeon: Heloise Purpura, MD;  Location: [facility];  Service: Urology;  Laterality: Right;   ortho surgery on fingers  2005 & 2008   Dr. Marletta Lor finger release   PROSTATECTOMY  1999   Dr. Earlene Plater   Social History   Socioeconomic History   Marital status: Married    Spouse name: Not on file   Number of children: 2   Years of education: Not on file   Highest education level: Not on file  Occupational History   Occupation: retired from [employer]  Tobacco Use   Smoking status: Never   Smokeless tobacco: Never  Vaping Use   Vaping status: Never Used  Substance and Sexual Activity   Alcohol use: No    Alcohol/week: 0.0 standard drinks of alcohol   Drug use: No   Sexual activity: Yes  Other Topics Concern   Not on file  Social History Narrative   Not on file   Social Determinants of Health   Financial Resource Strain: Not on file  Food Insecurity: Not on file  Transportation Needs: Not on file  Physical Activity: Not on file  Stress: Not on file  Social Connections: Not on file   Outpatient Encounter Medications as of 12/21/2022  Medication Sig   empagliflozin (JARDIANCE) 10 MG TABS tablet Take 1 tablet (10 mg total) by mouth daily.   allopurinol (ZYLOPRIM) 300 MG tablet TAKE 1 TABLET  BY MOUTH ONCE DAILY (Patient taking differently: daily as needed. TAKE 1 TABLET BY MOUTH ONCE DAILY AS NEEDED)   amLODipine (NORVASC) 10 MG tablet TAKE 1 TABLET EVERY MORNING   aspirin EC 81 MG tablet Take 81 mg by mouth daily. Swallow whole.   benzonatate (TESSALON) 100 MG capsule Take 1 capsule (100 mg total) by mouth 3 (three) times daily as needed for cough. Do not take with alcohol or while driving or operating heavy machinery.  May cause drowsiness.   Blood Glucose Monitoring Suppl (ACCU-CHEK GUIDE ME) w/Device KIT 1 Piece by Does not apply route as directed.   Continuous Blood Gluc Receiver (FREESTYLE LIBRE 2 READER) DEVI As  directed   Continuous Glucose Sensor (FREESTYLE LIBRE 2 SENSOR) MISC APPLY NEW SENSOR EVERY 14 (FOURTEEN) DAYS.   diphenhydrAMINE (BENADRYL) 25 MG tablet Take 25 mg by mouth daily as needed for allergies.   donepezil (ARICEPT) 5 MG tablet donepezil 5 mg tablet  TAKE 1 TABLET BY MOUTH EVERYDAY AT BEDTIME   fluticasone (FLONASE) 50 MCG/ACT nasal spray Place 2 sprays daily as needed into both nostrils for allergies or rhinitis.   gabapentin (NEURONTIN) 600 MG tablet Take 600-1,200 mg by mouth See admin instructions. Take 1200 mg in the morning and 600 mg at night   hydrochlorothiazide (HYDRODIURIL) 25 MG tablet Take 1 tablet by mouth daily.   hydrochlorothiazide (HYDRODIURIL) 25 MG tablet TAKE 1 TABLET BY MOUTH EVERY DAY   insulin NPH-regular Human (NOVOLIN 70/30) (70-30) 100 UNIT/ML injection Inject 10 Units into the skin 2 (two) times daily with a meal. Inject 10 units with breakfast, 10 units with supper when premeal blood glucose readings above 90 mg per DL .   Insulin Syringe-Needle U-100 (BD VEO INSULIN SYRINGE U/F) 31G X 15/64" 1 ML MISC USE TWICE A DAY  To inject insulin   Lancets (ONETOUCH DELICA PLUS LANCET33G) MISC USE TO TEST BLOOD SUGAR TWICE DAILY.   metoprolol tartrate (LOPRESSOR) 25 MG tablet Take 25 mg by mouth 2 (two) times daily.   omeprazole (PRILOSEC) 20 MG capsule Take 1 capsule (20 mg total) by mouth daily. (Patient taking differently: Take 20 mg by mouth daily as needed.)   ONETOUCH ULTRA test strip USE TO TEST BLOOD SUGAR TWICE DAILY   predniSONE (DELTASONE) 10 MG tablet Take by mouth.   primidone (MYSOLINE) 50 MG tablet Take 25 mg by mouth daily.   Propylene Glycol (SYSTANE BALANCE OP) Apply 1 drop as needed to eye (irritation).   Rivaroxaban (XARELTO) 15 MG TABS tablet Take 15 mg by mouth daily.    [DISCONTINUED] insulin NPH-regular Human (NOVOLIN 70/30) (70-30) 100 UNIT/ML injection Inject 12 units with breakfast, 12 units with supper when premeal blood glucose readings  above 90 mg per DL . (Patient taking differently: 10-15 Units 2 (two) times daily with a meal. Inject 12 units with breakfast, 12 units with supper when premeal blood glucose readings above 90 mg per DL .)   [DISCONTINUED] simvastatin (ZOCOR) 40 MG tablet Takes 1/2 tablet daily bedtime   No facility-administered encounter medications on file as of 12/21/2022.   ALLERGIES: Allergies  Allergen Reactions   Ace Inhibitors Swelling    REACTION: angio edema (swelling of lips)   Allopurinol    Metformin And Related     Itching in higher doses    VACCINATION STATUS: Immunization History  Administered Date(s) Administered   Influenza Split 11/27/2011, 12/30/2012, 01/01/2014, 03/13/2017   Influenza Whole 01/27/2009, 11/30/2009, 11/17/2010   Influenza, High Dose Seasonal PF  12/21/2015, 01/04/2018   Pneumococcal Conjugate-13 02/04/2018   Pneumococcal Polysaccharide-23 12/02/2008    Diabetes He presents for his follow-up diabetic visit. He has type 2 diabetes mellitus. Onset time: Diagnosed approximately  at age 9. His disease course has been worsening. Pertinent negatives for hypoglycemia include no confusion, headaches, pallor or seizures. Pertinent negatives for diabetes include no chest pain, no fatigue, no polydipsia, no polyphagia, no polyuria and no weakness. Symptoms are worsening. Diabetic complications include nephropathy. Risk factors for coronary artery disease include diabetes mellitus, dyslipidemia, male sex, hypertension and sedentary lifestyle. Current diabetic treatment includes insulin injections. His weight is fluctuating minimally. He is following a generally unhealthy diet. Meal planning includes ADA exchanges. He participates in exercise intermittently. His home blood glucose trend is increasing steadily. His breakfast blood glucose range is generally 140-180 mg/dl. His lunch blood glucose range is generally 140-180 mg/dl. His dinner blood glucose range is generally 140-180 mg/dl.  His bedtime blood glucose range is generally 140-180 mg/dl. His overall blood glucose range is 140-180 mg/dl. (He presents with significantly above target glycemic profile.  He received a CGM device showing average blood glucose of 177 for the last 14 days.  His AGP shows 53% time in range, 30% level 1 hyperglycemia, 16% level 2 hyperglycemia.  He did not have any hypoglycemia.  His point-of-care A1c is 8.1% unchanged from 8% during his last visit.      ) An ACE inhibitor/angiotensin II receptor blocker is contraindicated.  Hypertension This is a chronic problem. The current episode started more than 1 year ago. The problem is controlled. Pertinent negatives include no chest pain, headaches, neck pain, palpitations or shortness of breath. Risk factors for coronary artery disease include dyslipidemia, diabetes mellitus, male gender and sedentary lifestyle. Past treatments include calcium channel blockers.  Hyperlipidemia This is a chronic problem. The current episode started more than 1 year ago. The problem is uncontrolled. Exacerbating diseases include diabetes. Pertinent negatives include no chest pain, myalgias or shortness of breath. Current antihyperlipidemic treatment includes bile acid squestrants. Risk factors for coronary artery disease include dyslipidemia and diabetes mellitus.    Review of systems Limited as above.   Objective:    BP 138/66   Pulse 64   Ht 6\' 1"  (1.854 m)   Wt 208 lb (94.3 kg)   BMI 27.44 kg/m   Wt Readings from Last 3 Encounters:  12/21/22 208 lb (94.3 kg)  09/19/22 196 lb 9.6 oz (89.2 kg)  05/18/22 200 lb 12.8 oz (91.1 kg)      Recent Results (from the past 2160 hour(s))  HgB A1c     Status: Abnormal   Collection Time: 12/21/22 10:42 AM  Result Value Ref Range   Hemoglobin A1C     HbA1c POC (<> result, manual entry)     HbA1c, POC (prediabetic range)     HbA1c, POC (controlled diabetic range) 8.1 (A) 0.0 - 7.0 %   Lipid Panel     Component Value  Date/Time   CHOL 168 01/12/2022 0926   TRIG 176 (H) 01/12/2022 0926   HDL 52 01/12/2022 0926   CHOLHDL 3.2 01/12/2022 0926   CHOLHDL 4.8 11/20/2017 0745   VLDL 28 03/17/2016 0714   LDLCALC 86 01/12/2022 0926   LDLCALC 152 (H) 11/20/2017 0745   LDLDIRECT 147.5 06/10/2012 0930     Assessment & Plan:   1. Type 2 diabetes mellitus with stage 2 chronic kidney disease  He presents with worsening glycemic profile due to the fact that  he missed at least a third of his insulin injection opportunities.  He presents with significantly above target glycemic profile.  He received a CGM device showing average blood glucose of 177 for the last 14 days.  His AGP shows 53% time in range, 30% level 1 hyperglycemia, 16% level 2 hyperglycemia.  He did not have any hypoglycemia.  His point-of-care A1c is 8.1% unchanged from 8% during his last visit.     Patient is advised to stick to the recommended insulin dose and a routine mealtimes to eat 3 meals  a day and avoid unnecessary snacks ( to snack only to correct hypoglycemia).  - he acknowledges that there is a room for improvement in his food and drink choices. - Suggestion is made for him to avoid simple carbohydrates  from his diet including Cakes, Sweet Desserts, Ice Cream, Soda (diet and regular), Sweet Tea, Candies, Chips, Cookies, Store Bought Juices, Alcohol in Excess of  1-2 drinks a day, Artificial Sweeteners,  Coffee Creamer, and "Sugar-free" Products, Lemonade. This will help patient to have more stable blood glucose profile and potentially avoid unintended weight gain.  -He worries about hypoglycemia, and he is at risk for hypoglycemia from inadvertent use of insulin.  Priority in his care would be to avoid hypoglycemia.    -He hesitates to take higher dose of insulin.  He is advised to keep his current dose of Novolin 70/30 10 units with breakfast and 10 units with supper  only when his Premeal blood glucose readings are above 90 mg/day.   -He  will benefit from an SGLT2 inhibitor.  I discussed and added Jardiance 10 mg p.o. daily at breakfast.  Side effects and precautions discussed with him.  - Patient is instructed to call back with extremes of blood glucose readings  less than 70 or greater than 200 mg/dl. He is benefiting, and advised to continue using his CGM device. -He is not a candidate for Metformin nor incretin therapy.    2. Mixed hyperlipidemia - His repeat lipid panel showed significant improvement in his LDL improving to 86 from 166.  He was advised to continue Crestor 20 mg p.o. nightly. Side effects and precautions discussed with him.    - He is advised to continue with omega-3 fatty acids ,avoid butter and fried food and exercise regularly.  3. Essential hypertension  -His blood pressure is controlled to target.   He is currently on amlodipine 10 mg p.o. daily, metoprolol 50 mg daily.  I discussed and added hydrochlorothiazide 25 mg p.o. daily at breakfast for better control of hypertension.    4) vitamin D deficiency -He is on ongoing supplement with vitamin D3  5000 units daily for the next 90 days.   5) multinodular goiter: -She was recently seen for work-up of multinodular goiter.  It was observed that he has had stable euthyroid multinodular goiter documented for the last 7 years.  He will not need biopsy or surgical intervention for this at this time.  7 years of stability makes it unnecessary to do another thyroid imaging.  His recent thyroid function test and within normal state, he will need yearly thyroid function test.  His recent screening ABI was negative for PAD on June 03, 2020.  His neck study will be repeated in March 2027, or sooner if needed.  Diabetes PT exam today was significant for diminished monofilament sensation as well as diminished sensation on dorsalis pedis and posterior tibial arteries.  He is advised to continue close  follow-up with his PCP Dr. Assunta Found.   I spent  26   minutes in the care of the patient today including review of labs from CMP, Lipids, Thyroid Function, Hematology (current and previous including abstractions from other facilities); face-to-face time discussing  his blood glucose readings/logs, discussing hypoglycemia and hyperglycemia episodes and symptoms, medications doses, his options of short and long term treatment based on the latest standards of care / guidelines;  discussion about incorporating lifestyle medicine;  and documenting the encounter. Risk reduction counseling performed per USPSTF guidelines to reduce  cardiovascular risk factors.     Please refer to Patient Instructions for Blood Glucose Monitoring and Insulin/Medications Dosing Guide"  in media tab for additional information. Please  also refer to " Patient Self Inventory" in the Media  tab for reviewed elements of pertinent patient history.  Hector Rose participated in the discussions, expressed understanding, and voiced agreement with the above plans.  All questions were answered to his satisfaction. he is encouraged to contact clinic should he have any questions or concerns prior to his return visit.    Follow up plan: Return in about 4 months (around 04/23/2023) for F/U with Pre-visit Labs, Meter/CGM/Logs, A1c here.  Marquis Lunch, MD Phone: [phone]  Fax: [phone]  -  This note was partially dictated with voice recognition software. Similar sounding words can be transcribed inadequately or may not  be corrected upon review.  12/21/2022, 11:44 AM

## 2022-12-21 NOTE — Patient Instructions (Signed)

## 2022-12-22 DIAGNOSIS — E663 Overweight: Secondary | ICD-10-CM | POA: Diagnosis not present

## 2022-12-22 DIAGNOSIS — Z6828 Body mass index (BMI) 28.0-28.9, adult: Secondary | ICD-10-CM | POA: Diagnosis not present

## 2022-12-22 DIAGNOSIS — M545 Low back pain, unspecified: Secondary | ICD-10-CM | POA: Diagnosis not present

## 2023-02-20 DIAGNOSIS — E1140 Type 2 diabetes mellitus with diabetic neuropathy, unspecified: Secondary | ICD-10-CM | POA: Diagnosis not present

## 2023-02-20 DIAGNOSIS — Z1331 Encounter for screening for depression: Secondary | ICD-10-CM | POA: Diagnosis not present

## 2023-02-20 DIAGNOSIS — E782 Mixed hyperlipidemia: Secondary | ICD-10-CM | POA: Diagnosis not present

## 2023-02-20 DIAGNOSIS — E663 Overweight: Secondary | ICD-10-CM | POA: Diagnosis not present

## 2023-02-20 DIAGNOSIS — Z0001 Encounter for general adult medical examination with abnormal findings: Secondary | ICD-10-CM | POA: Diagnosis not present

## 2023-02-20 DIAGNOSIS — C679 Malignant neoplasm of bladder, unspecified: Secondary | ICD-10-CM | POA: Diagnosis not present

## 2023-02-20 DIAGNOSIS — Z6829 Body mass index (BMI) 29.0-29.9, adult: Secondary | ICD-10-CM | POA: Diagnosis not present

## 2023-02-20 DIAGNOSIS — E7849 Other hyperlipidemia: Secondary | ICD-10-CM | POA: Diagnosis not present

## 2023-02-20 DIAGNOSIS — E041 Nontoxic single thyroid nodule: Secondary | ICD-10-CM | POA: Diagnosis not present

## 2023-02-20 DIAGNOSIS — I82501 Chronic embolism and thrombosis of unspecified deep veins of right lower extremity: Secondary | ICD-10-CM | POA: Diagnosis not present

## 2023-02-20 DIAGNOSIS — E1122 Type 2 diabetes mellitus with diabetic chronic kidney disease: Secondary | ICD-10-CM | POA: Diagnosis not present

## 2023-03-08 DIAGNOSIS — Z6828 Body mass index (BMI) 28.0-28.9, adult: Secondary | ICD-10-CM | POA: Diagnosis not present

## 2023-03-08 DIAGNOSIS — N182 Chronic kidney disease, stage 2 (mild): Secondary | ICD-10-CM | POA: Diagnosis not present

## 2023-03-08 DIAGNOSIS — J0100 Acute maxillary sinusitis, unspecified: Secondary | ICD-10-CM | POA: Diagnosis not present

## 2023-03-08 DIAGNOSIS — G250 Essential tremor: Secondary | ICD-10-CM | POA: Diagnosis not present

## 2023-03-08 DIAGNOSIS — E663 Overweight: Secondary | ICD-10-CM | POA: Diagnosis not present

## 2023-03-08 DIAGNOSIS — E1140 Type 2 diabetes mellitus with diabetic neuropathy, unspecified: Secondary | ICD-10-CM | POA: Diagnosis not present

## 2023-03-14 ENCOUNTER — Other Ambulatory Visit: Payer: Self-pay | Admitting: "Endocrinology

## 2023-03-14 DIAGNOSIS — Z794 Long term (current) use of insulin: Secondary | ICD-10-CM

## 2023-03-27 DIAGNOSIS — E119 Type 2 diabetes mellitus without complications: Secondary | ICD-10-CM | POA: Diagnosis not present

## 2023-04-03 DIAGNOSIS — M545 Low back pain, unspecified: Secondary | ICD-10-CM | POA: Diagnosis not present

## 2023-04-03 DIAGNOSIS — Z6828 Body mass index (BMI) 28.0-28.9, adult: Secondary | ICD-10-CM | POA: Diagnosis not present

## 2023-04-03 DIAGNOSIS — E663 Overweight: Secondary | ICD-10-CM | POA: Diagnosis not present

## 2023-04-15 DIAGNOSIS — M109 Gout, unspecified: Secondary | ICD-10-CM | POA: Diagnosis not present

## 2023-04-17 DIAGNOSIS — E1122 Type 2 diabetes mellitus with diabetic chronic kidney disease: Secondary | ICD-10-CM | POA: Diagnosis not present

## 2023-04-17 DIAGNOSIS — Z794 Long term (current) use of insulin: Secondary | ICD-10-CM | POA: Diagnosis not present

## 2023-04-17 DIAGNOSIS — N1831 Chronic kidney disease, stage 3a: Secondary | ICD-10-CM | POA: Diagnosis not present

## 2023-04-18 LAB — COMPREHENSIVE METABOLIC PANEL
ALT: 15 [IU]/L (ref 0–44)
AST: 20 [IU]/L (ref 0–40)
Albumin: 4.1 g/dL (ref 3.7–4.7)
Alkaline Phosphatase: 94 [IU]/L (ref 44–121)
BUN/Creatinine Ratio: 13 (ref 10–24)
BUN: 26 mg/dL (ref 8–27)
Bilirubin Total: 0.5 mg/dL (ref 0.0–1.2)
CO2: 25 mmol/L (ref 20–29)
Calcium: 9.5 mg/dL (ref 8.6–10.2)
Chloride: 103 mmol/L (ref 96–106)
Creatinine, Ser: 1.95 mg/dL — ABNORMAL HIGH (ref 0.76–1.27)
Globulin, Total: 2.5 g/dL (ref 1.5–4.5)
Glucose: 112 mg/dL — ABNORMAL HIGH (ref 70–99)
Potassium: 3.9 mmol/L (ref 3.5–5.2)
Sodium: 143 mmol/L (ref 134–144)
Total Protein: 6.6 g/dL (ref 6.0–8.5)
eGFR: 34 mL/min/{1.73_m2} — ABNORMAL LOW (ref >59)

## 2023-04-18 LAB — LIPID PANEL
Chol/HDL Ratio: 3.9 {ratio} (ref 0.0–5.0)
Cholesterol, Total: 188 mg/dL (ref 100–199)
HDL: 48 mg/dL (ref >39)
LDL Chol Calc (NIH): 120 mg/dL — ABNORMAL HIGH (ref 0–99)
Triglycerides: 109 mg/dL (ref 0–149)
VLDL Cholesterol Cal: 20 mg/dL (ref 5–40)

## 2023-04-18 LAB — TSH: TSH: 0.868 u[IU]/mL (ref 0.450–4.500)

## 2023-04-18 LAB — T4, FREE: Free T4: 0.86 ng/dL (ref 0.82–1.77)

## 2023-04-21 NOTE — Progress Notes (Signed)
Assessment/Plan:   RUE dystonia/writers cramp  -Patient's symptoms fairly classic.  The only thing that is not classic is the timeframe, with it coming on in the last 4-5 months.  When he writes, he has flexion/pulling of the flexors of fingers 2 and 3 (primarily flexor digitorum profundus) and abductor pollicis brevis.  Patient writes just a few words and has to reposition the hand because of tremulousness.  He and I discussed Botox and the risks and benefits of this.  He is interested.  He is going to think about it while we do some testing, but if he wants to pursue it, we will send him to Dr. Wynn Banker for a consult.  -pt is currently on Xarelto and primidone.  Patient and I discussed that there is a major contraindication between these two medications and they cannot be given together.  He is apparently on Xarelto for his remote history of DVT.  His primidone will have to be discontinued.  He and I discussed that generally primidone does not work well for dystonia anyway.  Even if he had classic essential tremor, options would be very limited.  He is already on low-dose of a beta-blocker (metoprolol, 25 mg twice per day).  While patient gets much of his care outside of our health system and I don't have all of his vitals, I was able to look at his review flowsheet, and he is often bradycardic when he comes into our health system, so I do not think it would be wise to increase the metoprolol or change to propranolol.  The only other medication that is first-line is high-dose topiramate which is generally not used in this age group because of cognitive change.  In addition, this patient has had a history of renal calculi and topiramate increases that risk as well.  That leaves only second line drugs for tremor and he is already on one of them, which is gabapentin.  Many of the other second line drugs are anticholinergic, which are not a good idea in this age group.  Again, he does not have classic tremor,  so I do not think we need to add medication.  Hyperreflexia  -Because patient is quite hyperreflexic, we decided to go ahead and do an MRI of the brain and cervical spine.  In part, I did this also because of the relatively fast nature in which the dystonia came on, although it did not come on acutely, but rather over several months.  He and I did discuss that there is no doubt that he will have degenerative changes and likely some degree of neuroforaminal and spinal stenosis, but we just want to make sure that there is nothing more significant.  In regards to the brain, he will likely have at least a moderate degree of small vessel disease given his other medical issues.  Again, we are trying to make sure we are not missing things that would require other treatment.    Subjective:   Hector Rose was seen today in the movement disorders clinic for neurologic consultation at the request of Assunta Found, MD.  The consultation is for the evaluation of tremor.  Patient is currently on primidone, 50 mg daily for tremor. It helps some He is also on gabapentin, 600 mg 3 times per day.  He is on metoprolol, 25 mg twice per day.  Tremor: Yes.     How long has it been going on? 4-5 months ago - he isn't sure if new medications  started at that time  At rest or with activation?  Only with writing  Fam hx of tremor?  No.  Located where?  R hand and only with writing.  Never with eating/writing  Affected by caffeine:  doesn't drink enough to know  Affected by alcohol:  doesn't drink any  Affected by stress:  No.  Affected by fatigue:  No.  Spills soup if on spoon:  No.  Spills glass of liquid if full:  No.  Tremor inducing meds:  on prednisone for gout but only on for few weeks  Other Specific Symptoms:  Voice: usually its good Postural symptoms:  No.  Falls?  No. Bradykinesia symptoms: no bradykinesia noted Loss of smell:  No. Loss of taste:  No. Difficulty Swallowing:  No. Handwriting,  micrographia: No. Trouble with ADL's:  No.  Trouble buttoning clothing: No.   ALLERGIES:   Allergies  Allergen Reactions   Ace Inhibitors Swelling    REACTION: angio edema (swelling of lips)   Allopurinol    Metformin And Related     Itching in higher doses     CURRENT MEDICATIONS:  Current Outpatient Medications  Medication Instructions   allopurinol (ZYLOPRIM) 300 MG tablet TAKE 1 TABLET BY MOUTH ONCE DAILY   amLODipine (NORVASC) 10 MG tablet TAKE 1 TABLET EVERY MORNING   aspirin EC 81 mg, Daily   Blood Glucose Monitoring Suppl (ACCU-CHEK GUIDE ME) w/Device KIT 1 Piece, Does not apply, As directed   Continuous Blood Gluc Receiver (FREESTYLE LIBRE 2 READER) DEVI As directed   Continuous Glucose Sensor (FREESTYLE LIBRE 2 SENSOR) MISC APPLY NEW SENSOR EVERY 14 (FOURTEEN) DAYS.   diphenhydrAMINE (BENADRYL) 25 mg, Daily PRN   donepezil (ARICEPT) 5 MG tablet donepezil 5 mg tablet  TAKE 1 TABLET BY MOUTH EVERYDAY AT BEDTIME   empagliflozin (JARDIANCE) 10 mg, Oral, Daily   fluticasone (FLONASE) 50 MCG/ACT nasal spray 2 sprays, Each Nare, Daily PRN   gabapentin (NEURONTIN) 600-1,200 mg, See admin instructions   hydrochlorothiazide (HYDRODIURIL) 25 MG tablet 1 tablet, Daily   hydrochlorothiazide (HYDRODIURIL) 25 MG tablet TAKE 1 TABLET BY MOUTH EVERY DAY   HYDROcodone-acetaminophen (NORCO/VICODIN) 5-325 MG tablet 1 tablet, Every 8 hours PRN   insulin NPH-regular Human (NOVOLIN 70/30) (70-30) 100 UNIT/ML injection 10 Units, Subcutaneous, 2 times daily with meals, Inject 10 units with breakfast, 10 units with supper when premeal blood glucose readings above 90 mg per DL .   Insulin Syringe-Needle U-100 (BD VEO INSULIN SYRINGE U/F) 31G X 15/64" 1 ML MISC USE TWICE A DAY  To inject insulin   Lancets (ONETOUCH DELICA PLUS LANCET33G) MISC USE TO TEST BLOOD SUGAR TWICE DAILY.   metoprolol tartrate (LOPRESSOR) 25 mg, 2 times daily   omeprazole (PRILOSEC) 20 mg, Oral, Daily   ONETOUCH ULTRA test  strip USE TO TEST BLOOD SUGAR TWICE DAILY   predniSONE (DELTASONE) 10 MG tablet Take by mouth.   primidone (MYSOLINE) 25 mg, Daily   Propylene Glycol (SYSTANE BALANCE OP) 1 drop, As needed   Rivaroxaban (XARELTO) 15 mg, Daily    Objective:   PHYSICAL EXAMINATION:    VITALS:   Vitals:   04/24/23 0943  BP: 132/70  Pulse: 78  SpO2: 98%  Weight: 201 lb (91.2 kg)  Height: 6\' 1"  (1.854 m)    GEN:  The patient appears stated age and is in NAD. HEENT:  Normocephalic, atraumatic.  The mucous membranes are moist. The superficial temporal arteries are without ropiness or tenderness. CV:  RRR Lungs:  CTAB Neck/HEME:  There are no carotid bruits bilaterally.  Neurological examination:  Orientation: The patient is alert and oriented x3.  Cranial nerves: There is good facial symmetry.  Extraocular muscles are intact. The visual fields are full to confrontational testing. The speech is fluent and clear. Soft palate rises symmetrically and there is no tongue deviation. Hearing is intact to conversational tone. Sensation: Sensation is intact to light touch throughout (facial, trunk, extremities).  There is no extinction with double simultaneous stimulation.  Motor: Strength is 5/5 in the bilateral upper and lower extremities.   Shoulder shrug is equal and symmetric.  There is no pronator drift. Deep tendon reflexes: Deep tendon reflexes are 3/4 at the bilateral biceps, triceps, brachioradialis, 3+ at the bilateral patella with cross adductor reflexes bilaterally.  Movement examination: Tone: There is normal tone in the bilateral upper extremities.  The tone in the lower extremities is normal.  Abnormal movements: There is no rest tremor.  There is no postural tremor.  There is no significant intention tremor.  He is able to draw Archimedes spirals.  He has significant trouble with handwriting sample with evidence of flexion of fingers 2 and 3 and slight pull of the right APB. Coordination:  There  is no decremation with RAM's, with any form of RAMS, including alternating supination and pronation of the forearm, hand opening and closing, finger taps, heel taps and toe taps.  Gait and Station: The patient ambulates well in the hall I have reviewed and interpreted the following labs independently   Chemistry      Component Value Date/Time   NA 143 04/17/2023 0854   K 3.9 04/17/2023 0854   CL 103 04/17/2023 0854   CO2 25 04/17/2023 0854   BUN 26 04/17/2023 0854   CREATININE 1.95 (H) 04/17/2023 0854   CREATININE 2.06 (H) 06/27/2019 1001      Component Value Date/Time   CALCIUM 9.5 04/17/2023 0854   ALKPHOS 94 04/17/2023 0854   AST 20 04/17/2023 0854   ALT 15 04/17/2023 0854   BILITOT 0.5 04/17/2023 0854      Lab Results  Component Value Date   TSH 0.868 04/17/2023   Lab Results  Component Value Date   WBC 6.6 02/10/2020   HGB 14.6 02/10/2020   HCT 43.2 02/10/2020   MCV 93.7 02/10/2020   PLT 152 02/10/2020      Total time spent on today's visit was 45 minutes, including both face-to-face time and nonface-to-face time.  Time included that spent on review of records (prior notes available to me/labs/imaging if pertinent), discussing treatment and goals, answering patient's questions and coordinating care.  Cc:  Assunta Found, MD

## 2023-04-24 ENCOUNTER — Other Ambulatory Visit: Payer: Self-pay

## 2023-04-24 ENCOUNTER — Ambulatory Visit: Payer: PPO | Admitting: Neurology

## 2023-04-24 ENCOUNTER — Encounter: Payer: Self-pay | Admitting: Neurology

## 2023-04-24 VITALS — BP 132/70 | HR 78 | Ht 73.0 in | Wt 201.0 lb

## 2023-04-24 DIAGNOSIS — M542 Cervicalgia: Secondary | ICD-10-CM

## 2023-04-24 DIAGNOSIS — R292 Abnormal reflex: Secondary | ICD-10-CM

## 2023-04-24 DIAGNOSIS — G248 Other dystonia: Secondary | ICD-10-CM

## 2023-04-24 DIAGNOSIS — G249 Dystonia, unspecified: Secondary | ICD-10-CM

## 2023-04-24 NOTE — Patient Instructions (Signed)
STOP primidone

## 2023-04-24 NOTE — Addendum Note (Signed)
Addended by: Ila Mcgill C on: 04/24/2023 12:19 PM   Modules accepted: Orders

## 2023-04-26 ENCOUNTER — Ambulatory Visit (INDEPENDENT_AMBULATORY_CARE_PROVIDER_SITE_OTHER): Payer: PPO | Admitting: "Endocrinology

## 2023-04-26 ENCOUNTER — Encounter: Payer: Self-pay | Admitting: "Endocrinology

## 2023-04-26 VITALS — BP 118/64 | HR 56 | Ht 73.0 in | Wt 200.6 lb

## 2023-04-26 DIAGNOSIS — E1122 Type 2 diabetes mellitus with diabetic chronic kidney disease: Secondary | ICD-10-CM

## 2023-04-26 DIAGNOSIS — N1831 Chronic kidney disease, stage 3a: Secondary | ICD-10-CM | POA: Diagnosis not present

## 2023-04-26 DIAGNOSIS — I10 Essential (primary) hypertension: Secondary | ICD-10-CM | POA: Diagnosis not present

## 2023-04-26 DIAGNOSIS — Z794 Long term (current) use of insulin: Secondary | ICD-10-CM | POA: Diagnosis not present

## 2023-04-26 DIAGNOSIS — E782 Mixed hyperlipidemia: Secondary | ICD-10-CM | POA: Diagnosis not present

## 2023-04-26 LAB — POCT GLYCOSYLATED HEMOGLOBIN (HGB A1C)

## 2023-04-26 MED ORDER — EMPAGLIFLOZIN 10 MG PO TABS: 10.0000 mg | ORAL_TABLET | Freq: Every day | ORAL | 1 refills | Status: DC

## 2023-04-26 MED ORDER — NOVOLIN 70/30 (70-30) 100 UNIT/ML ~~LOC~~ SUSP: 15.0000 [IU] | Freq: Two times a day (BID) | SUBCUTANEOUS | 1 refills | Status: DC

## 2023-04-26 NOTE — Patient Instructions (Signed)

## 2023-04-26 NOTE — Progress Notes (Signed)
04/26/2023  Endocrinology follow-up note  Subjective:    Patient ID: Hector Rose, male    DOB: November 25, 1940,    Past Medical History:  Diagnosis Date   Asthma 04-11-11   AS CHILD ONLY   BPH (benign prostatic hypertrophy) with urinary obstruction    Carpal tunnel syndrome    patient denies at 05/14/17 preop visit    Difficult intubation 04-11-11   08-11-97 some issues with intubation/record with chart   DJD (degenerative joint disease)    DM (diabetes mellitus) (HCC) 04-11-11   tyep II    DVT femoral (deep venous thrombosis) with thrombophlebitis (HCC) 04-11-11   ?'09-tx. on Xarelton x 3-4 years    GERD (gastroesophageal reflux disease) 04-11-11   mild, no med in 1 month   Gout 04-11-11    ankle 2 yrs ago   Hemorrhoids    Hypercholesteremia    Hypertension    Internal hemorrhoids    Keloid 04-11-11   multiple-arms,back, chest   Pulmonary nodule 04-11-11   "PT. NOT AWARE" -denies problems with breathing   Renal calculus    Thyroid disease    hypothyroid   Past Surgical History:  Procedure Laterality Date   CATARACT EXTRACTION W/PHACO Left 10/28/2021   Procedure: CATARACT EXTRACTION PHACO AND INTRAOCULAR LENS PLACEMENT (IOC);  Surgeon: Fabio Pierce, MD;  Location: [facility];  Service: Ophthalmology;  Laterality: Left;  CDE: 6.61   cataract surgery  12/2013   COLONOSCOPY WITH PROPOFOL N/A 02/05/2017   Procedure: COLONOSCOPY WITH PROPOFOL;  Surgeon: Meryl Dare, MD;  Location: [facility];  Service: Endoscopy;  Laterality: N/A;   COSMETIC SURGERY  2000   Keloid injections   CYSTOSCOPY WITH BIOPSY  04/14/2011   Procedure: CYSTOSCOPY WITH BIOPSY;  Surgeon: Crecencio Mc, MD;  Location: [facility];  Service: Urology;  Laterality: N/A;  Bladder Biopsies    CYSTOSCOPY/RETROGRADE/URETEROSCOPY  04/14/2011   Procedure: CYSTOSCOPY/RETROGRADE/URETEROSCOPY;  Surgeon: Crecencio Mc, MD;  Location: [facility];  Service: Urology;  Laterality: Bilateral;  (Bil) RPG    LAPAROSCOPIC NEPHRECTOMY Right  05/21/2017   Procedure: LAPAROSCOPIC RADICAL NEPHRECTOMY;  Surgeon: Heloise Purpura, MD;  Location: [facility];  Service: Urology;  Laterality: Right;   ortho surgery on fingers  2005 & 2008   Dr. Marletta Lor finger release   PROSTATECTOMY  1999   Dr. Earlene Plater   Social History   Socioeconomic History   Marital status: Married    Spouse name: Not on file   Number of children: 2   Years of education: Not on file   Highest education level: Not on file  Occupational History   Occupation: retired from [employer]  Tobacco Use   Smoking status: Never   Smokeless tobacco: Never  Vaping Use   Vaping status: Never Used  Substance and Sexual Activity   Alcohol use: No    Alcohol/week: 0.0 standard drinks of alcohol   Drug use: No   Sexual activity: Yes  Other Topics Concern   Not on file  Social History Narrative   Right handed    Retired    [employer] Strain: Not on file  Food Insecurity: Not on file  Transportation Needs: Not on file  Physical Activity: Not on file  Stress: Not on file  Social Connections: Not on file   Outpatient Encounter Medications as of 04/26/2023  Medication Sig   allopurinol (ZYLOPRIM) 300 MG tablet TAKE 1 TABLET BY MOUTH ONCE DAILY (Patient taking differently: daily as needed. TAKE 1 TABLET  BY MOUTH ONCE DAILY AS NEEDED)   amLODipine (NORVASC) 10 MG tablet TAKE 1 TABLET EVERY MORNING   aspirin EC 81 MG tablet Take 81 mg by mouth daily. Swallow whole.   Blood Glucose Monitoring Suppl (ACCU-CHEK GUIDE ME) w/Device KIT 1 Piece by Does not apply route as directed.   Continuous Blood Gluc Receiver (FREESTYLE LIBRE 2 READER) DEVI As directed   Continuous Glucose Sensor (FREESTYLE LIBRE 2 SENSOR) MISC APPLY NEW SENSOR EVERY 14 (FOURTEEN) DAYS.   diphenhydrAMINE (BENADRYL) 25 MG tablet Take 25 mg by mouth daily as needed for allergies.   donepezil (ARICEPT) 5 MG tablet donepezil 5 mg tablet  TAKE 1 TABLET BY MOUTH EVERYDAY AT  BEDTIME   empagliflozin (JARDIANCE) 10 MG TABS tablet Take 1 tablet (10 mg total) by mouth daily.   fluticasone (FLONASE) 50 MCG/ACT nasal spray Place 2 sprays daily as needed into both nostrils for allergies or rhinitis.   gabapentin (NEURONTIN) 600 MG tablet Take 600-1,200 mg by mouth See admin instructions. Take 1200 mg in the morning and 600 mg at night   hydrochlorothiazide (HYDRODIURIL) 25 MG tablet Take 1 tablet by mouth daily.   hydrochlorothiazide (HYDRODIURIL) 25 MG tablet TAKE 1 TABLET BY MOUTH EVERY DAY   HYDROcodone-acetaminophen (NORCO/VICODIN) 5-325 MG tablet Take 1 tablet by mouth every 8 (eight) hours as needed.   insulin NPH-regular Human (NOVOLIN 70/30) (70-30) 100 UNIT/ML injection Inject 15-20 Units into the skin 2 (two) times daily with a meal. Inject 20 units with breakfast, 15 units with supper when premeal blood glucose readings above 90 mg per DL .   Insulin Syringe-Needle U-100 (BD VEO INSULIN SYRINGE U/F) 31G X 15/64" 1 ML MISC USE TWICE A DAY  To inject insulin   Lancets (ONETOUCH DELICA PLUS LANCET33G) MISC USE TO TEST BLOOD SUGAR TWICE DAILY.   metoprolol tartrate (LOPRESSOR) 25 MG tablet Take 25 mg by mouth 2 (two) times daily.   omeprazole (PRILOSEC) 20 MG capsule Take 1 capsule (20 mg total) by mouth daily. (Patient taking differently: Take 20 mg by mouth daily as needed.)   ONETOUCH ULTRA test strip USE TO TEST BLOOD SUGAR TWICE DAILY   predniSONE (DELTASONE) 10 MG tablet Take by mouth.   Propylene Glycol (SYSTANE BALANCE OP) Apply 1 drop as needed to eye (irritation).   Rivaroxaban (XARELTO) 15 MG TABS tablet Take 15 mg by mouth daily.    [DISCONTINUED] empagliflozin (JARDIANCE) 10 MG TABS tablet Take 1 tablet (10 mg total) by mouth daily. (Patient not taking: Reported on 04/24/2023)   [DISCONTINUED] insulin NPH-regular Human (NOVOLIN 70/30) (70-30) 100 UNIT/ML injection Inject 10 Units into the skin 2 (two) times daily with a meal. Inject 10 units with breakfast,  10 units with supper when premeal blood glucose readings above 90 mg per DL . (Patient taking differently: Inject 10-15 Units into the skin 2 (two) times daily with a meal. Inject 15 units with breakfast, 10-12 units with supper when premeal blood glucose readings above 90 mg per DL .)   [DISCONTINUED] simvastatin (ZOCOR) 40 MG tablet Takes 1/2 tablet daily bedtime   No facility-administered encounter medications on file as of 04/26/2023.   ALLERGIES: Allergies  Allergen Reactions   Ace Inhibitors Swelling    REACTION: angio edema (swelling of lips)   Allopurinol    Metformin And Related     Itching in higher doses    VACCINATION STATUS: Immunization History  Administered Date(s) Administered   Influenza Split 11/27/2011, 12/30/2012, 01/01/2014, 03/13/2017   Influenza  Whole 01/27/2009, 11/30/2009, 11/17/2010   Influenza, High Dose Seasonal PF 12/21/2015, 01/04/2018   Pneumococcal Conjugate-13 02/04/2018   Pneumococcal Polysaccharide-23 12/02/2008    Diabetes He presents for his follow-up diabetic visit. He has type 2 diabetes mellitus. Onset time: Diagnosed approximately  at age 41. His disease course has been worsening. Pertinent negatives for hypoglycemia include no confusion, headaches, pallor or seizures. Pertinent negatives for diabetes include no chest pain, no fatigue, no polydipsia, no polyphagia, no polyuria and no weakness. Symptoms are worsening. Diabetic complications include nephropathy. Risk factors for coronary artery disease include diabetes mellitus, dyslipidemia, male sex, hypertension and sedentary lifestyle. Current diabetic treatment includes insulin injections. His weight is fluctuating minimally. He is following a generally unhealthy diet. Meal planning includes ADA exchanges. He participates in exercise intermittently. His home blood glucose trend is fluctuating minimally. His breakfast blood glucose range is generally 140-180 mg/dl. His lunch blood glucose range is  generally 140-180 mg/dl. His dinner blood glucose range is generally 140-180 mg/dl. His bedtime blood glucose range is generally 140-180 mg/dl. His overall blood glucose range is 140-180 mg/dl. (He presents with significantly above target glycemic profile averaging 189 mg per DL.  He is CGM device shows 48% time range, 30% level 1 hyperglycemia, 22% level 2 hyperglycemia.  He has no hypoglycemia.  His point-of-care A1c is unchanged at 8.1%.         ) An ACE inhibitor/angiotensin II receptor blocker is contraindicated.  Hypertension This is a chronic problem. The current episode started more than 1 year ago. The problem is controlled. Pertinent negatives include no chest pain, headaches, neck pain, palpitations or shortness of breath. Risk factors for coronary artery disease include dyslipidemia, diabetes mellitus, male gender and sedentary lifestyle. Past treatments include calcium channel blockers.  Hyperlipidemia This is a chronic problem. The current episode started more than 1 year ago. The problem is uncontrolled. Exacerbating diseases include diabetes. Pertinent negatives include no chest pain, myalgias or shortness of breath. Current antihyperlipidemic treatment includes bile acid squestrants. Risk factors for coronary artery disease include dyslipidemia and diabetes mellitus.    Review of systems Limited as above.   Objective:    BP 118/64   Pulse (!) 56   Ht 6\' 1"  (1.854 m)   Wt 200 lb 9.6 oz (91 kg)   BMI 26.47 kg/m   Wt Readings from Last 3 Encounters:  04/26/23 200 lb 9.6 oz (91 kg)  04/24/23 201 lb (91.2 kg)  12/21/22 208 lb (94.3 kg)      Recent Results (from the past 2160 hours)  Comprehensive metabolic panel     Status: Abnormal   Collection Time: 04/17/23  8:54 AM  Result Value Ref Range   Glucose 112 (H) 70 - 99 mg/dL   BUN 26 8 - 27 mg/dL   Creatinine, Ser 2.95 (H) 0.76 - 1.27 mg/dL   eGFR 34 (L) >62 ZH/YQM/5.78   BUN/Creatinine Ratio 13 10 - 24   Sodium 143  134 - 144 mmol/L   Potassium 3.9 3.5 - 5.2 mmol/L   Chloride 103 96 - 106 mmol/L   CO2 25 20 - 29 mmol/L   Calcium 9.5 8.6 - 10.2 mg/dL   Total Protein 6.6 6.0 - 8.5 g/dL   Albumin 4.1 3.7 - 4.7 g/dL   Globulin, Total 2.5 1.5 - 4.5 g/dL   Bilirubin Total 0.5 0.0 - 1.2 mg/dL   Alkaline Phosphatase 94 44 - 121 IU/L   AST 20 0 - 40 IU/L   ALT  15 0 - 44 IU/L  Lipid panel     Status: Abnormal   Collection Time: 04/17/23  8:54 AM  Result Value Ref Range   Cholesterol, Total 188 100 - 199 mg/dL   Triglycerides 387 0 - 149 mg/dL   HDL 48 >56 mg/dL   VLDL Cholesterol Cal 20 5 - 40 mg/dL   LDL Chol Calc (NIH) 433 (H) 0 - 99 mg/dL   Chol/HDL Ratio 3.9 0.0 - 5.0 ratio    Comment:                                   T. Chol/HDL Ratio                                             Men  Women                               1/2 Avg.Risk  3.4    3.3                                   Avg.Risk  5.0    4.4                                2X Avg.Risk  9.6    7.1                                3X Avg.Risk 23.4   11.0   TSH     Status: None   Collection Time: 04/17/23  8:54 AM  Result Value Ref Range   TSH 0.868 0.450 - 4.500 uIU/mL  T4, free     Status: None   Collection Time: 04/17/23  8:54 AM  Result Value Ref Range   Free T4 0.86 0.82 - 1.77 ng/dL   Lipid Panel     Component Value Date/Time   CHOL 188 04/17/2023 0854   TRIG 109 04/17/2023 0854   HDL 48 04/17/2023 0854   CHOLHDL 3.9 04/17/2023 0854   CHOLHDL 4.8 11/20/2017 0745   VLDL 28 03/17/2016 0714   LDLCALC 120 (H) 04/17/2023 0854   LDLCALC 152 (H) 11/20/2017 0745   LDLDIRECT 147.5 06/10/2012 0930     Assessment & Plan:   1. Type 2 diabetes mellitus with stage 2 chronic kidney disease  He presents with worsening glycemic profile due to the fact that he missed at least a third of his insulin injection opportunities.  He presents with significantly above target glycemic profile averaging 189 mg per DL.  He is CGM device shows 48%  time range, 30% level 1 hyperglycemia, 22% level 2 hyperglycemia.  He has no hypoglycemia.  His point-of-care A1c is unchanged at 8.1%.     Patient is advised to stick to the recommended insulin dose and a routine mealtimes to eat 3 meals  a day and avoid unnecessary snacks ( to snack only to correct hypoglycemia).  - he acknowledges that there is a room for improvement in his food and drink choices. - Suggestion is made for him to  avoid simple carbohydrates  from his diet including Cakes, Sweet Desserts, Ice Cream, Soda (diet and regular), Sweet Tea, Candies, Chips, Cookies, Store Bought Juices, Alcohol in Excess of  1-2 drinks a day, Artificial Sweeteners,  Coffee Creamer, and "Sugar-free" Products, Lemonade. This will help patient to have more stable blood glucose profile and potentially avoid unintended weight gain.  -He worries about hypoglycemia, and he is at risk for hypoglycemia from inadvertent use of insulin.  Priority in his care would be to avoid hypoglycemia.    -He hesitates to take higher dose of insulin.  He is advised to increase his Novolin 70/30 to 20 units with breakfast and 15 units with supper  only when his Premeal blood glucose readings are above 90 mg/day.   -He is advised to restart Jardiance 10 mg p.o. daily at breakfast.  Side effects and precautions discussed with him. - Patient is instructed to call back with extremes of blood glucose readings  less than 70 or greater than 200 mg/dl. He is benefiting, and advised to continue using his CGM device. -He is not a candidate for Metformin nor incretin therapy.    2. Mixed hyperlipidemia - His repeat lipid panel showed significant worsening in his LDL at 120 from 86.  He is advised to be consistent with his Crestor 20 mg p.o. nightly.  Side effects and precautions discussed with him.    - He is advised to continue with omega-3 fatty acids ,avoid butter and fried food and exercise regularly.  3. Essential  hypertension  -His blood pressure is controlled to target.   He is currently on amlodipine 10 mg p.o. daily, metoprolol 50 mg daily.  I discussed and added hydrochlorothiazide 25 mg p.o. daily at breakfast for better control of hypertension.    4) vitamin D deficiency -He is on ongoing supplement with vitamin D3  5000 units daily for the next 90 days.   5) multinodular goiter: -She was recently seen for work-up of multinodular goiter.  It was observed that he has had stable euthyroid multinodular goiter documented for the last 7 years.  He will not need biopsy or surgical intervention for this at this time.  7 years of stability makes it unnecessary to do another thyroid imaging.  His recent thyroid function test and within normal state, he will need yearly thyroid function test.  His recent screening ABI was negative for PAD on June 03, 2020.  His neck study will be repeated in March 2027, or sooner if needed.  Diabetes PT exam today was significant for diminished monofilament sensation as well as diminished sensation on dorsalis pedis and posterior tibial arteries.  He is advised to continue close follow-up with his PCP Dr. Assunta Found.   I spent  26 minutes in the care of the patient today including review of labs from CMP, Lipids, Thyroid Function, Hematology (current and previous including abstractions from other facilities); face-to-face time discussing  his blood glucose readings/logs, discussing hypoglycemia and hyperglycemia episodes and symptoms, medications doses, his options of short and long term treatment based on the latest standards of care / guidelines;  discussion about incorporating lifestyle medicine;  and documenting the encounter. Risk reduction counseling performed per USPSTF guidelines to reduce cardiovascular risk factors.     Please refer to Patient Instructions for Blood Glucose Monitoring and Insulin/Medications Dosing Guide"  in media tab for additional  information. Please  also refer to " Patient Self Inventory" in the Media  tab for reviewed elements of pertinent  patient history.  Hector Rose participated in the discussions, expressed understanding, and voiced agreement with the above plans.  All questions were answered to his satisfaction. he is encouraged to contact clinic should he have any questions or concerns prior to his return visit.     Follow up plan: Return in about 4 months (around 08/24/2023) for Bring Meter/CGM Device/Logs- A1c in Office.  Marquis Lunch, MD Phone: [phone]  Fax: [phone]  -  This note was partially dictated with voice recognition software. Similar sounding words can be transcribed inadequately or may not  be corrected upon review.  04/26/2023, 5:00 PM

## 2023-05-03 ENCOUNTER — Ambulatory Visit (HOSPITAL_COMMUNITY)
Admission: RE | Admit: 2023-05-03 | Discharge: 2023-05-03 | Disposition: A | Discharge status: Home / Self Care | Payer: PPO | Source: Ambulatory Visit | Attending: Neurology | Admitting: Neurology

## 2023-05-03 ENCOUNTER — Other Ambulatory Visit: Payer: Self-pay | Admitting: "Endocrinology

## 2023-05-03 DIAGNOSIS — G248 Other dystonia: Secondary | ICD-10-CM | POA: Diagnosis not present

## 2023-05-03 DIAGNOSIS — R292 Abnormal reflex: Secondary | ICD-10-CM | POA: Diagnosis not present

## 2023-05-03 DIAGNOSIS — M542 Cervicalgia: Secondary | ICD-10-CM

## 2023-05-03 DIAGNOSIS — G8929 Other chronic pain: Secondary | ICD-10-CM | POA: Diagnosis not present

## 2023-05-03 DIAGNOSIS — M50221 Other cervical disc displacement at C4-C5 level: Secondary | ICD-10-CM | POA: Diagnosis not present

## 2023-05-03 DIAGNOSIS — G249 Dystonia, unspecified: Secondary | ICD-10-CM | POA: Insufficient documentation

## 2023-05-03 DIAGNOSIS — M4802 Spinal stenosis, cervical region: Secondary | ICD-10-CM | POA: Diagnosis not present

## 2023-05-03 DIAGNOSIS — I6782 Cerebral ischemia: Secondary | ICD-10-CM | POA: Diagnosis not present

## 2023-05-03 DIAGNOSIS — R9089 Other abnormal findings on diagnostic imaging of central nervous system: Secondary | ICD-10-CM | POA: Diagnosis not present

## 2023-05-23 DIAGNOSIS — Z961 Presence of intraocular lens: Secondary | ICD-10-CM | POA: Diagnosis not present

## 2023-05-23 DIAGNOSIS — H2640 Unspecified secondary cataract: Secondary | ICD-10-CM | POA: Diagnosis not present

## 2023-05-23 DIAGNOSIS — E113293 Type 2 diabetes mellitus with mild nonproliferative diabetic retinopathy without macular edema, bilateral: Secondary | ICD-10-CM | POA: Diagnosis not present

## 2023-05-25 DIAGNOSIS — H26492 Other secondary cataract, left eye: Secondary | ICD-10-CM | POA: Diagnosis not present

## 2023-05-28 ENCOUNTER — Telehealth: Payer: Self-pay | Admitting: Neurology

## 2023-05-28 NOTE — Telephone Encounter (Signed)
 Called patient and left message.

## 2023-05-28 NOTE — Telephone Encounter (Signed)
 Spoke with patient concerning the referral to Dr. Linton Flemings. He would like to think about it and discuss with his wife and cal lus back and let us know if we need to send the referral

## 2023-05-28 NOTE — Telephone Encounter (Signed)
 Let pt know that MRI brain and cervical spine looked okay and didn't show anything acute/concerning that would cause the sx's for which he came to me.  He has some mild pinching of the nerves in the neck but nothing requiring surgical intervention.  He can let us know if he wants [place] to send that referral to Dr. 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

## 2023-06-29 DIAGNOSIS — J3089 Other allergic rhinitis: Secondary | ICD-10-CM | POA: Diagnosis not present

## 2023-06-29 DIAGNOSIS — R051 Acute cough: Secondary | ICD-10-CM | POA: Diagnosis not present

## 2023-06-29 DIAGNOSIS — Z6825 Body mass index (BMI) 25.0-25.9, adult: Secondary | ICD-10-CM | POA: Diagnosis not present

## 2023-06-29 DIAGNOSIS — E663 Overweight: Secondary | ICD-10-CM | POA: Diagnosis not present

## 2023-06-29 DIAGNOSIS — R030 Elevated blood-pressure reading, without diagnosis of hypertension: Secondary | ICD-10-CM | POA: Diagnosis not present

## 2023-07-02 DIAGNOSIS — Z20828 Contact with and (suspected) exposure to other viral communicable diseases: Secondary | ICD-10-CM | POA: Diagnosis not present

## 2023-07-02 DIAGNOSIS — R6889 Other general symptoms and signs: Secondary | ICD-10-CM | POA: Diagnosis not present

## 2023-07-02 DIAGNOSIS — E663 Overweight: Secondary | ICD-10-CM | POA: Diagnosis not present

## 2023-07-02 DIAGNOSIS — J302 Other seasonal allergic rhinitis: Secondary | ICD-10-CM | POA: Diagnosis not present

## 2023-07-02 DIAGNOSIS — M79645 Pain in left finger(s): Secondary | ICD-10-CM | POA: Diagnosis not present

## 2023-07-02 DIAGNOSIS — Z6827 Body mass index (BMI) 27.0-27.9, adult: Secondary | ICD-10-CM | POA: Diagnosis not present

## 2023-08-21 DIAGNOSIS — Z85528 Personal history of other malignant neoplasm of kidney: Secondary | ICD-10-CM | POA: Diagnosis not present

## 2023-08-24 ENCOUNTER — Encounter: Payer: Self-pay | Admitting: "Endocrinology

## 2023-08-24 ENCOUNTER — Ambulatory Visit (INDEPENDENT_AMBULATORY_CARE_PROVIDER_SITE_OTHER): Payer: PPO | Admitting: "Endocrinology

## 2023-08-24 VITALS — BP 126/62 | HR 72 | Ht 73.0 in | Wt 193.2 lb

## 2023-08-24 DIAGNOSIS — I10 Essential (primary) hypertension: Secondary | ICD-10-CM | POA: Diagnosis not present

## 2023-08-24 DIAGNOSIS — E782 Mixed hyperlipidemia: Secondary | ICD-10-CM | POA: Diagnosis not present

## 2023-08-24 DIAGNOSIS — N1831 Chronic kidney disease, stage 3a: Secondary | ICD-10-CM

## 2023-08-24 DIAGNOSIS — E1122 Type 2 diabetes mellitus with diabetic chronic kidney disease: Secondary | ICD-10-CM | POA: Diagnosis not present

## 2023-08-24 DIAGNOSIS — Z794 Long term (current) use of insulin: Secondary | ICD-10-CM | POA: Diagnosis not present

## 2023-08-24 LAB — POCT GLYCOSYLATED HEMOGLOBIN (HGB A1C): HbA1c, POC (controlled diabetic range): 7.8 % — AB (ref 0.0–7.0)

## 2023-08-24 MED ORDER — NOVOLIN 70/30 (70-30) 100 UNIT/ML ~~LOC~~ SUSP: 15.0000 [IU] | Freq: Two times a day (BID) | SUBCUTANEOUS | 1 refills | Status: DC

## 2023-08-24 NOTE — Patient Instructions (Signed)

## 2023-08-24 NOTE — Progress Notes (Signed)
 08/24/2023  Endocrinology follow-up note  Subjective:    Patient ID: Hector Rose, male    DOB: Dec 13, 1940,    Past Medical History:  Diagnosis Date   Asthma 04-11-11   AS CHILD ONLY   BPH (benign prostatic hypertrophy) with urinary obstruction    Carpal tunnel syndrome    patient denies at 05/14/17 preop visit    Difficult intubation 04-11-11   08-11-97 some issues with intubation/record with chart   DJD (degenerative joint disease)    DM (diabetes mellitus) (HCC) 04-11-11   tyep II    DVT femoral (deep venous thrombosis) with thrombophlebitis (HCC) 04-11-11   ?'09-tx. on Xarelton x 3-4 years    GERD (gastroesophageal reflux disease) 04-11-11   mild, no med in 1 month   Gout 04-11-11    ankle 2 yrs ago   Hemorrhoids    Hypercholesteremia    Hypertension    Internal hemorrhoids    Keloid 04-11-11   multiple-arms,back, chest   Pulmonary nodule 04-11-11   PT. NOT AWARE -denies problems with breathing   Renal calculus    Thyroid  disease    hypothyroid   Past Surgical History:  Procedure Laterality Date   CATARACT EXTRACTION W/PHACO Left 10/28/2021   Procedure: CATARACT EXTRACTION PHACO AND INTRAOCULAR LENS PLACEMENT (IOC);  Surgeon: Tarri Farm, MD;  Location: [facility];  Service: Ophthalmology;  Laterality: Left;  CDE: 6.61   cataract surgery  12/2013   COLONOSCOPY WITH PROPOFOL  N/A 02/05/2017   Procedure: COLONOSCOPY WITH PROPOFOL ;  Surgeon: Asencion Blacksmith, MD;  Location: [facility];  Service: Endoscopy;  Laterality: N/A;   COSMETIC SURGERY  2000   Keloid injections   CYSTOSCOPY WITH BIOPSY  04/14/2011   Procedure: CYSTOSCOPY WITH BIOPSY;  Surgeon: Kristeen Peto, MD;  Location: [facility];  Service: Urology;  Laterality: N/A;  Bladder Biopsies    CYSTOSCOPY/RETROGRADE/URETEROSCOPY  04/14/2011   Procedure: CYSTOSCOPY/RETROGRADE/URETEROSCOPY;  Surgeon: Kristeen Peto, MD;  Location: [facility];  Service: Urology;  Laterality: Bilateral;  (Bil) RPG    LAPAROSCOPIC NEPHRECTOMY Right  05/21/2017   Procedure: LAPAROSCOPIC RADICAL NEPHRECTOMY;  Surgeon: Florencio Hunting, MD;  Location: [facility];  Service: Urology;  Laterality: Right;   ortho surgery on fingers  2005 & 2008   Dr. Sypher-trigger finger release   PROSTATECTOMY  1999   Dr. Nolon Baxter   Social History   Socioeconomic History   Marital status: Married    Spouse name: Not on file   Number of children: 2   Years of education: Not on file   Highest education level: Not on file  Occupational History   Occupation: retired from [employer]  Tobacco Use   Smoking status: Never   Smokeless tobacco: Never  Vaping Use   Vaping status: Never Used  Substance and Sexual Activity   Alcohol use: No    Alcohol/week: 0.0 standard drinks of alcohol   Drug use: No   Sexual activity: Yes  Other Topics Concern   Not on file  Social History Narrative   Right handed    Retired    [employer] Strain: Not on file  Food Insecurity: Not on file  Transportation Needs: Not on file  Physical Activity: Not on file  Stress: Not on file  Social Connections: Not on file   Outpatient Encounter Medications as of 08/24/2023  Medication Sig   allopurinol  (ZYLOPRIM ) 300 MG tablet TAKE 1 TABLET BY MOUTH ONCE DAILY (Patient taking differently: daily as needed. TAKE 1 TABLET  BY MOUTH ONCE DAILY AS NEEDED)   amLODipine  (NORVASC ) 10 MG tablet TAKE 1 TABLET EVERY MORNING   aspirin EC 81 MG tablet Take 81 mg by mouth daily. Swallow whole.   Blood Glucose Monitoring Suppl (ACCU-CHEK GUIDE ME) w/Device KIT 1 Piece by Does not apply route as directed.   Continuous Blood Gluc Receiver (FREESTYLE LIBRE 2 READER) DEVI As directed   Continuous Glucose Sensor (FREESTYLE LIBRE 2 SENSOR) MISC APPLY NEW SENSOR EVERY 14 (FOURTEEN) DAYS.   diphenhydrAMINE  (BENADRYL ) 25 MG tablet Take 25 mg by mouth daily as needed for allergies.   donepezil (ARICEPT) 5 MG tablet donepezil 5 mg tablet  TAKE 1 TABLET BY MOUTH EVERYDAY AT  BEDTIME   empagliflozin  (JARDIANCE ) 10 MG TABS tablet Take 1 tablet (10 mg total) by mouth daily.   fluticasone  (FLONASE ) 50 MCG/ACT nasal spray Place 2 sprays daily as needed into both nostrils for allergies or rhinitis.   gabapentin  (NEURONTIN ) 600 MG tablet Take 600-1,200 mg by mouth See admin instructions. Take 1200 mg in the morning and 600 mg at night   hydrochlorothiazide  (HYDRODIURIL ) 25 MG tablet Take 1 tablet by mouth daily.   hydrochlorothiazide  (HYDRODIURIL ) 25 MG tablet TAKE 1 TABLET BY MOUTH EVERY DAY   HYDROcodone -acetaminophen  (NORCO/VICODIN) 5-325 MG tablet Take 1 tablet by mouth every 8 (eight) hours as needed.   insulin  NPH-regular Human (NOVOLIN  70/30) (70-30) 100 UNIT/ML injection Inject 15 Units into the skin 2 (two) times daily with a meal. Inject 20 units with breakfast, 15 units with supper when premeal blood glucose readings above 90 mg per DL .   Insulin  Syringe-Needle U-100 (BD VEO INSULIN  SYRINGE U/F) 31G X 15/64 1 ML MISC USE TWICE A DAY  To inject insulin    Lancets (ONETOUCH DELICA PLUS LANCET33G) MISC USE TO TEST BLOOD SUGAR TWICE DAILY.   metoprolol  tartrate (LOPRESSOR ) 25 MG tablet Take 25 mg by mouth 2 (two) times daily.   omeprazole  (PRILOSEC) 20 MG capsule Take 1 capsule (20 mg total) by mouth daily. (Patient taking differently: Take 20 mg by mouth daily as needed.)   ONETOUCH ULTRA test strip USE TO TEST BLOOD SUGAR TWICE DAILY   predniSONE (DELTASONE) 10 MG tablet Take by mouth.   Propylene Glycol (SYSTANE BALANCE OP) Apply 1 drop as needed to eye (irritation).   Rivaroxaban  (XARELTO ) 15 MG TABS tablet Take 15 mg by mouth daily.    [DISCONTINUED] insulin  NPH-regular Human (NOVOLIN  70/30) (70-30) 100 UNIT/ML injection Inject 15-20 Units into the skin 2 (two) times daily with a meal. Inject 20 units with breakfast, 15 units with supper when premeal blood glucose readings above 90 mg per DL .   [DISCONTINUED] simvastatin  (ZOCOR ) 40 MG tablet Takes 1/2 tablet  daily bedtime   No facility-administered encounter medications on file as of 08/24/2023.   ALLERGIES: Allergies  Allergen Reactions   Ace Inhibitors Swelling    REACTION: angio edema (swelling of lips)   Allopurinol     Metformin  And Related     Itching in higher doses    VACCINATION STATUS: Immunization History  Administered Date(s) Administered   Influenza Split 11/27/2011, 12/30/2012, 01/01/2014, 03/13/2017   Influenza Whole 01/27/2009, 11/30/2009, 11/17/2010   Influenza, High Dose Seasonal PF 12/21/2015, 01/04/2018   Pneumococcal Conjugate-13 02/04/2018   Pneumococcal Polysaccharide-23 12/02/2008    Diabetes He presents for his follow-up diabetic visit. He has type 2 diabetes mellitus. Onset time: Diagnosed approximately  at age 37. His disease course has been improving. Pertinent negatives for hypoglycemia include no  confusion, headaches, pallor or seizures. Pertinent negatives for diabetes include no chest pain, no fatigue, no polydipsia, no polyphagia, no polyuria and no weakness. Symptoms are improving. Diabetic complications include nephropathy. Risk factors for coronary artery disease include diabetes mellitus, dyslipidemia, male sex, hypertension and sedentary lifestyle. Current diabetic treatment includes insulin  injections. His weight is decreasing steadily. He is following a generally unhealthy diet. Meal planning includes ADA exchanges. He participates in exercise intermittently. His home blood glucose trend is decreasing steadily. His breakfast blood glucose range is generally 180-200 mg/dl. His lunch blood glucose range is generally 180-200 mg/dl. His dinner blood glucose range is generally 180-200 mg/dl. His bedtime blood glucose range is generally 180-200 mg/dl. His overall blood glucose range is 180-200 mg/dl. (He presents with significantly improving glycemic profile averaging 182 mg per DL for the last 14 days.   He did not take the prescribed amount of insulin  for  unclear reasons.  He is CGM device shows 55% time range, 38% level 1 hyperglycemia, 7% level 2 hyperglycemia.  He has no hypoglycemia.  His point-of-care A1c is 7.8% improving from 8.1%.       ) An ACE inhibitor/angiotensin II receptor blocker is contraindicated.  Hypertension This is a chronic problem. The current episode started more than 1 year ago. The problem is controlled. Pertinent negatives include no chest pain, headaches, neck pain, palpitations or shortness of breath. Risk factors for coronary artery disease include dyslipidemia, diabetes mellitus, male gender and sedentary lifestyle. Past treatments include calcium  channel blockers.  Hyperlipidemia This is a chronic problem. The current episode started more than 1 year ago. The problem is uncontrolled. Exacerbating diseases include diabetes. Pertinent negatives include no chest pain, myalgias or shortness of breath. Current antihyperlipidemic treatment includes bile acid squestrants. Risk factors for coronary artery disease include dyslipidemia and diabetes mellitus.    Review of systems Limited as above.   Objective:    BP 126/62   Pulse 72   Ht 6' 1 (1.854 m)   Wt 193 lb 3.2 oz (87.6 kg)   BMI 25.49 kg/m   Wt Readings from Last 3 Encounters:  08/24/23 193 lb 3.2 oz (87.6 kg)  04/26/23 200 lb 9.6 oz (91 kg)  04/24/23 201 lb (91.2 kg)      Recent Results (from the past 2160 hours)  HgB A1c     Status: Abnormal   Collection Time: 08/24/23  9:44 AM  Result Value Ref Range   Hemoglobin A1C     HbA1c POC (<> result, manual entry)     HbA1c, POC (prediabetic range)     HbA1c, POC (controlled diabetic range) 7.8 (A) 0.0 - 7.0 %   Lipid Panel     Component Value Date/Time   CHOL 188 04/17/2023 0854   TRIG 109 04/17/2023 0854   HDL 48 04/17/2023 0854   CHOLHDL 3.9 04/17/2023 0854   CHOLHDL 4.8 11/20/2017 0745   VLDL 28 03/17/2016 0714   LDLCALC 120 (H) 04/17/2023 0854   LDLCALC 152 (H) 11/20/2017 0745    LDLDIRECT 147.5 06/10/2012 0930      Latest Ref Rng & Units 04/17/2023    8:54 AM 09/13/2022   11:27 AM 01/12/2022    9:26 AM  CMP  Glucose 70 - 99 mg/dL 161  096  045   BUN 8 - 27 mg/dL 26  28  28    Creatinine 0.76 - 1.27 mg/dL 4.09  8.11  9.14   Sodium 134 - 144 mmol/L 143  141  141  Potassium 3.5 - 5.2 mmol/L 3.9  4.5  4.0   Chloride 96 - 106 mmol/L 103  97  98   CO2 20 - 29 mmol/L 25  29  29    Calcium  8.6 - 10.2 mg/dL 9.5  9.7  9.3   Total Protein 6.0 - 8.5 g/dL 6.6  6.8  6.5   Total Bilirubin 0.0 - 1.2 mg/dL 0.5  0.4  0.3   Alkaline Phos 44 - 121 IU/L 94  92  78   AST 0 - 40 IU/L 20  16  17    ALT 0 - 44 IU/L 15  17  14        Assessment & Plan:   1. Type 2 diabetes mellitus with stage 2 chronic kidney disease  He presents with worsening glycemic profile due to the fact that he missed at least a third of his insulin  injection opportunities.  He presents with significantly improving glycemic profile averaging 182 mg per DL for the last 14 days.   He did not take the prescribed amount of insulin  for unclear reasons.  He is CGM device shows 55% time range, 38% level 1 hyperglycemia, 7% level 2 hyperglycemia.  He has no hypoglycemia.  His point-of-care A1c is 7.8% improving from 8.1%.     Patient is advised to stick to the recommended insulin  dose and a routine mealtimes to eat 3 meals  a day and avoid unnecessary snacks ( to snack only to correct hypoglycemia).  - he acknowledges that there is a room for improvement in his food and drink choices. - Suggestion is made for him to avoid simple carbohydrates  from his diet including Cakes, Sweet Desserts, Ice Cream, Soda (diet and regular), Sweet Tea, Candies, Chips, Cookies, Store Bought Juices, Alcohol in Excess of  1-2 drinks a day, Artificial Sweeteners,  Coffee Creamer, and Sugar-free Products, Lemonade. This will help patient to have more stable blood glucose profile and potentially avoid unintended weight gain.  -He worries  about hypoglycemia, and he is at risk for hypoglycemia from inadvertent use of insulin .  Priority in his care would be to avoid hypoglycemia.    -He is advised to continue Novolin  70/30 at 15 units with breakfast and 15 units with supper  only when his Premeal blood glucose readings are above 90 mg/day.   -He is benefiting from Jardiance .  I have advised him to continue Jardiance  10 mg p.o. daily at breakfast.  Side effects and precautions discussed with him. - Patient is instructed to call back with extremes of blood glucose readings  less than 70 or greater than 200 mg/dl. He is benefiting, and advised to continue using his CGM device. -He is not a candidate for Metformin  nor incretin therapy.    2. Mixed hyperlipidemia - His repeat lipid panel showed significant worsening in his LDL at 120 from 86.  He is advised to be consistent with Crestor  20 mg p.o. nightly.   Side effects and precautions discussed with him.  He will be considered for fasting lipid panel before his next visit.   - He is advised to continue with omega-3 fatty acids ,avoid butter and fried food and exercise regularly.  3. Essential hypertension  - His blood pressure is controlled to target.   He is currently on amlodipine  10 mg p.o. daily, metoprolol  50 mg daily.  I discussed and added hydrochlorothiazide  25 mg p.o. daily at breakfast for better control of hypertension.    4) vitamin D  deficiency -He is on  ongoing supplement with vitamin D3  5000 units daily for the next 90 days.   5) multinodular goiter: -She was recently seen for work-up of multinodular goiter.  It was observed that he has had stable euthyroid multinodular goiter documented for the last 7 years.  He will not need biopsy or surgical intervention for this at this time.  7 years of stability makes it unnecessary to do another thyroid  imaging.  His recent thyroid  function test and within normal state, he will need yearly thyroid  function test.  His  recent screening ABI was negative for PAD on June 03, 2020.  His neck study will be repeated in March 2027, or sooner if needed.  Diabetes foot exam recently was significant for diminished monofilament sensation as well as diminished sensation on dorsalis pedis and posterior tibial arteries.  He is advised to continue close follow-up with his PCP Dr. Minus Amel.   I spent  26  minutes in the care of the patient today including review of labs from CMP, Lipids, Thyroid  Function, Hematology (current and previous including abstractions from other facilities); face-to-face time discussing  his blood glucose readings/logs, discussing hypoglycemia and hyperglycemia episodes and symptoms, medications doses, his options of short and long term treatment based on the latest standards of care / guidelines;  discussion about incorporating lifestyle medicine;  and documenting the encounter. Risk reduction counseling performed per USPSTF guidelines to reduce cardiovascular risk factors.     Please refer to Patient Instructions for Blood Glucose Monitoring and Insulin /Medications Dosing Guide  in media tab for additional information. Please  also refer to  Patient Self Inventory in the Media  tab for reviewed elements of pertinent patient history.  Hector Rose participated in the discussions, expressed understanding, and voiced agreement with the above plans.  All questions were answered to his satisfaction. he is encouraged to contact clinic should he have any questions or concerns prior to his return visit.   Follow up plan: Return in about 4 months (around 12/24/2023) for F/U with Pre-visit Labs, Meter/CGM/Logs, A1c here.  Kalvin Orf, MD Phone: [phone]  Fax: [phone]  -  This note was partially dictated with voice recognition software. Similar sounding words can be transcribed inadequately or may not  be corrected upon review.  08/24/2023, 10:41 AM

## 2023-08-28 DIAGNOSIS — D49512 Neoplasm of unspecified behavior of left kidney: Secondary | ICD-10-CM | POA: Diagnosis not present

## 2023-08-28 DIAGNOSIS — Z85528 Personal history of other malignant neoplasm of kidney: Secondary | ICD-10-CM | POA: Diagnosis not present

## 2023-08-29 ENCOUNTER — Other Ambulatory Visit: Payer: Self-pay | Admitting: Urology

## 2023-08-29 DIAGNOSIS — Z85528 Personal history of other malignant neoplasm of kidney: Secondary | ICD-10-CM

## 2023-08-29 DIAGNOSIS — D49512 Neoplasm of unspecified behavior of left kidney: Secondary | ICD-10-CM

## 2023-09-10 DIAGNOSIS — M25562 Pain in left knee: Secondary | ICD-10-CM | POA: Diagnosis not present

## 2023-09-10 DIAGNOSIS — M545 Low back pain, unspecified: Secondary | ICD-10-CM | POA: Diagnosis not present

## 2023-09-10 DIAGNOSIS — G8929 Other chronic pain: Secondary | ICD-10-CM | POA: Diagnosis not present

## 2023-09-10 DIAGNOSIS — M25561 Pain in right knee: Secondary | ICD-10-CM | POA: Diagnosis not present

## 2023-09-10 DIAGNOSIS — M170 Bilateral primary osteoarthritis of knee: Secondary | ICD-10-CM | POA: Diagnosis not present

## 2023-09-18 ENCOUNTER — Telehealth: Payer: Self-pay

## 2023-09-18 NOTE — Telephone Encounter (Signed)
 Pt came by the office stating he had been experiencing hypoglycemic episodes today. States he takes Novolin  70/30 15 units bid and Jardiance  10mg  daily but has not taken any insulin  today. Pt's CGM data printed and evaluated by Dr.Nida. Advised pt to lower his Novolin  70/30 to 10 units bid with meals only if he is eating and continue taking Jardiance  10mg  daily per Dr.Nida's orders. Pt and his wife voiced understanding.

## 2023-09-19 ENCOUNTER — Telehealth: Payer: Self-pay

## 2023-09-19 NOTE — Telephone Encounter (Signed)
 Pt came by the office stating he continues to experience hypoglycemic episodes. States he did not inject any insulin  yesterday and has not injected any insulin  this morning. States he ate supper approximately 5:00pm yesterday and drank an Ensure supplement drink approximately around 9pm last night. States his CGM alarmed at 5:00am this morning showing his glucose in the 60s. Pt states he has eaten breakfast this morning and has taken his Jardiance  10mg  this morning. Pt's CGM data uploaded, printed and given to Dr.Nida. Dr.Nida evaluated CGM data. Advised pt to continue to hold insulin  and continue taking Jardiance  10mg  daily per Dr.Nida's orders. Pt voiced understanding.

## 2023-09-20 ENCOUNTER — Telehealth: Payer: Self-pay | Admitting: *Deleted

## 2023-09-20 NOTE — Telephone Encounter (Signed)
 Patient and his wife presented to the office , as they were concerned about the readings he was getting on his CGM. Per his wife he had a drop after eating today, it was in the low 50's per her. The patient and wife had talked with Zada Sharps, LPN on [id number]/90/7974 at which time his CGM data was printed and review by Dr. Lenis. Patient was advised to stop his Novolin  70/30. He had been injecting 15 units with breakfast and at dinner time. Patient did verify that he had stopped it on 09/18/2023.  The only medication that the patient is taking is Jardiance  10 mg daily. Patient request a finger stick, this was done and his reading was 235. He scanned his CGM and it measured his blood sugar as 67. It was explained to the patient and his wife that the sensor in his arm measures interstitial fluid and his finger measure capillary. That there can be 20-40 point difference and sometimes a little more.  Dr Lenis reviewed the CGM data. He advised that the patient is running tight, but he is off the insulin  and this would help the drops. He explained that the Jardiance   does not drop the blood sugar.  I shared this with the patient and his wife. They were also advised that when he has a blood sugar that is low or high, and he is not feeling that it is low or high. He should check it with his meter, stick his finger. He said that he would do this and keep a record. Patient was advised that as long as he had a sensor in that he could call our office and share what is going on and we could print the information out for Dr. Lenis to review.  I ask the patient where did he have his sensor, he had it placed anterior to the humerus on the left arm. I showed  him the preferred places. I also shared that if the lows continued, that he may want to replace the sensor as it may be a bad one. To call [facility] and share that and that they would replace it.  He and his wife verified understanding.

## 2023-09-21 ENCOUNTER — Telehealth: Payer: Self-pay

## 2023-09-21 NOTE — Telephone Encounter (Signed)
 Pt called stating he changed his sensor to his CGM. States his glucose readings are better and has not experienced any hypoglycemic episodes. States he did have one reading in the 200s and he injected Novolin  70/30 5 units this morning and has taken his Jardiance  10mg . Pt's CGM data uploaded and printed. Dr.Nida evaluated CGM data. Advised pt to continue injecting Novolin  70/30 5 units twice daily and taking Jardiance  10mg  daily per Dr.Nida's orders. Pt voiced understanding.

## 2023-10-05 ENCOUNTER — Ambulatory Visit
Admission: RE | Admit: 2023-10-05 | Discharge: 2023-10-05 | Disposition: A | Discharge status: Home / Self Care | Source: Ambulatory Visit | Attending: Urology | Admitting: Urology

## 2023-10-05 DIAGNOSIS — Z905 Acquired absence of kidney: Secondary | ICD-10-CM | POA: Diagnosis not present

## 2023-10-05 DIAGNOSIS — Z85528 Personal history of other malignant neoplasm of kidney: Secondary | ICD-10-CM

## 2023-10-05 DIAGNOSIS — D49512 Neoplasm of unspecified behavior of left kidney: Secondary | ICD-10-CM

## 2023-10-05 DIAGNOSIS — N2889 Other specified disorders of kidney and ureter: Secondary | ICD-10-CM | POA: Diagnosis not present

## 2023-10-05 MED ORDER — GADOPICLENOL 0.5 MMOL/ML IV SOLN
8.0000 mL | Freq: Once | INTRAVENOUS | Status: AC | PRN
  Administered 2023-10-05: 8 mL via INTRAVENOUS

## 2023-11-19 ENCOUNTER — Other Ambulatory Visit: Payer: Self-pay | Admitting: "Endocrinology

## 2023-11-20 NOTE — Progress Notes (Unsigned)
 Ellouise Console, PA-C [address] [place], [place]  [address] Phone: [phone]   Gastroenterology Consultation  Referring Provider:     Marvine Rush, MD Primary Care Physician:  Marvine Rush, MD Primary Gastroenterologist:  Ellouise Console, PA-C / Elspeth Naval, MD  Reason for Consultation:     Dysphagia        HPI:   Hector Rose is a 83 y.o. y/o male referred for consultation & management  by Marvine Rush, MD.    Patient is here to evaluate dysphagia.  History of GERD and takes Prilosec 20 mg once daily.  No previous EGD  05/2016 barium swallow with tablet (to evaluate dysphagia):  Laryngeal penetration without aspiration.  Mild diffuse esophageal dysmotility.  No esophageal mass or stricture.  01/2017 last colonoscopy by Dr. Aneita: 4 small (5 mm to 6 mm) polyps removed.  Small internal hemorrhoids.  Good prep.  Pathology showed 2 tubular adenomas, 1 hyperplastic, and 1 colon mucosal polyp.  Consider 5-year repeat colonoscopy pending health status.  PMH: History of DVT, type 2 diabetes, CKD 3, hypertension, right kidney cancer s/p nephrectomy, GERD, and hemorrhoids.  Currently on Xarelto  and 81 mg aspirin daily.  Past Medical History:  Diagnosis Date   Asthma 04-11-11   AS CHILD ONLY   BPH (benign prostatic hypertrophy) with urinary obstruction    Carpal tunnel syndrome    patient denies at 05/14/17 preop visit    Difficult intubation 04-11-11   08-11-97 some issues with intubation/record with chart   DJD (degenerative joint disease)    DM (diabetes mellitus) (HCC) 04-11-11   tyep II    DVT femoral (deep venous thrombosis) with thrombophlebitis (HCC) 04-11-11   ?'09-tx. on Xarelton x 3-4 years    GERD (gastroesophageal reflux disease) 04-11-11   mild, no med in 1 month   Gout 04-11-11    ankle 2 yrs ago   Hemorrhoids    Hypercholesteremia    Hypertension    Internal hemorrhoids    Keloid 04-11-11   multiple-arms,back, chest   Pulmonary nodule 04-11-11   PT.  NOT AWARE -denies problems with breathing   Renal calculus    Thyroid  disease    hypothyroid    Past Surgical History:  Procedure Laterality Date   CATARACT EXTRACTION W/PHACO Left 10/28/2021   Procedure: CATARACT EXTRACTION PHACO AND INTRAOCULAR LENS PLACEMENT (IOC);  Surgeon: Harrie Agent, MD;  Location: [facility];  Service: Ophthalmology;  Laterality: Left;  CDE: 6.61   cataract surgery  12/2013   COLONOSCOPY WITH PROPOFOL  N/A 02/05/2017   Procedure: COLONOSCOPY WITH PROPOFOL ;  Surgeon: Aneita Gwendlyn DASEN, MD;  Location: [facility];  Service: Endoscopy;  Laterality: N/A;   COSMETIC SURGERY  2000   Keloid injections   CYSTOSCOPY WITH BIOPSY  04/14/2011   Procedure: CYSTOSCOPY WITH BIOPSY;  Surgeon: Noretta Ferrara, MD;  Location: [facility];  Service: Urology;  Laterality: N/A;  Bladder Biopsies    CYSTOSCOPY/RETROGRADE/URETEROSCOPY  04/14/2011   Procedure: CYSTOSCOPY/RETROGRADE/URETEROSCOPY;  Surgeon: Noretta Ferrara, MD;  Location: [facility];  Service: Urology;  Laterality: Bilateral;  (Bil) RPG    LAPAROSCOPIC NEPHRECTOMY Right 05/21/2017   Procedure: LAPAROSCOPIC RADICAL NEPHRECTOMY;  Surgeon: Ferrara Glance, MD;  Location: [facility];  Service: Urology;  Laterality: Right;   ortho surgery on fingers  2005 & 2008   Dr. Jearld finger release   PROSTATECTOMY  1999   Dr. Nicholaus    Prior to Admission medications   Medication Sig Start Date End Date Taking? Authorizing  Provider  allopurinol  (ZYLOPRIM ) 300 MG tablet TAKE 1 TABLET BY MOUTH ONCE DAILY Patient taking differently: daily as needed. TAKE 1 TABLET BY MOUTH ONCE DAILY AS NEEDED 06/06/13   Christi Glendia HERO, MD  amLODipine  (NORVASC ) 10 MG tablet TAKE 1 TABLET EVERY MORNING    Christi Glendia HERO, MD  aspirin EC 81 MG tablet Take 81 mg by mouth daily. Swallow whole.    [provider]  Blood Glucose Monitoring Suppl (ACCU-CHEK GUIDE ME) w/Device KIT 1 Piece by Does not apply route as directed. 06/03/20   Nida, Gebreselassie W, MD  Continuous  Blood Gluc Receiver (FREESTYLE LIBRE 2 READER) DEVI As directed 10/14/21   Nida, Gebreselassie W, MD  Continuous Glucose Sensor (FREESTYLE LIBRE 2 SENSOR) MISC APPLY NEW SENSOR EVERY 14 (FOURTEEN) DAYS. 03/15/23   Nida, Gebreselassie W, MD  diphenhydrAMINE  (BENADRYL ) 25 MG tablet Take 25 mg by mouth daily as needed for allergies.    [provider]  donepezil (ARICEPT) 5 MG tablet donepezil 5 mg tablet  TAKE 1 TABLET BY MOUTH EVERYDAY AT BEDTIME    [provider]  fluticasone  (FLONASE ) 50 MCG/ACT nasal spray Place 2 sprays daily as needed into both nostrils for allergies or rhinitis.    [provider]  gabapentin  (NEURONTIN ) 600 MG tablet Take 600-1,200 mg by mouth See admin instructions. Take 1200 mg in the morning and 600 mg at night    [provider]  hydrochlorothiazide  (HYDRODIURIL ) 25 MG tablet Take 1 tablet by mouth daily. 10/05/20   [provider]  hydrochlorothiazide  (HYDRODIURIL ) 25 MG tablet TAKE 1 TABLET BY MOUTH EVERY DAY 05/03/23   Nida, Gebreselassie W, MD  HYDROcodone -acetaminophen  (NORCO/VICODIN) 5-325 MG tablet Take 1 tablet by mouth every 8 (eight) hours as needed. 04/03/23   [provider]  insulin  NPH-regular Human (NOVOLIN  70/30) (70-30) 100 UNIT/ML injection Inject 15 Units into the skin 2 (two) times daily with a meal. Inject 20 units with breakfast, 15 units with supper when premeal blood glucose readings above 90 mg per DL . 3/86/74   Nida, Gebreselassie W, MD  Insulin  Syringe-Needle U-100 (BD VEO INSULIN  SYRINGE U/F) 31G X 15/64 1 ML MISC USE TWICE A DAY  To inject insulin  06/03/20   Nida, Gebreselassie W, MD  JARDIANCE  10 MG TABS tablet TAKE 1 TABLET BY MOUTH EVERY DAY 11/19/23   Nida, Gebreselassie W, MD  Lancets (ONETOUCH DELICA PLUS LANCET33G) MISC USE TO TEST BLOOD SUGAR TWICE DAILY. 06/03/20   Nida, Gebreselassie W, MD  metoprolol  tartrate (LOPRESSOR ) 25 MG tablet Take 25 mg by mouth 2 (two) times daily.    [provider]  omeprazole  (PRILOSEC) 20 MG capsule Take 1 capsule (20 mg total) by mouth daily. Patient taking differently: Take 20 mg by mouth daily as needed. 02/10/20   Theadore Ozell HERO, MD  [facility] ULTRA test strip USE TO TEST BLOOD SUGAR TWICE DAILY 03/28/22   Nida, Gebreselassie W, MD  predniSONE (DELTASONE) 10 MG tablet Take by mouth.    [provider]  Propylene Glycol (SYSTANE BALANCE OP) Apply 1 drop as needed to eye (irritation).    [provider]  Rivaroxaban  (XARELTO ) 15 MG TABS tablet Take 15 mg by mouth daily.     [provider]  simvastatin  (ZOCOR ) 40 MG tablet Takes 1/2 tablet daily bedtime 02/13/11 05/19/11  Christi Glendia HERO, MD    No family history on file.   Social History   Tobacco Use   Smoking status: Never  Smokeless tobacco: Never  Vaping Use   Vaping status: Never Used  Substance Use Topics   Alcohol use: No    Alcohol/week: 0.0 standard drinks of alcohol   Drug use: No    Allergies as of 11/21/2023 - Review Complete 08/24/2023  Allergen Reaction Noted   Ace inhibitors Swelling    Allopurinol   09/03/2020   Metformin  and related  06/30/2013    Review of Systems:    All systems reviewed and negative except where noted in HPI.   Physical Exam:  There were no vitals taken for this visit. No LMP for male patient.  General:   Alert,  Well-developed, well-nourished, pleasant and cooperative in NAD Lungs:  Respirations even and unlabored.  Clear throughout to auscultation.   No wheezes, crackles, or rhonchi. No acute distress. Heart:  Regular rate and rhythm; no murmurs, clicks, rubs, or gallops. Abdomen:  Normal bowel sounds.  No bruits.  Soft, and non-distended without masses, hepatosplenomegaly or hernias noted.  No Tenderness.  No guarding or rebound tenderness.    Neurologic:  Alert and oriented x3;  grossly normal neurologically. Psych:  Alert and cooperative. Normal mood and affect.  Imaging Studies: No results  found.  Labs: CBC    Component Value Date/Time   WBC 6.6 02/10/2020 1414   RBC 4.61 02/10/2020 1414   HGB 14.6 02/10/2020 1414   HCT 43.2 02/10/2020 1414   PLT 152 02/10/2020 1414   MCV 93.7 02/10/2020 1414    CMP     Component Value Date/Time   NA 143 04/17/2023 0854   K 3.9 04/17/2023 0854   CL 103 04/17/2023 0854   CO2 25 04/17/2023 0854   GLUCOSE 112 (H) 04/17/2023 0854   GLUCOSE 113 (H) 02/10/2020 1414   BUN 26 04/17/2023 0854   CREATININE 1.95 (H) 04/17/2023 0854   CREATININE 2.06 (H) 06/27/2019 1001   CALCIUM  9.5 04/17/2023 0854   PROT 6.6 04/17/2023 0854   ALBUMIN 4.1 04/17/2023 0854   AST 20 04/17/2023 0854   ALT 15 04/17/2023 0854   ALKPHOS 94 04/17/2023 0854   BILITOT 0.5 04/17/2023 0854   GFRNONAA 32 (L) 02/10/2020 1414   GFRNONAA 33 (L) 02/26/2019 1510   GFRAA 35 (L) 10/29/2019 1502   GFRAA 38 (L) 02/26/2019 1510    Assessment and Plan:   Dwan Hemmelgarn is a 83 y.o. y/o male has been referred for ***  Follow up ***  Ellouise Console, PA-C

## 2023-11-21 ENCOUNTER — Ambulatory Visit (INDEPENDENT_AMBULATORY_CARE_PROVIDER_SITE_OTHER): Admitting: Physician Assistant

## 2023-11-21 ENCOUNTER — Encounter: Payer: Self-pay | Admitting: Physician Assistant

## 2023-11-21 ENCOUNTER — Other Ambulatory Visit: Payer: Self-pay | Admitting: "Endocrinology

## 2023-11-21 VITALS — BP 140/70 | HR 75 | Ht 72.0 in | Wt 195.0 lb

## 2023-11-21 DIAGNOSIS — N1831 Chronic kidney disease, stage 3a: Secondary | ICD-10-CM

## 2023-11-21 DIAGNOSIS — R1310 Dysphagia, unspecified: Secondary | ICD-10-CM

## 2023-11-21 DIAGNOSIS — K219 Gastro-esophageal reflux disease without esophagitis: Secondary | ICD-10-CM | POA: Diagnosis not present

## 2023-11-21 NOTE — Progress Notes (Signed)
 Agree with assessment and plan as outlined.  Sounds like he had a complicated EGD in the past with mucosal rent leading to endoscopic therapy for treatment and then subsequent intubation for laryngospasm.  I agree with doing a barium swallow first to further evaluate this.  If he does need an EGD I probably would do this at the hospital for anesthesia support given complications from anesthesia/laryngospasm in the past

## 2023-11-21 NOTE — Patient Instructions (Signed)
 You have been scheduled for a Barium Esophogram at [facility] (1st floor of the hospital) on 12/05/23 at 10:00 am. Please arrive 30 minutes prior to your appointment for registration. Make certain not to have anything to eat or drink 3 hours prior to your test. If you need to reschedule for any reason, please contact radiology at [phone] to do so. __________________________________________________________________ A barium swallow is an examination that concentrates on views of the esophagus. This tends to be a double contrast exam (barium and two liquids which, when combined, create a gas to distend the wall of the oesophagus) or single contrast (non-ionic iodine  based). The study is usually tailored to your symptoms so a good history is essential. Attention is paid during the study to the form, structure and configuration of the esophagus, looking for functional disorders (such as aspiration, dysphagia, achalasia, motility and reflux) EXAMINATION You may be asked to change into a gown, depending on the type of swallow being performed. A radiologist and radiographer will perform the procedure. The radiologist will advise you of the type of contrast selected for your procedure and direct you during the exam. You will be asked to stand, sit or lie in several different positions and to hold a small amount of fluid in your mouth before being asked to swallow while the imaging is performed .In some instances you may be asked to swallow barium coated marshmallows to assess the motility of a solid food bolus. The exam can be recorded as a digital or video fluoroscopy procedure. POST PROCEDURE It will take 1-2 days for the barium to pass through your system. To facilitate this, it is important, unless otherwise directed, to increase your fluids for the next 24-48hrs and to resume your normal diet.  This test typically takes about 30 minutes to  perform. __________________________________________________________________________________  Please follow up sooner if symptoms increase or worsen  Due to recent changes in healthcare laws, you may see the results of your imaging and laboratory studies on MyChart before your provider has had a chance to review them.  We understand that in some cases there may be results that are confusing or concerning to you. Not all laboratory results come back in the same time frame and the provider may be waiting for multiple results in order to interpret others.  Please give us  48 hours in order for your provider to thoroughly review all the results before contacting the office for clarification of your results.   Thank you for trusting me with your gastrointestinal care!   Ellouise Console, PA-C _______________________________________________________  If your blood pressure at your visit was 140/90 or greater, please contact your primary care physician to follow up on this.  _______________________________________________________  If you are age 35 or older, your body mass index should be between 23-30. Your Body mass index is 26.45 kg/m. If this is out of the aforementioned range listed, please consider follow up with your Primary Care Provider.  If you are age 52 or younger, your body mass index should be between 19-25. Your Body mass index is 26.45 kg/m. If this is out of the aformentioned range listed, please consider follow up with your Primary Care Provider.   ________________________________________________________  The [facility] providers would like to encourage you to use MYCHART to communicate with providers for non-urgent requests or questions.  Due to long hold times on the telephone, sending your provider a message by [facility] may be a faster and more efficient way to get a response.  Please allow 48 business hours for a response.  Please remember that this is for non-urgent requests.   _______________________________________________________

## 2023-11-26 ENCOUNTER — Ambulatory Visit (HOSPITAL_COMMUNITY)
Admission: RE | Admit: 2023-11-26 | Discharge: 2023-11-26 | Disposition: A | Discharge status: Home / Self Care | Source: Ambulatory Visit | Attending: Physician Assistant | Admitting: Physician Assistant

## 2023-11-26 ENCOUNTER — Inpatient Hospital Stay (HOSPITAL_COMMUNITY): Admission: RE | Admit: 2023-11-26 | Source: Ambulatory Visit

## 2023-11-26 DIAGNOSIS — R1310 Dysphagia, unspecified: Secondary | ICD-10-CM | POA: Diagnosis not present

## 2023-11-26 DIAGNOSIS — K224 Dyskinesia of esophagus: Secondary | ICD-10-CM | POA: Diagnosis not present

## 2023-11-26 DIAGNOSIS — K219 Gastro-esophageal reflux disease without esophagitis: Secondary | ICD-10-CM | POA: Diagnosis not present

## 2023-11-28 ENCOUNTER — Ambulatory Visit: Payer: Self-pay | Admitting: Physician Assistant

## 2023-11-28 DIAGNOSIS — L03031 Cellulitis of right toe: Secondary | ICD-10-CM | POA: Diagnosis not present

## 2023-11-28 DIAGNOSIS — Z6826 Body mass index (BMI) 26.0-26.9, adult: Secondary | ICD-10-CM | POA: Diagnosis not present

## 2023-11-28 DIAGNOSIS — M109 Gout, unspecified: Secondary | ICD-10-CM | POA: Diagnosis not present

## 2023-12-01 ENCOUNTER — Inpatient Hospital Stay (HOSPITAL_COMMUNITY)
Admission: EM | Admit: 2023-12-01 | Discharge: 2023-12-06 | DRG: 253 | Disposition: A | Discharge status: Home Health | Attending: Family Medicine | Admitting: Family Medicine

## 2023-12-01 ENCOUNTER — Emergency Department (HOSPITAL_COMMUNITY)

## 2023-12-01 ENCOUNTER — Other Ambulatory Visit: Payer: Self-pay

## 2023-12-01 ENCOUNTER — Encounter (HOSPITAL_COMMUNITY): Payer: Self-pay

## 2023-12-01 DIAGNOSIS — L98499 Non-pressure chronic ulcer of skin of other sites with unspecified severity: Secondary | ICD-10-CM | POA: Diagnosis present

## 2023-12-01 DIAGNOSIS — E1142 Type 2 diabetes mellitus with diabetic polyneuropathy: Secondary | ICD-10-CM | POA: Diagnosis present

## 2023-12-01 DIAGNOSIS — Z794 Long term (current) use of insulin: Secondary | ICD-10-CM | POA: Diagnosis not present

## 2023-12-01 DIAGNOSIS — I739 Peripheral vascular disease, unspecified: Secondary | ICD-10-CM | POA: Diagnosis not present

## 2023-12-01 DIAGNOSIS — N184 Chronic kidney disease, stage 4 (severe): Secondary | ICD-10-CM | POA: Diagnosis present

## 2023-12-01 DIAGNOSIS — E1140 Type 2 diabetes mellitus with diabetic neuropathy, unspecified: Secondary | ICD-10-CM | POA: Diagnosis not present

## 2023-12-01 DIAGNOSIS — I70221 Atherosclerosis of native arteries of extremities with rest pain, right leg: Secondary | ICD-10-CM | POA: Diagnosis present

## 2023-12-01 DIAGNOSIS — K219 Gastro-esophageal reflux disease without esophagitis: Secondary | ICD-10-CM | POA: Diagnosis present

## 2023-12-01 DIAGNOSIS — E1165 Type 2 diabetes mellitus with hyperglycemia: Secondary | ICD-10-CM | POA: Diagnosis present

## 2023-12-01 DIAGNOSIS — M7989 Other specified soft tissue disorders: Secondary | ICD-10-CM | POA: Diagnosis not present

## 2023-12-01 DIAGNOSIS — I82411 Acute embolism and thrombosis of right femoral vein: Secondary | ICD-10-CM | POA: Diagnosis not present

## 2023-12-01 DIAGNOSIS — I82511 Chronic embolism and thrombosis of right femoral vein: Secondary | ICD-10-CM | POA: Diagnosis not present

## 2023-12-01 DIAGNOSIS — I10 Essential (primary) hypertension: Secondary | ICD-10-CM | POA: Diagnosis not present

## 2023-12-01 DIAGNOSIS — M86171 Other acute osteomyelitis, right ankle and foot: Secondary | ICD-10-CM | POA: Diagnosis not present

## 2023-12-01 DIAGNOSIS — Z8672 Personal history of thrombophlebitis: Secondary | ICD-10-CM

## 2023-12-01 DIAGNOSIS — N189 Chronic kidney disease, unspecified: Secondary | ICD-10-CM | POA: Diagnosis not present

## 2023-12-01 DIAGNOSIS — I129 Hypertensive chronic kidney disease with stage 1 through stage 4 chronic kidney disease, or unspecified chronic kidney disease: Secondary | ICD-10-CM | POA: Diagnosis present

## 2023-12-01 DIAGNOSIS — M1990 Unspecified osteoarthritis, unspecified site: Secondary | ICD-10-CM | POA: Diagnosis present

## 2023-12-01 DIAGNOSIS — Z79899 Other long term (current) drug therapy: Secondary | ICD-10-CM

## 2023-12-01 DIAGNOSIS — E1151 Type 2 diabetes mellitus with diabetic peripheral angiopathy without gangrene: Secondary | ICD-10-CM | POA: Diagnosis not present

## 2023-12-01 DIAGNOSIS — B957 Other staphylococcus as the cause of diseases classified elsewhere: Secondary | ICD-10-CM | POA: Diagnosis present

## 2023-12-01 DIAGNOSIS — I82431 Acute embolism and thrombosis of right popliteal vein: Secondary | ICD-10-CM | POA: Diagnosis not present

## 2023-12-01 DIAGNOSIS — N138 Other obstructive and reflux uropathy: Secondary | ICD-10-CM | POA: Diagnosis present

## 2023-12-01 DIAGNOSIS — B952 Enterococcus as the cause of diseases classified elsewhere: Secondary | ICD-10-CM | POA: Diagnosis not present

## 2023-12-01 DIAGNOSIS — Z9841 Cataract extraction status, right eye: Secondary | ICD-10-CM

## 2023-12-01 DIAGNOSIS — M869 Osteomyelitis, unspecified: Secondary | ICD-10-CM | POA: Diagnosis not present

## 2023-12-01 DIAGNOSIS — L97519 Non-pressure chronic ulcer of other part of right foot with unspecified severity: Secondary | ICD-10-CM | POA: Diagnosis not present

## 2023-12-01 DIAGNOSIS — I82501 Chronic embolism and thrombosis of unspecified deep veins of right lower extremity: Secondary | ICD-10-CM | POA: Diagnosis not present

## 2023-12-01 DIAGNOSIS — E039 Hypothyroidism, unspecified: Secondary | ICD-10-CM | POA: Diagnosis present

## 2023-12-01 DIAGNOSIS — N401 Enlarged prostate with lower urinary tract symptoms: Secondary | ICD-10-CM | POA: Diagnosis present

## 2023-12-01 DIAGNOSIS — N179 Acute kidney failure, unspecified: Secondary | ICD-10-CM | POA: Diagnosis not present

## 2023-12-01 DIAGNOSIS — Z9889 Other specified postprocedural states: Secondary | ICD-10-CM

## 2023-12-01 DIAGNOSIS — I70235 Atherosclerosis of native arteries of right leg with ulceration of other part of foot: Secondary | ICD-10-CM | POA: Diagnosis not present

## 2023-12-01 DIAGNOSIS — L0390 Cellulitis, unspecified: Secondary | ICD-10-CM

## 2023-12-01 DIAGNOSIS — E7800 Pure hypercholesterolemia, unspecified: Secondary | ICD-10-CM | POA: Diagnosis present

## 2023-12-01 DIAGNOSIS — I82419 Acute embolism and thrombosis of unspecified femoral vein: Secondary | ICD-10-CM | POA: Diagnosis not present

## 2023-12-01 DIAGNOSIS — Z7902 Long term (current) use of antithrombotics/antiplatelets: Secondary | ICD-10-CM

## 2023-12-01 DIAGNOSIS — Z7984 Long term (current) use of oral hypoglycemic drugs: Secondary | ICD-10-CM

## 2023-12-01 DIAGNOSIS — J45909 Unspecified asthma, uncomplicated: Secondary | ICD-10-CM | POA: Diagnosis present

## 2023-12-01 DIAGNOSIS — E11621 Type 2 diabetes mellitus with foot ulcer: Secondary | ICD-10-CM | POA: Diagnosis not present

## 2023-12-01 DIAGNOSIS — Z85528 Personal history of other malignant neoplasm of kidney: Secondary | ICD-10-CM

## 2023-12-01 DIAGNOSIS — E1122 Type 2 diabetes mellitus with diabetic chronic kidney disease: Secondary | ICD-10-CM | POA: Diagnosis not present

## 2023-12-01 DIAGNOSIS — I82531 Chronic embolism and thrombosis of right popliteal vein: Secondary | ICD-10-CM | POA: Diagnosis not present

## 2023-12-01 DIAGNOSIS — S91104A Unspecified open wound of right lesser toe(s) without damage to nail, initial encounter: Secondary | ICD-10-CM | POA: Diagnosis not present

## 2023-12-01 DIAGNOSIS — M109 Gout, unspecified: Secondary | ICD-10-CM | POA: Diagnosis present

## 2023-12-01 DIAGNOSIS — L03115 Cellulitis of right lower limb: Secondary | ICD-10-CM | POA: Diagnosis not present

## 2023-12-01 DIAGNOSIS — I82561 Chronic embolism and thrombosis of right calf muscular vein: Secondary | ICD-10-CM | POA: Diagnosis not present

## 2023-12-01 DIAGNOSIS — L97514 Non-pressure chronic ulcer of other part of right foot with necrosis of bone: Secondary | ICD-10-CM | POA: Diagnosis present

## 2023-12-01 DIAGNOSIS — Z9862 Peripheral vascular angioplasty status: Secondary | ICD-10-CM | POA: Diagnosis not present

## 2023-12-01 DIAGNOSIS — Z961 Presence of intraocular lens: Secondary | ICD-10-CM | POA: Diagnosis present

## 2023-12-01 DIAGNOSIS — Z7982 Long term (current) use of aspirin: Secondary | ICD-10-CM

## 2023-12-01 DIAGNOSIS — Z87442 Personal history of urinary calculi: Secondary | ICD-10-CM

## 2023-12-01 DIAGNOSIS — Z86718 Personal history of other venous thrombosis and embolism: Secondary | ICD-10-CM

## 2023-12-01 DIAGNOSIS — Z905 Acquired absence of kidney: Secondary | ICD-10-CM

## 2023-12-01 DIAGNOSIS — L089 Local infection of the skin and subcutaneous tissue, unspecified: Secondary | ICD-10-CM | POA: Diagnosis not present

## 2023-12-01 DIAGNOSIS — E1169 Type 2 diabetes mellitus with other specified complication: Secondary | ICD-10-CM | POA: Diagnosis present

## 2023-12-01 DIAGNOSIS — Z9079 Acquired absence of other genital organ(s): Secondary | ICD-10-CM

## 2023-12-01 DIAGNOSIS — Z888 Allergy status to other drugs, medicaments and biological substances status: Secondary | ICD-10-CM

## 2023-12-01 DIAGNOSIS — Z7901 Long term (current) use of anticoagulants: Secondary | ICD-10-CM

## 2023-12-01 DIAGNOSIS — I824Z1 Acute embolism and thrombosis of unspecified deep veins of right distal lower extremity: Secondary | ICD-10-CM | POA: Diagnosis not present

## 2023-12-01 DIAGNOSIS — N289 Disorder of kidney and ureter, unspecified: Secondary | ICD-10-CM

## 2023-12-01 LAB — BASIC METABOLIC PANEL WITH GFR
Anion gap: 10 (ref 5–15)
BUN: 22 mg/dL (ref 8–23)
CO2: 27 mmol/L (ref 22–32)
Calcium: 8.8 mg/dL — ABNORMAL LOW (ref 8.9–10.3)
Chloride: 104 mmol/L (ref 98–111)
Creatinine, Ser: 2.2 mg/dL — ABNORMAL HIGH (ref 0.61–1.24)
GFR, Estimated: 29 mL/min — ABNORMAL LOW (ref >60)
Glucose, Bld: 232 mg/dL — ABNORMAL HIGH (ref 70–99)
Potassium: 4 mmol/L (ref 3.5–5.1)
Sodium: 141 mmol/L (ref 135–145)

## 2023-12-01 LAB — CBC WITH DIFFERENTIAL/PLATELET
Abs Immature Granulocytes: 0.03 K/uL (ref 0.00–0.07)
Basophils Absolute: 0 K/uL (ref 0.0–0.1)
Basophils Relative: 0 %
Eosinophils Absolute: 0.2 K/uL (ref 0.0–0.5)
Eosinophils Relative: 3 %
HCT: 41.1 % (ref 39.0–52.0)
Hemoglobin: 13.9 g/dL (ref 13.0–17.0)
Immature Granulocytes: 0 %
Lymphocytes Relative: 17 %
Lymphs Abs: 1.2 K/uL (ref 0.7–4.0)
MCH: 31.2 pg (ref 26.0–34.0)
MCHC: 33.8 g/dL (ref 30.0–36.0)
MCV: 92.2 fL (ref 80.0–100.0)
Monocytes Absolute: 0.6 K/uL (ref 0.1–1.0)
Monocytes Relative: 9 %
Neutro Abs: 4.7 K/uL (ref 1.7–7.7)
Neutrophils Relative %: 71 %
Platelets: 198 K/uL (ref 150–400)
RBC: 4.46 MIL/uL (ref 4.22–5.81)
RDW: 12.4 % (ref 11.5–15.5)
WBC: 6.8 K/uL (ref 4.0–10.5)
nRBC: 0 % (ref 0.0–0.2)

## 2023-12-01 NOTE — ED Provider Notes (Signed)
 [place] EMERGENCY DEPARTMENT AT [facility] Provider Note   CSN: [id number] Arrival date & time: 12/01/23  2145     Patient presents with: Leg Swelling   Hector Rose is a 83 y.o. male.  {Add pertinent medical, surgical, social history, OB history to YEP:[id number]} The history is provided by the patient.   He has history of hypertension, diabetes, hyperlipidemia, gout, GERD, DVT anticoagulated on rivaroxaban  and comes in because of swelling of his right foot and sore on his right second toe.  He had a shoe that was rubbing against his toe about a week ago and he started having swelling in the right foot at about the same time.  He saw his primary care provider 2 days ago who gave him an antibiotic injection and started him on cephalexin 250 mg twice a day.  Because the swelling did not seem to be improving, today the cephalexin was increased to 500 mg twice a day.  He denies fever, chills, sweats.  He denies any pain.    Prior to Admission medications   Medication Sig Start Date End Date Taking? Authorizing Provider  allopurinol  (ZYLOPRIM ) 300 MG tablet TAKE 1 TABLET BY MOUTH ONCE DAILY Patient taking differently: daily as needed. TAKE 1 TABLET BY MOUTH ONCE DAILY AS NEEDED 06/06/13   Christi Glendia HERO, MD  amLODipine  (NORVASC ) 10 MG tablet TAKE 1 TABLET EVERY MORNING    Christi Glendia HERO, MD  aspirin  EC 81 MG tablet Take 81 mg by mouth daily. Swallow whole.    [provider]  Blood Glucose Monitoring Suppl (ACCU-CHEK GUIDE ME) w/Device KIT 1 Piece by Does not apply route as directed. 06/03/20   Nida, Gebreselassie W, MD  Continuous Blood Gluc Receiver (FREESTYLE LIBRE 2 READER) DEVI As directed 10/14/21   Nida, Gebreselassie W, MD  Continuous Glucose Sensor (FREESTYLE LIBRE 2 SENSOR) MISC APPLY NEW SENSOR EVERY 14 (FOURTEEN) DAYS. 11/21/23   Nida, Gebreselassie W, MD  diphenhydrAMINE  (BENADRYL ) 25 MG tablet Take 25 mg by mouth daily as needed for allergies.    [provider]  donepezil  (ARICEPT ) 5 MG tablet donepezil  5 mg tablet  TAKE 1 TABLET BY MOUTH EVERYDAY AT BEDTIME    [provider]  fluticasone  (FLONASE ) 50 MCG/ACT nasal spray Place 2 sprays daily as needed into both nostrils for allergies or rhinitis.    [provider]  gabapentin  (NEURONTIN ) 600 MG tablet Take 600-1,200 mg by mouth See admin instructions. Take 1200 mg in the morning and 600 mg at night    [provider]  hydrochlorothiazide  (HYDRODIURIL ) 25 MG tablet Take 1 tablet by mouth daily. 10/05/20   [provider]  hydrochlorothiazide  (HYDRODIURIL ) 25 MG tablet TAKE 1 TABLET BY MOUTH EVERY DAY 05/03/23   Nida, Gebreselassie W, MD  HYDROcodone -acetaminophen  (NORCO/VICODIN) 5-325 MG tablet Take 1 tablet by mouth every 8 (eight) hours as needed. 04/03/23   [provider]  insulin  NPH-regular Human (NOVOLIN  70/30) (70-30) 100 UNIT/ML injection Inject 15 Units into the skin 2 (two) times daily with a meal. Inject 20 units with breakfast, 15 units with supper when premeal blood glucose readings above 90 mg per DL . 3/86/74   Nida, Gebreselassie W, MD  Insulin  Syringe-Needle U-100 (BD VEO INSULIN  SYRINGE U/F) 31G X 15/64 1 ML MISC USE TWICE A DAY  To inject insulin  06/03/20   Nida, Gebreselassie W, MD  JARDIANCE  10 MG TABS tablet TAKE 1 TABLET BY MOUTH EVERY DAY 11/19/23   Nida, Gebreselassie W,  MD  Lancets (ONETOUCH DELICA PLUS LANCET33G) MISC USE TO TEST BLOOD SUGAR TWICE DAILY. 06/03/20   Nida, Gebreselassie W, MD  metoprolol  tartrate (LOPRESSOR ) 25 MG tablet Take 25 mg by mouth 2 (two) times daily.    [provider]  omeprazole  (PRILOSEC) 20 MG capsule Take 1 capsule (20 mg total) by mouth daily. Patient taking differently: Take 20 mg by mouth daily as needed. 02/10/20   Theadore Ozell HERO, MD  [facility] ULTRA test strip USE TO TEST BLOOD SUGAR TWICE DAILY 03/28/22   Nida, Gebreselassie W, MD  predniSONE (DELTASONE) 10 MG tablet Take by mouth.     [provider]  Propylene Glycol (SYSTANE BALANCE OP) Apply 1 drop as needed to eye (irritation).    [provider]  Rivaroxaban  (XARELTO ) 15 MG TABS tablet Take 15 mg by mouth daily.     [provider]  simvastatin  (ZOCOR ) 40 MG tablet Takes 1/2 tablet daily bedtime 02/13/11 05/19/11  Christi Glendia HERO, MD    Allergies: Ace inhibitors, Allopurinol , and Metformin  and related    Review of Systems  All other systems reviewed and are negative.   Updated Vital Signs BP 138/74 (BP Location: Right Arm)   Pulse 67   Temp 98.9 F (37.2 C) (Oral)   Resp 18   Ht 6' 2 (1.88 m)   Wt 86.2 kg   SpO2 100%   BMI 24.39 kg/m   Physical Exam Vitals and nursing note reviewed.   83 year old male, resting comfortably and in no acute distress. Vital signs are normal. Oxygen saturation is 100%, which is normal. Head is normocephalic and atraumatic. PERRLA, EOMI. Lungs are clear without rales, wheezes, or rhonchi. Heart has regular rate and rhythm without murmur. Abdomen is soft, flat, nontender without masses or hepatosplenomegaly and peristalsis is normoactive. Extremities: There is 2+ pretibial and 3+ pedal edema on the right, 1+ pedal edema on the left.  There is an ulceration on the pad of the right second finger without significant drainage.  Right foot is warm compared with the left.  Capillary refill is 3 seconds bilaterally and symmetric. Skin is warm and dry without rash. Neurologic: Awake and alert, moves all extremities equally.  Sensation is intact all the way down into his feet.          (all labs ordered are listed, but only abnormal results are displayed) Labs Reviewed  BASIC METABOLIC PANEL WITH GFR - Abnormal; Notable for the following components:      Result Value   Glucose, Bld 232 (*)    Creatinine, Ser 2.20 (*)    Calcium  8.8 (*)    GFR, Estimated 29 (*)    All other components within normal limits  CBC WITH DIFFERENTIAL/PLATELET     EKG: None  Radiology: No results found.  {Document cardiac monitor, telemetry assessment procedure when appropriate:32947} Procedures   Medications Ordered in the ED - No data to display    {Click here for ABCD2, HEART and other calculators REFRESH Note before signing:1}                              Medical Decision Making Amount and/or Complexity of Data Reviewed Labs: ordered.   Ulcer the right second toe with swelling concerning for possible osteomyelitis.  I have reviewed his laboratory tests, and my interpretation is stable renal insufficiency, normal CBC.  I have ordered sedimentation rate and CRP and x-ray of the  right second toe to look for evidence of osteomyelitis.  {Document critical care time when appropriate  Document review of labs and clinical decision tools ie CHADS2VASC2, etc  Document your independent review of radiology images and any outside records  Document your discussion with family members, caretakers and with consultants  Document social determinants of health affecting pt's care  Document your decision making why or why not admission, treatments were needed:32947:::1}   Final diagnoses:  None    ED Discharge Orders     None

## 2023-12-01 NOTE — ED Triage Notes (Signed)
 Pt stated that his right ankle and foot started swelling. Pt also has wound on his 2nd toe that he is on antibiotics for. Pt is a diabetic

## 2023-12-02 ENCOUNTER — Inpatient Hospital Stay (HOSPITAL_COMMUNITY)

## 2023-12-02 DIAGNOSIS — M19071 Primary osteoarthritis, right ankle and foot: Secondary | ICD-10-CM | POA: Diagnosis not present

## 2023-12-02 DIAGNOSIS — M7989 Other specified soft tissue disorders: Secondary | ICD-10-CM | POA: Insufficient documentation

## 2023-12-02 DIAGNOSIS — I82411 Acute embolism and thrombosis of right femoral vein: Secondary | ICD-10-CM | POA: Diagnosis not present

## 2023-12-02 DIAGNOSIS — K219 Gastro-esophageal reflux disease without esophagitis: Secondary | ICD-10-CM

## 2023-12-02 DIAGNOSIS — N138 Other obstructive and reflux uropathy: Secondary | ICD-10-CM | POA: Diagnosis not present

## 2023-12-02 DIAGNOSIS — E1122 Type 2 diabetes mellitus with diabetic chronic kidney disease: Secondary | ICD-10-CM | POA: Diagnosis not present

## 2023-12-02 DIAGNOSIS — Z9862 Peripheral vascular angioplasty status: Secondary | ICD-10-CM | POA: Diagnosis not present

## 2023-12-02 DIAGNOSIS — E1165 Type 2 diabetes mellitus with hyperglycemia: Secondary | ICD-10-CM | POA: Insufficient documentation

## 2023-12-02 DIAGNOSIS — B957 Other staphylococcus as the cause of diseases classified elsewhere: Secondary | ICD-10-CM | POA: Diagnosis not present

## 2023-12-02 DIAGNOSIS — N184 Chronic kidney disease, stage 4 (severe): Secondary | ICD-10-CM | POA: Insufficient documentation

## 2023-12-02 DIAGNOSIS — L03115 Cellulitis of right lower limb: Secondary | ICD-10-CM | POA: Diagnosis not present

## 2023-12-02 DIAGNOSIS — I70235 Atherosclerosis of native arteries of right leg with ulceration of other part of foot: Secondary | ICD-10-CM | POA: Diagnosis not present

## 2023-12-02 DIAGNOSIS — M86171 Other acute osteomyelitis, right ankle and foot: Secondary | ICD-10-CM | POA: Diagnosis not present

## 2023-12-02 DIAGNOSIS — M869 Osteomyelitis, unspecified: Secondary | ICD-10-CM | POA: Diagnosis not present

## 2023-12-02 DIAGNOSIS — Z794 Long term (current) use of insulin: Secondary | ICD-10-CM | POA: Diagnosis not present

## 2023-12-02 DIAGNOSIS — E1142 Type 2 diabetes mellitus with diabetic polyneuropathy: Secondary | ICD-10-CM | POA: Diagnosis not present

## 2023-12-02 DIAGNOSIS — L97519 Non-pressure chronic ulcer of other part of right foot with unspecified severity: Secondary | ICD-10-CM | POA: Diagnosis not present

## 2023-12-02 DIAGNOSIS — I10 Essential (primary) hypertension: Secondary | ICD-10-CM

## 2023-12-02 DIAGNOSIS — I82501 Chronic embolism and thrombosis of unspecified deep veins of right lower extremity: Secondary | ICD-10-CM | POA: Diagnosis not present

## 2023-12-02 DIAGNOSIS — E1151 Type 2 diabetes mellitus with diabetic peripheral angiopathy without gangrene: Secondary | ICD-10-CM | POA: Diagnosis not present

## 2023-12-02 DIAGNOSIS — L97514 Non-pressure chronic ulcer of other part of right foot with necrosis of bone: Secondary | ICD-10-CM | POA: Diagnosis not present

## 2023-12-02 DIAGNOSIS — I70221 Atherosclerosis of native arteries of extremities with rest pain, right leg: Secondary | ICD-10-CM | POA: Diagnosis not present

## 2023-12-02 DIAGNOSIS — E7800 Pure hypercholesterolemia, unspecified: Secondary | ICD-10-CM | POA: Diagnosis not present

## 2023-12-02 DIAGNOSIS — E1169 Type 2 diabetes mellitus with other specified complication: Secondary | ICD-10-CM | POA: Diagnosis not present

## 2023-12-02 DIAGNOSIS — E11621 Type 2 diabetes mellitus with foot ulcer: Secondary | ICD-10-CM | POA: Diagnosis not present

## 2023-12-02 DIAGNOSIS — J45909 Unspecified asthma, uncomplicated: Secondary | ICD-10-CM | POA: Diagnosis not present

## 2023-12-02 DIAGNOSIS — E039 Hypothyroidism, unspecified: Secondary | ICD-10-CM | POA: Diagnosis not present

## 2023-12-02 DIAGNOSIS — I82561 Chronic embolism and thrombosis of right calf muscular vein: Secondary | ICD-10-CM | POA: Diagnosis not present

## 2023-12-02 DIAGNOSIS — I129 Hypertensive chronic kidney disease with stage 1 through stage 4 chronic kidney disease, or unspecified chronic kidney disease: Secondary | ICD-10-CM | POA: Diagnosis not present

## 2023-12-02 DIAGNOSIS — L98499 Non-pressure chronic ulcer of skin of other sites with unspecified severity: Secondary | ICD-10-CM | POA: Diagnosis not present

## 2023-12-02 DIAGNOSIS — B952 Enterococcus as the cause of diseases classified elsewhere: Secondary | ICD-10-CM | POA: Diagnosis not present

## 2023-12-02 DIAGNOSIS — M109 Gout, unspecified: Secondary | ICD-10-CM | POA: Diagnosis not present

## 2023-12-02 DIAGNOSIS — S91104A Unspecified open wound of right lesser toe(s) without damage to nail, initial encounter: Secondary | ICD-10-CM | POA: Diagnosis not present

## 2023-12-02 DIAGNOSIS — R600 Localized edema: Secondary | ICD-10-CM | POA: Diagnosis not present

## 2023-12-02 LAB — COMPREHENSIVE METABOLIC PANEL WITH GFR
ALT: 11 U/L (ref 0–44)
AST: 12 U/L — ABNORMAL LOW (ref 15–41)
Albumin: 3.1 g/dL — ABNORMAL LOW (ref 3.5–5.0)
Alkaline Phosphatase: 61 U/L (ref 38–126)
Anion gap: 8 (ref 5–15)
BUN: 21 mg/dL (ref 8–23)
CO2: 28 mmol/L (ref 22–32)
Calcium: 8.5 mg/dL — ABNORMAL LOW (ref 8.9–10.3)
Chloride: 105 mmol/L (ref 98–111)
Creatinine, Ser: 2.07 mg/dL — ABNORMAL HIGH (ref 0.61–1.24)
GFR, Estimated: 31 mL/min — ABNORMAL LOW (ref >60)
Glucose, Bld: 238 mg/dL — ABNORMAL HIGH (ref 70–99)
Potassium: 4 mmol/L (ref 3.5–5.1)
Sodium: 141 mmol/L (ref 135–145)
Total Bilirubin: 0.6 mg/dL (ref 0.0–1.2)
Total Protein: 6.5 g/dL (ref 6.5–8.1)

## 2023-12-02 LAB — CBC
HCT: 38.7 % — ABNORMAL LOW (ref 39.0–52.0)
Hemoglobin: 13 g/dL (ref 13.0–17.0)
MCH: 30.9 pg (ref 26.0–34.0)
MCHC: 33.6 g/dL (ref 30.0–36.0)
MCV: 91.9 fL (ref 80.0–100.0)
Platelets: 181 K/uL (ref 150–400)
RBC: 4.21 MIL/uL — ABNORMAL LOW (ref 4.22–5.81)
RDW: 12.4 % (ref 11.5–15.5)
WBC: 7 K/uL (ref 4.0–10.5)
nRBC: 0 % (ref 0.0–0.2)

## 2023-12-02 LAB — GLUCOSE, CAPILLARY
Glucose-Capillary: 144 mg/dL — ABNORMAL HIGH (ref 70–99)
Glucose-Capillary: 149 mg/dL — ABNORMAL HIGH (ref 70–99)
Glucose-Capillary: 118 mg/dL — ABNORMAL HIGH (ref 70–99)
Glucose-Capillary: 139 mg/dL — ABNORMAL HIGH (ref 70–99)
Glucose-Capillary: 194 mg/dL — ABNORMAL HIGH (ref 70–99)

## 2023-12-02 LAB — MAGNESIUM: Magnesium: 2.3 mg/dL (ref 1.7–2.4)

## 2023-12-02 LAB — PHOSPHORUS: Phosphorus: 2.5 mg/dL (ref 2.5–4.6)

## 2023-12-02 LAB — HEMOGLOBIN A1C
Hgb A1c MFr Bld: 7.2 % — ABNORMAL HIGH (ref 4.8–5.6)
Mean Plasma Glucose: 159.94 mg/dL

## 2023-12-02 LAB — SEDIMENTATION RATE: Sed Rate: 67 mm/h — ABNORMAL HIGH (ref 0–20)

## 2023-12-02 LAB — C-REACTIVE PROTEIN: CRP: 2 mg/dL — ABNORMAL HIGH (ref <1.0)

## 2023-12-02 MED ORDER — ONDANSETRON HCL 4 MG/2ML IJ SOLN: 4.0000 mg | Freq: Four times a day (QID) | INTRAMUSCULAR | Status: DC | PRN

## 2023-12-02 MED ORDER — ONDANSETRON HCL 4 MG PO TABS: 4.0000 mg | ORAL_TABLET | Freq: Four times a day (QID) | ORAL | Status: DC | PRN

## 2023-12-02 MED ORDER — PANTOPRAZOLE SODIUM 40 MG PO TBEC
40.0000 mg | DELAYED_RELEASE_TABLET | Freq: Every day | ORAL | Status: DC
  Administered 2023-12-02 – 2023-12-06 (×5): 40 mg via ORAL
  Filled 2023-12-02 (×5): qty 1

## 2023-12-02 MED ORDER — SODIUM CHLORIDE 0.9 % IV SOLN
2.0000 g | Freq: Once | INTRAVENOUS | Status: AC
  Administered 2023-12-02: 2 g via INTRAVENOUS
  Filled 2023-12-02: qty 20

## 2023-12-02 MED ORDER — ENOXAPARIN SODIUM 40 MG/0.4ML IJ SOSY
40.0000 mg | PREFILLED_SYRINGE | INTRAMUSCULAR | Status: DC
  Administered 2023-12-02 – 2023-12-03 (×2): 40 mg via SUBCUTANEOUS
  Filled 2023-12-02 (×2): qty 0.4

## 2023-12-02 MED ORDER — VANCOMYCIN HCL 1250 MG/250ML IV SOLN
1250.0000 mg | INTRAVENOUS | Status: DC
  Administered 2023-12-02: 1250 mg via INTRAVENOUS
  Filled 2023-12-02: qty 250

## 2023-12-02 MED ORDER — ACETAMINOPHEN 325 MG PO TABS: 650.0000 mg | ORAL_TABLET | Freq: Four times a day (QID) | ORAL | Status: DC | PRN

## 2023-12-02 MED ORDER — METOPROLOL TARTRATE 25 MG PO TABS
25.0000 mg | ORAL_TABLET | Freq: Two times a day (BID) | ORAL | Status: DC
  Administered 2023-12-02 – 2023-12-06 (×9): 25 mg via ORAL
  Filled 2023-12-02 (×9): qty 1

## 2023-12-02 MED ORDER — AMLODIPINE BESYLATE 10 MG PO TABS
10.0000 mg | ORAL_TABLET | Freq: Every morning | ORAL | Status: DC
  Administered 2023-12-02 – 2023-12-06 (×5): 10 mg via ORAL
  Filled 2023-12-02: qty 1
  Filled 2023-12-02 (×3): qty 2
  Filled 2023-12-02: qty 1

## 2023-12-02 MED ORDER — ACETAMINOPHEN 650 MG RE SUPP: 650.0000 mg | Freq: Four times a day (QID) | RECTAL | Status: DC | PRN

## 2023-12-02 MED ORDER — VANCOMYCIN HCL IN DEXTROSE 1-5 GM/200ML-% IV SOLN
1000.0000 mg | Freq: Once | INTRAVENOUS | Status: AC
  Administered 2023-12-02: 1000 mg via INTRAVENOUS
  Filled 2023-12-02: qty 200

## 2023-12-02 MED ORDER — INSULIN ASPART 100 UNIT/ML IJ SOLN
0.0000 [IU] | Freq: Three times a day (TID) | INTRAMUSCULAR | Status: DC
  Administered 2023-12-02 (×2): 1 [IU] via SUBCUTANEOUS
  Administered 2023-12-04: 2 [IU] via SUBCUTANEOUS
  Administered 2023-12-06: 1 [IU] via SUBCUTANEOUS

## 2023-12-02 MED ORDER — SODIUM CHLORIDE 0.9 % IV SOLN
2.0000 g | INTRAVENOUS | Status: DC
  Administered 2023-12-03 – 2023-12-06 (×4): 2 g via INTRAVENOUS
  Filled 2023-12-02 (×4): qty 20

## 2023-12-02 NOTE — Progress Notes (Signed)
 Pharmacy Antibiotic Note  Hector Rose is a 83 y.o. male admitted on 12/01/2023 with RLE swelling/osteomyelitis.  Pharmacy has been consulted for Vancomycin  dosing.  Vancomycin  1 g IV given in ED at  0230  Plan: Vancomycin  1250 mg IV q48h  Height: 6' 2 (188 cm) Weight: 87.3 kg (192 lb 7.4 oz) IBW/kg (Calculated) : 82.2  Temp (24hrs), Avg:98.5 F (36.9 C), Min:98 F (36.7 C), Max:98.9 F (37.2 C)  Recent Labs  Lab 12/01/23 2225 12/02/23 0529  WBC 6.8 7.0  CREATININE 2.20* 2.07*    Estimated Creatinine Clearance: 32 mL/min (A) (by C-G formula based on SCr of 2.07 mg/dL (H)).    Allergies  Allergen Reactions   Ace Inhibitors Swelling    REACTION: angio edema (swelling of lips)   Allopurinol     Metformin  And Related     Itching in higher doses     [name] 12/02/2023 6:20 AM

## 2023-12-02 NOTE — Hospital Course (Signed)
 Hector Rose is a 83 y.o. male with medical history significant of T2DM, hypertension, gout, GERD, DVT on Xarelto  who presents to the emergency department due to 1 week onset of swelling of right foot and right second toe wound.  He states that he started noticing difficulty in being able to put on his right shoe and that his shoe has been rubbing against the second toe within the same timeframe.  He went to an urgent care about 2 days ago and was prescribed with cephalexin, he returned to the urgent care today due to no improvement in leg swelling and toe wound and his dose was increased to 500 mg twice daily (he has not started to take this), but decided to go to the ED for further evaluation.  He denies fever, chest pain, shortness of breath, chills, pain.   ED Course:  Hemodynamically stable.  Workup in ED showed normal CBC and BMP except for blood glucose of 232 and creatinine of 2.20 (baseline creatinine about 2.0-2.1).  Sed rate 67. Right second toe x-ray showed bone destruction at the tip of the right second toe distal phalanx compatible with osteomyelitis. Patient was treated with IV ceftriaxone  and vancomycin . Orthopedic surgeon on-call Dr. Sharl was consulted and there was no indication for any emergent orthopedic intervention and if amputation is necessary, patient can be referred to Dr. Harden as an outpatient per EDP.    Osteomyelitis of second toe of right foot Right second toe x-ray was suggestive of osteomyelitis Patient was started on IV ceftriaxone  and vancomycin , we shall continue with same at this time Orthopedic on-call states the patient can be admitted here and that if patient should require amputation, he can be referred to Dr. Harden as an outpatient. Orthopedic surgeon here at [facility] will be consulted and we shall await further recommendation Consider placing patient on n.p.o. at midnight   Type 2 diabetes mellitus with hyperglycemia Hemoglobin A1c about 3 months ago was  7.8 Continue ISS and hypoglycemia protocol   CKD stage IV Creatinine of 2.20 (baseline creatinine about 2.0-2.1) Renally adjust medications, avoid nephrotoxic agents/dehydration/hypotension   Lower extremity DVT Xarelto  will be temporarily held at this time in anticipation for possible surgical intervention of patient's right second toe osteomyelitis   RLE swelling This may be due to osteomyelitis in the foot Right lower extremity ultrasound in the morning to rule out DVT   Essential hypertension Continue amlodipine , Lopressor    GERD Continue Protonix 

## 2023-12-02 NOTE — Progress Notes (Signed)
   12/02/23 1239  TOC Brief Assessment  Insurance and Status Reviewed  Patient has primary care physician Yes  Home environment has been reviewed Home with spouse  Prior level of function: Independent  Prior/Current Home Services No current home services  Social Drivers of Health Review SDOH reviewed no interventions necessary  Readmission risk has been reviewed Yes  Transition of care needs no transition of care needs at this time   Transition of Care Department [facility]) has reviewed patient and no TOC needs have been identified at this time. We will continue to monitor patient advancement through interdisciplinary progression rounds. If new patient transition needs arise, please place a TOC consult.

## 2023-12-02 NOTE — Progress Notes (Signed)
 PROGRESS NOTE    Patient: Hector Rose                            PCP: Marvine Rush, MD                    DOB: 1940-12-23            DOA: 12/01/2023 FMW:[id number]             DOS: 12/02/2023, 10:52 AM   LOS: 0 days   Date of Service: The patient was seen and examined on 12/02/2023  Subjective:   The patient was seen and examined this morning. Hemodynamically stable. No issues overnight .  Brief Narrative:    Aseel Uhde is a 83 y.o. male with medical history significant of T2DM, hypertension, gout, GERD, DVT on Xarelto  who presents to the emergency department due to 1 week onset of swelling of right foot and right second toe wound.  He states that he started noticing difficulty in being able to put on his right shoe and that his shoe has been rubbing against the second toe within the same timeframe.  He went to an urgent care about 2 days ago and was prescribed with cephalexin, he returned to the urgent care today due to no improvement in leg swelling and toe wound and his dose was increased to 500 mg twice daily (he has not started to take this), but decided to go to the ED for further evaluation.  He denies fever, chest pain, shortness of breath, chills, pain.   ED Course:  Hemodynamically stable.  Workup in ED showed normal CBC and BMP except for blood glucose of 232 and creatinine of 2.20 (baseline creatinine about 2.0-2.1).  Sed rate 67. Right second toe x-ray showed bone destruction at the tip of the right second toe distal phalanx compatible with osteomyelitis. Patient was treated with IV ceftriaxone  and vancomycin . Orthopedic surgeon on-call Dr. Sharl was consulted and there was no indication for any emergent orthopedic intervention and if amputation is necessary, patient can be referred to Dr. Harden as an outpatient per EDP.    Assessment & Plan:   Principal Problem:   Osteomyelitis of second toe of right foot (HCC) Active Problems:   Essential hypertension   GERD  (gastroesophageal reflux disease)   Type 2 diabetes mellitus with hyperglycemia (HCC)   CKD (chronic kidney disease), stage IV (HCC)   Swelling of right lower extremity    Osteomyelitis of second toe of right foot Right second toe x-ray was suggestive of osteomyelitis -Obtain MRI of the right foot -Continue current antibiotics of Rocephin  and vancomycin  -Will reach out to podiatrist-her surgery team -  if patient should require amputation, he can be referred to Dr. Harden as an outpatient.     Type 2 diabetes mellitus with hyperglycemia Hemoglobin A1c about 3 months ago was 7.8 Continue ISS and hypoglycemia protocol   CKD stage IV Creatinine of 2.20 (baseline creatinine about 2.0-2.1) Renally adjust medications, avoid nephrotoxic agents/dehydration/hypotension   Lower extremity DVT Xarelto  will be temporarily on HOLD -at this time in anticipation for possible surgical intervention of patient's right second toe osteomyelitis   RLE swelling This may be due to osteomyelitis in the foot Right lower extremity ultrasound in the morning to rule out DVT   Essential hypertension Continue amlodipine , Lopressor    GERD Continue Protonix     ----------------------------------------------------------------------------------------------------------------------------------------------- Nutritional status:  The patient's BMI is: Body mass  index is 24.71 kg/m. I agree with the assessment and plan as outlined  Nutrition Status:      Skin Assessment: Right lower extremity erythema edema, with a second toe necrotic wound:   --------------------------------------------------------------------------------------------------------------------------------  DVT prophylaxis:  SCDs Start: 12/02/23 0507   Code Status:   Code Status: Full Code  Family Communication: No family member present at bedside-  -Advance care planning has been discussed.   Admission status:   Status is:  Inpatient Needing IV antibiotics, imaging MRI of the foot, surgical evaluation   Disposition: From  - home             Planning for discharge in 2 days   Procedures:   No admission procedures for hospital encounter.   Antimicrobials:  Anti-infectives (From admission, onward)    Start     Dose/Rate Route Frequency Ordered Stop   12/03/23 0030  cefTRIAXone  (ROCEPHIN ) 2 g in sodium chloride  0.9 % 100 mL IVPB        2 g 200 mL/hr over 30 Minutes Intravenous Every 24 hours 12/02/23 0438     12/02/23 1000  vancomycin  (VANCOREADY) IVPB 1250 mg/250 mL        1,250 mg 166.7 mL/hr over 90 Minutes Intravenous Every 48 hours 12/02/23 0625     12/02/23 0215  cefTRIAXone  (ROCEPHIN ) 2 g in sodium chloride  0.9 % 100 mL IVPB        2 g 200 mL/hr over 30 Minutes Intravenous  Once 12/02/23 0209 12/02/23 0259   12/02/23 0215  vancomycin  (VANCOCIN ) IVPB 1000 mg/200 mL premix        1,000 mg 200 mL/hr over 60 Minutes Intravenous  Once 12/02/23 0209 12/02/23 0329        Medication:   amLODipine   10 mg Oral q morning   insulin  aspart  0-9 Units Subcutaneous TID WC   metoprolol  tartrate  25 mg Oral BID   pantoprazole   40 mg Oral Daily    acetaminophen  **OR** acetaminophen , ondansetron  **OR** ondansetron  (ZOFRAN ) IV   Objective:   Vitals:   12/02/23 0130 12/02/23 0145 12/02/23 0200 12/02/23 0338  BP: (!) 164/73 (!) 159/74 (!) 161/77 (!) 155/96  Pulse: (!) 59 (!) 56 (!) 56 65  Resp:   17 19  Temp:    98 F (36.7 C)  TempSrc:    Oral  SpO2: 98% 96% 100% 100%  Weight:    87.3 kg  Height:        Intake/Output Summary (Last 24 hours) at 12/02/2023 1052 Last data filed at 12/02/2023 0426 Gross per 24 hour  Intake 526.96 ml  Output --  Net 526.96 ml   Filed Weights   12/01/23 2149 12/02/23 0338  Weight: 86.2 kg 87.3 kg     Physical examination:   General:  AAO x 3,  cooperative, no distress;   HEENT:  Normocephalic, PERRL, otherwise with in Normal limits   Neuro:  CNII-XII  intact. , normal motor and sensation, reflexes intact   Lungs:   Clear to auscultation BL, Respirations unlabored,  No wheezes / crackles  Cardio:    S1/S2, RRR, No murmure, No Rubs or Gallops   Abdomen:  Soft, non-tender, bowel sounds active all four quadrants, no guarding or peritoneal signs.  Muscular  skeletal:  Limited exam -global generalized weaknesses - in bed, able to move all 4 extremities,   2+ pulses,  symmetric, No pitting edema  Skin:  Dry, warm to touch, right lower extremity foot second erythema edema, second  toe tip necrosis  Wounds: Right lower extremity with erythema, edema, second toe necrotic type    Media Information  Document Information  Photos    12/01/2023 23:38  Attached To:  Hospital Encounter on 12/01/23  Source Information    ------------------------------------------------------------------------------------------------------------------------------------------    LABs:     Latest Ref Rng & Units 12/02/2023    5:29 AM 12/01/2023   10:25 PM 02/10/2020    2:14 PM  CBC  WBC 4.0 - 10.5 K/uL 7.0  6.8  6.6   Hemoglobin 13.0 - 17.0 g/dL 86.9  86.0  85.3   Hematocrit 39.0 - 52.0 % 38.7  41.1  43.2   Platelets 150 - 400 K/uL 181  198  152       Latest Ref Rng & Units 12/02/2023    5:29 AM 12/01/2023   10:25 PM 04/17/2023    8:54 AM  CMP  Glucose 70 - 99 mg/dL 761  767  887   BUN 8 - 23 mg/dL 21  22  26    Creatinine 0.61 - 1.24 mg/dL 7.92  7.79  8.04   Sodium 135 - 145 mmol/L 141  141  143   Potassium 3.5 - 5.1 mmol/L 4.0  4.0  3.9   Chloride 98 - 111 mmol/L 105  104  103   CO2 22 - 32 mmol/L 28  27  25    Calcium  8.9 - 10.3 mg/dL 8.5  8.8  9.5   Total Protein 6.5 - 8.1 g/dL 6.5   6.6   Total Bilirubin 0.0 - 1.2 mg/dL 0.6   0.5   Alkaline Phos 38 - 126 U/L 61   94   AST 15 - 41 U/L 12   20   ALT 0 - 44 U/L 11   15        Micro Results No results found for this or any previous visit (from the past 240 hours).  Radiology Reports DG Toe  2nd Right Result Date: 12/02/2023 CLINICAL DATA:  Swelling, question osteomyelitis EXAM: RIGHT SECOND TOE COMPARISON:  None Available. FINDINGS: There is bone destruction at the tip of the right 2nd toe distal phalanx compatible with osteomyelitis. Overlying soft tissue swelling. No subluxation or dislocation. No fracture. IMPRESSION: Bone destruction at the tip of the right 2nd toe distal phalanx compatible with osteomyelitis. Electronically Signed   By: Franky Crease M.D.   On: 12/02/2023 00:04    SIGNED: Adriana DELENA Grams, MD, FHM. FAAFP. Jolynn Pack - [facility] Time spent - 55 min.  In seeing, evaluating and examining the patient. Reviewing medical records, labs, drawn plan of care. [facility],  Pager (please use [email or web address] to page/ text) Please use Epic Secure Chat for non-urgent communication (7AM-7PM)  If 7PM-7AM, please contact night-coverage [email or web address], 12/02/2023, 10:52 AM

## 2023-12-02 NOTE — H&P (Signed)
 History and Physical    Patient: Hector Rose FMW:[id number] DOB: 03-14-1940 DOA: 12/01/2023 DOS: the patient was seen and examined on 12/02/2023 PCP: Marvine Rush, MD  Patient coming from: Home  Chief Complaint:  Chief Complaint  Patient presents with   Leg Swelling   HPI: Hector Rose is a 83 y.o. male with medical history significant of T2DM, hypertension, gout, GERD, DVT on Xarelto  who presents to the emergency department due to 1 week onset of swelling of right foot and right second toe wound.  He states that he started noticing difficulty in being able to put on his right shoe and that his shoe has been rubbing against the second toe within the same timeframe.  He went to an urgent care about 2 days ago and was prescribed with cephalexin to be milligrams twice daily, he returned to the urgent care today due to no improvement in leg swelling and toe wound and his dose was increased to 500 mg twice daily (he has not started to take this), but decided to go to the ED for further evaluation.  He denies fever, chest pain, shortness of breath, chills, pain.  ED Course:  In the emergency department, he was hemodynamically stable.  Workup in ED showed normal CBC and BMP except for blood glucose of 232 and creatinine of 2.20 (baseline creatinine about 2.0-2.1).  Sed rate 67. Right second toe x-ray showed bone destruction at the tip of the right second toe distal phalanx compatible with osteomyelitis. Patient was treated with IV ceftriaxone  and vancomycin . Orthopedic surgeon on-call Dr. Sharl was consulted and there was no indication for any emergent orthopedic intervention and if amputation is necessary, patient can be referred to Dr. Harden as an outpatient per EDP.  Review of Systems: Review of systems as noted in the HPI. All other systems reviewed and are negative.   Past Medical History:  Diagnosis Date   Asthma 04-11-11   AS CHILD ONLY   BPH (benign prostatic hypertrophy) with urinary  obstruction    Carpal tunnel syndrome    patient denies at 05/14/17 preop visit    Difficult intubation 04-11-11   08-11-97 some issues with intubation/record with chart   DJD (degenerative joint disease)    DM (diabetes mellitus) (HCC) 04-11-11   tyep II    DVT femoral (deep venous thrombosis) with thrombophlebitis (HCC) 04-11-11   ?'09-tx. on Xarelton x 3-4 years    GERD (gastroesophageal reflux disease) 04-11-11   mild, no med in 1 month   Gout 04-11-11    ankle 2 yrs ago   Hemorrhoids    Hypercholesteremia    Hypertension    Internal hemorrhoids    Keloid 04-11-11   multiple-arms,back, chest   Pulmonary nodule 04-11-11   PT. NOT AWARE -denies problems with breathing   Renal calculus    Thyroid  disease    hypothyroid   Past Surgical History:  Procedure Laterality Date   CATARACT EXTRACTION W/PHACO Left 10/28/2021   Procedure: CATARACT EXTRACTION PHACO AND INTRAOCULAR LENS PLACEMENT (IOC);  Surgeon: Harrie Agent, MD;  Location: [facility];  Service: Ophthalmology;  Laterality: Left;  CDE: 6.61   cataract surgery  12/2013   COLONOSCOPY WITH PROPOFOL  N/A 02/05/2017   Procedure: COLONOSCOPY WITH PROPOFOL ;  Surgeon: Aneita Gwendlyn DASEN, MD;  Location: [facility];  Service: Endoscopy;  Laterality: N/A;   COSMETIC SURGERY  2000   Keloid injections   CYSTOSCOPY WITH BIOPSY  04/14/2011   Procedure: CYSTOSCOPY WITH BIOPSY;  Surgeon: Noretta Ferrara, MD;  Location: [facility];  Service: Urology;  Laterality: N/A;  Bladder Biopsies    CYSTOSCOPY/RETROGRADE/URETEROSCOPY  04/14/2011   Procedure: CYSTOSCOPY/RETROGRADE/URETEROSCOPY;  Surgeon: Noretta Ferrara, MD;  Location: [facility];  Service: Urology;  Laterality: Bilateral;  (Bil) RPG    LAPAROSCOPIC NEPHRECTOMY Right 05/21/2017   Procedure: LAPAROSCOPIC RADICAL NEPHRECTOMY;  Surgeon: Ferrara Glance, MD;  Location: [facility];  Service: Urology;  Laterality: Right;   ortho surgery on fingers  2005 & 2008   Dr. Jearld finger release   PROSTATECTOMY  1999   Dr.  Nicholaus    Social History:  reports that he has never smoked. He has never used smokeless tobacco. He reports that he does not drink alcohol and does not use drugs.   Allergies  Allergen Reactions   Ace Inhibitors Swelling    REACTION: angio edema (swelling of lips)   Allopurinol     Metformin  And Related     Itching in higher doses     History reviewed. No pertinent family history.    Prior to Admission medications   Medication Sig Start Date End Date Taking? Authorizing Provider  allopurinol  (ZYLOPRIM ) 300 MG tablet TAKE 1 TABLET BY MOUTH ONCE DAILY Patient taking differently: daily as needed. TAKE 1 TABLET BY MOUTH ONCE DAILY AS NEEDED 06/06/13   Christi Glendia HERO, MD  amLODipine  (NORVASC ) 10 MG tablet TAKE 1 TABLET EVERY MORNING    Christi Glendia HERO, MD  aspirin  EC 81 MG tablet Take 81 mg by mouth daily. Swallow whole.    [provider]  Blood Glucose Monitoring Suppl (ACCU-CHEK GUIDE ME) w/Device KIT 1 Piece by Does not apply route as directed. 06/03/20   Nida, Gebreselassie W, MD  Continuous Blood Gluc Receiver (FREESTYLE LIBRE 2 READER) DEVI As directed 10/14/21   Nida, Gebreselassie W, MD  Continuous Glucose Sensor (FREESTYLE LIBRE 2 SENSOR) MISC APPLY NEW SENSOR EVERY 14 (FOURTEEN) DAYS. 11/21/23   Nida, Gebreselassie W, MD  diphenhydrAMINE  (BENADRYL ) 25 MG tablet Take 25 mg by mouth daily as needed for allergies.    [provider]  donepezil  (ARICEPT ) 5 MG tablet donepezil  5 mg tablet  TAKE 1 TABLET BY MOUTH EVERYDAY AT BEDTIME    [provider]  fluticasone  (FLONASE ) 50 MCG/ACT nasal spray Place 2 sprays daily as needed into both nostrils for allergies or rhinitis.    [provider]  gabapentin  (NEURONTIN ) 600 MG tablet Take 600-1,200 mg by mouth See admin instructions. Take 1200 mg in the morning and 600 mg at night    [provider]  hydrochlorothiazide  (HYDRODIURIL ) 25 MG tablet Take 1 tablet by mouth daily. 10/05/20   [provider]  hydrochlorothiazide  (HYDRODIURIL ) 25 MG tablet TAKE 1 TABLET BY MOUTH EVERY DAY 05/03/23   Nida, Gebreselassie W, MD  HYDROcodone -acetaminophen  (NORCO/VICODIN) 5-325 MG tablet Take 1 tablet by mouth every 8 (eight) hours as needed. 04/03/23   [provider]  insulin  NPH-regular Human (NOVOLIN  70/30) (70-30) 100 UNIT/ML injection Inject 15 Units into the skin 2 (two) times daily with a meal. Inject 20 units with breakfast, 15 units with supper when premeal blood glucose readings above 90 mg per DL . 3/86/74   Nida, Gebreselassie W, MD  Insulin  Syringe-Needle U-100 (BD VEO INSULIN  SYRINGE U/F) 31G X 15/64 1 ML MISC USE TWICE A DAY  To inject insulin  06/03/20   Nida, Gebreselassie W, MD  JARDIANCE  10 MG TABS tablet TAKE 1 TABLET BY MOUTH EVERY DAY 11/19/23   Nida, Gebreselassie W, MD  Lancets [employer]  DELICA PLUS LANCET33G) MISC USE TO TEST BLOOD SUGAR TWICE DAILY. 06/03/20   Nida, Gebreselassie W, MD  metoprolol  tartrate (LOPRESSOR ) 25 MG tablet Take 25 mg by mouth 2 (two) times daily.    [provider]  omeprazole  (PRILOSEC) 20 MG capsule Take 1 capsule (20 mg total) by mouth daily. Patient taking differently: Take 20 mg by mouth daily as needed. 02/10/20   Theadore Ozell HERO, MD  [facility] ULTRA test strip USE TO TEST BLOOD SUGAR TWICE DAILY 03/28/22   Nida, Gebreselassie W, MD  predniSONE (DELTASONE) 10 MG tablet Take by mouth.    [provider]  Propylene Glycol (SYSTANE BALANCE OP) Apply 1 drop as needed to eye (irritation).    [provider]  Rivaroxaban  (XARELTO ) 15 MG TABS tablet Take 15 mg by mouth daily.     [provider]  simvastatin  (ZOCOR ) 40 MG tablet Takes 1/2 tablet daily bedtime 02/13/11 05/19/11  Christi Glendia HERO, MD    Physical Exam: BP (!) 155/96 (BP Location: Left Arm)   Pulse 65   Temp 98 F (36.7 C) (Oral)   Resp 19   Ht 6' 2 (1.88 m)   Wt 87.3 kg   SpO2 100%   BMI 24.71 kg/m   General: 83 y.o. year-old male well  developed well nourished in no acute distress.  Alert and oriented x3. HEENT: NCAT, EOMI Neck: Supple, trachea medial Cardiovascular: Regular rate and rhythm with no rubs or gallops.  No thyromegaly or JVD noted.  No lower extremity edema. 2/4 pulses in all 4 extremities. Respiratory: Clear to auscultation with no wheezes or rales. Good inspiratory effort. Abdomen: Soft, nontender nondistended with normal bowel sounds x4 quadrants. Muskuloskeletal: +2 edema in RLE, +1 edema in LLE.  Noted ulcer of the right second toe without drainage.  Right foot warm to touch compared to left. Neuro: CN II-XII intact, strength 5/5 x 4, sensation, reflexes intact Skin: No ulcerative lesions noted or rashes Psychiatry: Judgement and insight appear normal. Mood is appropriate for condition and setting          Labs on Admission:  Basic Metabolic Panel: Recent Labs  Lab 12/01/23 2225  NA 141  K 4.0  CL 104  CO2 27  GLUCOSE 232*  BUN 22  CREATININE 2.20*  CALCIUM  8.8*   Liver Function Tests: No results for input(s): AST, ALT, ALKPHOS, BILITOT, PROT, ALBUMIN in the last 168 hours. No results for input(s): LIPASE, AMYLASE in the last 168 hours. No results for input(s): AMMONIA in the last 168 hours. CBC: Recent Labs  Lab 12/01/23 2225  WBC 6.8  NEUTROABS 4.7  HGB 13.9  HCT 41.1  MCV 92.2  PLT 198   Cardiac Enzymes: No results for input(s): CKTOTAL, CKMB, CKMBINDEX, TROPONINI in the last 168 hours.  BNP (last 3 results) No results for input(s): BNP in the last 8760 hours.  ProBNP (last 3 results) No results for input(s): PROBNP in the last 8760 hours.  CBG: Recent Labs  Lab 12/02/23 0347  GLUCAP 144*    Radiological Exams on Admission: DG Toe 2nd Right Result Date: 12/02/2023 CLINICAL DATA:  Swelling, question osteomyelitis EXAM: RIGHT SECOND TOE COMPARISON:  None Available. FINDINGS: There is bone destruction at the tip of the right 2nd toe distal  phalanx compatible with osteomyelitis. Overlying soft tissue swelling. No subluxation or dislocation. No fracture. IMPRESSION: Bone destruction at the tip of the right 2nd toe distal phalanx compatible with osteomyelitis. Electronically Signed   By: Franky  Dover M.D.   On: 12/02/2023 00:04    EKG: I independently viewed the EKG done and my findings are as followed: EKG was not done in the ED  Assessment/Plan Present on Admission:  Osteomyelitis of second toe of right foot (HCC)  Essential hypertension  GERD (gastroesophageal reflux disease)  Principal Problem:   Osteomyelitis of second toe of right foot (HCC) Active Problems:   Essential hypertension   GERD (gastroesophageal reflux disease)   Type 2 diabetes mellitus with hyperglycemia (HCC)   CKD (chronic kidney disease), stage IV (HCC)   Swelling of right lower extremity  Osteomyelitis of second toe of right foot Right second toe x-ray was suggestive of osteomyelitis Patient was started on IV ceftriaxone  and vancomycin , we shall continue with same at this time Orthopedic on-call states the patient can be admitted here and that if patient should require amputation, he can be referred to Dr. Harden as an outpatient. Orthopedic surgeon here at [facility] will be consulted and we shall await further recommendation Consider placing patient on n.p.o. at midnight  Type 2 diabetes mellitus with hyperglycemia Hemoglobin A1c about 3 months ago was 7.8 Continue ISS and hypoglycemia protocol  CKD stage IV Creatinine of 2.20 (baseline creatinine about 2.0-2.1) Renally adjust medications, avoid nephrotoxic agents/dehydration/hypotension  Lower extremity DVT Xarelto  will be temporarily held at this time in anticipation for possible surgical intervention of patient's right second toe osteomyelitis  RLE swelling This may be due to osteomyelitis in the foot Right lower extremity ultrasound in the morning to rule out DVT  Essential  hypertension Continue amlodipine , Lopressor   GERD Continue Protonix   DVT prophylaxis: SCDs (consider restarting home Xarelto  if no indication for surgical intervention)  Code Status: Full code  Family Communication: None at bedside  Consults: Orthopedic surgery  Severity of Illness: The appropriate patient status for this patient is INPATIENT. Inpatient status is judged to be reasonable and necessary in order to provide the required intensity of service to ensure the patient's safety. The patient's presenting symptoms, physical exam findings, and initial radiographic and laboratory data in the context of their chronic comorbidities is felt to place them at high risk for further clinical deterioration. Furthermore, it is not anticipated that the patient will be medically stable for discharge from the hospital within 2 midnights of admission.   * I certify that at the point of admission it is my clinical judgment that the patient will require inpatient hospital care spanning beyond 2 midnights from the point of admission due to high intensity of service, high risk for further deterioration and high frequency of surveillance required.*  Author: Anjali Manzella, DO 12/02/2023 5:21 AM  For on call review [email or web address].

## 2023-12-02 NOTE — Plan of Care (Signed)

## 2023-12-03 ENCOUNTER — Inpatient Hospital Stay (HOSPITAL_COMMUNITY)

## 2023-12-03 DIAGNOSIS — M869 Osteomyelitis, unspecified: Secondary | ICD-10-CM | POA: Diagnosis not present

## 2023-12-03 DIAGNOSIS — I82561 Chronic embolism and thrombosis of right calf muscular vein: Secondary | ICD-10-CM

## 2023-12-03 DIAGNOSIS — E1140 Type 2 diabetes mellitus with diabetic neuropathy, unspecified: Secondary | ICD-10-CM

## 2023-12-03 DIAGNOSIS — E1122 Type 2 diabetes mellitus with diabetic chronic kidney disease: Secondary | ICD-10-CM

## 2023-12-03 DIAGNOSIS — I824Z1 Acute embolism and thrombosis of unspecified deep veins of right distal lower extremity: Secondary | ICD-10-CM

## 2023-12-03 DIAGNOSIS — E11621 Type 2 diabetes mellitus with foot ulcer: Secondary | ICD-10-CM

## 2023-12-03 DIAGNOSIS — N189 Chronic kidney disease, unspecified: Secondary | ICD-10-CM

## 2023-12-03 LAB — BASIC METABOLIC PANEL WITH GFR
Anion gap: 8 (ref 5–15)
BUN: 21 mg/dL (ref 8–23)
CO2: 27 mmol/L (ref 22–32)
Calcium: 8.6 mg/dL — ABNORMAL LOW (ref 8.9–10.3)
Chloride: 106 mmol/L (ref 98–111)
Creatinine, Ser: 2.26 mg/dL — ABNORMAL HIGH (ref 0.61–1.24)
GFR, Estimated: 28 mL/min — ABNORMAL LOW (ref >60)
Glucose, Bld: 110 mg/dL — ABNORMAL HIGH (ref 70–99)
Potassium: 4 mmol/L (ref 3.5–5.1)
Sodium: 141 mmol/L (ref 135–145)

## 2023-12-03 LAB — CBC
HCT: 38.4 % — ABNORMAL LOW (ref 39.0–52.0)
Hemoglobin: 13 g/dL (ref 13.0–17.0)
MCH: 30.8 pg (ref 26.0–34.0)
MCHC: 33.9 g/dL (ref 30.0–36.0)
MCV: 91 fL (ref 80.0–100.0)
Platelets: 178 K/uL (ref 150–400)
RBC: 4.22 MIL/uL (ref 4.22–5.81)
RDW: 12.2 % (ref 11.5–15.5)
WBC: 6.5 K/uL (ref 4.0–10.5)
nRBC: 0 % (ref 0.0–0.2)

## 2023-12-03 LAB — GLUCOSE, CAPILLARY
Glucose-Capillary: 101 mg/dL — ABNORMAL HIGH (ref 70–99)
Glucose-Capillary: 120 mg/dL — ABNORMAL HIGH (ref 70–99)
Glucose-Capillary: 112 mg/dL — ABNORMAL HIGH (ref 70–99)
Glucose-Capillary: 156 mg/dL — ABNORMAL HIGH (ref 70–99)

## 2023-12-03 MED ORDER — VANCOMYCIN HCL 1750 MG/350ML IV SOLN
1750.0000 mg | INTRAVENOUS | Status: DC
  Filled 2023-12-03: qty 350

## 2023-12-03 NOTE — Progress Notes (Signed)
 Pharmacy Antibiotic Note  Hector Rose is a 83 y.o. male admitted on 12/01/2023 with RLE swelling/osteomyelitis.  Pharmacy has been consulted for Vancomycin  dosing.    Plan: Change Vancomycin  1750 mg IV Q 48 hrs. Goal AUC 400-550. Expected AUC: 484 SCr used: 2.26 Also on ceftriaxone  2gm IV q24h F/U cxs and clinical progress Monitor V/S, labs and levels as indicated   Height: 6' 2 (188 cm) Weight: 87.3 kg (192 lb 7.4 oz) IBW/kg (Calculated) : 82.2  Temp (24hrs), Avg:98.5 F (36.9 C), Min:98.3 F (36.8 C), Max:98.7 F (37.1 C)  Recent Labs  Lab 12/01/23 2225 12/02/23 0529 12/03/23 0517  WBC 6.8 7.0 6.5  CREATININE 2.20* 2.07* 2.26*    Estimated Creatinine Clearance: 29.3 mL/min (A) (by C-G formula based on SCr of 2.26 mg/dL (H)).    Allergies  Allergen Reactions   Ace Inhibitors Swelling    Angio edema (swelling of lips) Swelling around face Swelling in mouth   Allopurinol  Itching    Makes feet fill numb and tingle Pt stated he is not allergic to this 12-02-2023   Metformin  And Related Itching    Itching in higher doses   Vancomycin  9/21>> Ceftriaxone  9/21>>  No cultures  Cherlyn Boers, BS Pharm D, BCPS Clinical Pharmacist 12/03/2023 9:16 AM

## 2023-12-03 NOTE — Plan of Care (Signed)
  Problem: Education: Goal: Knowledge of General Education information will improve Description: Including pain rating scale, medication(s)/side effects and non-pharmacologic comfort measures Outcome: Progressing   Problem: Clinical Measurements: Goal: Will remain free from infection Outcome: Progressing   Problem: Nutrition: Goal: Adequate nutrition will be maintained Outcome: Progressing   

## 2023-12-03 NOTE — Plan of Care (Signed)

## 2023-12-03 NOTE — Progress Notes (Signed)
 PROGRESS NOTE    Patient: Hector Rose                            PCP: Marvine Rush, MD                    DOB: Nov 04, 1940            DOA: 12/01/2023 FMW:[id number]             DOS: 12/03/2023, 12:00 PM   LOS: 1 day   Date of Service: The patient was seen and examined on 12/03/2023  Subjective:   The patient was seen and examined this morning, stable no acute distress. No issues overnight Hemodynamically stable  Brief Narrative:    Hector Rose is a 83 y.o. male with medical history significant of T2DM, hypertension, gout, GERD, DVT on Xarelto  who presents to the emergency department due to 1 week onset of swelling of right foot and right second toe wound.  He states that he started noticing difficulty in being able to put on his right shoe and that his shoe has been rubbing against the second toe within the same timeframe.  He went to an urgent care about 2 days ago and was prescribed with cephalexin, he returned to the urgent care today due to no improvement in leg swelling and toe wound and his dose was increased to 500 mg twice daily (he has not started to take this), but decided to go to the ED for further evaluation.  He denies fever, chest pain, shortness of breath, chills, pain.   ED Course:  Hemodynamically stable.  Workup in ED showed normal CBC and BMP except for blood glucose of 232 and creatinine of 2.20 (baseline creatinine about 2.0-2.1).  Sed rate 67. Right second toe x-ray showed bone destruction at the tip of the right second toe distal phalanx compatible with osteomyelitis. Patient was treated with IV ceftriaxone  and vancomycin . Orthopedic surgeon on-call Dr. Sharl was consulted and there was no indication for any emergent orthopedic intervention and if amputation is necessary, patient can be referred to Dr. Harden as an outpatient per EDP.    Assessment & Plan:   Principal Problem:   Osteomyelitis of second toe of right foot (HCC) Active Problems:   Essential  hypertension   GERD (gastroesophageal reflux disease)   Type 2 diabetes mellitus with hyperglycemia (HCC)   CKD (chronic kidney disease), stage IV (HCC)   Swelling of right lower extremity    Osteomyelitis of second toe of right foot Right second toe x-ray was suggestive of osteomyelitis -MRI right foot: Osteomyelitis of the second toe. There is a wound at the tip of the second toe which communicates with the bone. No obvious involvement of the proximal phalanx.  -Continue current antibiotics of Rocephin  and vancomycin  -- Consulting general surgery Dr. Kallie for further evaluation recommendations   Type 2 diabetes mellitus with hyperglycemia Hemoglobin A1c about 3 months ago was 7.8 Continue ISS and hypoglycemia protocol   CKD stage IV Creatinine of 2.20 (baseline creatinine about 2.0-2.1) Renally adjust medications, avoid nephrotoxic agents/dehydration/hypotension Lab Results  Component Value Date   CREATININE 2.26 (H) 12/03/2023   CREATININE 2.07 (H) 12/02/2023   CREATININE 2.20 (H) 12/01/2023      Lower extremity DVT Xarelto  will be temporarily on HOLD -at this time in anticipation for possible surgical intervention of patient's right second toe osteomyelitis   RLE swelling This may be due to  osteomyelitis in the foot Right lower extremity ultrasound in the morning to rule out DVT   Essential hypertension Continue amlodipine , Lopressor    GERD Continue Protonix     ----------------------------------------------------------------------------------------------------------------------------------------------- Nutritional status:  The patient's BMI is: Body mass index is 24.71 kg/m. I agree with the assessment and plan as outlined  Nutrition Status:      Skin Assessment: Right lower extremity erythema edema, with a second toe necrotic wound:    --------------------------------------------------------------------------------------------------------------------------------  DVT prophylaxis:  enoxaparin  (LOVENOX ) injection 40 mg Start: 12/02/23 2200 SCDs Start: 12/02/23 1855 SCDs Start: 12/02/23 0507   Code Status:   Code Status: Full Code  Family Communication: No family member present at bedside-  -Advance care planning has been discussed.   Admission status:   Status is: Inpatient Needing IV antibiotics, imaging MRI of the foot, surgical evaluation   Disposition: From  - home             Planning for discharge in 2 days   Procedures:   No admission procedures for hospital encounter.   Antimicrobials:  Anti-infectives (From admission, onward)    Start     Dose/Rate Route Frequency Ordered Stop   12/04/23 1000  vancomycin  (VANCOREADY) IVPB 1750 mg/350 mL        1,750 mg 175 mL/hr over 120 Minutes Intravenous Every 48 hours 12/03/23 0927     12/03/23 0030  cefTRIAXone  (ROCEPHIN ) 2 g in sodium chloride  0.9 % 100 mL IVPB        2 g 200 mL/hr over 30 Minutes Intravenous Every 24 hours 12/02/23 0438     12/02/23 1000  vancomycin  (VANCOREADY) IVPB 1250 mg/250 mL  Status:  Discontinued        1,250 mg 166.7 mL/hr over 90 Minutes Intravenous Every 48 hours 12/02/23 0625 12/03/23 0927   12/02/23 0215  cefTRIAXone  (ROCEPHIN ) 2 g in sodium chloride  0.9 % 100 mL IVPB        2 g 200 mL/hr over 30 Minutes Intravenous  Once 12/02/23 0209 12/02/23 0259   12/02/23 0215  vancomycin  (VANCOCIN ) IVPB 1000 mg/200 mL premix        1,000 mg 200 mL/hr over 60 Minutes Intravenous  Once 12/02/23 0209 12/02/23 0329        Medication:   amLODipine   10 mg Oral q morning   enoxaparin  (LOVENOX ) injection  40 mg Subcutaneous Q24H   insulin  aspart  0-9 Units Subcutaneous TID WC   metoprolol  tartrate  25 mg Oral BID   pantoprazole   40 mg Oral Daily    acetaminophen  **OR** acetaminophen , ondansetron  **OR** ondansetron  (ZOFRAN )  IV   Objective:   Vitals:   12/02/23 1349 12/02/23 1930 12/02/23 2233 12/03/23 0323  BP: 134/61 136/70 136/70 117/64  Pulse: 64 71 71 65  Resp:  19  16  Temp: 98.3 F (36.8 C) 98.7 F (37.1 C)  98.6 F (37 C)  TempSrc: Oral Oral  Oral  SpO2: 98% 97%  98%  Weight:      Height:        Intake/Output Summary (Last 24 hours) at 12/03/2023 1200 Last data filed at 12/03/2023 0600 Gross per 24 hour  Intake 664.79 ml  Output --  Net 664.79 ml   Filed Weights   12/01/23 2149 12/02/23 0338  Weight: 86.2 kg 87.3 kg     Physical examination:        General:  AAO x 3,  cooperative, no distress;   HEENT:  Normocephalic, PERRL, otherwise with in Normal limits  Neuro:  CNII-XII intact. , normal motor and sensation, reflexes intact   Lungs:   Clear to auscultation BL, Respirations unlabored,  No wheezes / crackles  Cardio:    S1/S2, RRR, No murmure, No Rubs or Gallops   Abdomen:  Soft, non-tender, bowel sounds active all four quadrants, no guarding or peritoneal signs.  Muscular  skeletal:  Limited exam -global generalized weaknesses - in bed, able to move all 4 extremities,   Right lower extremity edema, with second toe necrotic wound at the tip 2+ pulses,  symmetric, right lower extremity +2 pitting edema  Skin:  Dry, warm to touch, right lower extremity edema   Wounds: Right lower extremity with erythema, edema, second toe necrotic type    Media Information  Document Information  Photos    12/01/2023 23:38  Attached To:  Hospital Encounter on 12/01/23  Source Information    ------------------------------------------------------------------------------------------------------------------------------------------    LABs:     Latest Ref Rng & Units 12/03/2023    5:17 AM 12/02/2023    5:29 AM 12/01/2023   10:25 PM  CBC  WBC 4.0 - 10.5 K/uL 6.5  7.0  6.8   Hemoglobin 13.0 - 17.0 g/dL 86.9  86.9  86.0   Hematocrit 39.0 - 52.0 % 38.4  38.7  41.1   Platelets 150  - 400 K/uL 178  181  198       Latest Ref Rng & Units 12/03/2023    5:17 AM 12/02/2023    5:29 AM 12/01/2023   10:25 PM  CMP  Glucose 70 - 99 mg/dL 889  761  767   BUN 8 - 23 mg/dL 21  21  22    Creatinine 0.61 - 1.24 mg/dL 7.73  7.92  7.79   Sodium 135 - 145 mmol/L 141  141  141   Potassium 3.5 - 5.1 mmol/L 4.0  4.0  4.0   Chloride 98 - 111 mmol/L 106  105  104   CO2 22 - 32 mmol/L 27  28  27    Calcium  8.9 - 10.3 mg/dL 8.6  8.5  8.8   Total Protein 6.5 - 8.1 g/dL  6.5    Total Bilirubin 0.0 - 1.2 mg/dL  0.6    Alkaline Phos 38 - 126 U/L  61    AST 15 - 41 U/L  12    ALT 0 - 44 U/L  11         Micro Results No results found for this or any previous visit (from the past 240 hours).  Radiology Reports US  Venous Img Lower Unilateral Right (DVT) Result Date: 12/02/2023 CLINICAL DATA:  83 year old male with right lower extremity swelling. EXAM: RIGHT LOWER EXTREMITY VENOUS DOPPLER ULTRASOUND TECHNIQUE: Gray-scale sonography with graded compression, as well as color Doppler and duplex ultrasound were performed to evaluate the right lower extremity deep venous systems from the level of the common femoral vein and including the common femoral, femoral, profunda femoral, popliteal and calf veins including the posterior tibial, peroneal and gastrocnemius veins when visible. Spectral Doppler was utilized to evaluate flow at rest and with distal augmentation maneuvers in the common femoral, femoral and popliteal veins. The contralateral common femoral vein was also evaluated for comparison. COMPARISON:  None Available. FINDINGS: RIGHT LOWER EXTREMITY Common Femoral Vein: No evidence of thrombus. Normal compressibility, respiratory phasicity and response to augmentation. Central Greater Saphenous Vein: No evidence of thrombus. Normal compressibility and flow on color Doppler imaging. Central Profunda Femoral Vein: No evidence of thrombus. Normal  compressibility and flow on color Doppler imaging.  Femoral Vein: There is nonocclusive, non expansile, slightly hyperechoic and wall adherent thrombus visualized in the mid femoral vein. There is occlusive, mildly expansile thrombus visualized in the peripheral aspect of the femoral vein. Popliteal Vein: There is nonocclusive, non expansile, slightly hyperechoic wall adherent thrombus visualized. Calf Veins: No evidence of thrombus. Normal compressibility and flow on color Doppler imaging. Venous Reflux:  None. Other Findings:  None. LEFT LOWER EXTREMITY Common Femoral Vein: No evidence of thrombus. Normal compressibility, respiratory phasicity and response to augmentation. IMPRESSION: Deep vein thrombosis limited to the right popliteal and femoral vein which appears mostly chronic however there is a short segment of acute appearing thrombus in the peripheral femoral vein. No evidence of iliocaval extension. Ester Sides, MD [facility] Radiology Electronically Signed   By: Ester Sides M.D.   On: 12/02/2023 21:47    SIGNED: Adriana DELENA Grams, MD, FHM. FAAFP. Jolynn Pack - [facility] Time spent - 55 min.  In seeing, evaluating and examining the patient. Reviewing medical records, labs, drawn plan of care. [facility],  Pager (please use [email or web address] to page/ text) Please use Epic Secure Chat for non-urgent communication (7AM-7PM)  If 7PM-7AM, please contact night-coverage [email or web address], 12/03/2023, 12:00 PM

## 2023-12-03 NOTE — Consult Note (Addendum)
 I was present with the medical student for this service. I personally verified the history of present illness, performed the physical exam, and made the plan for this encounter. I have verified the medical student's documentation and made modifications where appropriately. I have personally documented in my own words a brief history, physical, and plan below.     Right 2nd toe distal osteomyelitis and some swelling in the mid phalanx. ABI now. Will talk to patient and wife more about surgery.   Continue antibiotics. Have on schedule for Wednesday pending no changes.  Manuelita Pander, MD [facility] [address] [place], [place] [address] [phone] (office)   Reason for Consult:Osteomyelitis of second toe of right foot Referring Physician: Adriana Grams  YEP:Hector Rose is an 83 y.o. male with a PMH of T2DM, HTN, CKD (baseline Cr ~2), GERD, gout, and prior DVT on Xarelto . He presents with 1 week of right foot swelling, and right second toe wound. He states that he started noticing difficulty in being able to put on his right shoe and that his shoe has been rubbing against the second toe within the same timeframe.  He initially visited urgent care ~2 days ago, was prescribed cephalexin, and returned with worsening swelling and toe wound despite therapy. He returned to urgent care and dose was increased but patient had not yet started the higher regimen and decided to head to the ED for further evaluation. He denies fever, chills, chest pain, SOB, or pain.   Right second toe x-ray showed bone destruction at the tip of the right second toe distal phalanx compatible with osteomyelitis. MRI right foot showed osteomyelitis of the second toe. There is a wound at the tip of the second toe which communicates with the bone. No obvious involvement of the proximal phalanx. Patient was treated with IV ceftriaxone  and vancomycin .  Past Medical History:  Diagnosis Date    Asthma 04-11-11   AS CHILD ONLY   BPH (benign prostatic hypertrophy) with urinary obstruction    Carpal tunnel syndrome    patient denies at 05/14/17 preop visit    Difficult intubation 04-11-11   08-11-97 some issues with intubation/record with chart   DJD (degenerative joint disease)    DM (diabetes mellitus) (HCC) 04-11-11   tyep II    DVT femoral (deep venous thrombosis) with thrombophlebitis (HCC) 04-11-11   ?'09-tx. on Xarelton x 3-4 years    GERD (gastroesophageal reflux disease) 04-11-11   mild, no med in 1 month   Gout 04-11-11    ankle 2 yrs ago   Hemorrhoids    Hypercholesteremia    Hypertension    Internal hemorrhoids    Keloid 04-11-11   multiple-arms,back, chest   Pulmonary nodule 04-11-11   PT. NOT AWARE -denies problems with breathing   Renal calculus    Thyroid  disease    hypothyroid    Past Surgical History:  Procedure Laterality Date   CATARACT EXTRACTION W/PHACO Left 10/28/2021   Procedure: CATARACT EXTRACTION PHACO AND INTRAOCULAR LENS PLACEMENT (IOC);  Surgeon: Harrie Agent, MD;  Location: [facility];  Service: Ophthalmology;  Laterality: Left;  CDE: 6.61   cataract surgery  12/2013   COLONOSCOPY WITH PROPOFOL  N/A 02/05/2017   Procedure: COLONOSCOPY WITH PROPOFOL ;  Surgeon: Aneita Gwendlyn DASEN, MD;  Location: [facility];  Service: Endoscopy;  Laterality: N/A;   COSMETIC SURGERY  2000   Keloid injections   CYSTOSCOPY WITH BIOPSY  04/14/2011   Procedure: CYSTOSCOPY WITH BIOPSY;  Surgeon: Noretta Ferrara, MD;  Location: [facility];  Service: Urology;  Laterality: N/A;  Bladder Biopsies    CYSTOSCOPY/RETROGRADE/URETEROSCOPY  04/14/2011   Procedure: CYSTOSCOPY/RETROGRADE/URETEROSCOPY;  Surgeon: Noretta Ferrara, MD;  Location: [facility];  Service: Urology;  Laterality: Bilateral;  (Bil) RPG    LAPAROSCOPIC NEPHRECTOMY Right 05/21/2017   Procedure: LAPAROSCOPIC RADICAL NEPHRECTOMY;  Surgeon: Ferrara Glance, MD;  Location: [facility];  Service: Urology;  Laterality: Right;   ortho surgery on  fingers  2005 & 2008   Dr. Jearld finger release   PROSTATECTOMY  1999   Dr. Nicholaus    History reviewed. No pertinent family history.  Social History:  reports that he has never smoked. He has never used smokeless tobacco. He reports that he does not drink alcohol and does not use drugs.  Allergies:  Allergies  Allergen Reactions   Ace Inhibitors Swelling    Angio edema (swelling of lips) Swelling around face Swelling in mouth   Allopurinol  Itching    Makes feet fill numb and tingle Pt stated he is not allergic to this 12-02-2023   Metformin  And Related Itching    Itching in higher doses    Medications: I have reviewed the patient's current medications.  Results for orders placed or performed during the hospital encounter of 12/01/23 (from the past 48 hours)  CBC with Differential     Status: None   Collection Time: 12/01/23 10:25 PM  Result Value Ref Range   WBC 6.8 4.0 - 10.5 K/uL   RBC 4.46 4.22 - 5.81 MIL/uL   Hemoglobin 13.9 13.0 - 17.0 g/dL   HCT 58.8 60.9 - 47.9 %   MCV 92.2 80.0 - 100.0 fL   MCH 31.2 26.0 - 34.0 pg   MCHC 33.8 30.0 - 36.0 g/dL   RDW 87.5 88.4 - 84.4 %   Platelets 198 150 - 400 K/uL   nRBC 0.0 0.0 - 0.2 %   Neutrophils Relative % 71 %   Neutro Abs 4.7 1.7 - 7.7 K/uL   Lymphocytes Relative 17 %   Lymphs Abs 1.2 0.7 - 4.0 K/uL   Monocytes Relative 9 %   Monocytes Absolute 0.6 0.1 - 1.0 K/uL   Eosinophils Relative 3 %   Eosinophils Absolute 0.2 0.0 - 0.5 K/uL   Basophils Relative 0 %   Basophils Absolute 0.0 0.0 - 0.1 K/uL   Immature Granulocytes 0 %   Abs Immature Granulocytes 0.03 0.00 - 0.07 K/uL    Comment: Performed at [facility], [address], [place], [place] [address]  Basic metabolic panel     Status: Abnormal   Collection Time: 12/01/23 10:25 PM  Result Value Ref Range   Sodium 141 135 - 145 mmol/L   Potassium 4.0 3.5 - 5.1 mmol/L   Chloride 104 98 - 111 mmol/L   CO2 27 22 - 32 mmol/L   Glucose, Bld 232 (H) 70 - 99  mg/dL    Comment: Glucose reference range applies only to samples taken after fasting for at least 8 hours.   BUN 22 8 - 23 mg/dL   Creatinine, Ser 7.79 (H) 0.61 - 1.24 mg/dL   Calcium  8.8 (L) 8.9 - 10.3 mg/dL   GFR, Estimated 29 (L) >60 mL/min    Comment: (NOTE) Calculated using the CKD-EPI Creatinine Equation (2021)    Anion gap 10 5 - 15    Comment: Performed at [facility], [address], [place], [place] [address]  Sedimentation rate     Status: Abnormal   Collection Time: 12/01/23  10:25 PM  Result Value Ref Range   Sed Rate 67 (H) 0 - 20 mm/hr    Comment: Performed at [facility], [address], [place], [place] [address]  C-reactive protein     Status: Abnormal   Collection Time: 12/01/23 10:25 PM  Result Value Ref Range   CRP 2.0 (H) <1.0 mg/dL    Comment: Performed at [facility], [address] [place], [place] [address]  Hemoglobin A1c     Status: Abnormal   Collection Time: 12/01/23 10:25 PM  Result Value Ref Range   Hgb A1c MFr Bld 7.2 (H) 4.8 - 5.6 %    Comment: (NOTE) Diagnosis of Diabetes The following HbA1c ranges recommended by the American Diabetes Association (ADA) may be used as an aid in the diagnosis of diabetes mellitus.  Hemoglobin             Suggested A1C NGSP%              Diagnosis  <5.7                   Non Diabetic  5.7-6.4                Pre-Diabetic  >6.4                   Diabetic  <7.0                   Glycemic control for                       adults with diabetes.     Mean Plasma Glucose 159.94 mg/dL    Comment: Performed at [facility], [address] [place], [place] [address]  Glucose, capillary     Status: Abnormal   Collection Time: 12/02/23  3:47 AM  Result Value Ref Range   Glucose-Capillary 144 (H) 70 - 99 mg/dL    Comment: Glucose reference range applies only to samples taken after fasting for at least 8 hours.  Comprehensive metabolic panel     Status: Abnormal   Collection Time: 12/02/23  5:29 AM   Result Value Ref Range   Sodium 141 135 - 145 mmol/L   Potassium 4.0 3.5 - 5.1 mmol/L   Chloride 105 98 - 111 mmol/L   CO2 28 22 - 32 mmol/L   Glucose, Bld 238 (H) 70 - 99 mg/dL    Comment: Glucose reference range applies only to samples taken after fasting for at least 8 hours.   BUN 21 8 - 23 mg/dL   Creatinine, Ser 7.92 (H) 0.61 - 1.24 mg/dL   Calcium  8.5 (L) 8.9 - 10.3 mg/dL   Total Protein 6.5 6.5 - 8.1 g/dL   Albumin 3.1 (L) 3.5 - 5.0 g/dL   AST 12 (L) 15 - 41 U/L   ALT 11 0 - 44 U/L   Alkaline Phosphatase 61 38 - 126 U/L   Total Bilirubin 0.6 0.0 - 1.2 mg/dL   GFR, Estimated 31 (L) >60 mL/min    Comment: (NOTE) Calculated using the CKD-EPI Creatinine Equation (2021)    Anion gap 8 5 - 15    Comment: Performed at [facility], [address] [place], [place] [address]  CBC     Status: Abnormal   Collection Time: 12/02/23  5:29 AM  Result Value Ref Range   WBC 7.0 4.0 - 10.5 K/uL   RBC 4.21 (L)  4.22 - 5.81 MIL/uL   Hemoglobin 13.0 13.0 - 17.0 g/dL   HCT 61.2 (L) 60.9 - 47.9 %   MCV 91.9 80.0 - 100.0 fL   MCH 30.9 26.0 - 34.0 pg   MCHC 33.6 30.0 - 36.0 g/dL   RDW 87.5 88.4 - 84.4 %   Platelets 181 150 - 400 K/uL   nRBC 0.0 0.0 - 0.2 %    Comment: Performed at [facility], [address], [place], [place] [address]  Magnesium      Status: None   Collection Time: 12/02/23  5:29 AM  Result Value Ref Range   Magnesium  2.3 1.7 - 2.4 mg/dL    Comment: Performed at [facility], [address], [place], [place] [address]  Phosphorus     Status: None   Collection Time: 12/02/23  5:29 AM  Result Value Ref Range   Phosphorus 2.5 2.5 - 4.6 mg/dL    Comment: Performed at [facility], [address] [place], [place] [address]  Glucose, capillary     Status: Abnormal   Collection Time: 12/02/23  8:01 AM  Result Value Ref Range   Glucose-Capillary 149 (H) 70 - 99 mg/dL    Comment: Glucose reference range applies only to samples taken after fasting for at least 8 hours.    Comment 1 Document in Chart   Glucose, capillary     Status: Abnormal   Collection Time: 12/02/23 11:36 AM  Result Value Ref Range   Glucose-Capillary 118 (H) 70 - 99 mg/dL    Comment: Glucose reference range applies only to samples taken after fasting for at least 8 hours.   Comment 1 Document in Chart   Glucose, capillary     Status: Abnormal   Collection Time: 12/02/23  4:59 PM  Result Value Ref Range   Glucose-Capillary 139 (H) 70 - 99 mg/dL    Comment: Glucose reference range applies only to samples taken after fasting for at least 8 hours.  Glucose, capillary     Status: Abnormal   Collection Time: 12/02/23  7:42 PM  Result Value Ref Range   Glucose-Capillary 194 (H) 70 - 99 mg/dL    Comment: Glucose reference range applies only to samples taken after fasting for at least 8 hours.  CBC     Status: Abnormal   Collection Time: 12/03/23  5:17 AM  Result Value Ref Range   WBC 6.5 4.0 - 10.5 K/uL   RBC 4.22 4.22 - 5.81 MIL/uL   Hemoglobin 13.0 13.0 - 17.0 g/dL   HCT 61.5 (L) 60.9 - 47.9 %   MCV 91.0 80.0 - 100.0 fL   MCH 30.8 26.0 - 34.0 pg   MCHC 33.9 30.0 - 36.0 g/dL   RDW 87.7 88.4 - 84.4 %   Platelets 178 150 - 400 K/uL   nRBC 0.0 0.0 - 0.2 %    Comment: Performed at [facility], [address] [place], [place] [address]  Basic metabolic panel with GFR     Status: Abnormal   Collection Time: 12/03/23  5:17 AM  Result Value Ref Range   Sodium 141 135 - 145 mmol/L   Potassium 4.0 3.5 - 5.1 mmol/L   Chloride 106 98 - 111 mmol/L   CO2 27 22 - 32 mmol/L   Glucose, Bld 110 (H) 70 - 99 mg/dL    Comment: Glucose reference range applies only to samples taken after fasting for at least 8 hours.   BUN 21 8 - 23 mg/dL  Creatinine, Ser 2.26 (H) 0.61 - 1.24 mg/dL   Calcium  8.6 (L) 8.9 - 10.3 mg/dL   GFR, Estimated 28 (L) >60 mL/min    Comment: (NOTE) Calculated using the CKD-EPI Creatinine Equation (2021)    Anion gap 8 5 - 15    Comment: Performed at [facility], [address], [place], [place] [address]  Glucose, capillary     Status: Abnormal   Collection Time: 12/03/23  7:32 AM  Result Value Ref Range   Glucose-Capillary 101 (H) 70 - 99 mg/dL    Comment: Glucose reference range applies only to samples taken after fasting for at least 8 hours.  Glucose, capillary     Status: Abnormal   Collection Time: 12/03/23 11:21 AM  Result Value Ref Range   Glucose-Capillary 120 (H) 70 - 99 mg/dL    Comment: Glucose reference range applies only to samples taken after fasting for at least 8 hours.    MR FOOT RIGHT WO CONTRAST Result Date: 12/03/2023 MR FOOT WITHOUT IV CONTRAST RIGHT COMPARISON: None. CLINICAL HISTORY: Second toe swelling. PULSE SEQUENCES: Ax T1, Ax T2 FS, Sag T1, Sag T2 FS, Cor STIR, Cor T1 without contrast. FINDINGS: There is a wound at the tip of the second toe. There is destruction of the distal phalanx of the second toe consistent with acute osteomyelitis. There is mild edema in the second middle phalanx suggesting involvement. There is no interphalangeal effusion present. No obvious involvement of the proximal phalanx. Otherwise, the bones of the forefoot demonstrate mild degenerative change without marrow edema. Mild degenerative changes in the midfoot. Moderate subcutaneous edema is identified in the dorsum of the foot and in the second toe. There is no significant tenosynovitis or tendinosis. No drainable collection. IMPRESSION: Osteomyelitis of the second toe as above. There is a wound at the tip of the second toe which communicates with the bone. No obvious involvement of the proximal phalanx. Moderate dorsal subcutaneous edema without drainable collection. Electronically signed by: Norleen Satchel MD 12/03/2023 10:40 AM EDT RP Workstation: [id number]   US  Venous Img Lower Unilateral Right (DVT) Result Date: 12/02/2023 CLINICAL DATA:  83 year old male with right lower extremity swelling. EXAM: RIGHT LOWER EXTREMITY VENOUS DOPPLER ULTRASOUND TECHNIQUE:  Gray-scale sonography with graded compression, as well as color Doppler and duplex ultrasound were performed to evaluate the right lower extremity deep venous systems from the level of the common femoral vein and including the common femoral, femoral, profunda femoral, popliteal and calf veins including the posterior tibial, peroneal and gastrocnemius veins when visible. Spectral Doppler was utilized to evaluate flow at rest and with distal augmentation maneuvers in the common femoral, femoral and popliteal veins. The contralateral common femoral vein was also evaluated for comparison. COMPARISON:  None Available. FINDINGS: RIGHT LOWER EXTREMITY Common Femoral Vein: No evidence of thrombus. Normal compressibility, respiratory phasicity and response to augmentation. Central Greater Saphenous Vein: No evidence of thrombus. Normal compressibility and flow on color Doppler imaging. Central Profunda Femoral Vein: No evidence of thrombus. Normal compressibility and flow on color Doppler imaging. Femoral Vein: There is nonocclusive, non expansile, slightly hyperechoic and wall adherent thrombus visualized in the mid femoral vein. There is occlusive, mildly expansile thrombus visualized in the peripheral aspect of the femoral vein. Popliteal Vein: There is nonocclusive, non expansile, slightly hyperechoic wall adherent thrombus visualized. Calf Veins: No evidence of thrombus. Normal compressibility and flow on color Doppler imaging. Venous Reflux:  None. Other Findings:  None. LEFT LOWER EXTREMITY Common Femoral Vein: No evidence of  thrombus. Normal compressibility, respiratory phasicity and response to augmentation. IMPRESSION: Deep vein thrombosis limited to the right popliteal and femoral vein which appears mostly chronic however there is a short segment of acute appearing thrombus in the peripheral femoral vein. No evidence of iliocaval extension. Ester Sides, MD [facility]  [facility] Electronically Signed   By: Ester Sides M.D.   On: 12/02/2023 21:47   DG Toe 2nd Right Result Date: 12/02/2023 CLINICAL DATA:  Swelling, question osteomyelitis EXAM: RIGHT SECOND TOE COMPARISON:  None Available. FINDINGS: There is bone destruction at the tip of the right 2nd toe distal phalanx compatible with osteomyelitis. Overlying soft tissue swelling. No subluxation or dislocation. No fracture. IMPRESSION: Bone destruction at the tip of the right 2nd toe distal phalanx compatible with osteomyelitis. Electronically Signed   By: Franky Crease M.D.   On: 12/02/2023 00:04    ROS:  Pertinent items noted in HPI and remainder of comprehensive ROS otherwise negative.  Blood pressure 133/71, pulse 66, temperature 99.1 F (37.3 C), temperature source Oral, resp. rate 17, height 6' 2 (1.88 m), weight 87.3 kg, SpO2 98%.  Physical Exam Constitutional:      Appearance: Normal appearance.  HENT:     Head: Normocephalic and atraumatic.  Eyes:     Extraocular Movements: Extraocular movements intact.     Pupils: Pupils are equal, round, and reactive to light.  Cardiovascular:     Rate and Rhythm: Normal rate and regular rhythm.     Pulses:          Popliteal pulses are 2+ on the right side and 2+ on the left side.       Dorsalis pedis pulses are 1+ on the right side and 1+ on the left side.  Pulmonary:     Effort: Pulmonary effort is normal.     Breath sounds: Normal breath sounds.  Abdominal:     General: There is no distension.     Palpations: Abdomen is soft.  Musculoskeletal:     Right lower leg: Swelling present. 2+ Edema present.     Left lower leg: 1+ Edema present.     Right foot: Swelling present.     Comments: Ulcer noted on distal right second toe  Skin:    General: Skin is warm.  Neurological:     General: No focal deficit present.     Mental Status: He is alert and oriented to person, place, and time.  Psychiatric:        Mood and Affect: Mood normal.       Assessment/Plan: Hector Rose is a 83 y/o male with poorly controlled diabetes and CKD, presenting with right second toe wound and imaging concerning for osteomyelitis. Currently receiving broad-spectrum antibiotics. Will likely require partial amputation of the second right distal toe.   Plan - Continue IV ceftriaxone  + vancomycin  - Proceed with partial amputation of distal right toe, possibly on Wednesday to allow patient time to discuss the surgery with his wife - Continue to hold Xarelto   - Consider getting ABI - Will likely keep NPO on Tuesday for possible surgery on Wednesday   Hector Rose 12/03/2023, 2:49 PM

## 2023-12-03 NOTE — Progress Notes (Signed)
 Mobility Specialist Progress Note:    12/03/23 1050  Mobility  Activity Ambulated with assistance  Level of Assistance Independent  Assistive Device None  Distance Ambulated (ft) 1200 ft  Range of Motion/Exercises Active;All extremities  Activity Response Tolerated well  Mobility Referral Yes  Mobility visit 1 Mobility  Mobility Specialist Start Time (ACUTE ONLY) 1050  Mobility Specialist Stop Time (ACUTE ONLY) 1110  Mobility Specialist Time Calculation (min) (ACUTE ONLY) 20 min   Pt received in bed, agreeable to mobility. Independently able to stand and ambulate with no AD. Tolerated well,asx throughout. Returned supine, all needs met.  Delight Bickle Mobility Specialist Please contact via Special educational needs teacher or  Rehab office at [phone]

## 2023-12-03 NOTE — Consult Note (Signed)
 NAME: Hector Rose  DOB: 1940-06-08  MRN: [id number]  Date/Time: 12/03/2023 7:01 PM  REQUESTING PROVIDER : Dr. Willette Subjective:  REASON FOR CONSULT: osteo rt 2nd toe ? This is Tele medicine video visit  Hector Rose is a 83 y.o. male with a history of DM,on insulin  peripheral neuropathy, HTN, HLD, DVT left leg , rt nephrectomy for renal clear cell carcinoma, BPH PRESENTS WITH rt foot swelling and shoe getting tighter for a few days. He then noticed a wound on the rt 2nd toe toe on 9/202/25 . He does not remember any obvious trauma but thinks it could be the shoe as it got tighter.But he does not know how long he had it-  [facility] and they prescribed keflex smaller dose, but returned to them next day and given higher strength, but he came to ED instead. No fever, no chills  12/01/23  BP 138/74  Temp 98.9 F (37.2 C)  Pulse Rate 67  Resp 18  SpO2 100 %  Weight 190 lb    Latest Reference Range & Units 12/01/23  WBC 4.0 - 10.5 K/uL 6.8  Hemoglobin 13.0 - 17.0 g/dL 86.0  HCT 60.9 - 47.9 % 41.1  Platelets 150 - 400 K/uL 198  Creatinine 0.61 - 1.24 mg/dL 7.79 (H)   Xray foot showed destruction of the distal phalanx 2nd toe MRI foot . There is destruction of the distal phalanx of the second toe consistent with acute osteomyelitis.  Pt started on vanco and ceftriaxone  I am asked to see the patient  for the 2nd toe osteo US  of the rt leg shows chronic DVT rt popliteal vein and femoral vein  Past Medical History:  Diagnosis Date   Asthma 04-11-11   AS CHILD ONLY   BPH (benign prostatic hypertrophy) with urinary obstruction    Carpal tunnel syndrome    patient denies at 05/14/17 preop visit    Difficult intubation 04-11-11   08-11-97 some issues with intubation/record with chart   DJD (degenerative joint disease)    DM (diabetes mellitus) (HCC) 04-11-11   tyep II    DVT femoral (deep venous thrombosis) with thrombophlebitis (HCC) 04-11-11   ?'09-tx. on Xarelton  x 3-4 years    GERD (gastroesophageal reflux disease) 04-11-11   mild, no med in 1 month   Gout 04-11-11    ankle 2 yrs ago   Hemorrhoids    Hypercholesteremia    Hypertension    Internal hemorrhoids    Keloid 04-11-11   multiple-arms,back, chest   Pulmonary nodule 04-11-11   PT. NOT AWARE -denies problems with breathing   Renal calculus    Thyroid  disease    hypothyroid    Past Surgical History:  Procedure Laterality Date   CATARACT EXTRACTION W/PHACO Left 10/28/2021   Procedure: CATARACT EXTRACTION PHACO AND INTRAOCULAR LENS PLACEMENT (IOC);  Surgeon: Harrie Agent, MD;  Location: [facility];  Service: Ophthalmology;  Laterality: Left;  CDE: 6.61   cataract surgery  12/2013   COLONOSCOPY WITH PROPOFOL  N/A 02/05/2017   Procedure: COLONOSCOPY WITH PROPOFOL ;  Surgeon: Aneita Gwendlyn DASEN, MD;  Location: [facility];  Service: Endoscopy;  Laterality: N/A;   COSMETIC SURGERY  2000   Keloid injections   CYSTOSCOPY WITH BIOPSY  04/14/2011   Procedure: CYSTOSCOPY WITH BIOPSY;  Surgeon: Noretta Ferrara, MD;  Location: [facility];  Service: Urology;  Laterality: N/A;  Bladder Biopsies    CYSTOSCOPY/RETROGRADE/URETEROSCOPY  04/14/2011   Procedure: CYSTOSCOPY/RETROGRADE/URETEROSCOPY;  Surgeon: Noretta Ferrara, MD;  Location:  [facility];  Service: Urology;  Laterality: Bilateral;  (Bil) RPG    LAPAROSCOPIC NEPHRECTOMY Right 05/21/2017   Procedure: LAPAROSCOPIC RADICAL NEPHRECTOMY;  Surgeon: Renda Glance, MD;  Location: [facility];  Service: Urology;  Laterality: Right;   ortho surgery on fingers  2005 & 2008   Dr. Jearld finger release   PROSTATECTOMY  1999   Dr. 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: Not on file  Food Insecurity: No Food Insecurity (12/02/2023)   Hunger Vital Sign    Worried About Running Out of Food in the Last Year: Never true    Ran Out of Food in the Last Year: Never true  Transportation Needs: No Transportation Needs (12/02/2023)   PRAPARE - Administrator, Civil Service (Medical): No    Lack of Transportation (Non-Medical): No  Physical Activity: Not on file  Stress: Not on file  Social Connections: Socially Integrated (12/02/2023)   Social Connection and Isolation Panel    Frequency of Communication with Friends and Family: More than three times a week    Frequency of Social Gatherings with Friends and Family: Three times a week    Attends Religious Services: More than 4 times per year    Active Member of Clubs or Organizations: Yes    Attends Banker Meetings: More than 4 times per year    Marital Status: Married  Catering manager Violence: Not At Risk (12/02/2023)   Humiliation, Afraid, Rape, and Kick questionnaire    Fear of Current or Ex-Partner: No    Emotionally Abused: No    Physically Abused: No    Sexually Abused: No    History reviewed. No pertinent family history. Allergies  Allergen Reactions   Ace Inhibitors Swelling    Angio edema (swelling of lips) Swelling around face Swelling in mouth   Allopurinol  Itching    Makes feet fill numb and tingle Pt stated he is not allergic to this 12-02-2023   Metformin  And Related Itching    Itching in higher doses   I? Current Facility-Administered Medications  Medication Dose Route Frequency Provider Last Rate Last Admin   acetaminophen  (TYLENOL ) tablet 650 mg  650 mg Oral  Q6H PRN Adefeso, Oladapo, DO       Or   acetaminophen  (TYLENOL ) suppository 650 mg  650 mg Rectal Q6H PRN Adefeso, Oladapo, DO       amLODipine  (NORVASC ) tablet 10 mg  10 mg Oral q morning Adefeso, Oladapo, DO   10 mg at 12/03/23 0802   cefTRIAXone  (ROCEPHIN ) 2 g in sodium chloride  0.9 % 100 mL IVPB  2 g Intravenous Q24H Adefeso, Oladapo, DO 200 mL/hr at 12/03/23 0009 2 g at 12/03/23 0009   enoxaparin  (LOVENOX ) injection  40 mg  40 mg Subcutaneous Q24H Shahmehdi, Seyed A, MD   40 mg at 12/02/23 2233   insulin  aspart (novoLOG ) injection 0-9 Units  0-9 Units Subcutaneous TID WC Adefeso, Oladapo, DO   1 Units at 12/02/23 1740   metoprolol  tartrate (LOPRESSOR ) tablet 25 mg  25 mg Oral BID Adefeso, Oladapo, DO   25 mg at 12/03/23 9197   ondansetron  (ZOFRAN ) tablet 4 mg  4 mg Oral Q6H PRN Adefeso, Oladapo, DO       Or   ondansetron  (ZOFRAN ) injection 4 mg  4 mg Intravenous Q6H PRN Adefeso, Oladapo, DO       pantoprazole  (PROTONIX ) EC tablet 40 mg  40 mg Oral Daily Adefeso, Oladapo, DO   40 mg at 12/03/23 0802   [START ON 12/04/2023] vancomycin  (VANCOREADY) IVPB 1750 mg/350 mL  1,750 mg Intravenous Q48H Shahmehdi, Seyed A, MD         Abtx:  Anti-infectives (From admission, onward)    Start     Dose/Rate Route Frequency Ordered Stop   12/04/23 1000  vancomycin  (VANCOREADY) IVPB 1750 mg/350 mL        1,750 mg 175 mL/hr over 120 Minutes Intravenous Every 48 hours 12/03/23 0927     12/03/23 0030  cefTRIAXone  (ROCEPHIN ) 2 g in sodium chloride  0.9 % 100 mL IVPB        2 g 200 mL/hr over 30 Minutes Intravenous Every 24 hours 12/02/23 0438     12/02/23 1000  vancomycin  (VANCOREADY) IVPB 1250 mg/250 mL  Status:  Discontinued        1,250 mg 166.7 mL/hr over 90 Minutes Intravenous Every 48 hours 12/02/23 0625 12/03/23 0927   12/02/23 0215  cefTRIAXone  (ROCEPHIN ) 2 g in sodium chloride  0.9 % 100 mL IVPB        2 g 200 mL/hr over 30 Minutes Intravenous  Once 12/02/23 0209 12/02/23 0259   12/02/23 0215   vancomycin  (VANCOCIN ) IVPB 1000 mg/200 mL premix        1,000 mg 200 mL/hr over 60 Minutes Intravenous  Once 12/02/23 0209 12/02/23 0329       REVIEW OF SYSTEMS:  Const: negative fever, negative chills, negative weight loss Eyes: negative diplopia or visual changes, negative eye pain ENT: negative coryza, negative sore throat Resp: negative cough, hemoptysis, dyspnea Cards: negative for chest pain, palpitations, lower extremity edema GU: negative for frequency, dysuria and hematuria GI: Negative for abdominal pain, diarrhea, bleeding, constipation Skin: negative for rash and pruritus Heme: negative for easy bruising and gum/nose bleeding MS: negative for myalgias, arthralgias, back pain and muscle weakness Neurolo:negative for headaches, dizziness, vertigo, memory problems  Psych: negative for feelings of anxiety, depression  Endocrine: diabetes Allergy/Immunology- as above Objective:  VITALS:  BP 133/71 (BP Location: Right Arm)   Pulse 66   Temp 99.1 F (37.3 C) (Oral)   Resp 17   Ht 6' 2 (1.88 m)   Wt 87.3 kg   SpO2 98%   BMI 24.71 kg/m   PHYSICAL EXAM:  General: Alert, cooperative, no distress, looks young for his age Head: Normocephalic, without obvious abnormality, atraumatic. Eyes: Conjunctivae clear, anicteric sclerae. Pupils are equal ENT Nares normal. No drainage or sinus tenderness. Lips, mucosa, and tongue normal. No Thrush Neck: , symmetrical, no adenopathy, thyroid : non tender no carotid bruit and no JVD.  Lungs: cannot be examined Heart: cannot be examined Abdomen: looks fine Extremities: rt leg swollen >> rt     Ankle and foot swollen 2nd toe- tip  there is an ulcer Skin: No rashes or lesions. Or bruising Lymph: Cervical, supraclavicular normal. Neurologic: Grossly non-focal Pertinent Labs Lab Results CBC    Component Value Date/Time   WBC 6.5 12/03/2023 0517   RBC 4.22 12/03/2023 0517   HGB 13.0 12/03/2023 0517   HCT 38.4 (L) 12/03/2023  0517   PLT 178 12/03/2023 0517   MCV 91.0 12/03/2023 0517   MCH 30.8 12/03/2023 0517   MCHC 33.9 12/03/2023 0517   RDW 12.2 12/03/2023 0517   LYMPHSABS 1.2 12/01/2023 2225   MONOABS 0.6 12/01/2023 2225   EOSABS 0.2 12/01/2023 2225   BASOSABS 0.0 12/01/2023 2225       Latest Ref Rng & Units 12/03/2023    5:17 AM 12/02/2023    5:29 AM 12/01/2023   10:25 PM  CMP  Glucose 70 - 99 mg/dL 889  761  767   BUN 8 - 23 mg/dL 21  21  22    Creatinine 0.61 - 1.24 mg/dL 7.73  7.92  7.79   Sodium 135 - 145 mmol/L 141  141  141   Potassium 3.5 - 5.1 mmol/L 4.0  4.0  4.0   Chloride 98 - 111 mmol/L 106  105  104   CO2 22 - 32 mmol/L 27  28  27    Calcium  8.9 - 10.3 mg/dL 8.6  8.5  8.8   Total Protein 6.5 - 8.1 g/dL  6.5    Total Bilirubin 0.0 - 1.2 mg/dL  0.6    Alkaline Phos 38 - 126 U/L  61    AST 15 - 41 U/L  12    ALT 0 - 44 U/L  11        Microbiology: No blood or wound culture   IMAGING RESULTS: Xray foot- bony destruction 2nd toe distal phalanx   I have personally reviewed the films ? Impression/Recommendation ? Diabetes mellitus with peripheral neuropathy  Chronic DVT with acute DVT rt leg leading to swelling of leg/foot This is on Xarelto  which he was taking for a previous DVT left leg  Rt great toe ulcer- at the tip of the toe With underlying osteomyelitis of the distal phalanx Pt is on ceftriaxone  and vanco This ulcer is not overtly infected- no systemic symptoms No leucocytosis A culture should be sent from the ulcer Pt is currently on ceftriaxone  and vanco Will DC vanco because of high creatinine and no need for MRSA coverage now unless the culture shows MRSA  Pt would benefit from simple amputation of the distal phalanx but need to check arterial pressure Also to take into consideration the swelling of the feet in wound healing  DM - A1c 7.2 He is on insulin   CKD- avoid nephrotoxic drugs  H/o rt nephrectomy due to renal cell ca  BPH h/o prostatectomy  H/o  left DVT This consult involved complex antimicrobial management ? I have personally spent  60---minutes involved in face-to-face and non-face-to-face activities for this patient on the day of the visit. Professional time spent includes the following activities: Preparing to see the patient (review of tests), Obtaining and/or reviewing separately obtained history (admission/discharge record), Performing a medically appropriate examination and/or evaluation , Ordering medications/tests/procedures, referring and communicating with other health care professionals, Documenting clinical information in the EMR, Independently interpreting results (not separately reported), Communicating results to the patient/, Counseling and educating the patient/f and Care coordination with the nurse and care team  ________________________________________________

## 2023-12-04 DIAGNOSIS — I82511 Chronic embolism and thrombosis of right femoral vein: Secondary | ICD-10-CM

## 2023-12-04 DIAGNOSIS — Z794 Long term (current) use of insulin: Secondary | ICD-10-CM

## 2023-12-04 DIAGNOSIS — L97519 Non-pressure chronic ulcer of other part of right foot with unspecified severity: Secondary | ICD-10-CM

## 2023-12-04 DIAGNOSIS — I82531 Chronic embolism and thrombosis of right popliteal vein: Secondary | ICD-10-CM

## 2023-12-04 DIAGNOSIS — I10 Essential (primary) hypertension: Secondary | ICD-10-CM

## 2023-12-04 DIAGNOSIS — M86171 Other acute osteomyelitis, right ankle and foot: Secondary | ICD-10-CM

## 2023-12-04 DIAGNOSIS — M869 Osteomyelitis, unspecified: Secondary | ICD-10-CM | POA: Diagnosis not present

## 2023-12-04 DIAGNOSIS — E11621 Type 2 diabetes mellitus with foot ulcer: Secondary | ICD-10-CM

## 2023-12-04 DIAGNOSIS — I82411 Acute embolism and thrombosis of right femoral vein: Secondary | ICD-10-CM

## 2023-12-04 LAB — CBC
HCT: 38.5 % — ABNORMAL LOW (ref 39.0–52.0)
Hemoglobin: 13.3 g/dL (ref 13.0–17.0)
MCH: 31.1 pg (ref 26.0–34.0)
MCHC: 34.5 g/dL (ref 30.0–36.0)
MCV: 90.2 fL (ref 80.0–100.0)
Platelets: 183 K/uL (ref 150–400)
RBC: 4.27 MIL/uL (ref 4.22–5.81)
RDW: 12.1 % (ref 11.5–15.5)
WBC: 6.9 K/uL (ref 4.0–10.5)
nRBC: 0 % (ref 0.0–0.2)

## 2023-12-04 LAB — GLUCOSE, CAPILLARY
Glucose-Capillary: 124 mg/dL — ABNORMAL HIGH (ref 70–99)
Glucose-Capillary: 113 mg/dL — ABNORMAL HIGH (ref 70–99)
Glucose-Capillary: 162 mg/dL — ABNORMAL HIGH (ref 70–99)
Glucose-Capillary: 93 mg/dL (ref 70–99)
Glucose-Capillary: 122 mg/dL — ABNORMAL HIGH (ref 70–99)

## 2023-12-04 LAB — BASIC METABOLIC PANEL WITH GFR
Anion gap: 13 (ref 5–15)
BUN: 23 mg/dL (ref 8–23)
CO2: 27 mmol/L (ref 22–32)
Calcium: 8.7 mg/dL — ABNORMAL LOW (ref 8.9–10.3)
Chloride: 102 mmol/L (ref 98–111)
Creatinine, Ser: 2.17 mg/dL — ABNORMAL HIGH (ref 0.61–1.24)
GFR, Estimated: 30 mL/min — ABNORMAL LOW (ref >60)
Glucose, Bld: 105 mg/dL — ABNORMAL HIGH (ref 70–99)
Potassium: 4.1 mmol/L (ref 3.5–5.1)
Sodium: 142 mmol/L (ref 135–145)

## 2023-12-04 MED ORDER — HEPARIN (PORCINE) 25000 UT/250ML-% IV SOLN
1050.0000 [IU]/h | INTRAVENOUS | Status: DC
  Administered 2023-12-04: 1050 [IU]/h via INTRAVENOUS
  Filled 2023-12-04: qty 250

## 2023-12-04 NOTE — Plan of Care (Signed)
   Problem: Education: Goal: Knowledge of General Education information will improve Description: Including pain rating scale, medication(s)/side effects and non-pharmacologic comfort measures Outcome: Progressing   Problem: Activity: Goal: Risk for activity intolerance will decrease Outcome: Progressing   Problem: Nutrition: Goal: Adequate nutrition will be maintained Outcome: Progressing

## 2023-12-04 NOTE — Progress Notes (Signed)
 PHARMACY - ANTICOAGULATION CONSULT NOTE  Pharmacy Consult for heparin  Indication: DVT  Allergies  Allergen Reactions   Ace Inhibitors Swelling    Angio edema (swelling of lips) Swelling around face Swelling in mouth   Allopurinol  Itching    Makes feet fill numb and tingle Pt stated he is not allergic to this 12-02-2023   Metformin  And Related Itching    Itching in higher doses    Patient Measurements: Height: 6' 2 (188 cm) Weight: 87.3 kg (192 lb 7.4 oz) IBW/kg (Calculated) : 82.2 HEPARIN  DW (KG): 86.2  Vital Signs: Temp: 98.4 F (36.9 C) (09/23 0652) Temp Source: Oral (09/23 0652) BP: 144/75 (09/23 0652) Pulse Rate: 69 (09/23 0652)  Labs: Recent Labs    12/02/23 0529 12/03/23 0517 12/04/23 0353  HGB 13.0 13.0 13.3  HCT 38.7* 38.4* 38.5*  PLT 181 178 183  CREATININE 2.07* 2.26* 2.17*    Estimated Creatinine Clearance: 30.5 mL/min (A) (by C-G formula based on SCr of 2.17 mg/dL (H)).   Medical History: Past Medical History:  Diagnosis Date   Asthma 04-11-11   AS CHILD ONLY   BPH (benign prostatic hypertrophy) with urinary obstruction    Carpal tunnel syndrome    patient denies at 05/14/17 preop visit    Difficult intubation 04-11-11   08-11-97 some issues with intubation/record with chart   DJD (degenerative joint disease)    DM (diabetes mellitus) (HCC) 04-11-11   tyep II    DVT femoral (deep venous thrombosis) with thrombophlebitis (HCC) 04-11-11   ?'09-tx. on Xarelton x 3-4 years    GERD (gastroesophageal reflux disease) 04-11-11   mild, no med in 1 month   Gout 04-11-11    ankle 2 yrs ago   Hemorrhoids    Hypercholesteremia    Hypertension    Internal hemorrhoids    Keloid 04-11-11   multiple-arms,back, chest   Pulmonary nodule 04-11-11   PT. NOT AWARE -denies problems with breathing   Renal calculus    Thyroid  disease    hypothyroid    Assessment: 83 year old male admitted to [facility] for leg swelling found to have toe osteomyelitis. Patient was on  xarelto  pta for history of DVT, his last dose prior to admission was 9/19. He has been receiving sq lovenox  during admission. New orders to transition to IV heparin  until surgery times are more defined. Will omit heparin  bolus given recent lovenox  injection last night. CBC has remained stable this admit.    Goal of Therapy:  Heparin  level 0.3-0.7 units/ml aPTT 66-102 seconds Monitor platelets by anticoagulation protocol: Yes   Plan:  Start heparin  infusion at 1050 units/hr Check anti-Xa level in 8 hours and daily while on heparin , will also check aptt's in case patient has residual xarelto  on board Continue to monitor H&H and platelets  Dempsey Blush PharmD., BCPS Clinical Pharmacist 12/04/2023 11:25 AM

## 2023-12-04 NOTE — Progress Notes (Signed)
 PROGRESS NOTE    Patient: Hector Rose                            PCP: Marvine Rush, MD                    DOB: [birth year]            DOA: 12/01/2023 FMW:[id number]             DOS: 12/04/2023, 10:50 AM   LOS: 2 days   Date of Service: The patient was seen and examined on 12/04/2023  Subjective:   The patient was seen and examined this morning, hemodynamically stable. In no acute distress, extensive discussion with general surgery Dr. Kallie regarding amputation of the second toe.  Addendum: Dr. Kallie deferred to case to vascular surgery Dr. Jakarri     Brief Narrative:    Hector Rose is a 83 y.o. male with medical history significant of T2DM, hypertension, gout, GERD, DVT on Xarelto  who presents to the emergency department due to 1 week onset of swelling of right foot and right second toe wound.  He states that he started noticing difficulty in being able to put on his right shoe and that his shoe has been rubbing against the second toe within the same timeframe.  He went to an urgent care about 2 days ago and was prescribed with cephalexin, he returned to the urgent care today due to no improvement in leg swelling and toe wound and his dose was increased to 500 mg twice daily (he has not started to take this), but decided to go to the ED for further evaluation.  He denies fever, chest pain, shortness of breath, chills, pain.   ED Course:  Hemodynamically stable.  Workup in ED showed normal CBC and BMP except for blood glucose of 232 and creatinine of 2.20 (baseline creatinine about 2.0-2.1).  Sed rate 67. Right second toe x-ray showed bone destruction at the tip of the right second toe distal phalanx compatible with osteomyelitis. Patient was treated with IV ceftriaxone  and vancomycin . Orthopedic surgeon on-call Dr. Sharl was consulted and there was no indication for any emergent orthopedic intervention and if amputation is necessary, patient can be referred to Dr. Harden as an  outpatient per EDP.    Assessment & Plan:   Principal Problem:   Osteomyelitis of second toe of right foot (HCC) Active Problems:   Essential hypertension   GERD (gastroesophageal reflux disease)   Type 2 diabetes mellitus with hyperglycemia (HCC)   CKD (chronic kidney disease), stage IV (HCC)   Swelling of right lower extremity    Osteomyelitis of second toe of right foot Right second toe x-ray was suggestive of osteomyelitis -MRI right foot: Osteomyelitis of the second toe. There is a wound at the tip of the second toe which communicates with the bone. No obvious involvement of the proximal phalanx.  ID: Consulted, switched Vanco/Rocephin --to Rocephin  -- Consulting general surgery Dr. Kallie -we discussed this case with vascular surgery Dr. Katheryn at [facility] on ABI + peripheral vascular disease-recommend further workup including angiography  Recommended transfer to [facility] for further vascular evaluation and possible amputation there  ABI: Right lower extremity borderline abnormal,-anterior tibial territory suggestive moderate disease.  Left lower extremity-within normal limits   Type 2 diabetes mellitus with hyperglycemia Hemoglobin A1c about 3 months ago was 7.8 Continue ISS and hypoglycemia protocol   CKD stage IV Creatinine of 2.20 (baseline  creatinine about 2.0-2.1) Renally adjust medications, avoid nephrotoxic agents/dehydration/hypotension Lab Results  Component Value Date   CREATININE 2.17 (H) 12/04/2023   CREATININE 2.26 (H) 12/03/2023   CREATININE 2.07 (H) 12/02/2023      Lower extremity DVT/right lower extremity edema Xarelto  will be temporarily on HOLD -at this time in anticipation for possible surgical intervention of patient's right second toe osteomyelitis -Starting heparin  drip--   US ;  RTlower extremities Deep vein thrombosis limited to the right popliteal and femoral vein which appears mostly chronic however there is a short segment of acute  appearing thrombus in the peripheral femoral vein.    Essential hypertension Continue amlodipine , Lopressor    GERD Continue Protonix     ------------------------------------------------------------------------------------------------ Nutritional status:  The patient's BMI is: Body mass index is 24.71 kg/m. I agree with the assessment and plan as outlined  Nutrition Status:      Skin Assessment: Right lower extremity erythema edema, with a second toe necrotic wound:   ------------------------------------------------------------------------------------------------  DVT prophylaxis:  enoxaparin  (LOVENOX ) injection 40 mg Start: 12/02/23 2200 SCDs Start: 12/02/23 1855 SCDs Start: 12/02/23 0507   Code Status:   Code Status: Full Code  Family Communication: No family member present at bedside-  -Advance care planning has been discussed.   Admission status:   Status is: Inpatient Needing IV antibiotics, imaging MRI of the foot, surgical evaluation   Disposition: From  - home             Planning for discharge in 2 days   Procedures:   No admission procedures for hospital encounter.   Antimicrobials:  Anti-infectives (From admission, onward)    Start     Dose/Rate Route Frequency Ordered Stop   12/04/23 1000  vancomycin  (VANCOREADY) IVPB 1750 mg/350 mL  Status:  Discontinued        1,750 mg 175 mL/hr over 120 Minutes Intravenous Every 48 hours 12/03/23 0927 12/03/23 1958   12/03/23 0030  cefTRIAXone  (ROCEPHIN ) 2 g in sodium chloride  0.9 % 100 mL IVPB        2 g 200 mL/hr over 30 Minutes Intravenous Every 24 hours 12/02/23 0438     12/02/23 1000  vancomycin  (VANCOREADY) IVPB 1250 mg/250 mL  Status:  Discontinued        1,250 mg 166.7 mL/hr over 90 Minutes Intravenous Every 48 hours 12/02/23 0625 12/03/23 0927   12/02/23 0215  cefTRIAXone  (ROCEPHIN ) 2 g in sodium chloride  0.9 % 100 mL IVPB        2 g 200 mL/hr over 30 Minutes Intravenous  Once 12/02/23 0209  12/02/23 0259   12/02/23 0215  vancomycin  (VANCOCIN ) IVPB 1000 mg/200 mL premix        1,000 mg 200 mL/hr over 60 Minutes Intravenous  Once 12/02/23 0209 12/02/23 0329        Medication:   amLODipine   10 mg Oral q morning   enoxaparin  (LOVENOX ) injection  40 mg Subcutaneous Q24H   insulin  aspart  0-9 Units Subcutaneous TID WC   metoprolol  tartrate  25 mg Oral BID   pantoprazole   40 mg Oral Daily    acetaminophen  **OR** acetaminophen , ondansetron  **OR** ondansetron  (ZOFRAN ) IV   Objective:   Vitals:   12/03/23 1308 12/03/23 2149 12/03/23 2151 12/04/23 0652  BP: 133/71 (!) 147/77 (!) 147/77 (!) 144/75  Pulse: 66 67 67 69  Resp: 17 16  18   Temp: 99.1 F (37.3 C) 98.3 F (36.8 C)  98.4 F (36.9 C)  TempSrc: Oral Oral  Oral  SpO2:  98% 99%  99%  Weight:      Height:        Intake/Output Summary (Last 24 hours) at 12/04/2023 1050 Last data filed at 12/03/2023 1500 Gross per 24 hour  Intake 340 ml  Output --  Net 340 ml   Filed Weights   12/01/23 2149 12/02/23 0338  Weight: 86.2 kg 87.3 kg     Physical examination:       General:  AAO x 3,  cooperative, no distress;   HEENT:  Normocephalic, PERRL, otherwise with in Normal limits   Neuro:  CNII-XII intact. , normal motor and sensation, reflexes intact   Lungs:   Clear to auscultation BL, Respirations unlabored,  No wheezes / crackles  Cardio:    S1/S2, RRR, No murmure, No Rubs or Gallops   Abdomen:  Soft, non-tender, bowel sounds active all four quadrants, no guarding or peritoneal signs.  Muscular  skeletal:  Improving right lower extremity edema, necrotic second toe Limited exam -global generalized weaknesses - in bed, able to move all 4 extremities,   2+ pulses,  symmetric, No pitting edema  Skin:  Dry, warm to touch, improved right lower extremity edema erythema, right lower second toe necrotic at the tip   Wounds: Right lower extremity with erythema, edema, second toe necrotic type    Media  Information  Document Information  Photos    12/01/2023 23:38  Attached To:  Hospital Encounter on 12/01/23  Source Information    ------------------------------------------------------------------------------------------------------------------------------------------    LABs:     Latest Ref Rng & Units 12/04/2023    3:53 AM 12/03/2023    5:17 AM 12/02/2023    5:29 AM  CBC  WBC 4.0 - 10.5 K/uL 6.9  6.5  7.0   Hemoglobin 13.0 - 17.0 g/dL 86.6  86.9  86.9   Hematocrit 39.0 - 52.0 % 38.5  38.4  38.7   Platelets 150 - 400 K/uL 183  178  181       Latest Ref Rng & Units 12/04/2023    3:53 AM 12/03/2023    5:17 AM 12/02/2023    5:29 AM  CMP  Glucose 70 - 99 mg/dL 894  889  761   BUN 8 - 23 mg/dL 23  21  21    Creatinine 0.61 - 1.24 mg/dL 7.82  7.73  7.92   Sodium 135 - 145 mmol/L 142  141  141   Potassium 3.5 - 5.1 mmol/L 4.1  4.0  4.0   Chloride 98 - 111 mmol/L 102  106  105   CO2 22 - 32 mmol/L 27  27  28    Calcium  8.9 - 10.3 mg/dL 8.7  8.6  8.5   Total Protein 6.5 - 8.1 g/dL   6.5   Total Bilirubin 0.0 - 1.2 mg/dL   0.6   Alkaline Phos 38 - 126 U/L   61   AST 15 - 41 U/L   12   ALT 0 - 44 U/L   11        Micro Results No results found for this or any previous visit (from the past 240 hours).  Radiology Reports US  ARTERIAL ABI (SCREENING LOWER EXTREMITY) Result Date: 12/04/2023 CLINICAL DATA:  Osteomyelitis of second toe EXAM: NONINVASIVE PHYSIOLOGIC VASCULAR STUDY OF BILATERAL LOWER EXTREMITIES TECHNIQUE: Evaluation of both lower extremities were performed at rest, including calculation of ankle-brachial indices with single level Doppler, pressure and pulse volume recording. COMPARISON:  None Available. FINDINGS: Right ABI:  0.9 Left  ABI:  1.0 Right Lower Extremity: The posterior tibial index is borderline abnormal. The dorsalis pedis index and waveforms are degraded. Left Lower Extremity: The posterior tibial index is within normal limits. The dorsalis pedis index is  borderline abnormal and degraded. 0.9-0.99 Borderline PAD IMPRESSION: 1. Right lower extremity: Borderline abnormal right-sided ABI. Degraded waveform in the anterior tibial territory which suggests moderate disease in that territory. 2. Left lower extremity: ABI is within normal limits. Mild peripheral arterial disease is suspected based on waveform analysis. Electronically Signed   By: Maude Naegeli M.D.   On: 12/04/2023 06:13    SIGNED: Adriana DELENA Grams, MD, FHM. FAAFP. Jolynn Pack - [facility] Time spent - 55 min.  In seeing, evaluating and examining the patient. Reviewing medical records, labs, drawn plan of care. [facility],  Pager (please use [email or web address] to page/ text) Please use Epic Secure Chat for non-urgent communication (7AM-7PM)  If 7PM-7AM, please contact night-coverage [email or web address], 12/04/2023, 10:50 AM

## 2023-12-04 NOTE — Progress Notes (Addendum)
 I was present with the medical student for this service. I personally verified the history of present illness, performed the physical exam, and made the plan for this encounter. I have verified the medical student's documentation and made modifications where appropriately. I have personally documented in my own words a brief history, physical, and plan below.     ABI with R sided concern for moderated disease. I called and talked to [facility] who does think the patient needs to get an angiogram due to issues with healing in diabetics. He will need to be transferred to [facility] to evaluate for angiogram, and he can get his amputation down at [facility] too to limit transfer needs.   I have called and updated the patient. I appreciate everyone's assistance. I have updated Dr. Twana and the team.   Manuelita Pander, MD [facility] [address] [place], [place] [address] [phone] (office)      Subjective: No acute events overnight. Patient's swelling has improved and still does not report any pain. Patient only complaining of some neuropathy of his big toe that may be due to his diabetes.   Objective: Vital signs in last 24 hours: Temp:  [98.3 F (36.8 C)-99.1 F (37.3 C)] 98.4 F (36.9 C) (09/23 0652) Pulse Rate:  [66-69] 69 (09/23 0652) Resp:  [16-18] 18 (09/23 0652) BP: (133-147)/(71-77) 144/75 (09/23 0652) SpO2:  [98 %-99 %] 99 % (09/23 0652) Last BM Date : 12/03/23  Intake/Output from previous day: 09/22 0701 - 09/23 0700 In: 580 [P.O.:480; IV Piggyback:100] Out: -  Intake/Output this shift: No intake/output data recorded.   Physical Exam Constitutional:      Appearance: Normal appearance.  Eyes:     Extraocular Movements: Extraocular movements intact.     Pupils: Pupils are equal, round, and reactive to light.  Cardiovascular:     Rate and Rhythm: Normal rate and regular rhythm.     Pulses:          Dorsalis pedis  pulses are 1+ on the right side and 1+ on the left side.  Pulmonary:     Effort: Pulmonary effort is normal.     Breath sounds: Normal breath sounds.  Abdominal:     General: Abdomen is flat.     Palpations: Abdomen is soft.  Feet:     Right foot:     Skin integrity: Ulcer present.  Neurological:     Mental Status: He is alert.      Lab Results:  Recent Labs    12/03/23 0517 12/04/23 0353  WBC 6.5 6.9  HGB 13.0 13.3  HCT 38.4* 38.5*  PLT 178 183   BMET Recent Labs    12/03/23 0517 12/04/23 0353  NA 141 142  K 4.0 4.1  CL 106 102  CO2 27 27  GLUCOSE 110* 105*  BUN 21 23  CREATININE 2.26* 2.17*  CALCIUM  8.6* 8.7*   PT/INR No results for input(s): LABPROT, INR in the last 72 hours.  Studies/Results: US  ARTERIAL ABI (SCREENING LOWER EXTREMITY) Result Date: 12/04/2023 CLINICAL DATA:  Osteomyelitis of second toe EXAM: NONINVASIVE PHYSIOLOGIC VASCULAR STUDY OF BILATERAL LOWER EXTREMITIES TECHNIQUE: Evaluation of both lower extremities were performed at rest, including calculation of ankle-brachial indices with single level Doppler, pressure and pulse volume recording. COMPARISON:  None Available. FINDINGS: Right ABI:  0.9 Left ABI:  1.0 Right Lower Extremity: The posterior tibial index is borderline abnormal. The dorsalis pedis index and waveforms are degraded. Left Lower Extremity:  The posterior tibial index is within normal limits. The dorsalis pedis index is borderline abnormal and degraded. 0.9-0.99 Borderline PAD IMPRESSION: 1. Right lower extremity: Borderline abnormal right-sided ABI. Degraded waveform in the anterior tibial territory which suggests moderate disease in that territory. 2. Left lower extremity: ABI is within normal limits. Mild peripheral arterial disease is suspected based on waveform analysis. Electronically Signed   By: Maude Naegeli M.D.   On: 12/04/2023 06:13   MR FOOT RIGHT WO CONTRAST Result Date: 12/03/2023 MR FOOT WITHOUT IV CONTRAST RIGHT  COMPARISON: None. CLINICAL HISTORY: Second toe swelling. PULSE SEQUENCES: Ax T1, Ax T2 FS, Sag T1, Sag T2 FS, Cor STIR, Cor T1 without contrast. FINDINGS: There is a wound at the tip of the second toe. There is destruction of the distal phalanx of the second toe consistent with acute osteomyelitis. There is mild edema in the second middle phalanx suggesting involvement. There is no interphalangeal effusion present. No obvious involvement of the proximal phalanx. Otherwise, the bones of the forefoot demonstrate mild degenerative change without marrow edema. Mild degenerative changes in the midfoot. Moderate subcutaneous edema is identified in the dorsum of the foot and in the second toe. There is no significant tenosynovitis or tendinosis. No drainable collection. IMPRESSION: Osteomyelitis of the second toe as above. There is a wound at the tip of the second toe which communicates with the bone. No obvious involvement of the proximal phalanx. Moderate dorsal subcutaneous edema without drainable collection. Electronically signed by: Norleen Satchel MD 12/03/2023 10:40 AM EDT RP Workstation: [id number]   US  Venous Img Lower Unilateral Right (DVT) Result Date: 12/02/2023 CLINICAL DATA:  83 year old male with right lower extremity swelling. EXAM: RIGHT LOWER EXTREMITY VENOUS DOPPLER ULTRASOUND TECHNIQUE: Gray-scale sonography with graded compression, as well as color Doppler and duplex ultrasound were performed to evaluate the right lower extremity deep venous systems from the level of the common femoral vein and including the common femoral, femoral, profunda femoral, popliteal and calf veins including the posterior tibial, peroneal and gastrocnemius veins when visible. Spectral Doppler was utilized to evaluate flow at rest and with distal augmentation maneuvers in the common femoral, femoral and popliteal veins. The contralateral common femoral vein was also evaluated for comparison. COMPARISON:  None Available.  FINDINGS: RIGHT LOWER EXTREMITY Common Femoral Vein: No evidence of thrombus. Normal compressibility, respiratory phasicity and response to augmentation. Central Greater Saphenous Vein: No evidence of thrombus. Normal compressibility and flow on color Doppler imaging. Central Profunda Femoral Vein: No evidence of thrombus. Normal compressibility and flow on color Doppler imaging. Femoral Vein: There is nonocclusive, non expansile, slightly hyperechoic and wall adherent thrombus visualized in the mid femoral vein. There is occlusive, mildly expansile thrombus visualized in the peripheral aspect of the femoral vein. Popliteal Vein: There is nonocclusive, non expansile, slightly hyperechoic wall adherent thrombus visualized. Calf Veins: No evidence of thrombus. Normal compressibility and flow on color Doppler imaging. Venous Reflux:  None. Other Findings:  None. LEFT LOWER EXTREMITY Common Femoral Vein: No evidence of thrombus. Normal compressibility, respiratory phasicity and response to augmentation. IMPRESSION: Deep vein thrombosis limited to the right popliteal and femoral vein which appears mostly chronic however there is a short segment of acute appearing thrombus in the peripheral femoral vein. No evidence of iliocaval extension. Ester Sides, MD [facility] Electronically Signed   By: Ester Sides M.D.   On: 12/02/2023 21:47    Anti-infectives: Anti-infectives (From admission, onward)    Start  Dose/Rate Route Frequency Ordered Stop   12/04/23 1000  vancomycin  (VANCOREADY) IVPB 1750 mg/350 mL  Status:  Discontinued        1,750 mg 175 mL/hr over 120 Minutes Intravenous Every 48 hours 12/03/23 0927 12/03/23 1958   12/03/23 0030  cefTRIAXone  (ROCEPHIN ) 2 g in sodium chloride  0.9 % 100 mL IVPB        2 g 200 mL/hr over 30 Minutes Intravenous Every 24 hours 12/02/23 0438     12/02/23 1000  vancomycin  (VANCOREADY) IVPB 1250 mg/250 mL   Status:  Discontinued        1,250 mg 166.7 mL/hr over 90 Minutes Intravenous Every 48 hours 12/02/23 0625 12/03/23 0927   12/02/23 0215  cefTRIAXone  (ROCEPHIN ) 2 g in sodium chloride  0.9 % 100 mL IVPB        2 g 200 mL/hr over 30 Minutes Intravenous  Once 12/02/23 0209 12/02/23 0259   12/02/23 0215  vancomycin  (VANCOCIN ) IVPB 1000 mg/200 mL premix        1,000 mg 200 mL/hr over 60 Minutes Intravenous  Once 12/02/23 0209 12/02/23 0329       Assessment/Plan: Osteomyelitis of right second toe distal/middle phalynx will require amputation to prevent contiguous spread. Swelling has mildly improved from yesterday and ABI of 0.9 of right leg is suggestive of borderline PAD.  -- Consulted vascular surgery for microvascular flow to determine if endovascular revascularization is possible  - Continue ceftriaxone  for osteomyelitis. -Transfer   LOS: 2 days    Ommar [facility]uis [facility] 12/04/2023

## 2023-12-04 NOTE — Consult Note (Signed)
 [facility] [place]  Patient name: Hector Rose MRN: [id number] DOB: 01-13-1941 Sex: male   REQUESTING PROVIDER:   Dr. Kallie   REASON FOR CONSULT:    Right second toe wound  HISTORY OF PRESENT ILLNESS:   Hector Rose is a 83 y.o. male, who presented to the [facility] emergency department with a 1 week history of a right second toe wound.  He feels that this happened when his shoe rubbed against it.  He has been treated with antibiotics for approximately 2 days without improvement.  He is also complaining of right leg swelling.  Patient has a history of type 2 diabetes.  He also has a history of DVT.  He has been on Xarelto .  He is a non-smoker.  He is medically managed for hypertension.  PAST MEDICAL HISTORY    Past Medical History:  Diagnosis Date   Asthma 04-11-11   AS CHILD ONLY   BPH (benign prostatic hypertrophy) with urinary obstruction    Carpal tunnel syndrome    patient denies at 05/14/17 preop visit    Difficult intubation 04-11-11   08-11-97 some issues with intubation/record with chart   DJD (degenerative joint disease)    DM (diabetes mellitus) (HCC) 04-11-11   tyep II    DVT femoral (deep venous thrombosis) with thrombophlebitis (HCC) 04-11-11   ?'09-tx. on Xarelton x 3-4 years    GERD (gastroesophageal reflux disease) 04-11-11   mild, no med in 1 month   Gout 04-11-11    ankle 2 yrs ago   Hemorrhoids    Hypercholesteremia    Hypertension    Internal hemorrhoids    Keloid 04-11-11   multiple-arms,back, chest   Pulmonary nodule 04-11-11   PT. NOT AWARE -denies problems with breathing   Renal calculus    Thyroid  disease    hypothyroid     FAMILY HISTORY   History reviewed. No pertinent family history.  SOCIAL HISTORY:   Social History   Socioeconomic History   Marital status: Married    Spouse name: Not on file   Number of children: 2   Years of education: Not on file   Highest education  level: Not on file  Occupational History   Occupation: retired from [employer]  Tobacco Use   Smoking status: Never   Smokeless tobacco: Never  Vaping Use   Vaping status: Never Used  Substance and Sexual Activity   Alcohol use: No    Alcohol/week: 0.0 standard drinks of alcohol   Drug use: No   Sexual activity: Yes  Other Topics Concern   Not on file  Social History Narrative   Right handed    Retired    [employer] Strain: Not on file  Food Insecurity: No Food Insecurity (12/02/2023)   Hunger Vital Sign    Worried About Running Out of Food in the Last Year: Never true    Ran Out of Food in the Last Year: Never true  Transportation Needs: No Transportation Needs (12/02/2023)   PRAPARE - Administrator, Civil Service (Medical): No    Lack of Transportation (Non-Medical): No  Physical Activity: Not on file  Stress: Not on file  Social Connections: Socially Integrated (12/02/2023)   Social Connection and Isolation Panel    Frequency of Communication with Friends and Family: More than three times a week    Frequency of Social Gatherings with Friends and Family: Three times a week  Attends Religious Services: More than 4 times per year    Active Member of Clubs or Organizations: Yes    Attends Banker Meetings: More than 4 times per year    Marital Status: Married  Catering manager Violence: Not At Risk (12/02/2023)   Humiliation, Afraid, Rape, and Kick questionnaire    Fear of Current or Ex-Partner: No    Emotionally Abused: No    Physically Abused: No    Sexually Abused: No    ALLERGIES:    Allergies  Allergen Reactions   Ace Inhibitors Swelling    Angio edema (swelling of lips) Swelling around face Swelling in mouth   Allopurinol  Itching    Makes feet fill numb and tingle Pt stated he is not allergic to this 12-02-2023   Metformin  And Related Itching    Itching in higher doses    CURRENT MEDICATIONS:     Current Facility-Administered Medications  Medication Dose Route Frequency Provider Last Rate Last Admin   acetaminophen  (TYLENOL ) tablet 650 mg  650 mg Oral Q6H PRN Shahmehdi, Seyed A, MD       Or   acetaminophen  (TYLENOL ) suppository 650 mg  650 mg Rectal Q6H PRN Shahmehdi, Seyed A, MD       amLODipine  (NORVASC ) tablet 10 mg  10 mg Oral q morning Shahmehdi, Seyed A, MD   10 mg at 12/04/23 0809   cefTRIAXone  (ROCEPHIN ) 2 g in sodium chloride  0.9 % 100 mL IVPB  2 g Intravenous Q24H Shahmehdi, Seyed A, MD 200 mL/hr at 12/04/23 0056 2 g at 12/04/23 0056   heparin  ADULT infusion 100 units/mL (25000 units/250mL)  1,050 Units/hr Intravenous Continuous Tanda Dempsey SAUNDERS, RPH 10.5 mL/hr at 12/04/23 1146 1,050 Units/hr at 12/04/23 1146   insulin  aspart (novoLOG ) injection 0-9 Units  0-9 Units Subcutaneous TID WC Willette Jest A, MD   2 Units at 12/04/23 1207   metoprolol  tartrate (LOPRESSOR ) tablet 25 mg  25 mg Oral BID Willette Jest A, MD   25 mg at 12/04/23 2100   ondansetron  (ZOFRAN ) tablet 4 mg  4 mg Oral Q6H PRN Shahmehdi, Jest LABOR, MD       Or   ondansetron  (ZOFRAN ) injection 4 mg  4 mg Intravenous Q6H PRN Shahmehdi, Seyed A, MD       pantoprazole  (PROTONIX ) EC tablet 40 mg  40 mg Oral Daily Shahmehdi, Seyed A, MD   40 mg at 12/04/23 0808    REVIEW OF SYSTEMS:   [X]  denotes positive finding, [ ]  denotes negative finding Cardiac  Comments:  Chest pain or chest pressure:    Shortness of breath upon exertion:    Short of breath when lying flat:    Irregular heart rhythm:        Vascular    Pain in calf, thigh, or hip brought on by ambulation:    Pain in feet at night that wakes you up from your sleep:     Blood clot in your veins:    Leg swelling:         Pulmonary    Oxygen at home:    Productive cough:     Wheezing:         Neurologic    Sudden weakness in arms or legs:     Sudden numbness in arms or legs:     Sudden onset of difficulty speaking or slurred speech:     Temporary loss of vision in one eye:     Problems with dizziness:  Gastrointestinal    Blood in stool:      Vomited blood:         Genitourinary    Burning when urinating:     Blood in urine:        Psychiatric    Major depression:         Hematologic    Bleeding problems:    Problems with blood clotting too easily:        Skin    Rashes or ulcers:        Constitutional    Fever or chills:     PHYSICAL EXAM:   Vitals:   12/04/23 0652 12/04/23 1242 12/04/23 1705 12/04/23 1950  BP: (!) 144/75 (!) 141/69 (!) 152/65 (!) 141/68  Pulse: 69 71 69 71  Resp: 18 17 16 17   Temp: 98.4 F (36.9 C) 98.4 F (36.9 C) 98.3 F (36.8 C) 98.4 F (36.9 C)  TempSrc: Oral Oral Oral Oral  SpO2: 99% 100% 100% 100%  Weight:      Height:        GENERAL: The patient is a well-nourished male, in no acute distress. The vital signs are documented above. CARDIAC: There is a regular rate and rhythm.  VASCULAR: Palpable bilateral femoral and popliteal pulses.  Nonpalpable right pedal pulse with edema PULMONARY: Nonlabored respirations ABDOMEN: Soft and non-tender with normal pitched bowel sounds.  MUSCULOSKELETAL: There are no major deformities or cyanosis. NEUROLOGIC: No focal weakness or paresthesias are detected. SKIN: There are no ulcers or rashes noted. PSYCHIATRIC: The patient has a normal affect.  STUDIES:   I have reviewed the following: Ultrasound: Right ABI:  0.9   Left ABI:  1.0   Right Lower Extremity: The posterior tibial index is borderline abnormal. The dorsalis pedis index and waveforms are degraded.   Left Lower Extremity: The posterior tibial index is within normal limits. The dorsalis pedis index is borderline abnormal and degraded.   0.9-0.99 Borderline PAD   IMPRESSION: 1. Right lower extremity: Borderline abnormal right-sided ABI. Degraded waveform in the anterior tibial territory which suggests moderate disease in that territory. 2. Left lower  extremity: ABI is within normal limits. Mild peripheral arterial disease is suspected based on waveform analysis.   MRI: Osteomyelitis of the second toe as above. There is a wound at the tip of the second toe which communicates with the bone. No obvious involvement of the proximal phalanx.   Moderate dorsal subcutaneous edema without drainable collection.  Venous Deep vein thrombosis limited to the right popliteal and femoral vein which appears mostly chronic however there is a short segment of acute appearing thrombus in the peripheral femoral vein. No evidence of iliocaval extension.  ASSESSMENT and PLAN   Right second toe ulcer: I had the patient transferred to [facility] for vascular evaluation.  He is a diabetic.  He has palpable popliteal pulses but nonpalpable pedal pulses.  I have recommended angiography prior to amputation.  This will be from a left femoral approach with intervention on the right leg if indicated.  He will be n.p.o. after midnight.  He is currently on heparin  for his DVT which can be discontinued on call for his angiogram.  Details of the procedure were discussed with the patient.  All questions were answered and he wishes to proceed.  Please contact orthopedics or podiatry for digital amputation   Malvina Serene CLORE, MD, FACS [facility] [phone] Pager [phone]

## 2023-12-04 NOTE — Plan of Care (Signed)

## 2023-12-05 ENCOUNTER — Encounter (HOSPITAL_COMMUNITY): Payer: Self-pay | Admitting: Vascular Surgery

## 2023-12-05 ENCOUNTER — Other Ambulatory Visit (HOSPITAL_COMMUNITY)

## 2023-12-05 ENCOUNTER — Encounter (HOSPITAL_COMMUNITY)
Admission: EM | Disposition: A | Discharge status: Home Health | Payer: Self-pay | Source: Home / Self Care | Attending: Family Medicine

## 2023-12-05 DIAGNOSIS — L97519 Non-pressure chronic ulcer of other part of right foot with unspecified severity: Secondary | ICD-10-CM

## 2023-12-05 DIAGNOSIS — I70235 Atherosclerosis of native arteries of right leg with ulceration of other part of foot: Secondary | ICD-10-CM

## 2023-12-05 DIAGNOSIS — M869 Osteomyelitis, unspecified: Secondary | ICD-10-CM | POA: Diagnosis not present

## 2023-12-05 DIAGNOSIS — M86171 Other acute osteomyelitis, right ankle and foot: Secondary | ICD-10-CM

## 2023-12-05 HISTORY — PX: ABDOMINAL AORTOGRAM W/LOWER EXTREMITY: CATH118223

## 2023-12-05 HISTORY — PX: LOWER EXTREMITY INTERVENTION: CATH118252

## 2023-12-05 LAB — BASIC METABOLIC PANEL WITH GFR
Anion gap: 12 (ref 5–15)
BUN: 20 mg/dL (ref 8–23)
CO2: 21 mmol/L — ABNORMAL LOW (ref 22–32)
Calcium: 9.1 mg/dL (ref 8.9–10.3)
Chloride: 106 mmol/L (ref 98–111)
Creatinine, Ser: 2.01 mg/dL — ABNORMAL HIGH (ref 0.61–1.24)
GFR, Estimated: 33 mL/min — ABNORMAL LOW (ref >60)
Glucose, Bld: 93 mg/dL (ref 70–99)
Potassium: 4.2 mmol/L (ref 3.5–5.1)
Sodium: 139 mmol/L (ref 135–145)

## 2023-12-05 LAB — GLUCOSE, CAPILLARY
Glucose-Capillary: 69 mg/dL — ABNORMAL LOW (ref 70–99)
Glucose-Capillary: 91 mg/dL (ref 70–99)
Glucose-Capillary: 100 mg/dL — ABNORMAL HIGH (ref 70–99)
Glucose-Capillary: 113 mg/dL — ABNORMAL HIGH (ref 70–99)

## 2023-12-05 LAB — CBC
HCT: 42.8 % (ref 39.0–52.0)
Hemoglobin: 14.7 g/dL (ref 13.0–17.0)
MCH: 30.3 pg (ref 26.0–34.0)
MCHC: 34.3 g/dL (ref 30.0–36.0)
MCV: 88.2 fL (ref 80.0–100.0)
Platelets: 206 K/uL (ref 150–400)
RBC: 4.85 MIL/uL (ref 4.22–5.81)
RDW: 12 % (ref 11.5–15.5)
WBC: 7.5 K/uL (ref 4.0–10.5)
nRBC: 0 % (ref 0.0–0.2)

## 2023-12-05 LAB — HEPARIN LEVEL (UNFRACTIONATED): Heparin Unfractionated: 0.79 [IU]/mL — ABNORMAL HIGH (ref 0.30–0.70)

## 2023-12-05 LAB — APTT: aPTT: 94 s — ABNORMAL HIGH (ref 24–36)

## 2023-12-05 LAB — SURGICAL PCR SCREEN
MRSA, PCR: NEGATIVE
Staphylococcus aureus: NEGATIVE

## 2023-12-05 SURGERY — AMPUTATION, TOE
Anesthesia: General | Site: Toe | Laterality: Right

## 2023-12-05 MED ORDER — CLOPIDOGREL BISULFATE 75 MG PO TABS
75.0000 mg | ORAL_TABLET | Freq: Every day | ORAL | Status: DC
  Administered 2023-12-06: 75 mg via ORAL
  Filled 2023-12-05: qty 1

## 2023-12-05 MED ORDER — ACETAMINOPHEN 325 MG PO TABS: 650.0000 mg | ORAL_TABLET | ORAL | Status: DC | PRN

## 2023-12-05 MED ORDER — SODIUM CHLORIDE 0.9 % WEIGHT BASED INFUSION: 1.0000 mL/kg/h | INTRAVENOUS | Status: AC

## 2023-12-05 MED ORDER — LIDOCAINE HCL (PF) 1 % IJ SOLN
INTRAMUSCULAR | Status: AC
Start: 2023-12-05 — End: 2023-12-05
  Filled 2023-12-05: qty 30

## 2023-12-05 MED ORDER — SODIUM CHLORIDE 0.9% FLUSH: 3.0000 mL | INTRAVENOUS | Status: DC | PRN

## 2023-12-05 MED ORDER — HEPARIN SODIUM (PORCINE) 1000 UNIT/ML IJ SOLN
INTRAMUSCULAR | Status: AC
  Filled 2023-12-05: qty 10

## 2023-12-05 MED ORDER — LIDOCAINE HCL (PF) 1 % IJ SOLN
INTRAMUSCULAR | Status: DC | PRN
  Administered 2023-12-05: 10 mL via INTRADERMAL

## 2023-12-05 MED ORDER — ATORVASTATIN CALCIUM 80 MG PO TABS
80.0000 mg | ORAL_TABLET | Freq: Every day | ORAL | Status: DC
  Administered 2023-12-06: 80 mg via ORAL
  Filled 2023-12-05: qty 1

## 2023-12-05 MED ORDER — SODIUM CHLORIDE 0.9% FLUSH: 3.0000 mL | Freq: Two times a day (BID) | INTRAVENOUS | Status: DC

## 2023-12-05 MED ORDER — ASPIRIN 81 MG PO CHEW
CHEWABLE_TABLET | ORAL | Status: AC
  Filled 2023-12-05: qty 1

## 2023-12-05 MED ORDER — HEPARIN (PORCINE) 25000 UT/250ML-% IV SOLN
1050.0000 [IU]/h | INTRAVENOUS | Status: DC
  Administered 2023-12-05: 1050 [IU]/h via INTRAVENOUS
  Filled 2023-12-05: qty 250

## 2023-12-05 MED ORDER — MUPIROCIN 2 % EX OINT
1.0000 | TOPICAL_OINTMENT | Freq: Two times a day (BID) | CUTANEOUS | Status: DC
  Administered 2023-12-05: 1 via NASAL
  Filled 2023-12-05: qty 22

## 2023-12-05 MED ORDER — SODIUM CHLORIDE 0.9 % IV SOLN: 250.0000 mL | INTRAVENOUS | Status: DC | PRN

## 2023-12-05 MED ORDER — CLOPIDOGREL BISULFATE 300 MG PO TABS
ORAL_TABLET | ORAL | Status: DC | PRN
  Administered 2023-12-05: 300 mg via ORAL

## 2023-12-05 MED ORDER — LABETALOL HCL 5 MG/ML IV SOLN: 10.0000 mg | INTRAVENOUS | Status: DC | PRN

## 2023-12-05 MED ORDER — CLOPIDOGREL BISULFATE 300 MG PO TABS
ORAL_TABLET | ORAL | Status: AC
  Filled 2023-12-05: qty 1

## 2023-12-05 MED ORDER — ASPIRIN 81 MG PO CHEW
CHEWABLE_TABLET | ORAL | Status: DC | PRN
  Administered 2023-12-05: 81 mg via ORAL

## 2023-12-05 MED ORDER — HEPARIN SODIUM (PORCINE) 1000 UNIT/ML IJ SOLN
INTRAMUSCULAR | Status: DC | PRN
  Administered 2023-12-05: 2000 [IU] via INTRAVENOUS
  Administered 2023-12-05: 8000 [IU] via INTRAVENOUS

## 2023-12-05 MED ORDER — HYDRALAZINE HCL 20 MG/ML IJ SOLN: 5.0000 mg | INTRAMUSCULAR | Status: DC | PRN

## 2023-12-05 MED ORDER — FENTANYL CITRATE (PF) 100 MCG/2ML IJ SOLN
INTRAMUSCULAR | Status: DC | PRN
  Administered 2023-12-05: 50 ug via INTRAVENOUS

## 2023-12-05 MED ORDER — SODIUM CHLORIDE 0.9 % IV SOLN: INTRAVENOUS | Status: DC

## 2023-12-05 MED ORDER — MIDAZOLAM HCL 2 MG/2ML IJ SOLN
INTRAMUSCULAR | Status: AC
  Filled 2023-12-05: qty 2

## 2023-12-05 MED ORDER — HEPARIN SODIUM (PORCINE) 5000 UNIT/ML IJ SOLN: 5000.0000 [IU] | Freq: Three times a day (TID) | INTRAMUSCULAR | Status: DC

## 2023-12-05 MED ORDER — MIDAZOLAM HCL 2 MG/2ML IJ SOLN
INTRAMUSCULAR | Status: DC | PRN
  Administered 2023-12-05: 1 mg via INTRAVENOUS

## 2023-12-05 MED ORDER — FENTANYL CITRATE (PF) 100 MCG/2ML IJ SOLN
INTRAMUSCULAR | Status: AC
  Filled 2023-12-05: qty 2

## 2023-12-05 MED ORDER — IODIXANOL 320 MG/ML IV SOLN
INTRAVENOUS | Status: DC | PRN
  Administered 2023-12-05: 65 mL via INTRA_ARTERIAL

## 2023-12-05 MED ORDER — HEPARIN (PORCINE) IN NACL 1000-0.9 UT/500ML-% IV SOLN
INTRAVENOUS | Status: DC | PRN
  Administered 2023-12-05: 1000 mL

## 2023-12-05 NOTE — Progress Notes (Signed)
 Successful recanalization of the posterior tibial artery, balloon angioplasty of the peroneal artery.  Patient now with two-vessel runoff to the foot.  He has been maximally revascularized.  Can restart heparin  this evening, no bolus. Medications moving forward will be anticoagulation, ASA, high intensity statin  Hector Rose E Alitza Cowman

## 2023-12-05 NOTE — Op Note (Signed)
 Patient name: Hector Rose MRN: [id number] DOB: 07-13-1940 Sex: male  12/05/2023 Pre-operative Diagnosis: Right lower extremity critical limb ischemia with tissue loss at the toes Post-operative diagnosis:  Same Surgeon:  Fonda FORBES Rim, MD Procedure Performed: 1.  Ultrasound-guided micropuncture access of the left common femoral artery in retrograde fashion 2.  Aortogram 3.  Secondary cannulation, right lower extremity angiogram 4.  Third order cannulation, right lower extremity angiogram 5.  Selective angiography of the popliteal artery 6.  Balloon angioplasty 3 x 150 mm peroneal artery 7.  Balloon angioplasty 2.5 x 220 mm posterior tibial artery 8.  Device assisted closure-Mynx Sedation time 64 minutes, contrast volume 65 mL   Indications: Patient is an 83 year old male with history of right sided osteomyelitis of the toe.  He had nonpalpable pulses on exam and depressed toe pressures on ABI in 2022.  After discussing risks and benefits of right lower extremity angiogram in an effort to define improve distal perfusion for wound healing, Jonette elected to proceed.  Findings:  Aortogram: No flow-limiting stenosis appreciated in the aortoiliac segments bilaterally.  Bilateral renal arteries patent.  On the right: Widely patent common femoral artery, profunda, superficial femoral artery, popliteal artery Significant small vessel disease below the level of the knee with patent tibioperoneal trunk.  The anterior tibial artery is occluded the posterior tibial artery is atretic and occludes after roughly 5 cm.  The peroneal artery provides runoff to the foot and has an 8 cm tapering stenosis greater than 95%.  Medial lateral perforators fill the foot and retrograde fills the posterior tibial artery.   Procedure:  The patient was identified in the holding area and taken to room 8.  The patient was then placed supine on the table and prepped and draped in the usual sterile fashion.  A time out  was called.  Ultrasound was used to evaluate the left common femoral artery.  It was patent .  A digital ultrasound image was acquired.  A micropuncture needle was used to access the left common femoral artery under ultrasound guidance.  An 018 wire was advanced without resistance and a micropuncture sheath was placed.  The 018 wire was removed and a benson wire was placed.  The micropuncture sheath was exchanged for a 5 french sheath.  An omniflush catheter was advanced over the wire to the level of L-1.  An abdominal angiogram was obtained.  Next, using the omniflush catheter and a benson wire, the aortic bifurcation was crossed and the catheter was placed into theright external iliac artery and right runoff was obtained.    I elected to attempt intervention on the peroneal artery, and elected to attempt recanalization of the posterior tibial artery as the artery at the level of the ankle provided significant outflow to the foot.  The patient was heparinized and a 6 x 60 cm sheath was brought to the field and parked in the mid superficial femoral artery.  Next, a series of wires and catheters were used to navigate across the peroneal artery lesion.  A 3 x 150 mm balloon was brought into the field and the lesion balloon angioplastied.  Follow-up angiography demonstrated an excellent result with resolution of flow-limiting stenosis.  There was a small intimal defect, however there was no flow-limiting stenosis.  Next, my attention turned to the posterior tibial artery.  A 0.014 wire was used, and with significant effort, I was able to steer the wire through the posterior tibial artery occlusion, into the distal patent  posterior tibial artery.  Once across, a 2 x 220 mm balloon was brought onto the field and the entirety of the artery balloon angioplasty to.  This was followed by 2.5 x 220 mm balloon.  Follow-up angiography demonstrated an excellent result with recanalization of the native artery.  There was brisk,  two-vessel runoff to the foot with the dorsalis pedis artery appreciated, and distal portion of the anterior tibial artery filling retrograde.  Impression: Successful balloon angioplasty of the peroneal artery.  Successful recanalization of the posterior tibial artery.  Patient has been maximally revascularized.     Fonda FORBES Rim MD [facility] [place] Office: [phone]

## 2023-12-05 NOTE — Progress Notes (Signed)
 CBG 69. Asymptomatic. Drinking apple juice and will recheck.

## 2023-12-05 NOTE — Plan of Care (Signed)

## 2023-12-05 NOTE — Plan of Care (Signed)
   Problem: Clinical Measurements: Goal: Ability to maintain clinical measurements within normal limits will improve Outcome: Progressing

## 2023-12-05 NOTE — Progress Notes (Signed)
 PROGRESS NOTE    Patient: Hector Rose                            PCP: Marvine Rush, MD                    DOB: [birth year]            DOA: 12/01/2023 FMW:[id number]             DOS: 12/05/2023, 1:15 PM   LOS: 3 days   Date of Service: The patient was seen and examined on 12/05/2023  Subjective:   No issues overnight Hemodynamically stable Vascular team following, n.p.o.  Patient seen and examined with assist of APP Isaiah Lever  Brief Narrative:    Hector Rose is a 83 y.o. male with medical history significant of T2DM, hypertension, gout, GERD, DVT on Xarelto  who presents to the emergency department due to 1 week onset of swelling of right foot and right second toe wound.  He states that he started noticing difficulty in being able to put on his right shoe and that his shoe has been rubbing against the second toe within the same timeframe.  He went to an urgent care about 2 days ago and was prescribed with cephalexin, he returned to the urgent care today due to no improvement in leg swelling and toe wound and his dose was increased to 500 mg twice daily (he has not started to take this), but decided to go to the ED for further evaluation.  He denies fever, chest pain, shortness of breath, chills, pain.   ED Course:  Hemodynamically stable.  Workup in ED showed normal CBC and BMP except for blood glucose of 232 and creatinine of 2.20 (baseline creatinine about 2.0-2.1).  Sed rate 67. Right second toe x-ray showed bone destruction at the tip of the right second toe distal phalanx compatible with osteomyelitis. Patient was treated with IV ceftriaxone  and vancomycin . Orthopedic surgeon on-call Dr. Sharl was consulted and there was no indication for any emergent orthopedic intervention and if amputation is necessary, patient can be referred to Dr. Harden as an outpatient per EDP.    Assessment & Plan:   Principal Problem:   Osteomyelitis of second toe of right foot (HCC) Active  Problems:   Essential hypertension   GERD (gastroesophageal reflux disease)   Type 2 diabetes mellitus with hyperglycemia (HCC)   CKD (chronic kidney disease), stage IV (HCC)   Swelling of right lower extremity    Osteomyelitis of second toe of right foot Right second toe x-ray was suggestive of osteomyelitis -MRI right foot: Osteomyelitis of the second toe. There is a wound at the tip of the second toe which communicates with the bone. No obvious involvement of the proximal phalanx.  ID: Consulted, switched Vanco/Rocephin --to Rocephin  -- Consulting general surgery Dr. Kallie -we discussed this case with vascular surgery Dr. Katheryn at [facility] on ABI + peripheral vascular disease-recommend further workup including angiography  ABI: Right lower extremity borderline abnormal,-anterior tibial territory suggestive moderate disease.  Left lower extremity-within normal limits  Hemodynamically stable-in vascular lab this a.m. for vascular studies, angiography Appreciate vascular team following closely  Addendum: Status post recanalization of posterior tibial artery, balloon angioplasty of the peroneal artery--this has maximize revascularization -Per recommendation when stable continue aspirin  and high intensity statin     Type 2 diabetes mellitus with hyperglycemia Hemoglobin A1c about 3 months ago was 7.8 Continue ISS and  hypoglycemia protocol   CKD stage IV Creatinine of 2.20 (baseline creatinine about 2.0-2.1) Renally adjust medications, avoid nephrotoxic agents/dehydration/hypotension Lab Results  Component Value Date   CREATININE 2.01 (H) 12/05/2023   CREATININE 2.17 (H) 12/04/2023   CREATININE 2.26 (H) 12/03/2023      Lower extremity DVT/right lower extremity edema Xarelto  will be temporarily on HOLD -at this time in anticipation for possible surgical intervention of patient's right second toe osteomyelitis - Heparin  drip for now-in anticipation of surgical amputation  right second toe with  US ;  RTlower extremities Deep vein thrombosis limited to the right popliteal and femoral vein which appears mostly chronic however there is a short segment of acute appearing thrombus in the peripheral femoral vein.    Essential hypertension Continue amlodipine , Lopressor    GERD Continue Protonix     ------------------------------------------------------------------------------------------------ Nutritional status:  The patient's BMI is: Body mass index is 24.71 kg/m. I agree with the assessment and plan as outlined  Nutrition Status:      Skin Assessment: Right lower extremity erythema edema, with a second toe necrotic wound:   ------------------------------------------------------------------------------------------------  DVT prophylaxis:  SCDs Start: 12/02/23 1855 SCDs Start: 12/02/23 0507   Code Status:   Code Status: Full Code  Family Communication: No family member present at bedside-  -Advance care planning has been discussed.   Admission status:   Status is: Inpatient Needing IV antibiotics, imaging MRI of the foot, surgical evaluation   Disposition: From  - home             Planning for discharge in 2 days   Procedures:   No admission procedures for hospital encounter.   Antimicrobials:  Anti-infectives (From admission, onward)    Start     Dose/Rate Route Frequency Ordered Stop   12/04/23 1000  vancomycin  (VANCOREADY) IVPB 1750 mg/350 mL  Status:  Discontinued        1,750 mg 175 mL/hr over 120 Minutes Intravenous Every 48 hours 12/03/23 0927 12/03/23 1958   12/03/23 0030  [MAR Hold]  cefTRIAXone  (ROCEPHIN ) 2 g in sodium chloride  0.9 % 100 mL IVPB        (MAR Hold since Wed 12/05/2023 at 0832.Hold Reason: Transfer to a Procedural area)   2 g 200 mL/hr over 30 Minutes Intravenous Every 24 hours 12/02/23 0438     12/02/23 1000  vancomycin  (VANCOREADY) IVPB 1250 mg/250 mL  Status:  Discontinued        1,250 mg 166.7 mL/hr  over 90 Minutes Intravenous Every 48 hours 12/02/23 0625 12/03/23 0927   12/02/23 0215  cefTRIAXone  (ROCEPHIN ) 2 g in sodium chloride  0.9 % 100 mL IVPB        2 g 200 mL/hr over 30 Minutes Intravenous  Once 12/02/23 0209 12/02/23 0259   12/02/23 0215  vancomycin  (VANCOCIN ) IVPB 1000 mg/200 mL premix        1,000 mg 200 mL/hr over 60 Minutes Intravenous  Once 12/02/23 0209 12/02/23 0329        Medication:   [MAR Hold] amLODipine   10 mg Oral q morning   atorvastatin   80 mg Oral Daily   [MAR Hold] insulin  aspart  0-9 Units Subcutaneous TID WC   [MAR Hold] metoprolol  tartrate  25 mg Oral BID   [MAR Hold] mupirocin  ointment  1 Application Nasal BID   [MAR Hold] pantoprazole   40 mg Oral Daily    acetaminophen , aspirin , fentaNYL , Heparin  (Porcine) in NaCl, heparin  sodium (porcine), hydrALAZINE , iodixanol , labetalol , lidocaine  (PF), midazolam , [MAR Hold] ondansetron  **OR** [MAR  Hold] ondansetron  (ZOFRAN ) IV   Objective:   Vitals:   12/05/23 1030 12/05/23 1045 12/05/23 1100 12/05/23 1120  BP: (!) 143/69 (!) 153/70 (!) 143/76 (!) 147/82  Pulse: 61 60 60 65  Resp: (!) 23 18 19  (!) 25  Temp:      TempSrc:      SpO2: 100% 100% 100% 100%  Weight:      Height:        Intake/Output Summary (Last 24 hours) at 12/05/2023 1315 Last data filed at 12/05/2023 0100 Gross per 24 hour  Intake 476.16 ml  Output --  Net 476.16 ml   Filed Weights   12/01/23 2149 12/02/23 0338  Weight: 86.2 kg 87.3 kg     Physical examination:     Wounds: Right lower extremity with erythema, edema, second toe necrotic type    Media Information  Document Information  Photos    12/01/2023 23:38  Attached To:  Hospital Encounter on 12/01/23  Source Information    ------------------------------------------------------------------------------------------------------------------------------------------    LABs:     Latest Ref Rng & Units 12/05/2023    5:31 AM 12/04/2023    3:53 AM 12/03/2023     5:17 AM  CBC  WBC 4.0 - 10.5 K/uL 7.5  6.9  6.5   Hemoglobin 13.0 - 17.0 g/dL 85.2  86.6  86.9   Hematocrit 39.0 - 52.0 % 42.8  38.5  38.4   Platelets 150 - 400 K/uL 206  183  178       Latest Ref Rng & Units 12/05/2023    5:31 AM 12/04/2023    3:53 AM 12/03/2023    5:17 AM  CMP  Glucose 70 - 99 mg/dL 93  894  889   BUN 8 - 23 mg/dL 20  23  21    Creatinine 0.61 - 1.24 mg/dL 7.98  7.82  7.73   Sodium 135 - 145 mmol/L 139  142  141   Potassium 3.5 - 5.1 mmol/L 4.2  4.1  4.0   Chloride 98 - 111 mmol/L 106  102  106   CO2 22 - 32 mmol/L 21  27  27    Calcium  8.9 - 10.3 mg/dL 9.1  8.7  8.6        Micro Results Recent Results (from the past 240 hours)  Aerobic Culture w Gram Stain (superficial specimen)     Status: None (Preliminary result)   Collection Time: 12/03/23 10:35 AM   Specimen: Wound  Result Value Ref Range Status   Specimen Description WOUND  Final   Special Requests TOE  Final   Gram Stain NO WBC SEEN NO ORGANISMS SEEN   Final   Culture   Final    CULTURE REINCUBATED FOR BETTER GROWTH Performed at [facility], [address], [place], [place] [address]    Report Status PENDING  Incomplete  Surgical PCR screen     Status: None   Collection Time: 12/05/23  4:32 AM   Specimen: Nasal Mucosa; Nasal Swab  Result Value Ref Range Status   MRSA, PCR NEGATIVE NEGATIVE Final   Staphylococcus aureus NEGATIVE NEGATIVE Final    Comment: (NOTE) The Xpert SA Assay (FDA approved for NASAL specimens in patients 68 years of age and older), is one component of a comprehensive surveillance program. It is not intended to diagnose infection nor to guide or monitor treatment. Performed at [facility], [address], [place], [place] [address]     Radiology Reports PERIPHERAL VASCULAR  CATHETERIZATION Result Date: 12/05/2023 Patient name: Hector Rose MRN: [id number] DOB: Aug 22, 1940 Sex: male 12/05/2023 Pre-operative Diagnosis: Right lower extremity critical limb  ischemia with tissue loss at the toes Post-operative diagnosis:  Same Surgeon:  Fonda FORBES Rim, MD Procedure Performed: 1.  Ultrasound-guided micropuncture access of the left common femoral artery in retrograde fashion 2.  Aortogram 3.  Secondary cannulation, right lower extremity angiogram 4.  Third order cannulation, right lower extremity angiogram 5.  Selective angiography of the popliteal artery 6.  Balloon angioplasty 3 x 150 mm peroneal artery 7.  Balloon angioplasty 2.5 x 220 mm posterior tibial artery 8.  Device assisted closure-Mynx Sedation time 64 minutes, contrast volume 65 mL Indications: Patient is an 83 year old male with history of right sided osteomyelitis of the toe.  He had nonpalpable pulses on exam and depressed toe pressures on ABI in 2022.  After discussing risks and benefits of right lower extremity angiogram in an effort to define improve distal perfusion for wound healing, Hector Rose elected to proceed. Findings: Aortogram: No flow-limiting stenosis appreciated in the aortoiliac segments bilaterally.  Bilateral renal arteries patent. On the right: Widely patent common femoral artery, profunda, superficial femoral artery, popliteal artery Significant small vessel disease below the level of the knee with patent tibioperoneal trunk.  The anterior tibial artery is occluded the posterior tibial artery is atretic and occludes after roughly 5 cm.  The peroneal artery provides runoff to the foot and has an 8 cm tapering stenosis greater than 95%.  Medial lateral perforators fill the foot and retrograde fills the posterior tibial artery.  Procedure:  The patient was identified in the holding area and taken to room 8.  The patient was then placed supine on the table and prepped and draped in the usual sterile fashion.  A time out was called.  Ultrasound was used to evaluate the left common femoral artery.  It was patent .  A digital ultrasound image was acquired.  A micropuncture needle was used to  access the left common femoral artery under ultrasound guidance.  An 018 wire was advanced without resistance and a micropuncture sheath was placed.  The 018 wire was removed and a benson wire was placed.  The micropuncture sheath was exchanged for a 5 french sheath.  An omniflush catheter was advanced over the wire to the level of L-1.  An abdominal angiogram was obtained.  Next, using the omniflush catheter and a benson wire, the aortic bifurcation was crossed and the catheter was placed into theright external iliac artery and right runoff was obtained.  I elected to attempt intervention on the peroneal artery, and elected to attempt recanalization of the posterior tibial artery as the artery at the level of the ankle provided significant outflow to the foot.  The patient was heparinized and a 6 x 60 cm sheath was brought to the field and parked in the mid superficial femoral artery.  Next, a series of wires and catheters were used to navigate across the peroneal artery lesion.  A 3 x 150 mm balloon was brought into the field and the lesion balloon angioplastied.  Follow-up angiography demonstrated an excellent result with resolution of flow-limiting stenosis.  There was a small intimal defect, however there was no flow-limiting stenosis.  Next, my attention turned to the posterior tibial artery.  A 0.014 wire was used, and with significant effort, I was able to steer the wire through the posterior tibial artery occlusion, into the distal patent posterior tibial artery.  Once across,  a 2 x 220 mm balloon was brought onto the field and the entirety of the artery balloon angioplasty to.  This was followed by 2.5 x 220 mm balloon.  Follow-up angiography demonstrated an excellent result with recanalization of the native artery.  There was brisk, two-vessel runoff to the foot with the dorsalis pedis artery appreciated, and distal portion of the anterior tibial artery filling retrograde. Impression: Successful balloon  angioplasty of the peroneal artery.  Successful recanalization of the posterior tibial artery.  Patient has been maximally revascularized. Fonda FORBES Rim MD [facility] Office: [phone]    SIGNED: Adriana DELENA Grams, MD, [facility]. FAAFP. Jolynn Pack - [facility] Time spent - 55 min.  In seeing, evaluating and examining the patient. Reviewing medical records, labs, drawn plan of care. [facility],  Pager (please use [email or web address] to page/ text) Please use Epic Secure Chat for non-urgent communication (7AM-7PM)  If 7PM-7AM, please contact night-coverage [email or web address], 12/05/2023, 1:15 PM

## 2023-12-05 NOTE — Progress Notes (Signed)
  Progress Note    12/05/2023 8:42 AM * Day of Surgery *  Patient with right 2nd toe wound Scheduled for Angiogram today in cath lab with Dr. Lanis He did not have any questions regarding his procedure Keep NPO Consent ordered  Teretha Damme, PA-C [facility] [phone] 12/05/2023 8:42 AM

## 2023-12-05 NOTE — Plan of Care (Signed)
 Id brief note  Consulted on a right foot infection/om  Patient in or for angio   Will formally see tomorrow

## 2023-12-05 NOTE — Progress Notes (Signed)
 Received call from lab with patient's resulted labs, provider made aware.  Bun 20 Calcium  9.1 CO2 21 Chloride 106 Creat 2.01 Sodium 139 Potassium 4.2 Glucose 93

## 2023-12-06 DIAGNOSIS — S91104A Unspecified open wound of right lesser toe(s) without damage to nail, initial encounter: Secondary | ICD-10-CM

## 2023-12-06 DIAGNOSIS — M869 Osteomyelitis, unspecified: Secondary | ICD-10-CM | POA: Diagnosis not present

## 2023-12-06 DIAGNOSIS — I82501 Chronic embolism and thrombosis of unspecified deep veins of right lower extremity: Secondary | ICD-10-CM

## 2023-12-06 DIAGNOSIS — B952 Enterococcus as the cause of diseases classified elsewhere: Secondary | ICD-10-CM

## 2023-12-06 DIAGNOSIS — Z9862 Peripheral vascular angioplasty status: Secondary | ICD-10-CM

## 2023-12-06 DIAGNOSIS — L03115 Cellulitis of right lower limb: Secondary | ICD-10-CM | POA: Diagnosis not present

## 2023-12-06 DIAGNOSIS — L0390 Cellulitis, unspecified: Secondary | ICD-10-CM

## 2023-12-06 LAB — CBC
HCT: 38.5 % — ABNORMAL LOW (ref 39.0–52.0)
Hemoglobin: 13.1 g/dL (ref 13.0–17.0)
MCH: 30.4 pg (ref 26.0–34.0)
MCHC: 34 g/dL (ref 30.0–36.0)
MCV: 89.3 fL (ref 80.0–100.0)
Platelets: 176 K/uL (ref 150–400)
RBC: 4.31 MIL/uL (ref 4.22–5.81)
RDW: 12 % (ref 11.5–15.5)
WBC: 9.2 K/uL (ref 4.0–10.5)
nRBC: 0 % (ref 0.0–0.2)

## 2023-12-06 LAB — HEPARIN LEVEL (UNFRACTIONATED)
Heparin Unfractionated: 0.6 [IU]/mL (ref 0.30–0.70)
Heparin Unfractionated: >1.1 [IU]/mL — ABNORMAL HIGH (ref 0.30–0.70)

## 2023-12-06 LAB — BASIC METABOLIC PANEL WITH GFR
Anion gap: 12 (ref 5–15)
BUN: 20 mg/dL (ref 8–23)
CO2: 24 mmol/L (ref 22–32)
Calcium: 8.9 mg/dL (ref 8.9–10.3)
Chloride: 103 mmol/L (ref 98–111)
Creatinine, Ser: 2.06 mg/dL — ABNORMAL HIGH (ref 0.61–1.24)
GFR, Estimated: 32 mL/min — ABNORMAL LOW (ref >60)
Glucose, Bld: 90 mg/dL (ref 70–99)
Potassium: 4.2 mmol/L (ref 3.5–5.1)
Sodium: 139 mmol/L (ref 135–145)

## 2023-12-06 LAB — LIPID PANEL
Cholesterol: 168 mg/dL (ref 0–200)
HDL: 37 mg/dL — ABNORMAL LOW (ref >40)
LDL Cholesterol: 118 mg/dL — ABNORMAL HIGH (ref 0–99)
Total CHOL/HDL Ratio: 4.5 ratio
Triglycerides: 63 mg/dL (ref <150)
VLDL: 13 mg/dL (ref 0–40)

## 2023-12-06 LAB — GLUCOSE, CAPILLARY
Glucose-Capillary: 91 mg/dL (ref 70–99)
Glucose-Capillary: 86 mg/dL (ref 70–99)
Glucose-Capillary: 131 mg/dL — ABNORMAL HIGH (ref 70–99)

## 2023-12-06 LAB — APTT: aPTT: 86 s — ABNORMAL HIGH (ref 24–36)

## 2023-12-06 MED ORDER — CLOPIDOGREL BISULFATE 75 MG PO TABS: 75.0000 mg | ORAL_TABLET | Freq: Every day | ORAL | 1 refills | Status: AC

## 2023-12-06 MED ORDER — PANTOPRAZOLE SODIUM 40 MG PO TBEC: 40.0000 mg | DELAYED_RELEASE_TABLET | Freq: Every day | ORAL | 2 refills | Status: AC

## 2023-12-06 MED ORDER — RIVAROXABAN 20 MG PO TABS
20.0000 mg | ORAL_TABLET | Freq: Every day | ORAL | Status: DC
  Administered 2023-12-06: 20 mg via ORAL
  Filled 2023-12-06: qty 1

## 2023-12-06 MED ORDER — DONEPEZIL HCL 5 MG PO TABS
5.0000 mg | ORAL_TABLET | Freq: Every day | ORAL | Status: DC
Start: 2023-12-06 — End: 2023-12-06

## 2023-12-06 MED ORDER — CEFADROXIL 500 MG PO CAPS
500.0000 mg | ORAL_CAPSULE | Freq: Two times a day (BID) | ORAL | Status: DC
  Filled 2023-12-06: qty 1

## 2023-12-06 MED ORDER — CEFADROXIL 500 MG PO CAPS: 500.0000 mg | ORAL_CAPSULE | Freq: Two times a day (BID) | ORAL | 0 refills | Status: AC

## 2023-12-06 MED ORDER — CEFAZOLIN SODIUM-DEXTROSE 1-4 GM/50ML-% IV SOLN: 1.0000 g | Freq: Three times a day (TID) | INTRAVENOUS | Status: DC

## 2023-12-06 MED ORDER — AMLODIPINE BESYLATE 10 MG PO TABS: 10.0000 mg | ORAL_TABLET | Freq: Every morning | ORAL | 0 refills | Status: AC

## 2023-12-06 MED ORDER — ATORVASTATIN CALCIUM 80 MG PO TABS: 80.0000 mg | ORAL_TABLET | Freq: Every day | ORAL | 0 refills | Status: AC

## 2023-12-06 NOTE — Progress Notes (Addendum)
 PHARMACY - ANTICOAGULATION CONSULT NOTE  Pharmacy Consult for heparin  Indication: h/o  DVT  Allergies  Allergen Reactions   Ace Inhibitors Swelling    Angio edema (swelling of lips) Swelling around face Swelling in mouth   Allopurinol  Itching    Makes feet fill numb and tingle Pt stated he is not allergic to this 12-02-2023   Metformin  And Related Itching    Itching in higher doses    Patient Measurements: Height: 6' 1 (185.4 cm) Weight: 84.5 kg (186 lb 4.6 oz) IBW/kg (Calculated) : 79.9 HEPARIN  DW (KG): 84.5  Vital Signs: Temp: 98.3 F (36.8 C) (09/25 0314) Temp Source: Oral (09/25 0314) BP: 148/76 (09/25 0314)  Labs: Recent Labs    12/04/23 0353 12/05/23 0531 12/06/23 0258  HGB 13.3 14.7 13.1  HCT 38.5* 42.8 38.5*  PLT 183 206 176  APTT  --  94* 86*  HEPARINUNFRC  --  0.79* 0.60  CREATININE 2.17* 2.01* 2.06*    Estimated Creatinine Clearance: 31.2 mL/min (A) (by C-G formula based on SCr of 2.06 mg/dL (H)).   Medical History: Past Medical History:  Diagnosis Date   Asthma 04-11-11   AS CHILD ONLY   BPH (benign prostatic hypertrophy) with urinary obstruction    Carpal tunnel syndrome    patient denies at 05/14/17 preop visit    Difficult intubation 04-11-11   08-11-97 some issues with intubation/record with chart   DJD (degenerative joint disease)    DM (diabetes mellitus) (HCC) 04-11-11   tyep II    DVT femoral (deep venous thrombosis) with thrombophlebitis (HCC) 04-11-11   ?'09-tx. on Xarelton x 3-4 years    GERD (gastroesophageal reflux disease) 04-11-11   mild, no med in 1 month   Gout 04-11-11    ankle 2 yrs ago   Hemorrhoids    Hypercholesteremia    Hypertension    Internal hemorrhoids    Keloid 04-11-11   multiple-arms,back, chest   Pulmonary nodule 04-11-11   PT. NOT AWARE -denies problems with breathing   Renal calculus    Thyroid  disease    hypothyroid    Assessment: 83 year old male admitted to [facility] for leg swelling found to have toe  osteomyelitis. Patient was on xarelto  pta for history of DVT, his last dose prior to admission was 9/19. He has been receiving sq lovenox  during admission. New orders to transition to IV heparin  until surgery times are more defined. Will omit heparin  bolus given recent lovenox  injection last night. CBC has remained stable this admit.   Heparin  drip restarted post op at 1800 on 9/24. Heparin  level this AM is 0.60 (therapeutic), APTT 86 sec (both correlate). Wil continue current rate and await orders to transition back to Po xarelto  from PTA. Hgb 13.1, Platelets at 176   Goal of Therapy:  Heparin  level 0.3-0.7 units/ml aPTT 66-102 seconds Monitor platelets by anticoagulation protocol: Yes   Plan:  Continue heparin  drip at 1050 units/hr Confirmatory Heparin  level  at 1030 Continue to monitor H&H and platelets  Tameyah Koch A. Lyle, PharmD, BCPS, FNKF Clinical Pharmacist [place] Please utilize Amion for appropriate phone number to reach the unit pharmacist [facility])  12/06/2023 8:06 AM

## 2023-12-06 NOTE — Progress Notes (Addendum)
 PROGRESS NOTE    Patient: Hector Rose                            PCP: Marvine Rush, MD                    DOB: 08-06-1940            DOA: 12/01/2023 FMW:[id number]             DOS: 12/06/2023, 9:27 AM   LOS: 4 days   Date of Service: The patient was seen and examined on 12/06/2023  Subjective:   The patient was seen and examined this morning, stable no acute distress Hemodynamically stable On heparin  drip Postop day #1, status post revascularization right lower extremity-the procedure well  Brief Narrative:    Hector Rose is a 83 y.o. male with medical history significant of T2DM, hypertension, gout, GERD, DVT on Xarelto  who presents to the emergency department due to 1 week onset of swelling of right foot and right second toe wound.  He states that he started noticing difficulty in being able to put on his right shoe and that his shoe has been rubbing against the second toe within the same timeframe.  He went to an urgent care about 2 days ago and was prescribed with cephalexin, he returned to the urgent care today due to no improvement in leg swelling and toe wound and his dose was increased to 500 mg twice daily (he has not started to take this), but decided to go to the ED for further evaluation.  He denies fever, chest pain, shortness of breath, chills, pain.   ED Course:  Hemodynamically stable.  Workup in ED showed normal CBC and BMP except for blood glucose of 232 and creatinine of 2.20 (baseline creatinine about 2.0-2.1).  Sed rate 67. Right second toe x-ray showed bone destruction at the tip of the right second toe distal phalanx compatible with osteomyelitis. Patient was treated with IV ceftriaxone  and vancomycin . Orthopedic surgeon on-call Dr. Sharl was consulted and there was no indication for any emergent orthopedic intervention and if amputation is necessary, patient can be referred to Dr. Harden as an outpatient per EDP.    Assessment & Plan:   Principal Problem:    Osteomyelitis of second toe of right foot (HCC) Active Problems:   Essential hypertension   GERD (gastroesophageal reflux disease)   Type 2 diabetes mellitus with hyperglycemia (HCC)   CKD (chronic kidney disease), stage IV (HCC)   Swelling of right lower extremity    Osteomyelitis of second toe of right foot Right second toe x-ray was suggestive of osteomyelitis -MRI right foot: Osteomyelitis of the second toe. There is a wound at the tip of the second toe which communicates with the bone. No obvious involvement of the proximal phalanx.  ID: Consulted, switched Vanco/Rocephin --to Rocephin  -- Consulting general surgery Dr. Kallie -we discussed this case with vascular surgery Dr. Katheryn at [facility] on ABI + peripheral vascular disease-recommend further workup including angiography  ABI: Right lower extremity borderline abnormal,-anterior tibial territory suggestive moderate disease.  Left lower extremity-within normal limits   12/05/23 status post recanalization of posterior tibial artery, balloon angioplasty of the peroneal artery--this has maximize revascularization -Per recommendation when stable continue anticoagulation with high-dose statin  12/06/23- Per vascular team to continue Xarelto  plus Plavix   - Appreciate final antibiotic recommendation from ID Dr. Overton recommending: Cefadroxil  would be the treatment then for another  10 more days    Type 2 diabetes mellitus with hyperglycemia Hemoglobin A1c about 3 months ago was 7.8 Check CBG, q. ACHS, SSI coverage   CKD stage IV Creatinine of 2.20 (baseline creatinine about 2.0-2.1) Renally adjust medications, avoid nephrotoxic agents/dehydration/hypotension Lab Results  Component Value Date   CREATININE 2.06 (H) 12/06/2023   CREATININE 2.01 (H) 12/05/2023   CREATININE 2.17 (H) 12/04/2023      Lower extremity DVT/right lower extremity edema Xarelto  will be temporarily on HOLD -at this time in anticipation for possible  surgical intervention of patient's right second toe osteomyelitis - Heparin  drip for now-in anticipation of possible surgical amputation right second toe with versus medical management IV ABX  US ;  RTlower extremities Deep vein thrombosis limited to the right popliteal and femoral vein which appears mostly chronic however there is a short segment of acute appearing thrombus in the peripheral femoral vein.    Essential hypertension Continue amlodipine , Lopressor    GERD Continue Protonix     ------------------------------------------------------------------------------------------------ Nutritional status:  The patient's BMI is: Body mass index is 24.58 kg/m. I agree with the assessment and plan as outlined  Nutrition Status:      Skin Assessment: Right lower extremity erythema edema, with a second toe necrotic wound:   ------------------------------------------------------------------------------------------------  DVT prophylaxis:  SCDs Start: 12/02/23 1855 SCDs Start: 12/02/23 0507   Code Status:   Code Status: Full Code  Family Communication: No family member present at bedside-  -Advance care planning has been discussed.   Admission status:   Status is: Inpatient Needing IV antibiotics, imaging MRI of the foot, surgical evaluation   Disposition: From  - home             Planning for discharge in 1 days  Discharge pending final decision per vascular surgeon regarding second toe infection, gangrene, and ID regarding antibiotic recommendations  Pending recommendation from vascular as patient is on Xarelto - to add aspirin  versus Plavix .  (Discussed  with ID and vascular team regarding antibiotics, anticoagulation via secure chat)   Procedures:   No admission procedures for hospital encounter.   Antimicrobials:  Anti-infectives (From admission, onward)    Start     Dose/Rate Route Frequency Ordered Stop   12/04/23 1000  vancomycin  (VANCOREADY) IVPB 1750  mg/350 mL  Status:  Discontinued        1,750 mg 175 mL/hr over 120 Minutes Intravenous Every 48 hours 12/03/23 0927 12/03/23 1958   12/03/23 0030  cefTRIAXone  (ROCEPHIN ) 2 g in sodium chloride  0.9 % 100 mL IVPB        2 g 200 mL/hr over 30 Minutes Intravenous Every 24 hours 12/02/23 0438     12/02/23 1000  vancomycin  (VANCOREADY) IVPB 1250 mg/250 mL  Status:  Discontinued        1,250 mg 166.7 mL/hr over 90 Minutes Intravenous Every 48 hours 12/02/23 0625 12/03/23 0927   12/02/23 0215  cefTRIAXone  (ROCEPHIN ) 2 g in sodium chloride  0.9 % 100 mL IVPB        2 g 200 mL/hr over 30 Minutes Intravenous  Once 12/02/23 0209 12/02/23 0259   12/02/23 0215  vancomycin  (VANCOCIN ) IVPB 1000 mg/200 mL premix        1,000 mg 200 mL/hr over 60 Minutes Intravenous  Once 12/02/23 0209 12/02/23 0329        Medication:   amLODipine   10 mg Oral q morning   atorvastatin   80 mg Oral Daily   clopidogrel   75 mg Oral Q breakfast  donepezil   5 mg Oral QHS   insulin  aspart  0-9 Units Subcutaneous TID WC   metoprolol  tartrate  25 mg Oral BID   mupirocin  ointment  1 Application Nasal BID   pantoprazole   40 mg Oral Daily   sodium chloride  flush  3 mL Intravenous Q12H    sodium chloride , acetaminophen , hydrALAZINE , labetalol , ondansetron  **OR** ondansetron  (ZOFRAN ) IV, sodium chloride  flush   Objective:   Vitals:   12/05/23 2215 12/05/23 2345 12/06/23 0314 12/06/23 0827  BP: (!) 144/78 (!) 149/77 (!) 148/76 125/87  Pulse:    71  Resp:  20 20 16   Temp:  98.9 F (37.2 C) 98.3 F (36.8 C) 98.9 F (37.2 C)  TempSrc:  Oral Oral Oral  SpO2:    98%  Weight:      Height:        Intake/Output Summary (Last 24 hours) at 12/06/2023 9072 Last data filed at 12/06/2023 0800 Gross per 24 hour  Intake 388 ml  Output 0 ml  Net 388 ml   Filed Weights   12/01/23 2149 12/02/23 0338 12/05/23 1525  Weight: 86.2 kg 87.3 kg 84.5 kg     Physical examination:     General:  AAO x 3,  cooperative, no  distress;   HEENT:  Normocephalic, PERRL, otherwise with in Normal limits   Neuro:  CNII-XII intact. , normal motor and sensation, reflexes intact   Lungs:   Clear to auscultation BL, Respirations unlabored,  No wheezes / crackles  Cardio:    S1/S2, RRR, No murmure, No Rubs or Gallops   Abdomen:  Soft, non-tender, bowel sounds active all four quadrants, no guarding or peritoneal signs.  Muscular  skeletal:  Limited exam -global generalized weaknesses - in bed, able to move all 4 extremities,   2+ pulses,  symmetric, No pitting edema  Skin:  Dry, warm to touch, right lower extremity much improved erythema edema Type of second toe dry scabbing wound      Wounds: Right lower extremity with erythema, edema, second toe necrotic type    Media Information  Document Information  Photos    12/01/2023 23:38  Attached To:  Hospital Encounter on 12/01/23  Source Information    ------------------------------------------------------------------------------------------------------------------------------------------    LABs:     Latest Ref Rng & Units 12/06/2023    2:58 AM 12/05/2023    5:31 AM 12/04/2023    3:53 AM  CBC  WBC 4.0 - 10.5 K/uL 9.2  7.5  6.9   Hemoglobin 13.0 - 17.0 g/dL 86.8  85.2  86.6   Hematocrit 39.0 - 52.0 % 38.5  42.8  38.5   Platelets 150 - 400 K/uL 176  206  183       Latest Ref Rng & Units 12/06/2023    2:58 AM 12/05/2023    5:31 AM 12/04/2023    3:53 AM  CMP  Glucose 70 - 99 mg/dL 90  93  894   BUN 8 - 23 mg/dL 20  20  23    Creatinine 0.61 - 1.24 mg/dL 7.93  7.98  7.82   Sodium 135 - 145 mmol/L 139  139  142   Potassium 3.5 - 5.1 mmol/L 4.2  4.2  4.1   Chloride 98 - 111 mmol/L 103  106  102   CO2 22 - 32 mmol/L 24  21  27    Calcium  8.9 - 10.3 mg/dL 8.9  9.1  8.7        Micro Results Recent Results (from  the past 240 hours)  Aerobic Culture w Gram Stain (superficial specimen)     Status: None (Preliminary result)   Collection Time: 12/03/23 10:35  AM   Specimen: Wound  Result Value Ref Range Status   Specimen Description WOUND  Final   Special Requests TOE  Final   Gram Stain NO WBC SEEN NO ORGANISMS SEEN   Final   Culture   Final    CULTURE REINCUBATED FOR BETTER GROWTH Performed at [facility], [address], [place], [place] [address]    Report Status PENDING  Incomplete  Surgical PCR screen     Status: None   Collection Time: 12/05/23  4:32 AM   Specimen: Nasal Mucosa; Nasal Swab  Result Value Ref Range Status   MRSA, PCR NEGATIVE NEGATIVE Final   Staphylococcus aureus NEGATIVE NEGATIVE Final    Comment: (NOTE) The Xpert SA Assay (FDA approved for NASAL specimens in patients 18 years of age and older), is one component of a comprehensive surveillance program. It is not intended to diagnose infection nor to guide or monitor treatment. Performed at [facility], [address], [place], [place] [address]     Radiology Reports No results found.   SIGNED: Adriana DELENA Grams, MD, FHM. FAAFP. Jolynn Pack - [facility] Time spent - 55 min.  In seeing, evaluating and examining the patient. Reviewing medical records, labs, drawn plan of care. [facility],  Pager (please use [email or web address] to page/ text) Please use Epic Secure Chat for non-urgent communication (7AM-7PM)  If 7PM-7AM, please contact night-coverage [email or web address], 12/06/2023, 9:27 AM

## 2023-12-06 NOTE — Discharge Summary (Addendum)
 Physician Discharge Summary   Patient: Hector Rose MRN: [id number] DOB: 1940-10-30  Admit date:     12/01/2023  Discharge date: 12/06/23  Discharge Physician: Adriana DELENA Grams   PCP: Marvine Rush, MD   Recommendations at discharge:   Follow-up with vascular team in 1 week Follow-up with infectious disease Dr. Overton in 1 week Continue current recommended antibiotics for 10 days Continue current anticoagulation with Xarelto  plus Plavix  Follow-up with PCP in 1 week  Discharge Diagnoses: Principal Problem:   Osteomyelitis of second toe of right foot (HCC) Active Problems:   Essential hypertension   GERD (gastroesophageal reflux disease)   Type 2 diabetes mellitus with hyperglycemia (HCC)   CKD (chronic kidney disease), stage IV (HCC)   Swelling of right lower extremity  Resolved Problems:   * No resolved hospital problems. [place] Hospital Course:  Hector Rose is a 83 y.o. male with medical history significant of T2DM, hypertension, gout, GERD, DVT on Xarelto  who presents to the emergency department due to 1 week onset of swelling of right foot and right second toe wound.  He states that he started noticing difficulty in being able to put on his right shoe and that his shoe has been rubbing against the second toe within the same timeframe.  He went to an urgent care about 2 days ago and was prescribed with cephalexin, he returned to the urgent care today due to no improvement in leg swelling and toe wound and his dose was increased to 500 mg twice daily (he has not started to take this), but decided to go to the ED for further evaluation.  He denies fever, chest pain, shortness of breath, chills, pain.   ED Course:  Hemodynamically stable.  Workup in ED showed normal CBC and BMP except for blood glucose of 232 and creatinine of 2.20 (baseline creatinine about 2.0-2.1).  Sed rate 67. Right second toe x-ray showed bone destruction at the tip of the right second toe distal phalanx compatible  with osteomyelitis. Patient was treated with IV ceftriaxone  and vancomycin . Orthopedic surgeon on-call Dr. Sharl was consulted and there was no indication for any emergent orthopedic intervention and if amputation is necessary, patient can be referred to Dr. Harden as an outpatient per EDP.  ------------------------------------------------------------------------------------------------------------------------ Osteomyelitis of second toe of right foot Right second toe x-ray was suggestive of osteomyelitis -MRI right foot: Osteomyelitis of the second toe. There is a wound at the tip of the second toe which communicates with the bone. No obvious involvement of the proximal phalanx.   ID: Consulted, switched Vanco/Rocephin --to Rocephin  -- Consulting general surgery Dr. Kallie -we discussed this case with vascular surgery Dr. Katheryn at [facility] on ABI + peripheral vascular disease-recommend further workup including angiography   ABI: Right lower extremity borderline abnormal,-anterior tibial territory suggestive moderate disease.  Left lower extremity-within normal limits     12/05/23 status post recanalization of posterior tibial artery, balloon angioplasty of the peroneal artery--this has maximize revascularization -Per recommendation when stable continue anticoagulation with high-dose statin   12/06/23- Per vascular team to continue Xarelto  plus Plavix    - Appreciate final antibiotic recommendation from ID Dr. Overton recommending: Cefadroxil  would be the treatment then for another 10 more days      Type 2 diabetes mellitus with hyperglycemia Hemoglobin A1c about 3 months ago was 7.8 Resuming home medication   CKD stage IV Creatinine of 2.20 (baseline creatinine about 2.0-2.1) Renally adjust medications, avoid nephrotoxic agents/dehydration/hypotension Lab Results  Component Value Date   CREATININE  2.06 (H) 12/06/2023   CREATININE 2.01 (H) 12/05/2023   CREATININE 2.17 (H) 12/04/2023       Lower extremity DVT/right lower extremity edema Xarelto  = was on hold during this admission, per vascular may be resumed plus Plavix  now   US ;  RTlower extremities Deep vein thrombosis limited to the right popliteal and femoral vein which appears mostly chronic however there is a short segment of acute appearing thrombus in the peripheral femoral vein.     Essential hypertension Continue amlodipine , Lopressor    GERD Continue Protonix      ------------------------------------------------------------------------------------------------------------------ Nutritional status:  The patient's BMI is: Body mass index is 24.58 kg/m. I agree with the assessment and plan as outlined      Consultants: General surgery/infectious disease/vascular surgery Procedures performed: Status post revascularization, Disposition: Home  Diet recommendation:  Discharge Diet Orders (From admission, onward)     Start     Ordered   12/06/23 0000  Diet - low sodium heart healthy        12/06/23 1250           Cardiac and Carb modified diet DISCHARGE MEDICATION: Allergies as of 12/06/2023       Reactions   Ace Inhibitors Swelling   Angio edema (swelling of lips) Swelling around face Swelling in mouth   Allopurinol  Itching   Makes feet fill numb and tingle Pt stated he is not allergic to this 12-02-2023   Metformin  And Related Itching   Itching in higher doses        Medication List     STOP taking these medications    BD Veo Insulin  Syringe U/F 31G X 15/64 1 ML Misc Generic drug: Insulin  Syringe-Needle U-100   hydrochlorothiazide  25 MG tablet Commonly known as: HYDRODIURIL    omeprazole  20 MG capsule Commonly known as: PRILOSEC Replaced by: pantoprazole  40 MG tablet   OneTouch Delica Plus Lancet33G Misc   OneTouch Ultra test strip Generic drug: glucose blood       TAKE these medications    Accu-Chek Guide Me w/Device Kit 1 Piece by Does not apply route as  directed.   amLODipine  10 MG tablet Commonly known as: NORVASC  Take 1 tablet (10 mg total) by mouth every morning.   atorvastatin  80 MG tablet Commonly known as: LIPITOR  Take 1 tablet (80 mg total) by mouth daily. Start taking on: December 07, 2023   cefadroxil  500 MG capsule Commonly known as: DURICEF Take 1 capsule (500 mg total) by mouth 2 (two) times daily for 10 days.   clopidogrel  75 MG tablet Commonly known as: PLAVIX  Take 1 tablet (75 mg total) by mouth daily with breakfast. Start taking on: December 07, 2023   diphenhydrAMINE  25 MG tablet Commonly known as: BENADRYL  Take 25 mg by mouth daily as needed for allergies.   donepezil  5 MG tablet Commonly known as: ARICEPT  Take 5 mg by mouth at bedtime.   fluticasone  50 MCG/ACT nasal spray Commonly known as: FLONASE  Place 2 sprays daily as needed into both nostrils for allergies or rhinitis.   FreeStyle [place] 2 Reader Espiridion As directed   [employer] 2 Sensor Misc APPLY NEW SENSOR EVERY 14 (FOURTEEN) DAYS.   Jardiance  10 MG Tabs tablet Generic drug: empagliflozin  TAKE 1 TABLET BY MOUTH EVERY DAY   metoprolol  tartrate 25 MG tablet Commonly known as: LOPRESSOR  Take 25 mg by mouth 2 (two) times daily.   pantoprazole  40 MG tablet Commonly known as: PROTONIX  Take 1 tablet (40 mg total) by mouth daily. Start  taking on: December 07, 2023 Replaces: omeprazole  20 MG capsule   Xarelto  20 MG Tabs tablet Generic drug: rivaroxaban  Take 20 mg by mouth daily.               Discharge Care Instructions  (From admission, onward)           Start     Ordered   12/06/23 0000  Discharge wound care:       Comments: Per instructions   12/06/23 1250            Follow-up Information     [facility] [place] Follow up in 4 week(s).   Specialty: Vascular Surgery Why: Office will call to arrange your appt(s) Contact information: [address] [place]  [phone]        [facility] Follow up.   Specialty: Home Health Services Why: HHRN arranged to follow for wound care- they will contact you to schedule Contact information: [address] [place] [address] [phone]                Discharge Exam: Fredricka Weights   12/01/23 2149 12/02/23 0338 12/05/23 1525  Weight: 86.2 kg 87.3 kg 84.5 kg     General:  AAO x 3,  cooperative, no distress;   HEENT:  Normocephalic, PERRL, otherwise with in Normal limits   Neuro:  CNII-XII intact. , normal motor and sensation, reflexes intact   Lungs:   Clear to auscultation BL, Respirations unlabored,  No wheezes / crackles  Cardio:    S1/S2, RRR, No murmure, No Rubs or Gallops   Abdomen:  Soft, non-tender, bowel sounds active all four quadrants, no guarding or peritoneal signs.  Muscular  skeletal:  Limited exam -global generalized weaknesses - in bed, able to move all 4 extremities,   2+ pulses,  symmetric, No pitting edema  Skin:  Dry, warm to touch, right lower extremity much improved erythema edema Type of second toe dry scabbing wound       Wounds: Right lower extremity with erythema, edema, second toe necrotic type    Media Information  Document Information        Condition at discharge: good  The results of significant diagnostics from this hospitalization (including imaging, microbiology, ancillary and laboratory) are listed below for reference.   Imaging Studies: PERIPHERAL VASCULAR CATHETERIZATION Result Date: 12/05/2023 Patient name: Alexandro Line MRN: [id number] DOB: 27-Aug-1940 Sex: male 12/05/2023 Pre-operative Diagnosis: Right lower extremity critical limb ischemia with tissue loss at the toes Post-operative diagnosis:  Same Surgeon:  Fonda FORBES Rim, MD Procedure Performed: 1.  Ultrasound-guided micropuncture access of the left common femoral artery in retrograde fashion 2.  Aortogram 3.   Secondary cannulation, right lower extremity angiogram 4.  Third order cannulation, right lower extremity angiogram 5.  Selective angiography of the popliteal artery 6.  Balloon angioplasty 3 x 150 mm peroneal artery 7.  Balloon angioplasty 2.5 x 220 mm posterior tibial artery 8.  Device assisted closure-Mynx Sedation time 64 minutes, contrast volume 65 mL Indications: Patient is an 83 year old male with history of right sided osteomyelitis of the toe.  He had nonpalpable pulses on exam and depressed toe pressures on ABI in 2022.  After discussing risks and benefits of right lower extremity angiogram in an effort to define improve distal perfusion for wound healing, Jonette elected to proceed. Findings: Aortogram: No flow-limiting stenosis  appreciated in the aortoiliac segments bilaterally.  Bilateral renal arteries patent. On the right: Widely patent common femoral artery, profunda, superficial femoral artery, popliteal artery Significant small vessel disease below the level of the knee with patent tibioperoneal trunk.  The anterior tibial artery is occluded the posterior tibial artery is atretic and occludes after roughly 5 cm.  The peroneal artery provides runoff to the foot and has an 8 cm tapering stenosis greater than 95%.  Medial lateral perforators fill the foot and retrograde fills the posterior tibial artery.  Procedure:  The patient was identified in the holding area and taken to room 8.  The patient was then placed supine on the table and prepped and draped in the usual sterile fashion.  A time out was called.  Ultrasound was used to evaluate the left common femoral artery.  It was patent .  A digital ultrasound image was acquired.  A micropuncture needle was used to access the left common femoral artery under ultrasound guidance.  An 018 wire was advanced without resistance and a micropuncture sheath was placed.  The 018 wire was removed and a benson wire was placed.  The micropuncture sheath was  exchanged for a 5 french sheath.  An omniflush catheter was advanced over the wire to the level of L-1.  An abdominal angiogram was obtained.  Next, using the omniflush catheter and a benson wire, the aortic bifurcation was crossed and the catheter was placed into theright external iliac artery and right runoff was obtained.  I elected to attempt intervention on the peroneal artery, and elected to attempt recanalization of the posterior tibial artery as the artery at the level of the ankle provided significant outflow to the foot.  The patient was heparinized and a 6 x 60 cm sheath was brought to the field and parked in the mid superficial femoral artery.  Next, a series of wires and catheters were used to navigate across the peroneal artery lesion.  A 3 x 150 mm balloon was brought into the field and the lesion balloon angioplastied.  Follow-up angiography demonstrated an excellent result with resolution of flow-limiting stenosis.  There was a small intimal defect, however there was no flow-limiting stenosis.  Next, my attention turned to the posterior tibial artery.  A 0.014 wire was used, and with significant effort, I was able to steer the wire through the posterior tibial artery occlusion, into the distal patent posterior tibial artery.  Once across, a 2 x 220 mm balloon was brought onto the field and the entirety of the artery balloon angioplasty to.  This was followed by 2.5 x 220 mm balloon.  Follow-up angiography demonstrated an excellent result with recanalization of the native artery.  There was brisk, two-vessel runoff to the foot with the dorsalis pedis artery appreciated, and distal portion of the anterior tibial artery filling retrograde. Impression: Successful balloon angioplasty of the peroneal artery.  Successful recanalization of the posterior tibial artery.  Patient has been maximally revascularized. Fonda FORBES Rim MD [facility] Office: [phone]   US   ARTERIAL ABI (SCREENING LOWER EXTREMITY) Result Date: 12/04/2023 CLINICAL DATA:  Osteomyelitis of second toe EXAM: NONINVASIVE PHYSIOLOGIC VASCULAR STUDY OF BILATERAL LOWER EXTREMITIES TECHNIQUE: Evaluation of both lower extremities were performed at rest, including calculation of ankle-brachial indices with single level Doppler, pressure and pulse volume recording. COMPARISON:  None Available. FINDINGS: Right ABI:  0.9 Left ABI:  1.0 Right Lower Extremity: The posterior tibial index is borderline abnormal. The dorsalis  pedis index and waveforms are degraded. Left Lower Extremity: The posterior tibial index is within normal limits. The dorsalis pedis index is borderline abnormal and degraded. 0.9-0.99 Borderline PAD IMPRESSION: 1. Right lower extremity: Borderline abnormal right-sided ABI. Degraded waveform in the anterior tibial territory which suggests moderate disease in that territory. 2. Left lower extremity: ABI is within normal limits. Mild peripheral arterial disease is suspected based on waveform analysis. Electronically Signed   By: Maude Naegeli M.D.   On: 12/04/2023 06:13   MR FOOT RIGHT WO CONTRAST Result Date: 12/03/2023 MR FOOT WITHOUT IV CONTRAST RIGHT COMPARISON: None. CLINICAL HISTORY: Second toe swelling. PULSE SEQUENCES: Ax T1, Ax T2 FS, Sag T1, Sag T2 FS, Cor STIR, Cor T1 without contrast. FINDINGS: There is a wound at the tip of the second toe. There is destruction of the distal phalanx of the second toe consistent with acute osteomyelitis. There is mild edema in the second middle phalanx suggesting involvement. There is no interphalangeal effusion present. No obvious involvement of the proximal phalanx. Otherwise, the bones of the forefoot demonstrate mild degenerative change without marrow edema. Mild degenerative changes in the midfoot. Moderate subcutaneous edema is identified in the dorsum of the foot and in the second toe. There is no significant tenosynovitis or tendinosis. No  drainable collection. IMPRESSION: Osteomyelitis of the second toe as above. There is a wound at the tip of the second toe which communicates with the bone. No obvious involvement of the proximal phalanx. Moderate dorsal subcutaneous edema without drainable collection. Electronically signed by: Norleen Satchel MD 12/03/2023 10:40 AM EDT RP Workstation: [id number]   US  Venous Img Lower Unilateral Right (DVT) Result Date: 12/02/2023 CLINICAL DATA:  83 year old male with right lower extremity swelling. EXAM: RIGHT LOWER EXTREMITY VENOUS DOPPLER ULTRASOUND TECHNIQUE: Gray-scale sonography with graded compression, as well as color Doppler and duplex ultrasound were performed to evaluate the right lower extremity deep venous systems from the level of the common femoral vein and including the common femoral, femoral, profunda femoral, popliteal and calf veins including the posterior tibial, peroneal and gastrocnemius veins when visible. Spectral Doppler was utilized to evaluate flow at rest and with distal augmentation maneuvers in the common femoral, femoral and popliteal veins. The contralateral common femoral vein was also evaluated for comparison. COMPARISON:  None Available. FINDINGS: RIGHT LOWER EXTREMITY Common Femoral Vein: No evidence of thrombus. Normal compressibility, respiratory phasicity and response to augmentation. Central Greater Saphenous Vein: No evidence of thrombus. Normal compressibility and flow on color Doppler imaging. Central Profunda Femoral Vein: No evidence of thrombus. Normal compressibility and flow on color Doppler imaging. Femoral Vein: There is nonocclusive, non expansile, slightly hyperechoic and wall adherent thrombus visualized in the mid femoral vein. There is occlusive, mildly expansile thrombus visualized in the peripheral aspect of the femoral vein. Popliteal Vein: There is nonocclusive, non expansile, slightly hyperechoic wall adherent thrombus visualized. Calf Veins: No evidence  of thrombus. Normal compressibility and flow on color Doppler imaging. Venous Reflux:  None. Other Findings:  None. LEFT LOWER EXTREMITY Common Femoral Vein: No evidence of thrombus. Normal compressibility, respiratory phasicity and response to augmentation. IMPRESSION: Deep vein thrombosis limited to the right popliteal and femoral vein which appears mostly chronic however there is a short segment of acute appearing thrombus in the peripheral femoral vein. No evidence of iliocaval extension. Ester Sides, MD [facility] Electronically Signed   By: Ester Sides M.D.   On: 12/02/2023 21:47   DG Toe 2nd Right Result  Date: 12/02/2023 CLINICAL DATA:  Swelling, question osteomyelitis EXAM: RIGHT SECOND TOE COMPARISON:  None Available. FINDINGS: There is bone destruction at the tip of the right 2nd toe distal phalanx compatible with osteomyelitis. Overlying soft tissue swelling. No subluxation or dislocation. No fracture. IMPRESSION: Bone destruction at the tip of the right 2nd toe distal phalanx compatible with osteomyelitis. Electronically Signed   By: Franky Crease M.D.   On: 12/02/2023 00:04   DG ESOPHAGUS W SINGLE CM (SOL OR THIN BA) Result Date: 11/26/2023 CLINICAL DATA:  Patient with history of GERD, chronic dysphagia for approximately 10 years, globus sensation in the upper throat. Request for barium swallow for further assessment. EXAM: ESOPHAGUS/BARIUM SWALLOW/TABLET STUDY TECHNIQUE: Single contrast examination was performed using thin liquid barium. This exam was performed by Clotilda Hesselbach, PA-C, and was supervised and interpreted by Wilkie Lent, MD. FLUOROSCOPY: Radiation Exposure Index (as provided by the fluoroscopic device): 19.90 mGy Kerma COMPARISON:  Esophagram 05/11/2016 FINDINGS: Swallowing: Appears normal. No vestibular penetration or aspiration seen. 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 Esophagus: Normal appearance.  No masses significant stricture. Esophageal motility: Mild lower esophageal dysmotility. Hiatal Hernia: None. Gastroesophageal reflux: No spontaneous gastroesophageal reflux. Ingested 13 mm barium tablet: Passed normally Other: None. IMPRESSION: Single-contrast esophagram significant for: 1. 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 2.  Mild lower esophageal dysmotility. Electronically Signed   By: Wilkie Lent M.D.   On: 11/26/2023 11:17    Microbiology: Results for orders placed or performed during the hospital encounter of 12/01/23  Aerobic Culture w Gram Stain (superficial specimen)     Status: None (Preliminary result)   Collection Time: 12/03/23 10:35 AM   Specimen: Wound  Result Value Ref Range Status   Specimen Description WOUND  Final   Special Requests TOE  Final   Gram Stain NO WBC SEEN NO ORGANISMS SEEN   Final   Culture   Final    FEW STAPHYLOCOCCUS EPIDERMIDIS SUSCEPTIBILITIES TO FOLLOW Performed at [facility], [address] [place], [place] [address]    Report Status PENDING  Incomplete  Surgical PCR screen     Status: None   Collection Time: 12/05/23  4:32 AM   Specimen: Nasal Mucosa; Nasal Swab  Result Value Ref Range Status   MRSA, PCR NEGATIVE NEGATIVE Final   Staphylococcus aureus NEGATIVE NEGATIVE Final    Comment: (NOTE) The Xpert SA Assay (FDA approved for NASAL specimens in patients 5 years of age and older), is one component of a comprehensive surveillance program. It is not intended to diagnose infection nor to guide or monitor treatment. Performed at [facility], [address] [place], [place] [address]     Labs: CBC: Recent Labs  Lab 12/01/23 2225 12/02/23 0529 12/03/23 0517 12/04/23 0353 12/05/23 0531 12/06/23 0258  WBC 6.8 7.0 6.5 6.9 7.5 9.2  NEUTROABS 4.7  --   --   --   --   --   HGB 13.9  13.0 13.0 13.3 14.7 13.1  HCT 41.1 38.7* 38.4* 38.5* 42.8 38.5*  MCV 92.2 91.9 91.0 90.2 88.2 89.3  PLT 198 181 178 183 206 176   Basic Metabolic Panel: Recent Labs  Lab 12/02/23 0529 12/03/23 0517 12/04/23 0353 12/05/23 0531 12/06/23 0258  NA 141 141 142 139 139  K 4.0 4.0 4.1 4.2 4.2  CL 105 106 102 106 103  CO2 28 27 27  21* 24  GLUCOSE 238* 110* 105* 93 90  BUN 21 21 23 20 20   CREATININE 2.07* 2.26* 2.17* 2.01* 2.06*  CALCIUM  8.5* 8.6* 8.7* 9.1 8.9  MG 2.3  --   --   --   --   PHOS 2.5  --   --   --   --    Liver Function Tests: Recent Labs  Lab 12/02/23 0529  AST 12*  ALT 11  ALKPHOS 61  BILITOT 0.6  PROT 6.5  ALBUMIN 3.1*   CBG: Recent Labs  Lab 12/05/23 1659 12/05/23 2051 12/06/23 0550 12/06/23 0826 12/06/23 1133  GLUCAP 100* 113* 91 86 131*    Discharge time spent: greater than 30 minutes.  Signed: Adriana DELENA Grams, MD [facility] 12/06/2023

## 2023-12-06 NOTE — Consult Note (Signed)
 WOC Nurse Consult Note: Reason for Consult: right 2nd toe vascular/ostomylitis wound reccomnedations  Wound type: PAD; followed by VVS Asked VVS PA to address wound care  Pressure Injury POA: NA Measurement: see nursing flow sheets Wound bed: see nursing flowsheets Drainage (amount, consistency, odor) see nursing flow sheets  Periwound:intact  Dressing procedure/placement/frequency: Per VVS; paint with betadine  daily and keep dry.   Dis Re consult if needed, will not follow at this time. Thanks  Ermine Stebbins [employer], RN,CWOCN, CNS, [employer] [phone]

## 2023-12-06 NOTE — Consult Note (Signed)
 [facility]    Date of Admission:  12/01/2023         Assessment: 83 yo male dm2, htn, gout, dvt on xarelto , admitted for 1 week swelling right foot and 2nd toe chronic wound, found to have mri osseous erosion 2nd toe tip and cellulitis   Patient evaluated by vascular surgery too for decreased abi right LE. Underwent angiogram rle for concern of critical limb ischemia and gangrenous right toe, with balloon angioplasty of perioneal and posterior tibial artery  He didn't have any sepsis There was a 9/22 wound swab superficially that grows staph epi  Dvt chronic was found on rle and he is taking xarelto  already   Mri is nonspecific and this could be gangrene with will demarcate and autoamputate   We'll plan to treat for cellulitis  Plan: Finish total 10 day treatment and can continue with cefadroxil  1000 mg po bid Maintain standard isolation precaution Discharge ok from id standpoint Discussed with primary team/vascular      ------------------------------------------------ Principal Problem:   Osteomyelitis of second toe of right foot (HCC) Active Problems:   Essential hypertension   GERD (gastroesophageal reflux disease)   Type 2 diabetes mellitus with hyperglycemia (HCC)   CKD (chronic kidney disease), stage IV (HCC)   Swelling of right lower extremity    HPI: Hector Rose is a 84 y.o. male here with rle cellulitis and osseus erosion on mri imaging 2nd toe right foot  Angiogram with angioplasty done rle No amputation plan No pain No fever, chill No sepsis this admision U/s showed chronic dvt rle -- already on xarelto   No other complaint    History reviewed. No pertinent family history.  Social History   Tobacco Use   Smoking status: Never   Smokeless tobacco: Never  Vaping Use   Vaping status: Never Used  Substance Use Topics   Alcohol use: No    Alcohol/week: 0.0 standard drinks of alcohol   Drug use: No     Allergies  Allergen Reactions   Ace Inhibitors Swelling    Angio edema (swelling of lips) Swelling around face Swelling in mouth   Allopurinol  Itching    Makes feet fill numb and tingle Pt stated he is not allergic to this 12-02-2023   Metformin  And Related Itching    Itching in higher doses    Review of Systems: ROS All Other ROS was negative, except mentioned above   Past Medical History:  Diagnosis Date   Asthma 04-11-11   AS CHILD ONLY   BPH (benign prostatic hypertrophy) with urinary obstruction    Carpal tunnel syndrome    patient denies at 05/14/17 preop visit    Difficult intubation 04-11-11   08-11-97 some issues with intubation/record with chart   DJD (degenerative joint disease)    DM (diabetes mellitus) (HCC) 04-11-11   tyep II    DVT femoral (deep venous thrombosis) with thrombophlebitis (HCC) 04-11-11   ?'09-tx. on Xarelton x 3-4 years    GERD (gastroesophageal reflux disease) 04-11-11   mild, no med in 1 month   Gout 04-11-11    ankle 2 yrs ago   Hemorrhoids    Hypercholesteremia    Hypertension    Internal hemorrhoids    Keloid 04-11-11   multiple-arms,back, chest   Pulmonary nodule 04-11-11   PT. NOT AWARE -denies problems with breathing   Renal calculus    Thyroid  disease    hypothyroid  Scheduled Meds:  amLODipine   10 mg Oral q morning   atorvastatin   80 mg Oral Daily   cefadroxil   500 mg Oral BID   clopidogrel   75 mg Oral Q breakfast   donepezil   5 mg Oral QHS   insulin  aspart  0-9 Units Subcutaneous TID WC   metoprolol  tartrate  25 mg Oral BID   mupirocin  ointment  1 Application Nasal BID   pantoprazole   40 mg Oral Daily   rivaroxaban   20 mg Oral Daily   sodium chloride  flush  3 mL Intravenous Q12H   Continuous Infusions:  sodium chloride  Stopped (12/05/23 1559)   sodium chloride      PRN Meds:.sodium chloride , acetaminophen , hydrALAZINE , labetalol , ondansetron  **OR** ondansetron  (ZOFRAN ) IV, sodium chloride   flush   OBJECTIVE: Blood pressure 131/67, pulse 68, temperature 98.1 F (36.7 C), temperature source Oral, resp. rate (!) 21, height 6' 1 (1.854 m), weight 84.5 kg, SpO2 100%.  Physical Exam General/constitutional: no distress, pleasant HEENT: Normocephalic, PER, Conj Clear, EOMI, Oropharynx clear Neck supple CV: rrr no mrg Lungs: clear to auscultation, normal respiratory effort Abd: Soft, Nontender Skin: No Rash Neuro: nonfocal MSK: rle swelling more than left; no tenderness; right distal 2nd toe open wound no purulence    Lab Results Lab Results  Component Value Date   WBC 9.2 12/06/2023   HGB 13.1 12/06/2023   HCT 38.5 (L) 12/06/2023   MCV 89.3 12/06/2023   PLT 176 12/06/2023    Lab Results  Component Value Date   CREATININE 2.06 (H) 12/06/2023   BUN 20 12/06/2023   NA 139 12/06/2023   K 4.2 12/06/2023   CL 103 12/06/2023   CO2 24 12/06/2023    Lab Results  Component Value Date   ALT 11 12/02/2023   AST 12 (L) 12/02/2023   ALKPHOS 61 12/02/2023   BILITOT 0.6 12/02/2023      Microbiology: Recent Results (from the past 240 hours)  Aerobic Culture w Gram Stain (superficial specimen)     Status: None (Preliminary result)   Collection Time: 12/03/23 10:35 AM   Specimen: Wound  Result Value Ref Range Status   Specimen Description WOUND  Final   Special Requests TOE  Final   Gram Stain NO WBC SEEN NO ORGANISMS SEEN   Final   Culture   Final    FEW STAPHYLOCOCCUS EPIDERMIDIS SUSCEPTIBILITIES TO FOLLOW Performed at [facility], [address] [place], [place] [address]    Report Status PENDING  Incomplete  Surgical PCR screen     Status: None   Collection Time: 12/05/23  4:32 AM   Specimen: Nasal Mucosa; Nasal Swab  Result Value Ref Range Status   MRSA, PCR NEGATIVE NEGATIVE Final   Staphylococcus aureus NEGATIVE NEGATIVE Final    Comment: (NOTE) The Xpert SA Assay (FDA approved for NASAL specimens in patients 20 years of age and older), is  one component of a comprehensive surveillance program. It is not intended to diagnose infection nor to guide or monitor treatment. Performed at [facility], [address], [place], [place] [address]      Serology:    Imaging: If present, new imagings (plain films, ct scans, and mri) have been personally visualized and interpreted; radiology reports have been reviewed. Decision making incorporated into the Impression / Recommendations.  12/03/23 abi 1. Right lower extremity: Borderline abnormal right-sided ABI. Degraded waveform in the anterior tibial territory which suggests moderate disease in that territory. 2. Left lower extremity: ABI is  within normal limits. Mild peripheral arterial disease is suspected based on waveform analysis.  9/21 duplex u/s lower ext Deep vein thrombosis limited to the right popliteal and femoral vein which appears mostly chronic however there is a short segment of acute appearing thrombus in the peripheral femoral vein. No evidence of iliocaval extension.   9/21 mri right foot FINDINGS:   There is a wound at the tip of the second toe. There is destruction of the distal phalanx of the second toe consistent with acute osteomyelitis. There is mild edema in the second middle phalanx suggesting involvement. There is no interphalangeal effusion present. No obvious involvement of the proximal phalanx. Otherwise, the bones of the forefoot demonstrate mild degenerative change without marrow edema. Mild degenerative changes in the midfoot.   Moderate subcutaneous edema is identified in the dorsum of the foot and in the second toe. There is no significant tenosynovitis or tendinosis. No drainable collection.     IMPRESSION: Osteomyelitis of the second toe as above. There is a wound at the tip of the second toe which communicates with the bone. No obvious involvement of the proximal phalanx.   Moderate dorsal subcutaneous edema without  drainable collection.   Constance ONEIDA Passer, MD [facility] [phone] pager    12/06/2023, 3:14 PM

## 2023-12-06 NOTE — Progress Notes (Addendum)
  Progress Note    12/06/2023 7:33 AM 1 Day Post-Op  Subjective:  no complaints   Vitals:   12/05/23 2345 12/06/23 0314  BP: (!) 149/77 (!) 148/76  Pulse:    Resp: 20 20  Temp: 98.9 F (37.2 C) 98.3 F (36.8 C)  SpO2:      Physical Exam: General:  laying in bed, NAD Cardiac:  regular Lungs:  nonlabored Incisions:  L CFA access site soft without hematoma Extremities:  brisk right peroneal and PT doppler signals  CBC    Component Value Date/Time   WBC 9.2 12/06/2023 0258   RBC 4.31 12/06/2023 0258   HGB 13.1 12/06/2023 0258   HCT 38.5 (L) 12/06/2023 0258   PLT 176 12/06/2023 0258   MCV 89.3 12/06/2023 0258   MCH 30.4 12/06/2023 0258   MCHC 34.0 12/06/2023 0258   RDW 12.0 12/06/2023 0258   LYMPHSABS 1.2 12/01/2023 2225   MONOABS 0.6 12/01/2023 2225   EOSABS 0.2 12/01/2023 2225   BASOSABS 0.0 12/01/2023 2225    BMET    Component Value Date/Time   NA 139 12/06/2023 0258   NA 143 04/17/2023 0854   K 4.2 12/06/2023 0258   CL 103 12/06/2023 0258   CO2 24 12/06/2023 0258   GLUCOSE 90 12/06/2023 0258   BUN 20 12/06/2023 0258   BUN 26 04/17/2023 0854   CREATININE 2.06 (H) 12/06/2023 0258   CREATININE 2.06 (H) 06/27/2019 1001   CALCIUM  8.9 12/06/2023 0258   GFRNONAA 32 (L) 12/06/2023 0258   GFRNONAA 33 (L) 02/26/2019 1510   GFRAA 35 (L) 10/29/2019 1502   GFRAA 38 (L) 02/26/2019 1510    INR    Component Value Date/Time   INR 1.00 04/14/2011 1350     Intake/Output Summary (Last 24 hours) at 12/06/2023 9266 Last data filed at 12/05/2023 2000 Gross per 24 hour  Intake 358 ml  Output --  Net 358 ml      Assessment/Plan:  83 y.o. male is 1 day post op, s/p: RLE angiogram with balloon angioplasty of the peroneal and posterior tibial artery   -He is doing well this morning without any complaints. He denies any pain -L CFA access site soft without hematoma. Hgb stable at 13.1 -RLE is maximally revascularized with brisk PT and peroneal doppler  signals -Continue local wound care to right 2nd toe and outpatient follow up with ortho -Okay for discharge from vascular perspective. Continue asa and statin. Will arrange follow up in 4 weeks   Ahmed Holster, PA-C [facility] [phone] 12/06/2023 7:33 AM   VASCULAR STAFF ADDENDUM: I have independently interviewed and examined the patient. I agree with the above.  Palpable on exam today.  He has been maximally revascularized.  Fonda FORBES Rim MD [facility] Phone Number: [phone] 12/06/2023 11:37 AM

## 2023-12-06 NOTE — TOC Transition Note (Signed)
 Transition of Care (TOC) - Discharge Note Rayfield Gobble RN, BSN Inpatient Care Management Unit 4E- RN Case Manager See Treatment Team for direct phone #   Patient Details  Name: Hector Rose MRN: [id number] Date of Birth: [birth year]  Transition of Care [facility]) CM/SW Contact:  Gobble Rayfield Hurst, RN Phone Number: 12/06/2023, 1:09 PM   Clinical Narrative:    Pt stable for transition home today, order placed for [facility].   CM in to speak with pt at bedside, pt voiced his wife would come transport him home, meds to be filled at [facility] pharmacy to pick up prior to leaving.   Discussed HH needs- pt agreeable to services. States he has had HH in past but can not remember name of agency- list provided for [facility] choice Per CMS guidelines from [email or web address] website with star ratings (copy placed in shadow chart)- per review in [facility]- no HH history info noted. Pt voiced he does not have a preference for provider.   Pt declined any DME needs.   Call made to [facility] liaison- referral has been accepted for [facility]. They will contact pt within 48hr post discharge for start of care.   No further Inpt CM needs noted.    Final next level of care: Home w Home Health Services Barriers to Discharge: Barriers Resolved   Patient Goals and CMS Choice Patient states their goals for this hospitalization and ongoing recovery are:: return home CMS Medicare.gov Compare Post Acute Care list provided to:: Patient Choice offered to / list presented to : Patient      Discharge Placement               Home w/ [facility]        Discharge Plan and Services Additional resources added to the After Visit Summary for     Discharge Planning Services: CM Consult Post Acute Care Choice: Home Health          DME Arranged: N/A DME Agency: NA       HH Arranged: RN HH Agency: [facility] Date HH Agency Contacted: 12/06/23 Time HH Agency Contacted: 1307 Representative spoke with at [facility] Agency:  [place]  Social Drivers of Health (SDOH) Interventions SDOH Screenings   Food Insecurity: No Food Insecurity (12/02/2023)  Housing: Low Risk  (12/02/2023)  Transportation Needs: No Transportation Needs (12/02/2023)  Utilities: Not At Risk (12/02/2023)  Social Connections: Socially Integrated (12/02/2023)  Tobacco Use: Low Risk  (12/01/2023)  Health Literacy: Medium Risk (11/04/2020)   Received from [facility]     Readmission Risk Interventions    12/06/2023    1:08 PM  Readmission Risk Prevention Plan  Post Dischage Appt Complete  Medication Screening Complete  Transportation Screening Complete

## 2023-12-07 LAB — AEROBIC CULTURE W GRAM STAIN (SUPERFICIAL SPECIMEN): Gram Stain: NONE SEEN

## 2023-12-11 DIAGNOSIS — M7989 Other specified soft tissue disorders: Secondary | ICD-10-CM | POA: Diagnosis not present

## 2023-12-11 DIAGNOSIS — E1165 Type 2 diabetes mellitus with hyperglycemia: Secondary | ICD-10-CM | POA: Diagnosis not present

## 2023-12-11 DIAGNOSIS — N184 Chronic kidney disease, stage 4 (severe): Secondary | ICD-10-CM | POA: Diagnosis not present

## 2023-12-11 DIAGNOSIS — M869 Osteomyelitis, unspecified: Secondary | ICD-10-CM | POA: Diagnosis not present

## 2023-12-11 DIAGNOSIS — I10 Essential (primary) hypertension: Secondary | ICD-10-CM | POA: Diagnosis not present

## 2023-12-11 DIAGNOSIS — Z6826 Body mass index (BMI) 26.0-26.9, adult: Secondary | ICD-10-CM | POA: Diagnosis not present

## 2023-12-11 DIAGNOSIS — K219 Gastro-esophageal reflux disease without esophagitis: Secondary | ICD-10-CM | POA: Diagnosis not present

## 2023-12-18 DIAGNOSIS — Z794 Long term (current) use of insulin: Secondary | ICD-10-CM | POA: Diagnosis not present

## 2023-12-18 DIAGNOSIS — N1831 Chronic kidney disease, stage 3a: Secondary | ICD-10-CM | POA: Diagnosis not present

## 2023-12-18 DIAGNOSIS — E1122 Type 2 diabetes mellitus with diabetic chronic kidney disease: Secondary | ICD-10-CM | POA: Diagnosis not present

## 2023-12-19 LAB — T4, FREE: Free T4: 1.31 ng/dL (ref 0.82–1.77)

## 2023-12-19 LAB — COMPREHENSIVE METABOLIC PANEL WITH GFR
ALT: 14 IU/L (ref 0–44)
AST: 16 IU/L (ref 0–40)
Albumin: 4.4 g/dL (ref 3.7–4.7)
Alkaline Phosphatase: 95 IU/L (ref 48–129)
BUN/Creatinine Ratio: 15 (ref 10–24)
BUN: 30 mg/dL — ABNORMAL HIGH (ref 8–27)
Bilirubin Total: 0.7 mg/dL (ref 0.0–1.2)
CO2: 23 mmol/L (ref 20–29)
Calcium: 9.6 mg/dL (ref 8.6–10.2)
Chloride: 101 mmol/L (ref 96–106)
Creatinine, Ser: 2.06 mg/dL — ABNORMAL HIGH (ref 0.76–1.27)
Globulin, Total: 2.8 g/dL (ref 1.5–4.5)
Glucose: 115 mg/dL — ABNORMAL HIGH (ref 70–99)
Potassium: 3.5 mmol/L (ref 3.5–5.2)
Sodium: 140 mmol/L (ref 134–144)
Total Protein: 7.2 g/dL (ref 6.0–8.5)
eGFR: 32 mL/min/1.73 — ABNORMAL LOW (ref >59)

## 2023-12-19 LAB — LIPID PANEL
Chol/HDL Ratio: 3.5 ratio (ref 0.0–5.0)
Cholesterol, Total: 149 mg/dL (ref 100–199)
HDL: 42 mg/dL (ref >39)
LDL Chol Calc (NIH): 86 mg/dL (ref 0–99)
Triglycerides: 114 mg/dL (ref 0–149)
VLDL Cholesterol Cal: 21 mg/dL (ref 5–40)

## 2023-12-19 LAB — TSH: TSH: 0.822 u[IU]/mL (ref 0.450–4.500)

## 2023-12-21 NOTE — Progress Notes (Incomplete)
 Reason for Infectious Disease Consult:  Requesting Physician: Adriana Eye, MD  Subjective:    Patient ID: Hector Rose, male    DOB: 1940-10-07, 83 y.o.   MRN: [id number]  HPI  Past Medical History:  Diagnosis Date  . Asthma 04-11-11   AS CHILD ONLY  . BPH (benign prostatic hypertrophy) with urinary obstruction   . Carpal tunnel syndrome    patient denies at 05/14/17 preop visit   . Difficult intubation 04-11-11   08-11-97 some issues with intubation/record with chart  . DJD (degenerative joint disease)   . DM (diabetes mellitus) (HCC) 04-11-11   tyep II   . DVT femoral (deep venous thrombosis) with thrombophlebitis (HCC) 04-11-11   ?'09-tx. on Xarelton x 3-4 years   . GERD (gastroesophageal reflux disease) 04-11-11   mild, no med in 1 month  . Gout 04-11-11    ankle 2 yrs ago  . Hemorrhoids   . Hypercholesteremia   . Hypertension   . Internal hemorrhoids   . Keloid 04-11-11   multiple-arms,back, chest  . Pulmonary nodule 04-11-11   PT. NOT AWARE -denies problems with breathing  . Renal calculus   . Thyroid  disease    hypothyroid    Past Surgical History:  Procedure Laterality Date  . ABDOMINAL AORTOGRAM W/LOWER EXTREMITY N/A 12/05/2023   Procedure: ABDOMINAL AORTOGRAM W/LOWER EXTREMITY;  Surgeon: Lanis Fonda BRAVO, MD;  Location[facility] INVASIVE CV LAB;  Service: Cardiovascular;  Laterality: N/A;  . CATARACT EXTRACTION W/PHACO Left 10/28/2021   Procedure: CATARACT EXTRACTION PHACO AND INTRAOCULAR LENS PLACEMENT (IOC);  Surgeon: Harrie Agent, MD;  Location: [facility] ORS;  Service: Ophthalmology;  Laterality: Left;  CDE: 6.61  . cataract surgery  12/2013  . COLONOSCOPY WITH PROPOFOL  N/A 02/05/2017   Procedure: COLONOSCOPY WITH PROPOFOL ;  Surgeon: Aneita Gwendlyn DASEN, MD;  Location: [facility] ENDOSCOPY;  Service: Endoscopy;  Laterality: N/A;  . COSMETIC SURGERY  2000   Keloid injections  . CYSTOSCOPY WITH BIOPSY  04/14/2011   Procedure: CYSTOSCOPY WITH BIOPSY;  Surgeon: Noretta Ferrara, MD;   Location: [facility] ORS;  Service: Urology;  Laterality: N/A;  Bladder Biopsies   . CYSTOSCOPY/RETROGRADE/URETEROSCOPY  04/14/2011   Procedure: CYSTOSCOPY/RETROGRADE/URETEROSCOPY;  Surgeon: Noretta Ferrara, MD;  Location: [facility] ORS;  Service: Urology;  Laterality: Bilateral;  (Bil) RPG   . LAPAROSCOPIC NEPHRECTOMY Right 05/21/2017   Procedure: LAPAROSCOPIC RADICAL NEPHRECTOMY;  Surgeon: Ferrara Glance, MD;  Location: [facility] ORS;  Service: Urology;  Laterality: Right;  . LOWER EXTREMITY INTERVENTION  12/05/2023   Procedure: LOWER EXTREMITY INTERVENTION;  Surgeon: Lanis Fonda BRAVO, MD;  Location[facility] INVASIVE CV LAB;  Service: Cardiovascular;;  . ortho surgery on fingers  2005 & 2008   Dr. Jearld finger release  . PROSTATECTOMY  1999   Dr. Nicholaus    No family history on file.    Social History   Socioeconomic History  . Marital status: Married    Spouse name: Not on file  . Number of children: 2  . Years of education: Not on file  . Highest education level: Not on file  Occupational History  . Occupation: retired from [employer]  Tobacco Use  . Smoking status: Never  . Smokeless tobacco: Never  Vaping Use  . Vaping status: Never Used  Substance and Sexual Activity  . Alcohol use: No    Alcohol/week: 0.0 standard drinks of alcohol  . Drug use: No  . Sexual activity: Yes  Other Topics Concern  . 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 Worried About Programme researcher, broadcasting/film/video in the Last Year: Never true   . Ran Out of Food in the Last Year: Never true  Transportation Needs: No Transportation Needs (12/02/2023)   PRAPARE - Transportation   . Lack of Transportation (Medical): No   . Lack of Transportation (Non-Medical): No  Physical Activity: Not on file  Stress: Not on file  Social Connections: Socially Integrated (12/02/2023)    Social Connection and Isolation Panel   . Frequency of Communication with Friends and Family: More than three times a week   . Frequency of Social Gatherings with Friends and Family: Three times a week   . Attends Religious Services: More than 4 times per year   . Active Member of Clubs or Organizations: Yes   . Attends Banker Meetings: More than 4 times per year   . Marital Status: Married    Allergies  Allergen Reactions  . Ace Inhibitors Swelling    Angio edema (swelling of lips) Swelling around face Swelling in mouth  . Allopurinol  Itching    Makes feet fill numb and tingle Pt stated he is not allergic to this 12-02-2023  . Metformin  And Related Itching    Itching in higher doses     Current Outpatient Medications:  .  amLODipine  (NORVASC ) 10 MG tablet, Take 1 tablet (10 mg total) by mouth every morning., Disp: 90 tablet, Rfl: 0 .  atorvastatin  (LIPITOR ) 80 MG tablet, Take 1 tablet (80 mg total) by mouth daily., Disp: 90 tablet, Rfl: 0 .  Blood Glucose Monitoring Suppl (ACCU-CHEK GUIDE ME) w/Device KIT, 1 Piece by Does not apply route as directed., Disp: 1 kit, Rfl: 0 .  clopidogrel  (PLAVIX ) 75 MG tablet, Take 1 tablet (75 mg total) by mouth daily with breakfast., Disp: 30 tablet, Rfl: 1 .  Continuous Blood Gluc Receiver (FREESTYLE LIBRE 2 READER) DEVI, As directed, Disp: 1 each, Rfl: 0 .  Continuous Glucose Sensor (FREESTYLE LIBRE 2 SENSOR) MISC, APPLY NEW SENSOR EVERY 14 (FOURTEEN) DAYS., Disp: 2 each, Rfl: 2 .  diphenhydrAMINE  (BENADRYL ) 25 MG tablet, Take 25 mg by mouth daily as needed for allergies., Disp: , Rfl:  .  donepezil  (ARICEPT ) 5 MG tablet, Take 5 mg by mouth at bedtime., Disp: , Rfl:  .  fluticasone  (FLONASE ) 50 MCG/ACT nasal spray, Place 2 sprays daily as needed into both nostrils for allergies or rhinitis., Disp: , Rfl:  .  JARDIANCE  10 MG TABS tablet, TAKE 1 TABLET BY MOUTH EVERY DAY, Disp: 90 tablet, Rfl: 1 .  metoprolol  tartrate (LOPRESSOR ) 25  MG tablet, Take 25 mg by mouth 2 (two) times daily., Disp: , Rfl:  .  pantoprazole  (PROTONIX ) 40 MG tablet, Take 1 tablet (40 mg total) by mouth daily., Disp: 30 tablet, Rfl: 2 .  XARELTO  20 MG TABS tablet, Take 20 mg by mouth daily., Disp: , Rfl:    Review of Systems     Objective:   Physical Exam        Assessment & Plan:

## 2023-12-21 NOTE — Progress Notes (Unsigned)
 Subjective:  Chief complaint: follow-up for osteomyelitis   Patient ID: Hector Rose, male    DOB: [birth year], 83 y.o.   MRN: [id number]  HPI  Past Medical History:  Diagnosis Date   Asthma 04-11-11   AS CHILD ONLY   BPH (benign prostatic hypertrophy) with urinary obstruction    Carpal tunnel syndrome    patient denies at 05/14/17 preop visit    Difficult intubation 04-11-11   08-11-97 some issues with intubation/record with chart   DJD (degenerative joint disease)    DM (diabetes mellitus) (HCC) 04-11-11   tyep II    DVT femoral (deep venous thrombosis) with thrombophlebitis (HCC) 04-11-11   ?'09-tx. on Xarelton x 3-4 years    GERD (gastroesophageal reflux disease) 04-11-11   mild, no med in 1 month   Gout 04-11-11    ankle 2 yrs ago   Hemorrhoids    Hypercholesteremia    Hypertension    Internal hemorrhoids    Keloid 04-11-11   multiple-arms,back, chest   Pulmonary nodule 04-11-11   PT. NOT AWARE -denies problems with breathing   Renal calculus    Thyroid  disease    hypothyroid    Past Surgical History:  Procedure Laterality Date   ABDOMINAL AORTOGRAM W/LOWER EXTREMITY N/A 12/05/2023   Procedure: ABDOMINAL AORTOGRAM W/LOWER EXTREMITY;  Surgeon: Lanis Fonda BRAVO, MD;  Location: [facility] INVASIVE CV LAB;  Service: Cardiovascular;  Laterality: N/A;   CATARACT EXTRACTION W/PHACO Left 10/28/2021   Procedure: CATARACT EXTRACTION PHACO AND INTRAOCULAR LENS PLACEMENT (IOC);  Surgeon: Harrie Agent, MD;  Location: [facility];  Service: Ophthalmology;  Laterality: Left;  CDE: 6.61   cataract surgery  12/2013   COLONOSCOPY WITH PROPOFOL  N/A 02/05/2017   Procedure: COLONOSCOPY WITH PROPOFOL ;  Surgeon: Aneita Gwendlyn DASEN, MD;  Location: [facility];  Service: Endoscopy;  Laterality: N/A;   COSMETIC SURGERY  2000   Keloid injections   CYSTOSCOPY WITH BIOPSY  04/14/2011   Procedure: CYSTOSCOPY WITH BIOPSY;  Surgeon: Noretta Ferrara, MD;  Location: [facility];  Service: Urology;  Laterality: N/A;  Bladder  Biopsies    CYSTOSCOPY/RETROGRADE/URETEROSCOPY  04/14/2011   Procedure: CYSTOSCOPY/RETROGRADE/URETEROSCOPY;  Surgeon: Noretta Ferrara, MD;  Location: [facility];  Service: Urology;  Laterality: Bilateral;  (Bil) RPG    LAPAROSCOPIC NEPHRECTOMY Right 05/21/2017   Procedure: LAPAROSCOPIC RADICAL NEPHRECTOMY;  Surgeon: Ferrara Glance, MD;  Location: [facility];  Service: Urology;  Laterality: Right;   LOWER EXTREMITY INTERVENTION  12/05/2023   Procedure: LOWER EXTREMITY INTERVENTION;  Surgeon: Lanis Fonda BRAVO, MD;  Location: [facility] INVASIVE CV LAB;  Service: Cardiovascular;;   ortho surgery on fingers  2005 & 2008   Dr. Jearld finger release   PROSTATECTOMY  1999   Dr. Nicholaus    No family history on file.    Social History   Socioeconomic History   Marital status: Married    Spouse name: Not on file   Number of children: 2   Years of education: Not on file   Highest education level: Not on file  Occupational History   Occupation: retired from [employer]  Tobacco Use   Smoking status: Never   Smokeless tobacco: Never  Vaping Use   Vaping status: Never Used  Substance and Sexual Activity   Alcohol use: No    Alcohol/week: 0.0 standard drinks of alcohol   Drug use: No   Sexual activity: Yes  Other Topics Concern   Not on file  Social History Narrative   Right handed    Retired  Social Drivers of Corporate investment banker Strain: Not on file  Food Insecurity: No Food Insecurity (12/02/2023)   Hunger Vital Sign    Worried About Running Out of Food in the Last Year: Never true    Ran Out of Food in the Last Year: Never true  Transportation Needs: No Transportation Needs (12/02/2023)   PRAPARE - Administrator, Civil Service (Medical): No    Lack of Transportation (Non-Medical): No  Physical Activity: Not on file  Stress: Not on file  Social Connections: Socially Integrated (12/02/2023)   Social Connection and Isolation Panel    Frequency of Communication with Friends  and Family: More than three times a week    Frequency of Social Gatherings with Friends and Family: Three times a week    Attends Religious Services: More than 4 times per year    Active Member of Clubs or Organizations: Yes    Attends Banker Meetings: More than 4 times per year    Marital Status: Married    Allergies  Allergen Reactions   Ace Inhibitors Swelling    Angio edema (swelling of lips) Swelling around face Swelling in mouth   Allopurinol  Itching    Makes feet fill numb and tingle Pt stated he is not allergic to this 12-02-2023   Metformin  And Related Itching    Itching in higher doses     Current Outpatient Medications:    amLODipine  (NORVASC ) 10 MG tablet, Take 1 tablet (10 mg total) by mouth every morning., Disp: 90 tablet, Rfl: 0   atorvastatin  (LIPITOR ) 80 MG tablet, Take 1 tablet (80 mg total) by mouth daily., Disp: 90 tablet, Rfl: 0   Blood Glucose Monitoring Suppl (ACCU-CHEK GUIDE ME) w/Device KIT, 1 Piece by Does not apply route as directed., Disp: 1 kit, Rfl: 0   clopidogrel  (PLAVIX ) 75 MG tablet, Take 1 tablet (75 mg total) by mouth daily with breakfast., Disp: 30 tablet, Rfl: 1   Continuous Blood Gluc Receiver (FREESTYLE LIBRE 2 READER) DEVI, As directed, Disp: 1 each, Rfl: 0   Continuous Glucose Sensor (FREESTYLE LIBRE 2 SENSOR) MISC, APPLY NEW SENSOR EVERY 14 (FOURTEEN) DAYS., Disp: 2 each, Rfl: 2   diphenhydrAMINE  (BENADRYL ) 25 MG tablet, Take 25 mg by mouth daily as needed for allergies., Disp: , Rfl:    donepezil  (ARICEPT ) 5 MG tablet, Take 5 mg by mouth at bedtime., Disp: , Rfl:    fluticasone  (FLONASE ) 50 MCG/ACT nasal spray, Place 2 sprays daily as needed into both nostrils for allergies or rhinitis., Disp: , Rfl:    JARDIANCE  10 MG TABS tablet, TAKE 1 TABLET BY MOUTH EVERY DAY, Disp: 90 tablet, Rfl: 1   metoprolol  tartrate (LOPRESSOR ) 25 MG tablet, Take 25 mg by mouth 2 (two) times daily., Disp: , Rfl:    pantoprazole  (PROTONIX ) 40 MG  tablet, Take 1 tablet (40 mg total) by mouth daily., Disp: 30 tablet, Rfl: 2   XARELTO  20 MG TABS tablet, Take 20 mg by mouth daily., Disp: , Rfl:    Review of Systems     Objective:   Physical Exam        Assessment & Plan:

## 2023-12-24 ENCOUNTER — Ambulatory Visit (INDEPENDENT_AMBULATORY_CARE_PROVIDER_SITE_OTHER): Admitting: Orthopedic Surgery

## 2023-12-24 ENCOUNTER — Ambulatory Visit: Admitting: Infectious Disease

## 2023-12-24 ENCOUNTER — Encounter: Payer: Self-pay | Admitting: Orthopedic Surgery

## 2023-12-24 ENCOUNTER — Other Ambulatory Visit: Payer: Self-pay

## 2023-12-24 VITALS — BP 142/77 | HR 80 | Temp 97.8°F | Wt 183.0 lb

## 2023-12-24 DIAGNOSIS — N1831 Chronic kidney disease, stage 3a: Secondary | ICD-10-CM | POA: Diagnosis not present

## 2023-12-24 DIAGNOSIS — Z905 Acquired absence of kidney: Secondary | ICD-10-CM

## 2023-12-24 DIAGNOSIS — Z794 Long term (current) use of insulin: Secondary | ICD-10-CM

## 2023-12-24 DIAGNOSIS — M869 Osteomyelitis, unspecified: Secondary | ICD-10-CM | POA: Diagnosis not present

## 2023-12-24 DIAGNOSIS — I739 Peripheral vascular disease, unspecified: Secondary | ICD-10-CM

## 2023-12-24 DIAGNOSIS — E1122 Type 2 diabetes mellitus with diabetic chronic kidney disease: Secondary | ICD-10-CM

## 2023-12-24 MED ORDER — DOXYCYCLINE HYCLATE 100 MG PO TABS: 100.0000 mg | ORAL_TABLET | Freq: Two times a day (BID) | ORAL | 1 refills | Status: AC

## 2023-12-24 NOTE — Progress Notes (Signed)
 Office Visit Note   Patient: Madaline Novak           Date of Birth: 02-Sep-1940           MRN: [id number] Visit Date: 12/24/2023              Requested by: Fleeta Rothman, Jomarie SAILOR, MD [address] [place],  [place] [address] PCP: Marvine Rush, MD  Chief Complaint  Patient presents with   Right Foot - Pain      HPI: Discussed the use of AI scribe software for clinical note transcription with the patient, who gave verbal consent to proceed.  History of Present Illness Eliazar Olivar is an 83 year old male with osteomyelitis of the right foot distal phalanx who presents for evaluation. He is accompanied by his wife.  He has osteomyelitis of the right foot, specifically the distal phalanx of the second toe. He experiences swelling at the tip of the second toe on the right foot.  He underwent an endovascular intervention on December 05, 2023. His most recent hemoglobin A1c is 7.2.     Assessment & Plan: Visit Diagnoses:  1. Osteomyelitis of second toe of right foot (HCC)     Plan: Assessment and Plan Assessment & Plan Right second toe distal phalanx osteomyelitis with plantar cellulitis MRI confirmed osteomyelitis of the distal phalanx with plantar cellulitis. No drainage or ascending cellulitis. Strong dorsalis pedis pulse. - Right second toe amputation scheduled for Wednesday as outpatient. - Explained risks: wound healing complications, potential need for additional surgery. - Patient and wife consented to surgery.      Follow-Up Instructions: Return in about 2 weeks (around 01/07/2024).   Ortho Exam  Patient is alert, oriented, no adenopathy, well-dressed, normal affect, normal respiratory effort. Physical Exam CARDIOVASCULAR: Strong palpable dorsalis pedis pulse. EXTREMITIES: Swelling of the tip of the second toe, right foot. No drainage, no ascending cellulitis.   Review of the MRI scan shows osteomyelitis of the tuft of the second toe.   Imaging: No  results found. No images are attached to the encounter.  Labs: Lab Results  Component Value Date   HGBA1C 7.2 (H) 12/01/2023   HGBA1C 7.8 (A) 08/24/2023   HGBA1C 8.1 (A) 12/21/2022   ESRSEDRATE 67 (H) 12/01/2023   CRP 2.0 (H) 12/01/2023   LABURIC 6.5 03/25/2008   LABURIC 6.1 09/26/2007   LABURIC 4.9 06/05/2007   REPTSTATUS 12/07/2023 FINAL 12/03/2023   GRAMSTAIN  12/03/2023    NO WBC SEEN NO ORGANISMS SEEN Performed at [facility], [address], [place], [place] [address]    CULT FEW STAPHYLOCOCCUS EPIDERMIDIS 12/03/2023   LABORGA STAPHYLOCOCCUS EPIDERMIDIS 12/03/2023     Lab Results  Component Value Date   ALBUMIN 4.4 12/18/2023   ALBUMIN 3.1 (L) 12/02/2023   ALBUMIN 4.1 04/17/2023    Lab Results  Component Value Date   MG 2.3 12/02/2023   Lab Results  Component Value Date   VD25OH 30.7 10/29/2019   VD25OH 12 (L) 03/19/2017   VD25OH 27 (L) 03/04/2015    No results found for: PREALBUMIN    Latest Ref Rng & Units 12/06/2023    2:58 AM 12/05/2023    5:31 AM 12/04/2023    3:53 AM  CBC EXTENDED  WBC 4.0 - 10.5 K/uL 9.2  7.5  6.9   RBC 4.22 - 5.81 MIL/uL 4.31  4.85  4.27   Hemoglobin 13.0 - 17.0 g/dL 86.8  85.2  86.6   HCT 39.0 -  52.0 % 38.5  42.8  38.5   Platelets 150 - 400 K/uL 176  206  183      There is no height or weight on file to calculate BMI.  Orders:  No orders of the defined types were placed in this encounter.  No orders of the defined types were placed in this encounter.    Procedures: No procedures performed  Clinical Data: No additional findings.  ROS:  All other systems negative, except as noted in the HPI. Review of Systems  Objective: Vital Signs: There were no vitals taken for this visit.  Specialty Comments:  No specialty comments available.  PMFS History: Patient Active Problem List   Diagnosis Date Noted   Cellulitis 12/06/2023   Osteomyelitis of second toe of right foot (HCC) 12/02/2023   Type 2  diabetes mellitus with hyperglycemia (HCC) 12/02/2023   CKD (chronic kidney disease), stage IV (HCC) 12/02/2023   Swelling of right lower extremity 12/02/2023   Insulin  long-term use (HCC) 08/24/2023   Multinodular goiter 04/29/2020   S/p nephrectomy 02/04/2018   Neoplasm of right kidney 05/21/2017   Vitamin D  deficiency 03/28/2017   Special screening for malignant neoplasms, colon    Benign neoplasm of transverse colon    History of DVT (deep vein thrombosis) 07/20/2016   Renal lesion 07/20/2016   Type 2 diabetes mellitus with stage 3a chronic kidney disease, with long-term current use of insulin  (HCC) 01/15/2014   Chronic idiopathic gout of multiple sites 01/15/2014   Calcified pleural plaque on chest x-ray 06/30/2013   Tinea pedis 09/28/2011   Hematuria 05/19/2011   HEMORRHOIDS 05/29/2007   Pulmonary nodule 03/27/2007   GERD (gastroesophageal reflux disease) 03/27/2007   Benign prostatic hyperplasia with urinary obstruction 03/27/2007   KELOID 03/27/2007   Mixed hyperlipidemia 01/24/2007   CARPAL TUNNEL SYNDROME 01/24/2007   Essential hypertension 01/24/2007   RENAL CALCULUS 01/24/2007   Osteoarthritis 01/24/2007   Past Medical History:  Diagnosis Date   Asthma 04-11-11   AS CHILD ONLY   BPH (benign prostatic hypertrophy) with urinary obstruction    Carpal tunnel syndrome    patient denies at 05/14/17 preop visit    Difficult intubation 04-11-11   08-11-97 some issues with intubation/record with chart   DJD (degenerative joint disease)    DM (diabetes mellitus) (HCC) 04-11-11   tyep II    DVT femoral (deep venous thrombosis) with thrombophlebitis (HCC) 04-11-11   ?'09-tx. on Xarelton x 3-4 years    GERD (gastroesophageal reflux disease) 04-11-11   mild, no med in 1 month   Gout 04-11-11    ankle 2 yrs ago   Hemorrhoids    Hypercholesteremia    Hypertension    Internal hemorrhoids    Keloid 04-11-11   multiple-arms,back, chest   Pulmonary nodule 04-11-11   PT. NOT AWARE  -denies problems with breathing   Renal calculus    Thyroid  disease    hypothyroid    History reviewed. No pertinent family history.  Past Surgical History:  Procedure Laterality Date   ABDOMINAL AORTOGRAM W/LOWER EXTREMITY N/A 12/05/2023   Procedure: ABDOMINAL AORTOGRAM W/LOWER EXTREMITY;  Surgeon: Lanis Fonda BRAVO, MD;  Location: [facility] INVASIVE CV LAB;  Service: Cardiovascular;  Laterality: N/A;   CATARACT EXTRACTION W/PHACO Left 10/28/2021   Procedure: CATARACT EXTRACTION PHACO AND INTRAOCULAR LENS PLACEMENT (IOC);  Surgeon: Harrie Agent, MD;  Location: [facility];  Service: Ophthalmology;  Laterality: Left;  CDE: 6.61   cataract surgery  12/2013   COLONOSCOPY WITH PROPOFOL  N/A  02/05/2017   Procedure: COLONOSCOPY WITH PROPOFOL ;  Surgeon: Aneita Gwendlyn DASEN, MD;  Location: [facility];  Service: Endoscopy;  Laterality: N/A;   COSMETIC SURGERY  2000   Keloid injections   CYSTOSCOPY WITH BIOPSY  04/14/2011   Procedure: CYSTOSCOPY WITH BIOPSY;  Surgeon: Noretta Ferrara, MD;  Location: [facility];  Service: Urology;  Laterality: N/A;  Bladder Biopsies    CYSTOSCOPY/RETROGRADE/URETEROSCOPY  04/14/2011   Procedure: CYSTOSCOPY/RETROGRADE/URETEROSCOPY;  Surgeon: Noretta Ferrara, MD;  Location: [facility];  Service: Urology;  Laterality: Bilateral;  (Bil) RPG    LAPAROSCOPIC NEPHRECTOMY Right 05/21/2017   Procedure: LAPAROSCOPIC RADICAL NEPHRECTOMY;  Surgeon: Ferrara Glance, MD;  Location: [facility];  Service: Urology;  Laterality: Right;   LOWER EXTREMITY INTERVENTION  12/05/2023   Procedure: LOWER EXTREMITY INTERVENTION;  Surgeon: Lanis Fonda BRAVO, MD;  Location: [facility] INVASIVE CV LAB;  Service: Cardiovascular;;   ortho surgery on fingers  2005 & 2008   Dr. Jearld finger release   PROSTATECTOMY  1999   Dr. Nicholaus   Social History   Occupational History   Occupation: retired from [employer]  Tobacco Use   Smoking status: Never   Smokeless tobacco: Never  Vaping Use   Vaping status: Never Used  Substance and  Sexual Activity   Alcohol use: No    Alcohol/week: 0.0 standard drinks of alcohol   Drug use: No   Sexual activity: Yes

## 2023-12-25 ENCOUNTER — Other Ambulatory Visit: Payer: Self-pay

## 2023-12-25 ENCOUNTER — Encounter (HOSPITAL_COMMUNITY): Payer: Self-pay | Admitting: Orthopedic Surgery

## 2023-12-25 ENCOUNTER — Ambulatory Visit: Admitting: "Endocrinology

## 2023-12-25 ENCOUNTER — Encounter: Payer: Self-pay | Admitting: "Endocrinology

## 2023-12-25 VITALS — BP 122/58 | HR 72 | Ht 72.0 in | Wt 185.2 lb

## 2023-12-25 DIAGNOSIS — N1831 Chronic kidney disease, stage 3a: Secondary | ICD-10-CM

## 2023-12-25 DIAGNOSIS — Z794 Long term (current) use of insulin: Secondary | ICD-10-CM

## 2023-12-25 DIAGNOSIS — E782 Mixed hyperlipidemia: Secondary | ICD-10-CM

## 2023-12-25 DIAGNOSIS — E1122 Type 2 diabetes mellitus with diabetic chronic kidney disease: Secondary | ICD-10-CM

## 2023-12-25 DIAGNOSIS — I10 Essential (primary) hypertension: Secondary | ICD-10-CM

## 2023-12-25 DIAGNOSIS — M7989 Other specified soft tissue disorders: Secondary | ICD-10-CM

## 2023-12-25 LAB — BASIC METABOLIC PANEL WITHOUT GFR
BUN/Creatinine Ratio: 12 (calc) (ref 6–22)
BUN: 26 mg/dL — ABNORMAL HIGH (ref 7–25)
CO2: 29 mmol/L (ref 20–32)
Calcium: 9.7 mg/dL (ref 8.6–10.3)
Chloride: 104 mmol/L (ref 98–110)
Creat: 2.15 mg/dL — ABNORMAL HIGH (ref 0.70–1.22)
Glucose, Bld: 198 mg/dL — ABNORMAL HIGH (ref 65–99)
Potassium: 3.6 mmol/L (ref 3.5–5.3)
Sodium: 141 mmol/L (ref 135–146)

## 2023-12-25 LAB — CBC WITH DIFFERENTIAL/PLATELET
Absolute Lymphocytes: 872 {cells}/uL (ref 850–3900)
Absolute Monocytes: 500 {cells}/uL (ref 200–950)
Basophils Absolute: 39 {cells}/uL (ref 0–200)
Basophils Relative: 0.4 %
Eosinophils Absolute: 176 {cells}/uL (ref 15–500)
Eosinophils Relative: 1.8 %
HCT: 46.5 % (ref 38.5–50.0)
Hemoglobin: 15.3 g/dL (ref 13.2–17.1)
MCH: 30.1 pg (ref 27.0–33.0)
MCHC: 32.9 g/dL (ref 32.0–36.0)
MCV: 91.5 fL (ref 80.0–100.0)
MPV: 11 fL (ref 7.5–12.5)
Monocytes Relative: 5.1 %
Neutro Abs: 8212 {cells}/uL — ABNORMAL HIGH (ref 1500–7800)
Neutrophils Relative %: 83.8 %
Platelets: 184 Thousand/uL (ref 140–400)
RBC: 5.08 Million/uL (ref 4.20–5.80)
RDW: 12.9 % (ref 11.0–15.0)
Total Lymphocyte: 8.9 %
WBC: 9.8 Thousand/uL (ref 3.8–10.8)

## 2023-12-25 LAB — SEDIMENTATION RATE: Sed Rate: 29 mm/h — ABNORMAL HIGH (ref 0–20)

## 2023-12-25 LAB — C-REACTIVE PROTEIN: CRP: 10.3 mg/L — ABNORMAL HIGH (ref <8.0)

## 2023-12-25 NOTE — Progress Notes (Signed)
 12/25/2023  Endocrinology follow-up note  Subjective:    Patient ID: Hector Rose, male    DOB: 1940/07/08,    Past Medical History:  Diagnosis Date   Asthma 04/11/2011   AS CHILD ONLY   BPH (benign prostatic hypertrophy) with urinary obstruction    Carpal tunnel syndrome    patient denies at 05/14/17 preop visit    Difficult intubation 04/11/2011   08-11-97 some issues with intubation/record with chart   DJD (degenerative joint disease)    DM (diabetes mellitus) (HCC) 04/11/2011   tyep II    DVT femoral (deep venous thrombosis) with thrombophlebitis (HCC) 04/11/2011   ?'09-tx. on Xarelton x 3-4 years    GERD (gastroesophageal reflux disease) 04/11/2011   mild, no med in 1 month   Gout 04/11/2011    ankle 2 yrs ago   Hemorrhoids    Hypercholesteremia    Hypertension    Internal hemorrhoids    Keloid 04/11/2011   multiple-arms,back, chest   Pulmonary nodule 04/11/2011   PT. NOT AWARE -denies problems with breathing   Renal calculus    Thyroid  disease    hypothyroid   Past Surgical History:  Procedure Laterality Date   ABDOMINAL AORTOGRAM W/LOWER EXTREMITY N/A 12/05/2023   Procedure: ABDOMINAL AORTOGRAM W/LOWER EXTREMITY;  Surgeon: Lanis Fonda BRAVO, MD;  Location: [facility] INVASIVE CV LAB;  Service: Cardiovascular;  Laterality: N/A;   CATARACT EXTRACTION W/PHACO Left 10/28/2021   Procedure: CATARACT EXTRACTION PHACO AND INTRAOCULAR LENS PLACEMENT (IOC);  Surgeon: Harrie Agent, MD;  Location: [facility];  Service: Ophthalmology;  Laterality: Left;  CDE: 6.61   cataract surgery Right 12/11/2013   COLONOSCOPY WITH PROPOFOL  N/A 02/05/2017   Procedure: COLONOSCOPY WITH PROPOFOL ;  Surgeon: Aneita Gwendlyn DASEN, MD;  Location: [facility];  Service: Endoscopy;  Laterality: N/A;   COSMETIC SURGERY  03/13/1998   Keloid injections   CYSTOSCOPY WITH BIOPSY  04/14/2011   Procedure: CYSTOSCOPY WITH BIOPSY;  Surgeon: Noretta Ferrara, MD;  Location: [facility];  Service: Urology;  Laterality: N/A;   Bladder Biopsies    CYSTOSCOPY/RETROGRADE/URETEROSCOPY  04/14/2011   Procedure: CYSTOSCOPY/RETROGRADE/URETEROSCOPY;  Surgeon: Noretta Ferrara, MD;  Location: [facility];  Service: Urology;  Laterality: Bilateral;  (Bil) RPG    LAPAROSCOPIC NEPHRECTOMY Right 05/21/2017   Procedure: LAPAROSCOPIC RADICAL NEPHRECTOMY;  Surgeon: Ferrara Glance, MD;  Location: [facility];  Service: Urology;  Laterality: Right;   LOWER EXTREMITY INTERVENTION  12/05/2023   Procedure: LOWER EXTREMITY INTERVENTION;  Surgeon: Lanis Fonda BRAVO, MD;  Location: [facility] INVASIVE CV LAB;  Service: Cardiovascular;;   ortho surgery on fingers  2005 & 2008   Dr. Jearld finger release   PROSTATECTOMY  03/13/1997   Dr. 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: Not on [employer]  Insecurity: No Food Insecurity (12/02/2023)   Hunger Vital Sign    Worried About Running Out of Food in the Last Year: Never true    Ran Out of Food in the Last Year: Never true  Transportation Needs: No Transportation Needs (12/02/2023)   PRAPARE - Administrator, Civil Service (Medical): No    Lack of Transportation (Non-Medical): No  Physical Activity: Not on file  Stress: Not on file  Social Connections: Socially Integrated (12/02/2023)   Social Connection and Isolation Panel    Frequency of Communication with Friends and Family: More  than three times a week    Frequency of Social Gatherings with Friends and Family: Three times a week    Attends Religious Services: More than 4 times per year    Active Member of Clubs or Organizations: Yes    Attends Banker Meetings: More than 4 times per year    Marital Status: Married   Outpatient Encounter Medications as of 12/25/2023  Medication Sig   Insulin  NPH Isophane & Regular (NOVOLIN  70/30 FLEXPEN RELION [place]) Inject 5 Units into the skin 2 (two) times daily with a meal.   amLODipine  (NORVASC ) 10 MG tablet Take 1 tablet (10 mg total) by mouth every morning.   atorvastatin  (LIPITOR ) 80 MG tablet Take 1 tablet (80 mg total) by mouth daily.   Blood Glucose Monitoring Suppl (ACCU-CHEK GUIDE ME) w/Device KIT 1 Piece by Does not apply route as directed.   clopidogrel  (PLAVIX ) 75 MG tablet Take 1 tablet (75 mg total) by mouth daily with breakfast.   Continuous Blood Gluc Receiver (FREESTYLE LIBRE 2 READER) DEVI As directed   Continuous Glucose Sensor (FREESTYLE LIBRE 2 SENSOR) MISC APPLY NEW SENSOR EVERY 14 (FOURTEEN) DAYS.   cyanocobalamin 1000 MCG tablet Take 1,000 mcg by mouth daily.   diphenhydrAMINE  (BENADRYL ) 25 MG tablet Take 25 mg by mouth daily as needed for allergies.   donepezil  (ARICEPT ) 5 MG tablet Take 5 mg by mouth at bedtime.   doxycycline (VIBRA-TABS) 100 MG tablet Take 1 tablet (100 mg total) by mouth 2 (two) times daily.   fluticasone  (FLONASE ) 50 MCG/ACT nasal spray Place 2 sprays daily as needed into both nostrils for allergies or rhinitis.   gabapentin  (NEURONTIN ) 600 MG tablet Take 600 mg by mouth 3 (three) times daily.   JARDIANCE  10 MG TABS tablet TAKE 1 TABLET BY MOUTH EVERY DAY   metoprolol  tartrate (LOPRESSOR ) 25 MG tablet Take 25 mg by mouth 2 (two) times daily.   pantoprazole  (PROTONIX ) 40 MG tablet Take 1 tablet (40 mg total) by mouth daily.   XARELTO  20 MG TABS tablet Take 20 mg by mouth daily.   [DISCONTINUED] simvastatin  (ZOCOR ) 40 MG  tablet Takes 1/2 tablet daily bedtime   No facility-administered encounter medications on file as of 12/25/2023.   ALLERGIES: Allergies  Allergen Reactions   Ace Inhibitors Swelling    Angio edema (swelling of lips) Swelling around face Swelling in mouth   Allopurinol  Itching    Makes feet fill numb and tingle Pt stated he is not allergic to this 12-02-2023   Metformin  And Related Itching    Itching in higher doses   VACCINATION STATUS: Immunization History  Administered Date(s) Administered   INFLUENZA, HIGH DOSE SEASONAL PF 12/21/2015, 01/04/2018   Influenza Split 11/27/2011, 12/30/2012, 01/01/2014, 03/13/2017   Influenza Whole 01/27/2009, 11/30/2009, 11/17/2010   Pneumococcal Conjugate-13 02/04/2018   Pneumococcal Polysaccharide-23 12/02/2008    Diabetes He presents for his follow-up diabetic  visit. He has type 2 diabetes mellitus. Onset time: Diagnosed approximately  at age 68. His disease course has been improving. Pertinent negatives for hypoglycemia include no confusion, headaches, pallor or seizures. Pertinent negatives for diabetes include no chest pain, no fatigue, no polydipsia, no polyphagia, no polyuria and no weakness. Symptoms are improving. Diabetic complications include nephropathy. Risk factors for coronary artery disease include diabetes mellitus, dyslipidemia, male sex, hypertension and sedentary lifestyle. Current diabetic treatment includes insulin  injections. His weight is decreasing steadily. He is following a generally unhealthy diet. Meal planning includes ADA exchanges. He participates in exercise intermittently. His home blood glucose trend is decreasing steadily. His breakfast blood glucose range is generally 140-180 mg/dl. His lunch blood glucose range is generally 140-180 mg/dl. His dinner blood glucose range is generally 140-180 mg/dl. His bedtime blood glucose range is generally 140-180 mg/dl. His overall blood glucose range is 140-180 mg/dl. (He presents  with significantly improved glycemic profile.  His CGM AGP report shows 66% time in range, 30% Libre 1 hyperglycemia, 4% level 2 hyperglycemia.  His average blood glucose is 161 mg per DL for the most recent 14 days with glucose variability of 28.7%.  His previsit labs show A1c of 7.2% progressively improving from 8.1%.  He did not document any significant hypoglycemia.   ) An ACE inhibitor/angiotensin II receptor blocker is contraindicated.  Hypertension This is a chronic problem. The current episode started more than 1 year ago. The problem is controlled. Pertinent negatives include no chest pain, headaches, neck pain, palpitations or shortness of breath. Risk factors for coronary artery disease include dyslipidemia, diabetes mellitus, male gender and sedentary lifestyle. Past treatments include calcium  channel blockers.  Hyperlipidemia This is a chronic problem. The current episode started more than 1 year ago. The problem is uncontrolled. Exacerbating diseases include diabetes. Pertinent negatives include no chest pain, myalgias or shortness of breath. Current antihyperlipidemic treatment includes bile acid squestrants. Risk factors for coronary artery disease include dyslipidemia and diabetes mellitus.    Review of systems Limited as above.   Objective:    BP (!) 122/58   Pulse 72   Ht 6' (1.829 m)   Wt 185 lb 3.2 oz (84 kg)   BMI 25.12 kg/m   Wt Readings from Last 3 Encounters:  12/25/23 179 lb (81.2 kg)  12/25/23 185 lb 3.2 oz (84 kg)  12/24/23 183 lb (83 kg)      Recent Results (from the past 2160 hours)  CBC with Differential     Status: None   Collection Time: 12/01/23 10:25 PM  Result Value Ref Range   WBC 6.8 4.0 - 10.5 K/uL   RBC 4.46 4.22 - 5.81 MIL/uL   Hemoglobin 13.9 13.0 - 17.0 g/dL   HCT 58.8 60.9 - 47.9 %   MCV 92.2 80.0 - 100.0 fL   MCH 31.2 26.0 - 34.0 pg   MCHC 33.8 30.0 - 36.0 g/dL   RDW 87.5 88.4 - 84.4 %   Platelets 198 150 - 400 K/uL   nRBC 0.0 0.0 -  0.2 %   Neutrophils Relative % 71 %   Neutro Abs 4.7 1.7 - 7.7 K/uL   Lymphocytes Relative 17 %   Lymphs Abs 1.2 0.7 - 4.0 K/uL   Monocytes Relative 9 %   Monocytes Absolute 0.6 0.1 - 1.0 K/uL   Eosinophils Relative 3 %   Eosinophils Absolute 0.2 0.0 - 0.5 K/uL   Basophils Relative 0 %   Basophils Absolute 0.0 0.0 - 0.1  K/uL   Immature Granulocytes 0 %   Abs Immature Granulocytes 0.03 0.00 - 0.07 K/uL    Comment: Performed at [facility], [address], [place], [place] [address]  Basic metabolic panel     Status: Abnormal   Collection Time: 12/01/23 10:25 PM  Result Value Ref Range   Sodium 141 135 - 145 mmol/L   Potassium 4.0 3.5 - 5.1 mmol/L   Chloride 104 98 - 111 mmol/L   CO2 27 22 - 32 mmol/L   Glucose, Bld 232 (H) 70 - 99 mg/dL    Comment: Glucose reference range applies only to samples taken after fasting for at least 8 hours.   BUN 22 8 - 23 mg/dL   Creatinine, Ser 7.79 (H) 0.61 - 1.24 mg/dL   Calcium  8.8 (L) 8.9 - 10.3 mg/dL   GFR, Estimated 29 (L) >60 mL/min    Comment: (NOTE) Calculated using the CKD-EPI Creatinine Equation (2021)    Anion gap 10 5 - 15    Comment: Performed at [facility], [address] [place], [place] [address]  Sedimentation rate     Status: Abnormal   Collection Time: 12/01/23 10:25 PM  Result Value Ref Range   Sed Rate 67 (H) 0 - 20 mm/hr    Comment: Performed at [facility], [address], [place], [place] [address]  C-reactive protein     Status: Abnormal   Collection Time: 12/01/23 10:25 PM  Result Value Ref Range   CRP 2.0 (H) <1.0 mg/dL    Comment: Performed at [facility], [address] [place], [place] [address]  Hemoglobin A1c     Status: Abnormal   Collection Time: 12/01/23 10:25 PM  Result Value Ref Range   Hgb A1c MFr Bld 7.2 (H) 4.8 - 5.6 %    Comment: (NOTE) Diagnosis of Diabetes The following HbA1c ranges recommended by the American Diabetes Association (ADA) may be used as an aid in the diagnosis of  diabetes mellitus.  Hemoglobin             Suggested A1C NGSP%              Diagnosis  <5.7                   Non Diabetic  5.7-6.4                Pre-Diabetic  >6.4                   Diabetic  <7.0                   Glycemic control for                       adults with diabetes.     Mean Plasma Glucose 159.94 mg/dL    Comment: Performed at [facility], [address] [place], [place] [address]  Glucose, capillary     Status: Abnormal   Collection Time: 12/02/23  3:47 AM  Result Value Ref Range   Glucose-Capillary 144 (H) 70 - 99 mg/dL    Comment: Glucose reference range applies only to samples taken after fasting for at least 8 hours.  Comprehensive metabolic panel     Status: Abnormal   Collection Time: 12/02/23  5:29 AM  Result Value Ref Range   Sodium 141 135 - 145 mmol/L   Potassium 4.0 3.5 - 5.1 mmol/L   Chloride  105 98 - 111 mmol/L   CO2 28 22 - 32 mmol/L   Glucose, Bld 238 (H) 70 - 99 mg/dL    Comment: Glucose reference range applies only to samples taken after fasting for at least 8 hours.   BUN 21 8 - 23 mg/dL   Creatinine, Ser 7.92 (H) 0.61 - 1.24 mg/dL   Calcium  8.5 (L) 8.9 - 10.3 mg/dL   Total Protein 6.5 6.5 - 8.1 g/dL   Albumin 3.1 (L) 3.5 - 5.0 g/dL   AST 12 (L) 15 - 41 U/L   ALT 11 0 - 44 U/L   Alkaline Phosphatase 61 38 - 126 U/L   Total Bilirubin 0.6 0.0 - 1.2 mg/dL   GFR, Estimated 31 (L) >60 mL/min    Comment: (NOTE) Calculated using the CKD-EPI Creatinine Equation (2021)    Anion gap 8 5 - 15    Comment: Performed at [facility], [address], [place], [place] [address]  CBC     Status: Abnormal   Collection Time: 12/02/23  5:29 AM  Result Value Ref Range   WBC 7.0 4.0 - 10.5 K/uL   RBC 4.21 (L) 4.22 - 5.81 MIL/uL   Hemoglobin 13.0 13.0 - 17.0 g/dL   HCT 61.2 (L) 60.9 - 47.9 %   MCV 91.9 80.0 - 100.0 fL   MCH 30.9 26.0 - 34.0 pg   MCHC 33.6 30.0 - 36.0 g/dL   RDW 87.5 88.4 - 84.4 %   Platelets 181 150 - 400 K/uL   nRBC 0.0 0.0  - 0.2 %    Comment: Performed at [facility], [address] [place], [place] [address]  Magnesium      Status: None   Collection Time: 12/02/23  5:29 AM  Result Value Ref Range   Magnesium  2.3 1.7 - 2.4 mg/dL    Comment: Performed at [facility], [address], [place], [place] [address]  Phosphorus     Status: None   Collection Time: 12/02/23  5:29 AM  Result Value Ref Range   Phosphorus 2.5 2.5 - 4.6 mg/dL    Comment: Performed at [facility], [address], [place], [place] [address]  Glucose, capillary     Status: Abnormal   Collection Time: 12/02/23  8:01 AM  Result Value Ref Range   Glucose-Capillary 149 (H) 70 - 99 mg/dL    Comment: Glucose reference range applies only to samples taken after fasting for at least 8 hours.   Comment 1 Document in Chart   Glucose, capillary     Status: Abnormal   Collection Time: 12/02/23 11:36 AM  Result Value Ref Range   Glucose-Capillary 118 (H) 70 - 99 mg/dL    Comment: Glucose reference range applies only to samples taken after fasting for at least 8 hours.   Comment 1 Document in Chart   Glucose, capillary     Status: Abnormal   Collection Time: 12/02/23  4:59 PM  Result Value Ref Range   Glucose-Capillary 139 (H) 70 - 99 mg/dL    Comment: Glucose reference range applies only to samples taken after fasting for at least 8 hours.  Glucose, capillary     Status: Abnormal   Collection Time: 12/02/23  7:42 PM  Result Value Ref Range   Glucose-Capillary 194 (H) 70 - 99 mg/dL    Comment: Glucose reference range applies only to samples taken after fasting for at least 8 hours.  CBC     Status: Abnormal   Collection Time:  12/03/23  5:17 AM  Result Value Ref Range   WBC 6.5 4.0 - 10.5 K/uL   RBC 4.22 4.22 - 5.81 MIL/uL   Hemoglobin 13.0 13.0 - 17.0 g/dL   HCT 61.5 (L) 60.9 - 47.9 %   MCV 91.0 80.0 - 100.0 fL   MCH 30.8 26.0 - 34.0 pg   MCHC 33.9 30.0 - 36.0 g/dL   RDW 87.7 88.4 - 84.4 %   Platelets 178 150 - 400 K/uL   nRBC 0.0 0.0 -  0.2 %    Comment: Performed at [facility], [address] [place], [place] [address]  Basic metabolic panel with GFR     Status: Abnormal   Collection Time: 12/03/23  5:17 AM  Result Value Ref Range   Sodium 141 135 - 145 mmol/L   Potassium 4.0 3.5 - 5.1 mmol/L   Chloride 106 98 - 111 mmol/L   CO2 27 22 - 32 mmol/L   Glucose, Bld 110 (H) 70 - 99 mg/dL    Comment: Glucose reference range applies only to samples taken after fasting for at least 8 hours.   BUN 21 8 - 23 mg/dL   Creatinine, Ser 7.73 (H) 0.61 - 1.24 mg/dL   Calcium  8.6 (L) 8.9 - 10.3 mg/dL   GFR, Estimated 28 (L) >60 mL/min    Comment: (NOTE) Calculated using the CKD-EPI Creatinine Equation (2021)    Anion gap 8 5 - 15    Comment: Performed at [facility], [address], [place], [place] [address]  Glucose, capillary     Status: Abnormal   Collection Time: 12/03/23  7:32 AM  Result Value Ref Range   Glucose-Capillary 101 (H) 70 - 99 mg/dL    Comment: Glucose reference range applies only to samples taken after fasting for at least 8 hours.  Aerobic Culture w Gram Stain (superficial specimen)     Status: None   Collection Time: 12/03/23 10:35 AM   Specimen: Wound  Result Value Ref Range   Specimen Description WOUND    Special Requests TOE    Gram Stain      NO WBC SEEN NO ORGANISMS SEEN Performed at [facility], [address], [place], [place] [address]    Culture FEW STAPHYLOCOCCUS EPIDERMIDIS    Report Status 12/07/2023 FINAL    Organism ID, Bacteria STAPHYLOCOCCUS EPIDERMIDIS       Susceptibility   Staphylococcus epidermidis - MIC*    CIPROFLOXACIN  <=0.5 SENSITIVE Sensitive     ERYTHROMYCIN >=8 RESISTANT Resistant     GENTAMICIN <=0.5 SENSITIVE Sensitive     OXACILLIN >=4 RESISTANT Resistant     TETRACYCLINE 2 SENSITIVE Sensitive     VANCOMYCIN  2 SENSITIVE Sensitive     TRIMETH/SULFA 160 RESISTANT Resistant     CLINDAMYCIN <=0.25 SENSITIVE Sensitive     RIFAMPIN <=0.5 SENSITIVE Sensitive      Inducible Clindamycin NEGATIVE Sensitive     * FEW STAPHYLOCOCCUS EPIDERMIDIS  Glucose, capillary     Status: Abnormal   Collection Time: 12/03/23 11:21 AM  Result Value Ref Range   Glucose-Capillary 120 (H) 70 - 99 mg/dL    Comment: Glucose reference range applies only to samples taken after fasting for at least 8 hours.  Glucose, capillary     Status: Abnormal   Collection Time: 12/03/23  4:37 PM  Result Value Ref Range   Glucose-Capillary 112 (H) 70 - 99 mg/dL    Comment: Glucose reference range applies only to samples taken after fasting  for at least 8 hours.  Glucose, capillary     Status: Abnormal   Collection Time: 12/03/23 10:47 PM  Result Value Ref Range   Glucose-Capillary 156 (H) 70 - 99 mg/dL    Comment: Glucose reference range applies only to samples taken after fasting for at least 8 hours.  Glucose, capillary     Status: Abnormal   Collection Time: 12/04/23  1:51 AM  Result Value Ref Range   Glucose-Capillary 124 (H) 70 - 99 mg/dL    Comment: Glucose reference range applies only to samples taken after fasting for at least 8 hours.  CBC     Status: Abnormal   Collection Time: 12/04/23  3:53 AM  Result Value Ref Range   WBC 6.9 4.0 - 10.5 K/uL   RBC 4.27 4.22 - 5.81 MIL/uL   Hemoglobin 13.3 13.0 - 17.0 g/dL   HCT 61.4 (L) 60.9 - 47.9 %   MCV 90.2 80.0 - 100.0 fL   MCH 31.1 26.0 - 34.0 pg   MCHC 34.5 30.0 - 36.0 g/dL   RDW 87.8 88.4 - 84.4 %   Platelets 183 150 - 400 K/uL   nRBC 0.0 0.0 - 0.2 %    Comment: Performed at [facility], [address], [place], [place] [address]  Basic metabolic panel with GFR     Status: Abnormal   Collection Time: 12/04/23  3:53 AM  Result Value Ref Range   Sodium 142 135 - 145 mmol/L   Potassium 4.1 3.5 - 5.1 mmol/L   Chloride 102 98 - 111 mmol/L   CO2 27 22 - 32 mmol/L   Glucose, Bld 105 (H) 70 - 99 mg/dL    Comment: Glucose reference range applies only to samples taken after fasting for at least 8 hours.   BUN 23 8 - 23 mg/dL    Creatinine, Ser 7.82 (H) 0.61 - 1.24 mg/dL   Calcium  8.7 (L) 8.9 - 10.3 mg/dL   GFR, Estimated 30 (L) >60 mL/min    Comment: (NOTE) Calculated using the CKD-EPI Creatinine Equation (2021)    Anion gap 13 5 - 15    Comment: Performed at [facility], [address], [place], [place] [address]  Glucose, capillary     Status: Abnormal   Collection Time: 12/04/23  8:08 AM  Result Value Ref Range   Glucose-Capillary 113 (H) 70 - 99 mg/dL    Comment: Glucose reference range applies only to samples taken after fasting for at least 8 hours.   Comment 1 Notify RN    Comment 2 Document in Chart   Glucose, capillary     Status: Abnormal   Collection Time: 12/04/23 11:01 AM  Result Value Ref Range   Glucose-Capillary 162 (H) 70 - 99 mg/dL    Comment: Glucose reference range applies only to samples taken after fasting for at least 8 hours.  Glucose, capillary     Status: None   Collection Time: 12/04/23  5:52 PM  Result Value Ref Range   Glucose-Capillary 93 70 - 99 mg/dL    Comment: Glucose reference range applies only to samples taken after fasting for at least 8 hours.  Glucose, capillary     Status: Abnormal   Collection Time: 12/04/23  9:29 PM  Result Value Ref Range   Glucose-Capillary 122 (H) 70 - 99 mg/dL    Comment: Glucose reference range applies only to samples taken after fasting for at least 8 hours.   Comment 1 Notify RN  Surgical PCR screen     Status: None   Collection Time: 12/05/23  4:32 AM   Specimen: Nasal Mucosa; Nasal Swab  Result Value Ref Range   MRSA, PCR NEGATIVE NEGATIVE   Staphylococcus aureus NEGATIVE NEGATIVE    Comment: (NOTE) The Xpert SA Assay (FDA approved for NASAL specimens in patients 84 years of age and older), is one component of a comprehensive surveillance program. It is not intended to diagnose infection nor to guide or monitor treatment. Performed at [facility], [address] [place], [place] [address]   Heparin  level  (unfractionated)     Status: Abnormal   Collection Time: 12/05/23  5:31 AM  Result Value Ref Range   Heparin  Unfractionated 0.79 (H) 0.30 - 0.70 IU/mL    Comment: (NOTE) The clinical reportable range upper limit is being lowered to >1.10 to align with the FDA approved guidance for the current laboratory assay.  If heparin  results are below expected values, and patient dosage has  been confirmed, suggest follow up testing of antithrombin III levels. Performed at [facility], [address], [place], [place] [address]   CBC     Status: None   Collection Time: 12/05/23  5:31 AM  Result Value Ref Range   WBC 7.5 4.0 - 10.5 K/uL   RBC 4.85 4.22 - 5.81 MIL/uL   Hemoglobin 14.7 13.0 - 17.0 g/dL   HCT 57.1 60.9 - 47.9 %   MCV 88.2 80.0 - 100.0 fL   MCH 30.3 26.0 - 34.0 pg   MCHC 34.3 30.0 - 36.0 g/dL   RDW 87.9 88.4 - 84.4 %   Platelets 206 150 - 400 K/uL   nRBC 0.0 0.0 - 0.2 %    Comment: Performed at [facility], [address], [place], [place] [address]  APTT     Status: Abnormal   Collection Time: 12/05/23  5:31 AM  Result Value Ref Range   aPTT 94 (H) 24 - 36 seconds    Comment:        IF BASELINE aPTT IS ELEVATED, SUGGEST PATIENT RISK ASSESSMENT BE USED TO DETERMINE APPROPRIATE ANTICOAGULANT THERAPY. Performed at [facility], [address] [place], [place] [address]   Basic metabolic panel     Status: Abnormal   Collection Time: 12/05/23  5:31 AM  Result Value Ref Range   Sodium 139 135 - 145 mmol/L    Comment: RESULT CALLED TO, READ BACK BY AND VERIFIED WITH: LAYMON EVELINE PEAK AT [id number] 12/05/2023 BY ZBEECH.    Potassium 4.2 3.5 - 5.1 mmol/L   Chloride 106 98 - 111 mmol/L   CO2 21 (L) 22 - 32 mmol/L   Glucose, Bld 93 70 - 99 mg/dL    Comment: Glucose reference range applies only to samples taken after fasting for at least 8 hours.   BUN 20 8 - 23 mg/dL   Creatinine, Ser 7.98 (H) 0.61 - 1.24 mg/dL   Calcium  9.1 8.9 - 10.3 mg/dL   GFR,  Estimated 33 (L) >60 mL/min    Comment: (NOTE) Calculated using the CKD-EPI Creatinine Equation (2021)    Anion gap 12 5 - 15    Comment: Performed at [facility], [address] [place], [place] [address]  Glucose, capillary     Status: Abnormal   Collection Time: 12/05/23 10:20 AM  Result Value Ref Range   Glucose-Capillary 69 (L) 70 - 99 mg/dL    Comment: Glucose reference  range applies only to samples taken after fasting for at least 8 hours.  Glucose, capillary     Status: None   Collection Time: 12/05/23 10:50 AM  Result Value Ref Range   Glucose-Capillary 91 70 - 99 mg/dL    Comment: Glucose reference range applies only to samples taken after fasting for at least 8 hours.  Glucose, capillary     Status: Abnormal   Collection Time: 12/05/23  4:59 PM  Result Value Ref Range   Glucose-Capillary 100 (H) 70 - 99 mg/dL    Comment: Glucose reference range applies only to samples taken after fasting for at least 8 hours.  Glucose, capillary     Status: Abnormal   Collection Time: 12/05/23  8:51 PM  Result Value Ref Range   Glucose-Capillary 113 (H) 70 - 99 mg/dL    Comment: Glucose reference range applies only to samples taken after fasting for at least 8 hours.  CBC     Status: Abnormal   Collection Time: 12/06/23  2:58 AM  Result Value Ref Range   WBC 9.2 4.0 - 10.5 K/uL   RBC 4.31 4.22 - 5.81 MIL/uL   Hemoglobin 13.1 13.0 - 17.0 g/dL   HCT 61.4 (L) 60.9 - 47.9 %   MCV 89.3 80.0 - 100.0 fL   MCH 30.4 26.0 - 34.0 pg   MCHC 34.0 30.0 - 36.0 g/dL   RDW 87.9 88.4 - 84.4 %   Platelets 176 150 - 400 K/uL   nRBC 0.0 0.0 - 0.2 %    Comment: Performed at [facility], [address] [place], [place] [address]  Basic metabolic panel with GFR     Status: Abnormal   Collection Time: 12/06/23  2:58 AM  Result Value Ref Range   Sodium 139 135 - 145 mmol/L   Potassium 4.2 3.5 - 5.1 mmol/L   Chloride 103 98 - 111 mmol/L   CO2 24 22 - 32 mmol/L   Glucose, Bld 90 70 - 99  mg/dL    Comment: Glucose reference range applies only to samples taken after fasting for at least 8 hours.   BUN 20 8 - 23 mg/dL   Creatinine, Ser 7.93 (H) 0.61 - 1.24 mg/dL   Calcium  8.9 8.9 - 10.3 mg/dL   GFR, Estimated 32 (L) >60 mL/min    Comment: (NOTE) Calculated using the CKD-EPI Creatinine Equation (2021)    Anion gap 12 5 - 15    Comment: Performed at [facility], [address], [place], [place] [address]  Heparin  level (unfractionated)     Status: None   Collection Time: 12/06/23  2:58 AM  Result Value Ref Range   Heparin  Unfractionated 0.60 0.30 - 0.70 IU/mL    Comment: (NOTE) The clinical reportable range upper limit is being lowered to >1.10 to align with the FDA approved guidance for the current laboratory assay.  If heparin  results are below expected values, and patient dosage has  been confirmed, suggest follow up testing of antithrombin III levels. Performed at [facility], [address], [place], [place] [address]   APTT     Status: Abnormal   Collection Time: 12/06/23  2:58 AM  Result Value Ref Range   aPTT 86 (H) 24 - 36 seconds    Comment:        IF BASELINE aPTT IS ELEVATED, SUGGEST PATIENT RISK ASSESSMENT BE USED TO DETERMINE APPROPRIATE ANTICOAGULANT THERAPY. Performed at [facility], [address] [place], [place] [address]   Lipid panel     Status: Abnormal   Collection Time: 12/06/23  2:58 AM  Result Value Ref Range   Cholesterol 168 0 - 200 mg/dL   Triglycerides 63 <849 mg/dL   HDL 37 (L) >59 mg/dL   Total CHOL/HDL Ratio 4.5 RATIO   VLDL 13 0 - 40 mg/dL   LDL Cholesterol 881 (H) 0 - 99 mg/dL    Comment:        Total Cholesterol/HDL:CHD Risk Coronary Heart Disease Risk Table                     Men   Women  1/2 Average Risk   3.4   3.3  Average Risk       5.0   4.4  2 X Average Risk   9.6   7.1  3 X Average Risk  23.4   11.0        Use the calculated Patient Ratio above and the CHD Risk Table to determine the  patient's CHD Risk.        ATP III CLASSIFICATION (LDL):  <100     mg/dL   Optimal  899-870  mg/dL   Near or Above                    Optimal  130-159  mg/dL   Borderline  839-810  mg/dL   High  >809     mg/dL   Very High Performed at [facility], [address], [place], [place] [address]   Glucose, capillary     Status: None   Collection Time: 12/06/23  5:50 AM  Result Value Ref Range   Glucose-Capillary 91 70 - 99 mg/dL    Comment: Glucose reference range applies only to samples taken after fasting for at least 8 hours.  Glucose, capillary     Status: None   Collection Time: 12/06/23  8:26 AM  Result Value Ref Range   Glucose-Capillary 86 70 - 99 mg/dL    Comment: Glucose reference range applies only to samples taken after fasting for at least 8 hours.  Heparin  level (unfractionated)     Status: Abnormal   Collection Time: 12/06/23  9:13 AM  Result Value Ref Range   Heparin  Unfractionated >1.10 (H) 0.30 - 0.70 IU/mL    Comment: (NOTE) The clinical reportable range upper limit is being lowered to >1.10 to align with the FDA approved guidance for the current laboratory assay.  If heparin  results are below expected values, and patient dosage has  been confirmed, suggest follow up testing of antithrombin III levels. Performed at [facility], [address] [place], [place] [address]   Glucose, capillary     Status: Abnormal   Collection Time: 12/06/23 11:33 AM  Result Value Ref Range   Glucose-Capillary 131 (H) 70 - 99 mg/dL    Comment: Glucose reference range applies only to samples taken after fasting for at least 8 hours.   Comment 1 Notify RN    Comment 2 Document in Chart   Comprehensive metabolic panel with GFR     Status: Abnormal   Collection Time: 12/18/23  9:04 AM  Result Value Ref Range   Glucose 115 (H) 70 - 99 mg/dL   BUN 30 (H) 8 - 27 mg/dL   Creatinine, Ser 7.93 (H) 0.76 - 1.27 mg/dL   eGFR 32 (L) >40 fO/fpw/8.26   BUN/Creatinine Ratio 15  10 - 24   Sodium 140 134 - 144 mmol/L   Potassium 3.5 3.5 - 5.2 mmol/L   Chloride 101 96 - 106 mmol/L   CO2 23 20 - 29 mmol/L   Calcium  9.6 8.6 - 10.2 mg/dL   Total Protein 7.2 6.0 - 8.5 g/dL   Albumin 4.4 3.7 - 4.7 g/dL   Globulin, Total 2.8 1.5 - 4.5 g/dL   Bilirubin Total 0.7 0.0 - 1.2 mg/dL   Alkaline Phosphatase 95 48 - 129 IU/L   AST 16 0 - 40 IU/L   ALT 14 0 - 44 IU/L  Lipid panel     Status: None   Collection Time: 12/18/23  9:04 AM  Result Value Ref Range   Cholesterol, Total 149 100 - 199 mg/dL   Triglycerides 885 0 - 149 mg/dL   HDL 42 >60 mg/dL   VLDL Cholesterol Cal 21 5 - 40 mg/dL   LDL Chol Calc (NIH) 86 0 - 99 mg/dL   Chol/HDL Ratio 3.5 0.0 - 5.0 ratio    Comment:                                   T. Chol/HDL Ratio                                             Men  Women                               1/2 Avg.Risk  3.4    3.3                                   Avg.Risk  5.0    4.4                                2X Avg.Risk  9.6    7.1                                3X Avg.Risk 23.4   11.0   TSH     Status: None   Collection Time: 12/18/23  9:04 AM  Result Value Ref Range   TSH 0.822 0.450 - 4.500 uIU/mL  T4, free     Status: None   Collection Time: 12/18/23  9:04 AM  Result Value Ref Range   Free T4 1.31 0.82 - 1.77 ng/dL  C-reactive protein     Status: Abnormal   Collection Time: 12/24/23  9:17 AM  Result Value Ref Range   CRP 10.3 (H) <8.0 mg/L  Sedimentation rate     Status: Abnormal   Collection Time: 12/24/23  9:17 AM  Result Value Ref Range   Sed Rate 29 (H) 0 - 20 mm/h  Basic Metabolic Panel Without GFR     Status: Abnormal   Collection Time: 12/24/23  9:17 AM  Result Value Ref Range   Glucose, Bld 198 (H) 65 - 99 mg/dL    Comment: .            Fasting reference interval . For someone  without known diabetes, a glucose value >125 mg/dL indicates that they may have diabetes and this should be confirmed with a follow-up test. .    BUN 26 (H) 7  - 25 mg/dL   Creat 7.84 (H) 9.29 - 1.22 mg/dL   BUN/Creatinine Ratio 12 6 - 22 (calc)   Sodium 141 135 - 146 mmol/L   Potassium 3.6 3.5 - 5.3 mmol/L   Chloride 104 98 - 110 mmol/L   CO2 29 20 - 32 mmol/L   Calcium  9.7 8.6 - 10.3 mg/dL  CBC with Differential/Platelet     Status: Abnormal   Collection Time: 12/24/23  9:17 AM  Result Value Ref Range   WBC 9.8 3.8 - 10.8 Thousand/uL   RBC 5.08 4.20 - 5.80 Million/uL   Hemoglobin 15.3 13.2 - 17.1 g/dL   HCT 53.4 61.4 - 49.9 %   MCV 91.5 80.0 - 100.0 fL   MCH 30.1 27.0 - 33.0 pg   MCHC 32.9 32.0 - 36.0 g/dL    Comment: For adults, a slight decrease in the calculated MCHC value (in the range of 30 to 32 g/dL) is most likely not clinically significant; however, it should be interpreted with caution in correlation with other red cell parameters and the patient's clinical condition.    RDW 12.9 11.0 - 15.0 %   Platelets 184 140 - 400 Thousand/uL   MPV 11.0 7.5 - 12.5 fL   Neutro Abs 8,212 (H) 1,500 - 7,800 cells/uL   Absolute Lymphocytes 872 850 - 3,900 cells/uL   Absolute Monocytes 500 200 - 950 cells/uL   Eosinophils Absolute 176 15 - 500 cells/uL   Basophils Absolute 39 0 - 200 cells/uL   Neutrophils Relative % 83.8 %   Total Lymphocyte 8.9 %   Monocytes Relative 5.1 %   Eosinophils Relative 1.8 %   Basophils Relative 0.4 %   Lipid Panel     Component Value Date/Time   CHOL 149 12/18/2023 0904   TRIG 114 12/18/2023 0904   HDL 42 12/18/2023 0904   CHOLHDL 3.5 12/18/2023 0904   CHOLHDL 4.5 12/06/2023 0258   VLDL 13 12/06/2023 0258   LDLCALC 86 12/18/2023 0904   LDLCALC 152 (H) 11/20/2017 0745   LDLDIRECT 147.5 06/10/2012 0930      Latest Ref Rng & Units 12/24/2023    9:17 AM 12/18/2023    9:04 AM 12/06/2023    2:58 AM  CMP  Glucose 65 - 99 mg/dL 801  884  90   BUN 7 - 25 mg/dL 26  30  20    Creatinine 0.70 - 1.22 mg/dL 7.84  7.93  7.93   Sodium 135 - 146 mmol/L 141  140  139   Potassium 3.5 - 5.3 mmol/L 3.6  3.5  4.2    Chloride 98 - 110 mmol/L 104  101  103   CO2 20 - 32 mmol/L 29  23  24    Calcium  8.6 - 10.3 mg/dL 9.7  9.6  8.9   Total Protein 6.0 - 8.5 g/dL  7.2    Total Bilirubin 0.0 - 1.2 mg/dL  0.7    Alkaline Phos 48 - 129 IU/L  95    AST 0 - 40 IU/L  16    ALT 0 - 44 IU/L  14        Assessment & Plan:   1. Type 2 diabetes mellitus with stage 2 chronic kidney disease  He presents with worsening glycemic profile due to the fact that  he missed at least a third of his insulin  injection opportunities.  He presents with significantly improved glycemic profile.  His CGM AGP report shows 66% time in range, 30% Libre 1 hyperglycemia, 4% level 2 hyperglycemia.  His average blood glucose is 161 mg per DL for the most recent 14 days with glucose variability of 28.7%.  His previsit labs show A1c of 7.2% progressively improving from 8.1%.  He did not document any significant hypoglycemia.     Patient is advised to stick to the recommended insulin  dose and a routine mealtimes to eat 3 meals  a day and avoid unnecessary snacks ( to snack only to correct hypoglycemia).  - he acknowledges that there is a room for improvement in his food and drink choices. - Suggestion is made for him to avoid simple carbohydrates  from his diet including Cakes, Sweet Desserts, Ice Cream, Soda (diet and regular), Sweet Tea, Candies, Chips, Cookies, Store Bought Juices, Alcohol in Excess of  1-2 drinks a day, Artificial Sweeteners,  Coffee Creamer, and Sugar-free Products, Lemonade. This will help patient to have more stable blood glucose profile and potentially avoid unintended weight gain.  -He maintains near target glycemic profile utilizing his current regimen, advised to continue Novolin  70/30 at 5 units with breakfast and 5 units with supper  only when his Premeal blood glucose readings are above 90 mg/day.   -He is benefiting from Jardiance .  I have advised him to continue Jardiance  10 mg p.o. daily at breakfast.  Side  effects and precautions discussed with him. - Patient is instructed to call back with extremes of blood glucose readings  less than 70 or greater than 200 mg/dl. He is benefiting, and advised to continue using his CGM device. -He is not a candidate for Metformin  nor incretin therapy.    2. Mixed hyperlipidemia - His repeat lipid panel showed significant worsening in his LDL at 120 from 86.  He is advised to continue Crestor  20 mg p.o. nightly.   Side effects and precautions discussed with him.  He will be considered for fasting lipid panel before his next visit.   - He is advised to continue with omega-3 fatty acids ,avoid butter and fried food and exercise regularly.  3. Essential hypertension  - His blood pressure is controlled to target.   He is currently on amlodipine  10 mg p.o. daily, metoprolol  25 mg p.o. twice daily.     4) vitamin D  deficiency -He is on ongoing supplement with vitamin D3  5000 units daily for the next 90 days.   5) multinodular goiter: -She was recently seen for work-up of multinodular goiter.  It was observed that he has had stable euthyroid multinodular goiter documented for the last 7 years.  He will not need biopsy or surgical intervention for this at this time.  7 years of stability makes it unnecessary to do another thyroid  imaging.  His recent thyroid  function test and within normal state, he will need yearly thyroid  function test.  His recent screening ABI was negative for PAD on June 03, 2020.  His neck study will be repeated in March 2027, or sooner if needed.  Diabetes foot exam recently was significant for diminished monofilament sensation as well as diminished sensation on dorsalis pedis and posterior tibial arteries.  He is scheduled to have right second toe amputation next week due to osteomyelitis.  He is advised to continue close follow-up with his PCP Dr. Norleen General.   I spent  26  minutes in the care of the patient today including review  of labs from CMP, Lipids, Thyroid  Function, Hematology (current and previous including abstractions from other facilities); face-to-face time discussing  his blood glucose readings/logs, discussing hypoglycemia and hyperglycemia episodes and symptoms, medications doses, his options of short and long term treatment based on the latest standards of care / guidelines;  discussion about incorporating lifestyle medicine;  and documenting the encounter. Risk reduction counseling performed per USPSTF guidelines to reduce cardiovascular risk factors.     Please refer to Patient Instructions for Blood Glucose Monitoring and Insulin /Medications Dosing Guide  in media tab for additional information. Please  also refer to  Patient Self Inventory in the Media  tab for reviewed elements of pertinent patient history.  Hector Rose participated in the discussions, expressed understanding, and voiced agreement with the above plans.  All questions were answered to his satisfaction. he is encouraged to contact clinic should he have any questions or concerns prior to his return visit.    Follow up plan: Return in about 4 months (around 04/26/2024) for Bring Meter/CGM Device/Logs- A1c in Office.  Ranny Earl, MD Phone: [phone]  Fax: [phone]  -  This note was partially dictated with voice recognition software. Similar sounding words can be transcribed inadequately or may not  be corrected upon review.  12/25/2023, 2:30 PM

## 2023-12-25 NOTE — Progress Notes (Signed)
 Spoke with the pt re the time change. He will arrive tom at 0945. NPO post mn, and to stop clear liquids at 0915.

## 2023-12-25 NOTE — Anesthesia Preprocedure Evaluation (Signed)
 Anesthesia Evaluation  Patient identified by MRN, date of birth, ID band Patient awake    Reviewed: Allergy & Precautions, NPO status , Patient's Chart, lab work & pertinent test results, reviewed documented beta blocker date and time   History of Anesthesia Complications (+) DIFFICULT AIRWAY and history of anesthetic complications  Airway Mallampati: II  TM Distance: >3 FB Neck ROM: Full    Dental  (+) Dental Advisory Given, Caps, Missing   Pulmonary asthma    Pulmonary exam normal breath sounds clear to auscultation       Cardiovascular hypertension, Pt. on medications and Pt. on home beta blockers Normal cardiovascular exam Rhythm:Regular Rate:Normal     Neuro/Psych negative neurological ROS     GI/Hepatic Neg liver ROS,GERD  Medicated,,  Endo/Other  diabetes, Type 2, Insulin  Dependent    Renal/GU Renal InsufficiencyRenal diseaseright renal cell carcinoma (s/p laparoscopic radical nephrectomy 05/21/2017)     Musculoskeletal  (+) Arthritis ,  Osteomyelitis Right 2nd Toe   Abdominal   Peds  Hematology  (+) Blood dyscrasia (Plavix , Xarelto )   Anesthesia Other Findings Day of surgery medications reviewed with the patient.  Airway Difficulty Due To: Difficulty was anticipated, Difficult Airway- due to large tongue, Difficult Airway- due to reduced neck mobility, Difficult Airway- due to anterior larynx, Difficult Airway- due to limited oral opening and Difficult Airway- due to dentition  Reproductive/Obstetrics                              Anesthesia Physical Anesthesia Plan  ASA: 3  Anesthesia Plan: General   Post-op Pain Management: Tylenol  PO (pre-op)*   Induction: Intravenous  PONV Risk Score and Plan: 2 and Dexamethasone  and Ondansetron   Airway Management Planned: LMA  Additional Equipment:   Intra-op Plan:   Post-operative Plan: Extubation in OR  Informed Consent: I  have reviewed the patients History and Physical, chart, labs and discussed the procedure including the risks, benefits and alternatives for the proposed anesthesia with the patient or authorized representative who has indicated his/her understanding and acceptance.     Dental advisory given  Plan Discussed with: CRNA  Anesthesia Plan Comments: (PAT note written 12/25/2023 by Mouna Yager, PA-C.  )         Anesthesia Quick Evaluation

## 2023-12-25 NOTE — Patient Instructions (Signed)

## 2023-12-25 NOTE — Progress Notes (Signed)
 SDW call  Patient was given pre-op instructions over the phone. Patient verbalized understanding of instructions provided.     PCP - Dr. Norleen General Endocrinologist: Dr. Ethelle Earl, [facility] Cardiologist -  Pulmonary:    PPM/ICD - denies Device Orders - na Rep Notified - na   Chest x-ray - na EKG -  DOS, 12/26/2023 Stress Test -03/02/2020 ECHO -  Cardiac Cath -  PFT: 05/13/2013  Sleep Study/sleep apnea/CPAP: denies  Type II diabetic. A1C 7.2 on 12/01/2023.  CGM Freestyle libre to right arm Fasting Blood sugar range: 70-100 How often check sugars: continuous Jardiance , hold 72 hrs, last dose this morning   Blood Thinner Instructions: Xarelto  and Plavix , continue per Dr. Duda Aspirin  Instructions:denies   ERAS Protcol - Clears until 1040   Anesthesia review: Yes. Admit 9/20-9/25, DVT, HTN, DM, asthma   Patient denies shortness of breath, fever, cough and chest pain over the phone call  Your procedure is scheduled on Wednesday December 26, 2023  Report to [facility] Main Entrance A at  1110   A.M., then check in with the Admitting office.  Call this number if you have problems the morning of surgery:  [phone]   If you have any questions prior to your surgery date call [phone]: Open Monday-Friday 8am-4pm If you experience any cold or flu symptoms such as cough, fever, chills, shortness of breath, etc. between now and your scheduled surgery, please notify us  at the above number     Remember:  Do not eat after midnight the night before your surgery  You may drink clear liquids until  1040   the morning of your surgery.   Clear liquids allowed are: Water , Non-Citrus Juices (without pulp), Carbonated Beverages, Clear Tea, Black Coffee ONLY (NO MILK, CREAM OR POWDERED CREAMER of any kind), and Gatorade   Take these medicines the morning of surgery with A SIP OF WATER :  Amlodipine , atorvastatin , doxycycline, neurontin , metoprolol , protonix , xarelto ,  plavix   As needed: flonase   As of today, STOP taking any Aspirin  (unless otherwise instructed by your surgeon) Aleve, Naproxen, Ibuprofen, Motrin, Advil, Goody's, BC's, all herbal medications, fish oil, and all vitamins.

## 2023-12-25 NOTE — Progress Notes (Addendum)
 Anesthesia Chart Review: SAME DAY WORK-UP  Case: [id number] Date/Time: 12/26/23 1157   Procedure: AMPUTATION, TOE (Right: Toe) - RIGHT 2ND TOE AMPUTATION   Anesthesia type: Choice   Diagnosis: Osteomyelitis of right foot, unspecified type (HCC) [M86.9]   Pre-op diagnosis: Osteomyelitis Right 2nd Toe   [facility]: [facility] ROOM 11 / [facility]   Surgeons: Harden Jerona GAILS, MD       DISCUSSION: Patient is an 83 year old male scheduled for the above procedure.  History includes never smoker, DIFFICULT INTUBATION, HTN, HLD, DM2, DVT (LLE 2/26/007; acute on chronic LLE 03/02/2007; RLE 12/22/2013; chronic LLE 10/122/2019; mostly chronic RLE 12/02/2023), hypothyroidism, tremor, prostatectomy (2002 for BPH/LUTS), right renal cell carcinoma (s/p laparoscopic radical nephrectomy 05/21/2017), CKD (stage IV), GERD, PVD (s/p angioplasty of right peroneal and posterior tibial arteries 12/05/2023), osteomyelitis (right foot). Chest CT 04/16/2013 showed calcified and noncalcified pleural plaques within the right hemi thorax felt compatible with prior asbestos exposure.   Difficult intubation history was prior to 2019. For 05/21/2017 intubation, MAC and Glidescope used, IV induction, mask ventilation without difficulty.  ETT placed with 1 attempt. Difficulty Due To: Difficulty was anticipated, Difficult Airway- due to large tongue, Difficult Airway- due to reduced neck mobility, Difficult Airway- due to anterior larynx, Difficult Airway- due to limited oral opening and Difficult Airway- due to dentition Comments: Caps on right front upper teeth, intact after intubation.Elective glidescope, with issues as above. Small oral opening. Good visualization with glidescope, Unable to find previous record with difficult airway.  Per [facility] note from 08/20/2020, Upon retroflexion, in the  Cardia, at the location of the esophageal rings, bleeding ensued from a Mallory-Weiss tear. The patient had some laryngospasm, therefore  removed the scope and the patient was intubated.  After intubation a therapeutic upper scope was used to localize the site of bleeding at the GE jxn and 1 clip was placed with excellent hemostasis.  Recent admission for right second toe osteomyelitis 12/01/2023 - 12/06/2023. He had RLE peroneal and PT angioplasties. ID prescribed cefadroxil  . He had ID follow-up with Dr. Jomarie [place] on 12/24/2023. He wrote, Osteomyelitis and chronic wound of right second toe in diabetes mellitus Chronic osteomyelitis. Surgical intervention needed to prevent complications. - Urgent referral to Dr. Jerona Harden for surgical evaluation and amputation of toe --if cultures are obtained we can tailor mop-up antibiotics - Order baseline blood work. - give him a month of doxycycline to bridge him to surgery --followup with Dr. Overton in November to close the loop.  A1C 7.2 on 12/01/2023. CGM Freestyle Libre to right arm.  Last Jardiance  12/25/2023.  He can continue Xarelto  and Plavix  per Dr. Harden.   He is a same-day workup.  Anesthesia team to evaluate on the day of surgery.  Updated labs and EKG on arrival as indicated.   VS: Ht 6' (1.829 m)   Wt 81.2 kg   BMI 24.28 kg/m   BP Readings from Last 3 Encounters:  12/25/23 (!) 122/58  12/24/23 (!) 142/77  12/06/23 131/67   Pulse Readings from Last 3 Encounters:  12/25/23 72  12/24/23 80  12/06/23 68    PROVIDERS: Marvine Rush, MD is PCP Ethelle Earl, MD is endocrinologist Leigh Standing, MD is GI Tat, [place], DO is neurologist   LABS: Most recent lab results in [facility] include: Lab Results  Component Value Date   WBC 9.8 12/24/2023   HGB 15.3 12/24/2023   HCT 46.5 12/24/2023   PLT 184 12/24/2023   GLUCOSE 198 (H)  12/24/2023   CHOL 149 12/18/2023   TRIG 114 12/18/2023   HDL 42 12/18/2023   LDLCALC 86 12/18/2023   ALT 14 12/18/2023   AST 16 12/18/2023   NA 141 12/24/2023   K 3.6 12/24/2023   CL 104 12/24/2023   CREATININE 2.15  (H) 12/24/2023   BUN 26 (H) 12/24/2023   CO2 29 12/24/2023   TSH 0.822 12/18/2023   HGBA1C 7.2 (H) 12/01/2023     IMAGES: MRI Right foot 12/02/2023: IMPRESSION: Osteomyelitis of the second toe as above. There is a wound at the tip of the second toe which communicates with the bone. No obvious involvement of the proximal phalanx. Moderate dorsal subcutaneous edema without drainable collection.   EKG: For day of surgery as indicated. EKG 02/10/2020: SB at 53 bpm   CV: RLE Venous US  12/02/2023: IMPRESSION: Deep vein thrombosis limited to the right popliteal and femoral vein which appears mostly chronic however there is a short segment of acute appearing thrombus in the peripheral femoral vein. No evidence of iliocaval extension.  Nuclear stress test 03/02/2020: The left ventricular ejection fraction is mildly decreased (45-54%). Nuclear stress EF: 48%. There is a defect present in the basal inferior, mid inferior and apical inferior location consistent with diaphragmatic attenuation/artifact as appears worse at rest. This is a low risk study.    Past Medical History:  Diagnosis Date   Asthma 04/11/2011   AS CHILD ONLY   BPH (benign prostatic hypertrophy) with urinary obstruction    Carpal tunnel syndrome    patient denies at 05/14/17 preop visit    Difficult intubation 04/11/2011   08-11-97 some issues with intubation/record with chart   DJD (degenerative joint disease)    DM (diabetes mellitus) (HCC) 04/11/2011   tyep II    DVT femoral (deep venous thrombosis) with thrombophlebitis (HCC) 04/11/2011   ?'09-tx. on Xarelton x 3-4 years    GERD (gastroesophageal reflux disease) 04/11/2011   mild, no med in 1 month   Gout 04/11/2011    ankle 2 yrs ago   Hemorrhoids    Hypercholesteremia    Hypertension    Internal hemorrhoids    Keloid 04/11/2011   multiple-arms,back, chest   Pulmonary nodule 04/11/2011   PT. NOT AWARE -denies problems with breathing   Renal  calculus    Thyroid  disease    hypothyroid    Past Surgical History:  Procedure Laterality Date   ABDOMINAL AORTOGRAM W/LOWER EXTREMITY N/A 12/05/2023   Procedure: ABDOMINAL AORTOGRAM W/LOWER EXTREMITY;  Surgeon: Lanis Fonda BRAVO, MD;  Location: [facility] INVASIVE CV LAB;  Service: Cardiovascular;  Laterality: N/A;   CATARACT EXTRACTION W/PHACO Left 10/28/2021   Procedure: CATARACT EXTRACTION PHACO AND INTRAOCULAR LENS PLACEMENT (IOC);  Surgeon: Harrie Agent, MD;  Location: [facility]S;  Service: Ophthalmology;  Laterality: Left;  CDE: 6.61   cataract surgery Right 12/11/2013   COLONOSCOPY WITH PROPOFOL  N/A 02/05/2017   Procedure: COLONOSCOPY WITH PROPOFOL ;  Surgeon: Aneita Gwendlyn DASEN, MD;  Location: [facility];  Service: Endoscopy;  Laterality: N/A;   COSMETIC SURGERY  03/13/1998   Keloid injections   CYSTOSCOPY WITH BIOPSY  04/14/2011   Procedure: CYSTOSCOPY WITH BIOPSY;  Surgeon: Noretta Ferrara, MD;  Location: [facility];  Service: Urology;  Laterality: N/A;  Bladder Biopsies    CYSTOSCOPY/RETROGRADE/URETEROSCOPY  04/14/2011   Procedure: CYSTOSCOPY/RETROGRADE/URETEROSCOPY;  Surgeon: Noretta Ferrara, MD;  Location: [facility];  Service: Urology;  Laterality: Bilateral;  (Bil) RPG    LAPAROSCOPIC NEPHRECTOMY Right 05/21/2017   Procedure: LAPAROSCOPIC RADICAL NEPHRECTOMY;  Surgeon: Renda Glance, MD;  Location: [facility];  Service: Urology;  Laterality: Right;   LOWER EXTREMITY INTERVENTION  12/05/2023   Procedure: LOWER EXTREMITY INTERVENTION;  Surgeon: Lanis Fonda BRAVO, MD;  Location: [facility] INVASIVE CV LAB;  Service: Cardiovascular;;   ortho surgery on fingers  2005 & 2008   Dr. Jearld finger release   PROSTATECTOMY  03/13/1997   Dr. Nicholaus    MEDICATIONS: No current facility-administered medications for this encounter.    amLODipine  (NORVASC ) 10 MG tablet   atorvastatin  (LIPITOR ) 80 MG tablet   Blood Glucose Monitoring Suppl (ACCU-CHEK GUIDE ME) w/Device KIT   clopidogrel  (PLAVIX ) 75 MG tablet    Continuous Blood Gluc Receiver (FREESTYLE LIBRE 2 READER) DEVI   Continuous Glucose Sensor (FREESTYLE LIBRE 2 SENSOR) MISC   cyanocobalamin 1000 MCG tablet   diphenhydrAMINE  (BENADRYL ) 25 MG tablet   donepezil  (ARICEPT ) 5 MG tablet   doxycycline (VIBRA-TABS) 100 MG tablet   fluticasone  (FLONASE ) 50 MCG/ACT nasal spray   gabapentin  (NEURONTIN ) 600 MG tablet   Insulin  NPH Isophane & Regular (NOVOLIN  70/30 FLEXPEN RELION [place])   JARDIANCE  10 MG TABS tablet   metoprolol  tartrate (LOPRESSOR ) 25 MG tablet   pantoprazole  (PROTONIX ) 40 MG tablet   XARELTO  20 MG TABS tablet    Isaiah Ruder, PA-C Surgical Short Stay/Anesthesiology [facility] Phone [phone] [facility] Phone [phone] 12/25/2023 2:50 PM

## 2023-12-25 NOTE — H&P (Signed)
 Hector Rose is an 83 y.o. male.   Chief Complaint: Pain in right foot HPI: Hector Rose is an 83 year old male with osteomyelitis of the right foot distal phalanx who presents for evaluation. He is accompanied by his wife.   He has osteomyelitis of the right foot, specifically the distal phalanx of the second toe. He experiences swelling at the tip of the second toe on the right foot.   He underwent an endovascular intervention on December 05, 2023. His most recent hemoglobin A1c is 7.2.  Past Medical History:  Diagnosis Date   Asthma 04/11/2011   AS CHILD ONLY   BPH (benign prostatic hypertrophy) with urinary obstruction    Carpal tunnel syndrome    patient denies at 05/14/17 preop visit    Difficult intubation 04/11/2011   08-11-97 some issues with intubation/record with chart   DJD (degenerative joint disease)    DM (diabetes mellitus) (HCC) 04/11/2011   tyep II    DVT femoral (deep venous thrombosis) with thrombophlebitis (HCC) 04/11/2011   ?'09-tx. on Xarelton x 3-4 years    GERD (gastroesophageal reflux disease) 04/11/2011   mild, no med in 1 month   Gout 04/11/2011    ankle 2 yrs ago   Hemorrhoids    Hypercholesteremia    Hypertension    Internal hemorrhoids    Keloid 04/11/2011   multiple-arms,back, chest   Pulmonary nodule 04/11/2011   PT. NOT AWARE -denies problems with breathing   Renal calculus    Thyroid  disease    hypothyroid    Past Surgical History:  Procedure Laterality Date   ABDOMINAL AORTOGRAM W/LOWER EXTREMITY N/A 12/05/2023   Procedure: ABDOMINAL AORTOGRAM W/LOWER EXTREMITY;  Surgeon: Lanis Fonda BRAVO, MD;  Location: [facility] INVASIVE CV LAB;  Service: Cardiovascular;  Laterality: N/A;   CATARACT EXTRACTION W/PHACO Left 10/28/2021   Procedure: CATARACT EXTRACTION PHACO AND INTRAOCULAR LENS PLACEMENT (IOC);  Surgeon: Harrie Agent, MD;  Location: [facility];  Service: Ophthalmology;  Laterality: Left;  CDE: 6.61   cataract surgery Right 12/11/2013    COLONOSCOPY WITH PROPOFOL  N/A 02/05/2017   Procedure: COLONOSCOPY WITH PROPOFOL ;  Surgeon: Aneita Gwendlyn DASEN, MD;  [facility];  Service: Endoscopy;  Laterality: N/A;   COSMETIC SURGERY  03/13/1998   Keloid injections   CYSTOSCOPY WITH BIOPSY  04/14/2011   Procedure: CYSTOSCOPY WITH BIOPSY;  Surgeon: Noretta Ferrara, MD;  Location: [facility];  Service: Urology;  Laterality: N/A;  Bladder Biopsies    CYSTOSCOPY/RETROGRADE/URETEROSCOPY  04/14/2011   Procedure: CYSTOSCOPY/RETROGRADE/URETEROSCOPY;  Surgeon: Noretta Ferrara, MD;  Location: [facility];  Service: Urology;  Laterality: Bilateral;  (Bil) RPG    LAPAROSCOPIC NEPHRECTOMY Right 05/21/2017   Procedure: LAPAROSCOPIC RADICAL NEPHRECTOMY;  Surgeon: Ferrara Glance, MD;  Location: [facility];  Service: Urology;  Laterality: Right;   LOWER EXTREMITY INTERVENTION  12/05/2023   Procedure: LOWER EXTREMITY INTERVENTION;  Surgeon: Lanis Fonda BRAVO, MD;  Location: [facility] INVASIVE CV LAB;  Service: Cardiovascular;;   ortho surgery on fingers  2005 & 2008   Dr. Jearld finger release   PROSTATECTOMY  03/13/1997   Dr. Nicholaus    History reviewed. No pertinent family history. Social History:  reports that he has never smoked. He has never used smokeless tobacco. He reports that he does not drink alcohol and does not use drugs.  Allergies:  Allergies  Allergen Reactions   Ace Inhibitors Swelling    Angio edema (swelling of lips) Swelling around face Swelling in mouth   Allopurinol  Itching    Makes feet  fill numb and tingle Pt stated he is not allergic to this 12-02-2023   Metformin  And Related Itching    Itching in higher doses    No medications prior to admission.    Results for orders placed or performed in visit on 12/24/23 (from the past 48 hours)  C-reactive protein     Status: Abnormal   Collection Time: 12/24/23  9:17 AM  Result Value Ref Range   CRP 10.3 (H) <8.0 mg/L  Sedimentation rate     Status: Abnormal   Collection Time: 12/24/23   9:17 AM  Result Value Ref Range   Sed Rate 29 (H) 0 - 20 mm/h  Basic Metabolic Panel Without GFR     Status: Abnormal   Collection Time: 12/24/23  9:17 AM  Result Value Ref Range   Glucose, Bld 198 (H) 65 - 99 mg/dL    Comment: .            Fasting reference interval . For someone without known diabetes, a glucose value >125 mg/dL indicates that they may have diabetes and this should be confirmed with a follow-up test. .    BUN 26 (H) 7 - 25 mg/dL   Creat 7.84 (H) 9.29 - 1.22 mg/dL   BUN/Creatinine Ratio 12 6 - 22 (calc)   Sodium 141 135 - 146 mmol/L   Potassium 3.6 3.5 - 5.3 mmol/L   Chloride 104 98 - 110 mmol/L   CO2 29 20 - 32 mmol/L   Calcium  9.7 8.6 - 10.3 mg/dL  CBC with Differential/Platelet     Status: Abnormal   Collection Time: 12/24/23  9:17 AM  Result Value Ref Range   WBC 9.8 3.8 - 10.8 Thousand/uL   RBC 5.08 4.20 - 5.80 Million/uL   Hemoglobin 15.3 13.2 - 17.1 g/dL   HCT 53.4 61.4 - 49.9 %   MCV 91.5 80.0 - 100.0 fL   MCH 30.1 27.0 - 33.0 pg   MCHC 32.9 32.0 - 36.0 g/dL    Comment: For adults, a slight decrease in the calculated MCHC value (in the range of 30 to 32 g/dL) is most likely not clinically significant; however, it should be interpreted with caution in correlation with other red cell parameters and the patient's clinical condition.    RDW 12.9 11.0 - 15.0 %   Platelets 184 140 - 400 Thousand/uL   MPV 11.0 7.5 - 12.5 fL   Neutro Abs 8,212 (H) 1,500 - 7,800 cells/uL   Absolute Lymphocytes 872 850 - 3,900 cells/uL   Absolute Monocytes 500 200 - 950 cells/uL   Eosinophils Absolute 176 15 - 500 cells/uL   Basophils Absolute 39 0 - 200 cells/uL   Neutrophils Relative % 83.8 %   Total Lymphocyte 8.9 %   Monocytes Relative 5.1 %   Eosinophils Relative 1.8 %   Basophils Relative 0.4 %   No results found.  Review of Systems  All other systems reviewed and are negative.   Height 6' (1.829 m), weight 81.2 kg. Physical Exam  Patient is alert,  oriented, no adenopathy, well-dressed, normal affect, normal respiratory effort.  Lungs non labored  Heart RRR Right second toe distal phalanx osteomyelitis with plantar cellulitis MRI confirmed osteomyelitis of the distal phalanx with plantar cellulitis. No drainage or ascending cellulitis. Strong dorsalis pedis pulse.   Assessment/Plan Right second toe distal phalanx osteomyelitis with plantar cellulitis MRI confirmed osteomyelitis of the distal phalanx with plantar cellulitis. No drainage or ascending cellulitis. Strong dorsalis pedis pulse. -  Right second toe amputation scheduled for Wednesday as outpatient. - Explained risks: wound healing complications, potential need for additional surgery. - Patient and wife consented to surgery.  Maurilio Deland Collet, PA-C 12/25/2023, 6:51 PM

## 2023-12-26 ENCOUNTER — Ambulatory Visit (HOSPITAL_COMMUNITY): Payer: Self-pay | Admitting: Vascular Surgery

## 2023-12-26 ENCOUNTER — Encounter (HOSPITAL_COMMUNITY)
Admission: RE | Disposition: A | Discharge status: Home / Self Care | Payer: Self-pay | Source: Home / Self Care | Attending: Orthopedic Surgery

## 2023-12-26 ENCOUNTER — Ambulatory Visit (HOSPITAL_COMMUNITY)
Admission: RE | Admit: 2023-12-26 | Discharge: 2023-12-26 | Disposition: A | Discharge status: Home / Self Care | Attending: Orthopedic Surgery | Admitting: Orthopedic Surgery

## 2023-12-26 ENCOUNTER — Ambulatory Visit: Admitting: "Endocrinology

## 2023-12-26 ENCOUNTER — Encounter (HOSPITAL_COMMUNITY): Payer: Self-pay | Admitting: Orthopedic Surgery

## 2023-12-26 DIAGNOSIS — Z95820 Peripheral vascular angioplasty status with implants and grafts: Secondary | ICD-10-CM | POA: Diagnosis not present

## 2023-12-26 DIAGNOSIS — Z86718 Personal history of other venous thrombosis and embolism: Secondary | ICD-10-CM | POA: Insufficient documentation

## 2023-12-26 DIAGNOSIS — Z905 Acquired absence of kidney: Secondary | ICD-10-CM | POA: Diagnosis not present

## 2023-12-26 DIAGNOSIS — N184 Chronic kidney disease, stage 4 (severe): Secondary | ICD-10-CM

## 2023-12-26 DIAGNOSIS — Z9079 Acquired absence of other genital organ(s): Secondary | ICD-10-CM | POA: Diagnosis not present

## 2023-12-26 DIAGNOSIS — M868X7 Other osteomyelitis, ankle and foot: Secondary | ICD-10-CM | POA: Insufficient documentation

## 2023-12-26 DIAGNOSIS — E039 Hypothyroidism, unspecified: Secondary | ICD-10-CM | POA: Insufficient documentation

## 2023-12-26 DIAGNOSIS — Z7984 Long term (current) use of oral hypoglycemic drugs: Secondary | ICD-10-CM | POA: Diagnosis not present

## 2023-12-26 DIAGNOSIS — E1169 Type 2 diabetes mellitus with other specified complication: Secondary | ICD-10-CM

## 2023-12-26 DIAGNOSIS — M869 Osteomyelitis, unspecified: Secondary | ICD-10-CM | POA: Diagnosis not present

## 2023-12-26 DIAGNOSIS — I129 Hypertensive chronic kidney disease with stage 1 through stage 4 chronic kidney disease, or unspecified chronic kidney disease: Secondary | ICD-10-CM

## 2023-12-26 DIAGNOSIS — K219 Gastro-esophageal reflux disease without esophagitis: Secondary | ICD-10-CM | POA: Insufficient documentation

## 2023-12-26 DIAGNOSIS — E785 Hyperlipidemia, unspecified: Secondary | ICD-10-CM | POA: Diagnosis not present

## 2023-12-26 DIAGNOSIS — M86171 Other acute osteomyelitis, right ankle and foot: Secondary | ICD-10-CM | POA: Diagnosis not present

## 2023-12-26 DIAGNOSIS — Z794 Long term (current) use of insulin: Secondary | ICD-10-CM | POA: Insufficient documentation

## 2023-12-26 DIAGNOSIS — Z85528 Personal history of other malignant neoplasm of kidney: Secondary | ICD-10-CM | POA: Insufficient documentation

## 2023-12-26 DIAGNOSIS — E1122 Type 2 diabetes mellitus with diabetic chronic kidney disease: Secondary | ICD-10-CM | POA: Insufficient documentation

## 2023-12-26 DIAGNOSIS — E1151 Type 2 diabetes mellitus with diabetic peripheral angiopathy without gangrene: Secondary | ICD-10-CM | POA: Diagnosis not present

## 2023-12-26 HISTORY — PX: AMPUTATION TOE: SHX6595

## 2023-12-26 LAB — GLUCOSE, CAPILLARY
Glucose-Capillary: 108 mg/dL — ABNORMAL HIGH (ref 70–99)
Glucose-Capillary: 97 mg/dL (ref 70–99)
Glucose-Capillary: 107 mg/dL — ABNORMAL HIGH (ref 70–99)

## 2023-12-26 SURGERY — AMPUTATION, TOE
Anesthesia: General | Site: Toe | Laterality: Right

## 2023-12-26 MED ORDER — ONDANSETRON HCL 4 MG/2ML IJ SOLN
INTRAMUSCULAR | Status: AC
Start: 2023-12-26 — End: 2023-12-26
  Filled 2023-12-26: qty 2

## 2023-12-26 MED ORDER — ORAL CARE MOUTH RINSE: 15.0000 mL | Freq: Once | OROMUCOSAL | Status: AC

## 2023-12-26 MED ORDER — OXYCODONE HCL 5 MG PO TABS
ORAL_TABLET | ORAL | Status: AC
  Filled 2023-12-26: qty 1

## 2023-12-26 MED ORDER — OXYCODONE HCL 5 MG PO TABS
5.0000 mg | ORAL_TABLET | Freq: Once | ORAL | Status: AC
  Administered 2023-12-26: 5 mg via ORAL

## 2023-12-26 MED ORDER — CEFAZOLIN SODIUM-DEXTROSE 2-4 GM/100ML-% IV SOLN
2.0000 g | INTRAVENOUS | Status: AC
  Administered 2023-12-26: 2 g via INTRAVENOUS

## 2023-12-26 MED ORDER — OXYCODONE HCL 5 MG PO TABS: 5.0000 mg | ORAL_TABLET | ORAL | 0 refills | Status: AC | PRN

## 2023-12-26 MED ORDER — CHLORHEXIDINE GLUCONATE 0.12 % MT SOLN
OROMUCOSAL | Status: AC
  Administered 2023-12-26: 15 mL via OROMUCOSAL
  Filled 2023-12-26: qty 15

## 2023-12-26 MED ORDER — PROPOFOL 10 MG/ML IV BOLUS
INTRAVENOUS | Status: DC | PRN
  Administered 2023-12-26: 100 mg via INTRAVENOUS
  Administered 2023-12-26 (×2): 50 mg via INTRAVENOUS

## 2023-12-26 MED ORDER — CEFAZOLIN SODIUM-DEXTROSE 2-4 GM/100ML-% IV SOLN
INTRAVENOUS | Status: AC
  Filled 2023-12-26: qty 100

## 2023-12-26 MED ORDER — ACETAMINOPHEN 500 MG PO TABS
ORAL_TABLET | ORAL | Status: AC
  Administered 2023-12-26: 1000 mg via ORAL
  Filled 2023-12-26: qty 2

## 2023-12-26 MED ORDER — LIDOCAINE 2% (20 MG/ML) 5 ML SYRINGE
INTRAMUSCULAR | Status: AC
  Filled 2023-12-26: qty 5

## 2023-12-26 MED ORDER — INSULIN ASPART 100 UNIT/ML IJ SOLN: 0.0000 [IU] | INTRAMUSCULAR | Status: DC | PRN

## 2023-12-26 MED ORDER — FENTANYL CITRATE (PF) 100 MCG/2ML IJ SOLN: 25.0000 ug | INTRAMUSCULAR | Status: DC | PRN

## 2023-12-26 MED ORDER — ONDANSETRON HCL 4 MG/2ML IJ SOLN
INTRAMUSCULAR | Status: DC | PRN
  Administered 2023-12-26: 4 mg via INTRAVENOUS

## 2023-12-26 MED ORDER — DEXAMETHASONE SOD PHOSPHATE PF 10 MG/ML IJ SOLN
INTRAMUSCULAR | Status: DC | PRN
  Administered 2023-12-26: 5 mg via INTRAVENOUS

## 2023-12-26 MED ORDER — EPHEDRINE SULFATE-NACL 50-0.9 MG/10ML-% IV SOSY
PREFILLED_SYRINGE | INTRAVENOUS | Status: DC | PRN
  Administered 2023-12-26: 5 mg via INTRAVENOUS

## 2023-12-26 MED ORDER — LIDOCAINE 2% (20 MG/ML) 5 ML SYRINGE
INTRAMUSCULAR | Status: DC | PRN
  Administered 2023-12-26: 80 mg via INTRAVENOUS
  Administered 2023-12-26: 20 mg via INTRAVENOUS

## 2023-12-26 MED ORDER — VASHE WOUND IRRIGATION OPTIME
TOPICAL | Status: DC | PRN
  Administered 2023-12-26: 34 [oz_av]

## 2023-12-26 MED ORDER — CHLORHEXIDINE GLUCONATE 0.12 % MT SOLN: 15.0000 mL | Freq: Once | OROMUCOSAL | Status: AC

## 2023-12-26 MED ORDER — ONDANSETRON HCL 4 MG/2ML IJ SOLN: 4.0000 mg | Freq: Once | INTRAMUSCULAR | Status: DC | PRN

## 2023-12-26 MED ORDER — ACETAMINOPHEN 500 MG PO TABS: 1000.0000 mg | ORAL_TABLET | Freq: Once | ORAL | Status: AC

## 2023-12-26 MED ORDER — SUCCINYLCHOLINE CHLORIDE 200 MG/10ML IV SOSY
PREFILLED_SYRINGE | INTRAVENOUS | Status: DC | PRN
  Administered 2023-12-26: 60 mg via INTRAVENOUS

## 2023-12-26 MED ORDER — SODIUM CHLORIDE 0.9 % IV SOLN: INTRAVENOUS | Status: DC

## 2023-12-26 MED ORDER — PROPOFOL 10 MG/ML IV BOLUS
INTRAVENOUS | Status: AC
  Filled 2023-12-26: qty 20

## 2023-12-26 SURGICAL SUPPLY — 30 items
BAG COUNTER SPONGE SURGICOUNT (BAG) ×1 IMPLANT
BLADE SURG 21 STRL SS (BLADE) ×1 IMPLANT
BNDG COHESIVE 4X5 TAN STRL LF (GAUZE/BANDAGES/DRESSINGS) ×1 IMPLANT
BNDG COHESIVE 6X5 TAN NS LF (GAUZE/BANDAGES/DRESSINGS) IMPLANT
BNDG GAUZE DERMACEA FLUFF 4 (GAUZE/BANDAGES/DRESSINGS) ×1 IMPLANT
CLEANSER WND VASHE 34 (WOUND CARE) IMPLANT
COVER SURGICAL LIGHT HANDLE (MISCELLANEOUS) ×1 IMPLANT
DRAPE U-SHAPE 47X51 STRL (DRAPES) ×1 IMPLANT
DRSG ADAPTIC 3X8 NADH LF (GAUZE/BANDAGES/DRESSINGS) IMPLANT
DURAPREP 26ML APPLICATOR (WOUND CARE) ×1 IMPLANT
ELECTRODE REM PT RTRN 9FT ADLT (ELECTROSURGICAL) ×1 IMPLANT
GAUZE PAD ABD 8X10 STRL (GAUZE/BANDAGES/DRESSINGS) ×1 IMPLANT
GAUZE SPONGE 4X4 12PLY STRL (GAUZE/BANDAGES/DRESSINGS) IMPLANT
GAUZE SPONGE 4X4 12PLY STRL LF (GAUZE/BANDAGES/DRESSINGS) IMPLANT
GLOVE BIOGEL PI IND STRL 9 (GLOVE) ×1 IMPLANT
GLOVE SURG ORTHO 9.0 STRL STRW (GLOVE) ×1 IMPLANT
GOWN STRL REUS W/ TWL XL LVL3 (GOWN DISPOSABLE) ×2 IMPLANT
KIT BASIN OR (CUSTOM PROCEDURE TRAY) ×1 IMPLANT
KIT TURNOVER KIT B (KITS) ×1 IMPLANT
MANIFOLD NEPTUNE II (INSTRUMENTS) ×1 IMPLANT
NDL 22X1.5 STRL (OR ONLY) (MISCELLANEOUS) IMPLANT
NEEDLE 22X1.5 STRL (OR ONLY) (MISCELLANEOUS) IMPLANT
PACK ORTHO EXTREMITY (CUSTOM PROCEDURE TRAY) ×1 IMPLANT
PAD ABD 8X10 STRL (GAUZE/BANDAGES/DRESSINGS) IMPLANT
PAD ARMBOARD POSITIONER FOAM (MISCELLANEOUS) ×2 IMPLANT
SOLN 0.9% NACL 1000 ML (IV SOLUTION) IMPLANT
SOLN 0.9% NACL POUR BTL 1000ML (IV SOLUTION) ×1 IMPLANT
SUT ETHILON 2 0 PSLX (SUTURE) ×1 IMPLANT
SYR CONTROL 10ML LL (SYRINGE) IMPLANT
TOWEL GREEN STERILE (TOWEL DISPOSABLE) ×1 IMPLANT

## 2023-12-26 NOTE — Transfer of Care (Signed)
 Immediate Anesthesia Transfer of Care Note  Patient: Hector Rose  Procedure(s) Performed: AMPUTATION, TOE (Right: Toe)  Patient Location: PACU  Anesthesia Type:General  Level of Consciousness: awake, alert , oriented, and patient cooperative  Airway & Oxygen Therapy: Patient Spontanous Breathing and Patient connected to face mask oxygen  Post-op Assessment: Report given to RN and Post -op Vital signs reviewed and stable  Post vital signs: Reviewed and stable  Last Vitals:  Vitals Value Taken Time  BP    Temp    Pulse 74 12/26/23 13:10  Resp 17 12/26/23 13:10  SpO2 100 % 12/26/23 13:10  Vitals shown include unfiled device data.  Last Pain:  Vitals:   12/26/23 1024  TempSrc:   PainSc: 0-No pain         Complications: No notable events documented.

## 2023-12-26 NOTE — Interval H&P Note (Signed)
 History and Physical Interval Note:  12/26/2023 12:14 PM  Hector Rose  has presented today for surgery, with the diagnosis of Osteomyelitis Right 2nd Toe.  The various methods of treatment have been discussed with the patient and family. After consideration of risks, benefits and other options for treatment, the patient has consented to  Procedure(s) with comments: AMPUTATION, TOE (Right) - RIGHT 2ND TOE AMPUTATION as a surgical intervention.  The patient's history has been reviewed, patient examined, no change in status, stable for surgery.  I have reviewed the patient's chart and labs.  Questions were answered to the patient's satisfaction.     Brae Gartman V Kelee Cunningham

## 2023-12-26 NOTE — Anesthesia Postprocedure Evaluation (Signed)
 Anesthesia Post Note  Patient: Hector Rose  Procedure(s) Performed: AMPUTATION, TOE (Right: Toe)     Patient location during evaluation: PACU Anesthesia Type: General Level of consciousness: awake and alert Pain management: pain level controlled Vital Signs Assessment: post-procedure vital signs reviewed and stable Respiratory status: spontaneous breathing, nonlabored ventilation and respiratory function stable Cardiovascular status: blood pressure returned to baseline and stable Postop Assessment: no apparent nausea or vomiting Anesthetic complications: no   No notable events documented.  Last Vitals:  Vitals:   12/26/23 1430 12/26/23 1445  BP: 137/64 137/74  Pulse: (!) 59 64  Resp: (!) 25 (!) 22  Temp: 36.5 C   SpO2: 98% 98%    Last Pain:  Vitals:   12/26/23 1430  TempSrc:   PainSc: 2                  Garnette FORBES Skillern

## 2023-12-26 NOTE — Op Note (Signed)
 12/26/2023  12:51 PM  PATIENT:  Hector Rose    PRE-OPERATIVE DIAGNOSIS:  Osteomyelitis Right 2nd Toe  POST-OPERATIVE DIAGNOSIS:  Same  PROCEDURE:  AMPUTATION, TOE, right second toe  SURGEON:  Jerona LULLA Sage, MD  PHYSICIAN ASSISTANT:None ANESTHESIA:   General  PREOPERATIVE INDICATIONS:  Hector Rose is a  83 y.o. male with a diagnosis of Osteomyelitis Right 2nd Toe who failed conservative measures and elected for surgical management.    The risks benefits and alternatives were discussed with the patient preoperatively including but not limited to the risks of infection, bleeding, nerve injury, cardiopulmonary complications, the need for revision surgery, among others, and the patient was willing to proceed.  OPERATIVE IMPLANTS:   * No implants in log *  @ENCIMAGES @  OPERATIVE FINDINGS: Tissue margins were clear 60  OPERATIVE PROCEDURE: Patient was brought the operating room and underwent a general endotracheal anesthetic.  After adequate levels anesthesia were obtained patient's right lower extremity was prepped using DuraPrep draped into a sterile field a timeout was called.  If the incision was made around the second toe and the second toe was amputated through the MTP joint.  Electrocautery was used for hemostasis.  The wound was irrigated with Vashe.  Tissue margins were clear there was good petechial bleeding.  The incision was closed using 2-0 nylon.  Sterile dressing was applied patient was extubated taken the PACU in stable condition.   DISCHARGE PLANNING:  Antibiotic duration: Preoperative antibiotics  Weightbearing: Weightbearing on the heel  Pain medication: Prescription for Percocet  Dressing care/ Wound VAC: Dry dressing  Ambulatory devices: Walker or crutches  Discharge to: Home.  Follow-up: In the office 1 week post operative.

## 2023-12-26 NOTE — Progress Notes (Signed)
 Orthopedic Tech Progress Note Patient Details:  Hector Rose 07/22/1940 [id number]  Ortho Devices Type of Ortho Device: Postop shoe/boot Ortho Device/Splint Location: RLE Ortho Device/Splint Interventions: Ordered, Application, Adjustment   Post Interventions Patient Tolerated: Well Instructions Provided: Care of device  Delanna LITTIE Pac 12/26/2023, 1:28 PM

## 2023-12-26 NOTE — Discharge Instructions (Addendum)
 Keep dressing clean and dry until follow up.  Elevate your foot to decrease swelling and pain.  Keep the dressing clean and dry.    Weightbearing: Weightbearing on the heel   Pain medication: Prescription for Percocet   Dressing care/ Wound VAC: Dry dressing   Ambulatory devices: Walker or crutches   Discharge to: Home.   Follow-up: In the office 1 week post operative.

## 2023-12-26 NOTE — Anesthesia Procedure Notes (Signed)
 Procedure Name: Intubation Date/Time: 12/26/2023 12:33 PM  Performed by: Erick Fitz, CRNAPre-anesthesia Checklist: Patient identified, Emergency Drugs available, Suction available, Patient being monitored and Timeout performed Patient Re-evaluated:Patient Re-evaluated prior to induction Oxygen Delivery Method: Circle system utilized Preoxygenation: Pre-oxygenation with 100% oxygen Induction Type: IV induction Ventilation: Mask ventilation without difficulty Laryngoscope Size: Glidescope and 4 Grade View: Grade II Tube type: Oral Tube size: 7.0 mm Number of attempts: 2 Airway Equipment and Method: Stylet Placement Confirmation: ETT inserted through vocal cords under direct vision, positive ETCO2, CO2 detector and breath sounds checked- equal and bilateral Secured at: 23 cm Tube secured with: taped with 1/2 inch silk tape. Dental Injury: Teeth and Oropharynx as per pre-operative assessment  Difficulty Due To: Difficulty was unanticipated Future Recommendations: Recommend- induction with short-acting agent, and alternative techniques readily available Comments: Attempts x 2 with LMA # 4 placement, Dr. Corinne assisted; no ETCO2 noted, reseated LMA, still no ETCO2; attempt x 1 with #5LMA, no ETCO2 noted; Dr. Corinne anesthesia care plan changed to ETT; GlideScope #7.0 ETT, 1st pass with GlideScope

## 2023-12-27 ENCOUNTER — Encounter (HOSPITAL_COMMUNITY): Payer: Self-pay | Admitting: Orthopedic Surgery

## 2024-01-02 ENCOUNTER — Encounter: Admitting: Physician Assistant

## 2024-01-03 ENCOUNTER — Ambulatory Visit (INDEPENDENT_AMBULATORY_CARE_PROVIDER_SITE_OTHER): Admitting: Physician Assistant

## 2024-01-03 ENCOUNTER — Encounter: Payer: Self-pay | Admitting: Physician Assistant

## 2024-01-03 DIAGNOSIS — Z89421 Acquired absence of other right toe(s): Secondary | ICD-10-CM

## 2024-01-03 DIAGNOSIS — S98131A Complete traumatic amputation of one right lesser toe, initial encounter: Secondary | ICD-10-CM

## 2024-01-03 NOTE — Progress Notes (Signed)
 Office Visit Note   Patient: Hector Rose           Date of Birth: 1941/02/04           MRN: [id number] Visit Date: 01/03/2024              Requested by: Marvine Rush, MD [address] [place],  [place] [address] PCP: Marvine Rush, MD  Chief Complaint  Patient presents with   Right Foot - Routine Post Op    12/26/2023 right 2nd toe amputation      HPI: 83 y/o male with history of Osteomyelitis Right 2nd Toe s/p second toe amputation on 12/26/23.  He is here for his first post op follow up visit. His wife is with him today.    They have kept the dressing clean and dry.  Elevation multiple times a day.  Minimal ambulation with post op shoe and heel weight bearing.  He denies much pain, no fever or chills.  He is on oral Doxycycline BID.    Assessment & Plan: Visit Diagnoses:  1. Amputation of toe of right foot     Plan:  Vashe incisional cleaning daily, dry dressing.  He may shower PRN with dial soap.  Elevation and limited ambulation to ADL's to decrease edema.    Follow-Up Instructions: Return in about 2 weeks (around 01/17/2024).   Ortho Exam  Patient is alert, oriented, no adenopathy, well-dressed, normal affect, normal respiratory effort. Sutures are intact.  No ischemic skin changes.  Right foot appears viable.  No abnormal edema, no cellulitis and no drainage.  Palpable DP pulse B LE.  He does have onychomycosis of multiple toes.      Imaging: No results found. No images are attached to the encounter.  Labs: Lab Results  Component Value Date   HGBA1C 7.2 (H) 12/01/2023   HGBA1C 7.8 (A) 08/24/2023   HGBA1C 8.1 (A) 12/21/2022   ESRSEDRATE 29 (H) 12/24/2023   ESRSEDRATE 67 (H) 12/01/2023   CRP 10.3 (H) 12/24/2023   CRP 2.0 (H) 12/01/2023   LABURIC 6.5 03/25/2008   LABURIC 6.1 09/26/2007   LABURIC 4.9 06/05/2007   REPTSTATUS 12/07/2023 FINAL 12/03/2023   GRAMSTAIN  12/03/2023    NO WBC SEEN NO ORGANISMS SEEN Performed at [facility], [address], [place], [place] [address]    CULT FEW STAPHYLOCOCCUS EPIDERMIDIS 12/03/2023   [facility] STAPHYLOCOCCUS EPIDERMIDIS 12/03/2023     Lab Results  Component Value Date   ALBUMIN 4.4 12/18/2023   ALBUMIN 3.1 (L) 12/02/2023   ALBUMIN 4.1 04/17/2023    Lab Results  Component Value Date   MG 2.3 12/02/2023   Lab Results  Component Value Date   VD25OH 30.7 10/29/2019   VD25OH 12 (L) 03/19/2017   VD25OH 27 (L) 03/04/2015    No results found for: PREALBUMIN    Latest Ref Rng & Units 12/24/2023    9:17 AM 12/06/2023    2:58 AM 12/05/2023    5:31 AM  CBC EXTENDED  WBC 3.8 - 10.8 Thousand/uL 9.8  9.2  7.5   RBC 4.20 - 5.80 Million/uL 5.08  4.31  4.85   Hemoglobin 13.2 - 17.1 g/dL 84.6  86.8  85.2   HCT 38.5 - 50.0 % 46.5  38.5  42.8   Platelets 140 - 400 Thousand/uL 184  176  206   NEUT# 1,500 - 7,800 cells/uL 8,212        There is no height or weight on  file to calculate BMI.  Orders:  No orders of the defined types were placed in this encounter.  No orders of the defined types were placed in this encounter.    Procedures: No procedures performed  Clinical Data: No additional findings.  ROS:  All other systems negative, except as noted in the HPI. Review of Systems  Objective: Vital Signs: There were no vitals taken for this visit.  Specialty Comments:  No specialty comments available.  PMFS History: Patient Active Problem List   Diagnosis Date Noted   Cellulitis 12/06/2023   Osteomyelitis of second toe of right foot (HCC) 12/02/2023   Type 2 diabetes mellitus with hyperglycemia (HCC) 12/02/2023   CKD (chronic kidney disease), stage IV (HCC) 12/02/2023   Swelling of right lower extremity 12/02/2023   Insulin  long-term use (HCC) 08/24/2023   Multinodular goiter 04/29/2020   S/p nephrectomy 02/04/2018   Neoplasm of right kidney 05/21/2017   Vitamin D  deficiency 03/28/2017   Special screening for malignant neoplasms, colon    Benign neoplasm of  transverse colon    History of DVT (deep vein thrombosis) 07/20/2016   Renal lesion 07/20/2016   Type 2 diabetes mellitus with stage 3a chronic kidney disease, with long-term current use of insulin  (HCC) 01/15/2014   Chronic idiopathic gout of multiple sites 01/15/2014   Calcified pleural plaque on chest x-ray 06/30/2013   Tinea pedis 09/28/2011   Hematuria 05/19/2011   HEMORRHOIDS 05/29/2007   Pulmonary nodule 03/27/2007   GERD (gastroesophageal reflux disease) 03/27/2007   Benign prostatic hyperplasia with urinary obstruction 03/27/2007   KELOID 03/27/2007   Mixed hyperlipidemia 01/24/2007   CARPAL TUNNEL SYNDROME 01/24/2007   Essential hypertension 01/24/2007   RENAL CALCULUS 01/24/2007   Osteoarthritis 01/24/2007   Past Medical History:  Diagnosis Date   Asthma 04/11/2011   AS CHILD ONLY   BPH (benign prostatic hypertrophy) with urinary obstruction    Carpal tunnel syndrome    patient denies at 05/14/17 preop visit    Difficult intubation 04/11/2011   08-11-97 some issues with intubation/record with chart   DJD (degenerative joint disease)    DM (diabetes mellitus) (HCC) 04/11/2011   tyep II    DVT femoral (deep venous thrombosis) with thrombophlebitis (HCC) 04/11/2011   ?'09-tx. on Xarelton x 3-4 years    GERD (gastroesophageal reflux disease) 04/11/2011   mild, no med in 1 month   Gout 04/11/2011    ankle 2 yrs ago   Hemorrhoids    Hypercholesteremia    Hypertension    Internal hemorrhoids    Keloid 04/11/2011   multiple-arms,back, chest   Pulmonary nodule 04/11/2011   PT. NOT AWARE -denies problems with breathing   Renal calculus    Thyroid  disease    hypothyroid    History reviewed. No pertinent family history.  Past Surgical History:  Procedure Laterality Date   ABDOMINAL AORTOGRAM W/LOWER EXTREMITY N/A 12/05/2023   Procedure: ABDOMINAL AORTOGRAM W/LOWER EXTREMITY;  Surgeon: Lanis Fonda BRAVO, MD;  Location[facility] INVASIVE CV LAB;  Service: Cardiovascular;   Laterality: N/A;   AMPUTATION TOE Right 12/26/2023   Procedure: AMPUTATION, TOE;  Surgeon: Harden Jerona GAILS, MD;  Location: [facility] OR;  Service: Orthopedics;  Laterality: Right;  RIGHT 2ND TOE AMPUTATION   CATARACT EXTRACTION W/PHACO Left 10/28/2021   Procedure: CATARACT EXTRACTION PHACO AND INTRAOCULAR LENS PLACEMENT (IOC);  Surgeon: Harrie Agent, MD;  Location: [facility] ORS;  Service: Ophthalmology;  Laterality: Left;  CDE: 6.61   cataract surgery Right 12/11/2013   COLONOSCOPY WITH PROPOFOL   N/A 02/05/2017   Procedure: COLONOSCOPY WITH PROPOFOL ;  Surgeon: Aneita Gwendlyn DASEN, MD;  Location: [facility];  Service: Endoscopy;  Laterality: N/A;   COSMETIC SURGERY  03/13/1998   Keloid injections   CYSTOSCOPY WITH BIOPSY  04/14/2011   Procedure: CYSTOSCOPY WITH BIOPSY;  Surgeon: Noretta Ferrara, MD;  Location: [facility] ORS;  Service: Urology;  Laterality: N/A;  Bladder Biopsies    CYSTOSCOPY/RETROGRADE/URETEROSCOPY  04/14/2011   Procedure: CYSTOSCOPY/RETROGRADE/URETEROSCOPY;  Surgeon: Noretta Ferrara, MD;  Location: [facility] ORS;  Service: Urology;  Laterality: Bilateral;  (Bil) RPG    LAPAROSCOPIC NEPHRECTOMY Right 05/21/2017   Procedure: LAPAROSCOPIC RADICAL NEPHRECTOMY;  Surgeon: Ferrara Glance, MD;  Location: [facility];  Service: Urology;  Laterality: Right;   LOWER EXTREMITY INTERVENTION  12/05/2023   Procedure: LOWER EXTREMITY INTERVENTION;  Surgeon: Lanis Fonda BRAVO, MD;  Location: [facility] INVASIVE CV LAB;  Service: Cardiovascular;;   ortho surgery on fingers  2005 & 2008   Dr. Jearld finger release   PROSTATECTOMY  03/13/1997   Dr. Nicholaus   Social History   Occupational History   Occupation: retired from [employer]  Tobacco Use   Smoking status: Never   Smokeless tobacco: Never  Vaping Use   Vaping status: Never Used  Substance and Sexual Activity   Alcohol use: No    Alcohol/week: 0.0 standard drinks of alcohol   Drug use: No   Sexual activity: Yes

## 2024-01-08 DIAGNOSIS — M2012 Hallux valgus (acquired), left foot: Secondary | ICD-10-CM | POA: Diagnosis not present

## 2024-01-08 DIAGNOSIS — I739 Peripheral vascular disease, unspecified: Secondary | ICD-10-CM | POA: Diagnosis not present

## 2024-01-08 DIAGNOSIS — E1151 Type 2 diabetes mellitus with diabetic peripheral angiopathy without gangrene: Secondary | ICD-10-CM | POA: Diagnosis not present

## 2024-01-08 DIAGNOSIS — M2042 Other hammer toe(s) (acquired), left foot: Secondary | ICD-10-CM | POA: Diagnosis not present

## 2024-01-08 DIAGNOSIS — Z899 Acquired absence of limb, unspecified: Secondary | ICD-10-CM | POA: Diagnosis not present

## 2024-01-08 DIAGNOSIS — B351 Tinea unguium: Secondary | ICD-10-CM | POA: Diagnosis not present

## 2024-01-08 DIAGNOSIS — M2041 Other hammer toe(s) (acquired), right foot: Secondary | ICD-10-CM | POA: Diagnosis not present

## 2024-01-08 DIAGNOSIS — L84 Corns and callosities: Secondary | ICD-10-CM | POA: Diagnosis not present

## 2024-01-08 DIAGNOSIS — L603 Nail dystrophy: Secondary | ICD-10-CM | POA: Diagnosis not present

## 2024-01-08 DIAGNOSIS — M2011 Hallux valgus (acquired), right foot: Secondary | ICD-10-CM | POA: Diagnosis not present

## 2024-01-08 DIAGNOSIS — E1142 Type 2 diabetes mellitus with diabetic polyneuropathy: Secondary | ICD-10-CM | POA: Diagnosis not present

## 2024-01-08 DIAGNOSIS — G603 Idiopathic progressive neuropathy: Secondary | ICD-10-CM | POA: Diagnosis not present

## 2024-01-11 DIAGNOSIS — M1A09X0 Idiopathic chronic gout, multiple sites, without tophus (tophi): Secondary | ICD-10-CM | POA: Diagnosis not present

## 2024-01-11 DIAGNOSIS — E1165 Type 2 diabetes mellitus with hyperglycemia: Secondary | ICD-10-CM | POA: Diagnosis not present

## 2024-01-11 DIAGNOSIS — I10 Essential (primary) hypertension: Secondary | ICD-10-CM | POA: Diagnosis not present

## 2024-01-11 DIAGNOSIS — Z6824 Body mass index (BMI) 24.0-24.9, adult: Secondary | ICD-10-CM | POA: Diagnosis not present

## 2024-01-11 DIAGNOSIS — Z86718 Personal history of other venous thrombosis and embolism: Secondary | ICD-10-CM | POA: Diagnosis not present

## 2024-01-11 DIAGNOSIS — Z905 Acquired absence of kidney: Secondary | ICD-10-CM | POA: Diagnosis not present

## 2024-01-11 DIAGNOSIS — M869 Osteomyelitis, unspecified: Secondary | ICD-10-CM | POA: Diagnosis not present

## 2024-01-14 ENCOUNTER — Encounter: Payer: Self-pay | Admitting: Radiology

## 2024-01-15 ENCOUNTER — Other Ambulatory Visit: Payer: Self-pay

## 2024-01-15 ENCOUNTER — Ambulatory Visit: Payer: Self-pay | Admitting: Internal Medicine

## 2024-01-15 ENCOUNTER — Encounter: Payer: Self-pay | Admitting: Internal Medicine

## 2024-01-15 VITALS — BP 123/82 | HR 70 | Temp 98.0°F | Ht 73.0 in | Wt 178.0 lb

## 2024-01-15 DIAGNOSIS — E11628 Type 2 diabetes mellitus with other skin complications: Secondary | ICD-10-CM

## 2024-01-15 DIAGNOSIS — L089 Local infection of the skin and subcutaneous tissue, unspecified: Secondary | ICD-10-CM

## 2024-01-15 NOTE — Patient Instructions (Signed)
 With dm2, and the best of self care, once the om happens, you could have new infection any time and can continue routine podiatric care for early recognition and treatment   At this time there is no longer any infection in the tissue or the bone. The incision will just need time to heal   Follow up with ortho as discussed   No further antibiotics needed

## 2024-01-15 NOTE — Progress Notes (Signed)
 [facility]   Patient Active Problem List   Diagnosis Date Noted   Cellulitis 12/06/2023   Osteomyelitis of second toe of right foot (HCC) 12/02/2023   Type 2 diabetes mellitus with hyperglycemia (HCC) 12/02/2023   CKD (chronic kidney disease), stage IV (HCC) 12/02/2023   Swelling of right lower extremity 12/02/2023   Insulin  long-term use (HCC) 08/24/2023   Multinodular goiter 04/29/2020   S/p nephrectomy 02/04/2018   Neoplasm of right kidney 05/21/2017   Vitamin D  deficiency 03/28/2017   Special screening for malignant neoplasms, colon    Benign neoplasm of transverse colon    History of DVT (deep vein thrombosis) 07/20/2016   Renal lesion 07/20/2016   Type 2 diabetes mellitus with stage 3a chronic kidney disease, with long-term current use of insulin  (HCC) 01/15/2014   Chronic idiopathic gout of multiple sites 01/15/2014   Calcified pleural plaque on chest x-ray 06/30/2013   Tinea pedis 09/28/2011   Hematuria 05/19/2011   HEMORRHOIDS 05/29/2007   Pulmonary nodule 03/27/2007   GERD (gastroesophageal reflux disease) 03/27/2007   Benign prostatic hyperplasia with urinary obstruction 03/27/2007   KELOID 03/27/2007   Mixed hyperlipidemia 01/24/2007   CARPAL TUNNEL SYNDROME 01/24/2007   Essential hypertension 01/24/2007   RENAL CALCULUS 01/24/2007   Osteoarthritis 01/24/2007      HPI: Hector Rose is a 83 y.o. male here for follow up right 2nd toe dm2 foot infection  Has been seeing dr Fleeta dam Mri showed distal erosion of 2nd toe On doxy and ultimately got 2nd toe disarticulation  Non-smoker  01/03/24 ortho clinic f/u -- continue vashe dressing change; may shower prn with soap; limited amulbation; f/u 2 weeks  Review of Systems: ROS All other ros negative      Past Medical History:  Diagnosis Date   Asthma 04/11/2011   AS CHILD ONLY   BPH (benign prostatic hypertrophy) with urinary obstruction    Carpal tunnel syndrome     patient denies at 05/14/17 preop visit    Difficult intubation 04/11/2011   08-11-97 some issues with intubation/record with chart   DJD (degenerative joint disease)    DM (diabetes mellitus) (HCC) 04/11/2011   tyep II    DVT femoral (deep venous thrombosis) with thrombophlebitis (HCC) 04/11/2011   ?'09-tx. on Xarelton x 3-4 years    GERD (gastroesophageal reflux disease) 04/11/2011   mild, no med in 1 month   Gout 04/11/2011    ankle 2 yrs ago   Hemorrhoids    Hypercholesteremia    Hypertension    Internal hemorrhoids    Keloid 04/11/2011   multiple-arms,back, chest   Pulmonary nodule 04/11/2011   PT. NOT AWARE -denies problems with breathing   Renal calculus    Thyroid  disease    hypothyroid    Social History   Tobacco Use   Smoking status: Never   Smokeless tobacco: Never  Vaping Use   Vaping status: Never Used  Substance Use Topics   Alcohol use: No    Alcohol/week: 0.0 standard drinks of alcohol   Drug use: No    No family history on file.  Allergies  Allergen Reactions   Ace Inhibitors Swelling    Angio edema (swelling of lips) Swelling around face Swelling in mouth   Allopurinol  Itching    Makes feet fill numb and tingle Pt stated he is not allergic to this 12-02-2023   Metformin  And Related Itching    Itching in higher doses  OBJECTIVE: Vitals:   01/15/24 0932  BP: 123/82  Pulse: 70  Temp: 98 F (36.7 C)  TempSrc: Temporal  SpO2: 96%  Weight: 178 lb (80.7 kg)  Height: 6' 1 (1.854 m)   Body mass index is 23.48 kg/m.   Physical Exam General/constitutional: no distress, pleasant HEENT: Normocephalic, PER, Conj Clear, EOMI, Oropharynx clear Neck supple CV: rrr no mrg Lungs: clear to auscultation, normal respiratory effort Abd: Soft, Nontender Ext: no edema Skin: No Rash Neuro: nonfocal MSK: sutures in place; dry eschar at wound no purulence/swelling/tenderness     Lab: Lab Results  Component Value Date   WBC 9.8 12/24/2023    HGB 15.3 12/24/2023   HCT 46.5 12/24/2023   MCV 91.5 12/24/2023   PLT 184 12/24/2023   Last metabolic panel Lab Results  Component Value Date   GLUCOSE 198 (H) 12/24/2023   NA 141 12/24/2023   K 3.6 12/24/2023   CL 104 12/24/2023   CO2 29 12/24/2023   BUN 26 (H) 12/24/2023   CREATININE 2.15 (H) 12/24/2023   EGFR 32 (L) 12/18/2023   CALCIUM  9.7 12/24/2023   PHOS 2.5 12/02/2023   PROT 7.2 12/18/2023   ALBUMIN 4.4 12/18/2023   LABGLOB 2.8 12/18/2023   AGRATIO 1.6 01/12/2022   BILITOT 0.7 12/18/2023   ALKPHOS 95 12/18/2023   AST 16 12/18/2023   ALT 14 12/18/2023   ANIONGAP 12 12/06/2023   Lab Results  Component Value Date   CRP 10.3 (H) 12/24/2023   Lab Results  Component Value Date   ESRSEDRATE 29 (H) 12/24/2023    Microbiology:  Serology:  Imaging:   Assessment/plan: Problem List Items Addressed This Visit   None Visit Diagnoses       Diabetic foot infection (HCC)    -  Primary       83 yo male dm2 with chronic right 2nd toe om on doxycycline ultimately had I&D and amputation Previous mri showed erosion tip of 2nd toe Vascular surgery performed angiogram for critical limb ischemia previously   Right 2nd toe amputation 12/26/23. Reviewed operative note Amputated through the MTP joint  Essentially this represent clear margin. No culture was checked  On doxycycline currently    From what I have in front of me, his infection had been cleared at surgical date on 12/26/23 He can stop abx today I really see no reason to follow up id   F/u ortho and podiatry as previously arranged  Did discuss with him that with dm2, and the best of self care, once the om happens, he could have new infection any time and can continue routine podiatric care for early recognition and treatment     Follow-up: No follow-ups on file.  Constance ONEIDA Passer, MD [facility] 01/15/2024, 9:44 AM

## 2024-01-17 ENCOUNTER — Ambulatory Visit (HOSPITAL_COMMUNITY): Attending: Family Medicine

## 2024-01-17 ENCOUNTER — Encounter: Payer: Self-pay | Admitting: Physician Assistant

## 2024-01-17 ENCOUNTER — Ambulatory Visit: Admitting: Physician Assistant

## 2024-01-17 ENCOUNTER — Encounter (HOSPITAL_COMMUNITY)

## 2024-01-17 ENCOUNTER — Ambulatory Visit (HOSPITAL_COMMUNITY)

## 2024-01-17 DIAGNOSIS — S98131A Complete traumatic amputation of one right lesser toe, initial encounter: Secondary | ICD-10-CM

## 2024-01-17 DIAGNOSIS — Z89421 Acquired absence of other right toe(s): Secondary | ICD-10-CM

## 2024-01-17 NOTE — Progress Notes (Deleted)
 Ellouise Console, PA-C [address] [place], [place]  [address] Phone: [phone]   Primary Care Physician: Marvine Rush, MD  Primary Gastroenterologist:  Ellouise Console, PA-C / ***  Chief Complaint: Follow-up dysphagia       HPI:   Discussed the use of AI scribe software for clinical note transcription with the patient, who gave verbal consent to proceed.  83 year old male returns for 81-month follow-up of dysphagia.  He has history of chronic dysphagia x 10 years and GERD.  Currently taking pantoprazole  40 mg once daily.  11/26/2023 barium swallow with tablet: 1. 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 2.  Mild lower esophageal dysmotility. 3.  No spontaneous GERD.  No masses, stricture, or hiatal hernia.  No aspiration.  History of Present Illness    The patient was seen at [facility] in 2022.  He underwent EGD with dilation (for dysphagia) at [facility] in 08/2020 (Dr. Lonni Phillips).  Multiple concentric rings were noted above the GE junction. Stomach and duodenum appeared normal.  No biopsies.  During procedure he had bleeding from a Mallory-Weiss tear which required intubation due to laryngeal spasm.  1 clip placed for hemostasis.  Patient was transferred to [facility] for overnight monitoring.  Hemoglobin 13.7 at discharge.  He had no significant anemia or GI bleeding.  Treated with Protonix  40 mg twice daily.    05/2016 barium swallow with tablet (to evaluate dysphagia):  Laryngeal penetration without aspiration.  Mild diffuse esophageal dysmotility.  No esophageal mass or stricture.   01/2017 last colonoscopy by Dr. Aneita: 4 small (5 mm to 6 mm) polyps removed.  Small internal hemorrhoids.  Good prep.  Pathology showed 2 tubular adenomas, 1 hyperplastic, and 1 colon mucosal polyp.  No further colonoscopies are recommended due to advanced age.   PMH: History of DVT, type 2 diabetes, CKD 3,  hypertension, right kidney cancer s/p nephrectomy, GERD, and hemorrhoids.  S/p right toe amputation 12/26/2023 for diabetic osteomyelitis. Currently on (Xarelto  or Plavix ) and 81 mg aspirin  daily.     Current Outpatient Medications  Medication Sig Dispense Refill   amLODipine  (NORVASC ) 10 MG tablet Take 1 tablet (10 mg total) by mouth every morning. 90 tablet 0   atorvastatin  (LIPITOR ) 80 MG tablet Take 1 tablet (80 mg total) by mouth daily. 90 tablet 0   Blood Glucose Monitoring Suppl (ACCU-CHEK GUIDE ME) w/Device KIT 1 Piece by Does not apply route as directed. 1 kit 0   clopidogrel  (PLAVIX ) 75 MG tablet Take 1 tablet (75 mg total) by mouth daily with breakfast. 30 tablet 1   colchicine 0.6 MG tablet Take 0.6 mg by mouth.     Continuous Blood Gluc Receiver (FREESTYLE LIBRE 2 READER) DEVI As directed 1 each 0   Continuous Glucose Sensor (FREESTYLE LIBRE 2 SENSOR) MISC APPLY NEW SENSOR EVERY 14 (FOURTEEN) DAYS. 2 each 2   cyanocobalamin 1000 MCG tablet Take 1,000 mcg by mouth daily.     diphenhydrAMINE  (BENADRYL ) 25 MG tablet Take 25 mg by mouth daily as needed for allergies.     donepezil  (ARICEPT ) 5 MG tablet Take 5 mg by mouth at bedtime. (Patient not taking: Reported on 01/15/2024)     doxycycline (VIBRA-TABS) 100 MG tablet Take 1 tablet (100 mg total) by mouth 2 (two) times daily. 60 tablet 1   fluticasone  (FLONASE ) 50 MCG/ACT nasal spray Place 2 sprays daily as needed into both nostrils for allergies or  rhinitis.     gabapentin  (NEURONTIN ) 600 MG tablet Take 600 mg by mouth 3 (three) times daily.     Insulin  NPH Isophane & Regular (NOVOLIN  70/30 FLEXPEN RELION [place]) Inject 5 Units into the skin 2 (two) times daily with a meal.     JARDIANCE  10 MG TABS tablet TAKE 1 TABLET BY MOUTH EVERY DAY 90 tablet 1   metoprolol  tartrate (LOPRESSOR ) 25 MG tablet Take 25 mg by mouth 2 (two) times daily.     oxyCODONE (ROXICODONE) 5 MG immediate release tablet Take 1 tablet (5 mg total) by mouth every 4  (four) hours as needed for severe pain (pain score 7-10). 30 tablet 0   pantoprazole  (PROTONIX ) 40 MG tablet Take 1 tablet (40 mg total) by mouth daily. 30 tablet 2   predniSONE (DELTASONE) 10 MG tablet Take by mouth.     XARELTO  20 MG TABS tablet Take 20 mg by mouth daily.     No current facility-administered medications for this visit.    Allergies as of 01/18/2024 - Review Complete 01/17/2024  Allergen Reaction Noted   Ace inhibitors Swelling 03/18/2013   Allopurinol  Itching 05/11/2020   Metformin  and related Itching 06/30/2013    Past Medical History:  Diagnosis Date   Asthma 04/11/2011   AS CHILD ONLY   BPH (benign prostatic hypertrophy) with urinary obstruction    Carpal tunnel syndrome    patient denies at 05/14/17 preop visit    Difficult intubation 04/11/2011   08-11-97 some issues with intubation/record with chart   DJD (degenerative joint disease)    DM (diabetes mellitus) (HCC) 04/11/2011   tyep II    DVT femoral (deep venous thrombosis) with thrombophlebitis (HCC) 04/11/2011   ?'09-tx. on Xarelton x 3-4 years    GERD (gastroesophageal reflux disease) 04/11/2011   mild, no med in 1 month   Gout 04/11/2011    ankle 2 yrs ago   Hemorrhoids    Hypercholesteremia    Hypertension    Internal hemorrhoids    Keloid 04/11/2011   multiple-arms,back, chest   Pulmonary nodule 04/11/2011   PT. NOT AWARE -denies problems with breathing   Renal calculus    Thyroid  disease    hypothyroid    Past Surgical History:  Procedure Laterality Date   ABDOMINAL AORTOGRAM W/LOWER EXTREMITY N/A 12/05/2023   Procedure: ABDOMINAL AORTOGRAM W/LOWER EXTREMITY;  Surgeon: Lanis Fonda BRAVO, MD;  Location: [facility] INVASIVE CV LAB;  Service: Cardiovascular;  Laterality: N/A;   AMPUTATION TOE Right 12/26/2023   Procedure: AMPUTATION, TOE;  Surgeon: Harden Jerona GAILS, MD;  Location: [facility] OR;  Service: Orthopedics;  Laterality: Right;  RIGHT 2ND TOE AMPUTATION   CATARACT EXTRACTION W/PHACO Left  10/28/2021   Procedure: CATARACT EXTRACTION PHACO AND INTRAOCULAR LENS PLACEMENT (IOC);  Surgeon: Harrie Agent, MD;  Location: [facility];  Service: Ophthalmology;  Laterality: Left;  CDE: 6.61   cataract surgery Right 12/11/2013   COLONOSCOPY WITH PROPOFOL  N/A 02/05/2017   Procedure: COLONOSCOPY WITH PROPOFOL ;  Surgeon: Aneita Gwendlyn DASEN, MD;  Location: [facility];  Service: Endoscopy;  Laterality: N/A;   COSMETIC SURGERY  03/13/1998   Keloid injections   CYSTOSCOPY WITH BIOPSY  04/14/2011   Procedure: CYSTOSCOPY WITH BIOPSY;  Surgeon: Noretta Ferrara, MD;  Location: [facility];  Service: Urology;  Laterality: N/A;  Bladder Biopsies    CYSTOSCOPY/RETROGRADE/URETEROSCOPY  04/14/2011   Procedure: CYSTOSCOPY/RETROGRADE/URETEROSCOPY;  Surgeon: Noretta Ferrara, MD;  Location: [facility];  Service: Urology;  Laterality: Bilateral;  (Bil) RPG    LAPAROSCOPIC NEPHRECTOMY  Right 05/21/2017   Procedure: LAPAROSCOPIC RADICAL NEPHRECTOMY;  Surgeon: Renda Glance, MD;  Location: [facility];  Service: Urology;  Laterality: Right;   LOWER EXTREMITY INTERVENTION  12/05/2023   Procedure: LOWER EXTREMITY INTERVENTION;  Surgeon: Lanis Fonda BRAVO, MD;  Location: [facility] INVASIVE CV LAB;  Service: Cardiovascular;;   ortho surgery on fingers  2005 & 2008   Dr. Jearld finger release   PROSTATECTOMY  03/13/1997   Dr. Nicholaus    Review of Systems:    All systems reviewed and negative except where noted in HPI.    Physical Exam:  There were no vitals taken for this visit. No LMP for male patient.  General: Well-nourished, well-developed in no acute distress.  Lungs: Clear to auscultation bilaterally. Non-labored. Heart: Regular rate and rhythm, no murmurs rubs or gallops.  Abdomen: Bowel sounds are normal; Abdomen is Soft; No hepatosplenomegaly, masses or hernias;  No Abdominal Tenderness; No guarding or rebound tenderness. Neuro: Alert and oriented x 3.  Grossly intact.  Psych: Alert and cooperative, normal mood and  affect.   Imaging Studies: No results found.  Labs: CBC    Component Value Date/Time   WBC 9.8 12/24/2023 0917   RBC 5.08 12/24/2023 0917   HGB 15.3 12/24/2023 0917   HCT 46.5 12/24/2023 0917   PLT 184 12/24/2023 0917   MCV 91.5 12/24/2023 0917   MCH 30.1 12/24/2023 0917   MCHC 32.9 12/24/2023 0917   RDW 12.9 12/24/2023 0917   LYMPHSABS 1.2 12/01/2023 2225   MONOABS 0.6 12/01/2023 2225   EOSABS 176 12/24/2023 0917   BASOSABS 39 12/24/2023 0917    CMP     Component Value Date/Time   NA 141 12/24/2023 0917   NA 140 12/18/2023 0904   K 3.6 12/24/2023 0917   CL 104 12/24/2023 0917   CO2 29 12/24/2023 0917   GLUCOSE 198 (H) 12/24/2023 0917   BUN 26 (H) 12/24/2023 0917   BUN 30 (H) 12/18/2023 0904   CREATININE 2.15 (H) 12/24/2023 0917   CALCIUM  9.7 12/24/2023 0917   PROT 7.2 12/18/2023 0904   ALBUMIN 4.4 12/18/2023 0904   AST 16 12/18/2023 0904   ALT 14 12/18/2023 0904   ALKPHOS 95 12/18/2023 0904   BILITOT 0.7 12/18/2023 0904   GFRNONAA 32 (L) 12/06/2023 0258   GFRNONAA 33 (L) 02/26/2019 1510   GFRAA 35 (L) 10/29/2019 1502   GFRAA 38 (L) 02/26/2019 1510       Assessment and Plan:   Hector Rose is a 83 y.o. y/o male ***  Assessment and Plan Assessment & Plan       Ellouise Console, PA-C  Follow up ***

## 2024-01-17 NOTE — Progress Notes (Signed)
 Office Visit Note   Patient: Hector Rose           Date of Birth: Jul 14, 1940           MRN: [id number] Visit Date: 01/17/2024              Requested by: Marvine Rush, MD [address] [place],  [place] [address] PCP: Marvine Rush, MD  Chief Complaint  Patient presents with   Right Foot - Routine Post Op    12/26/2023 right 2nd toe amputation         HPI: 83 y/o male with history of Osteomyelitis Right 2nd Toe s/p second toe amputation on 12/26/23.  He denies much pain, no fever or chills. He was placed on oral Doxycycline BID at discharge.   Plan after post op visit continue vashe dressing change; may shower prn with soap; limited amulbation; f/u 2 weeks.  He had a follow up visit with ID and they have stopped the oral antibiotics.    His wife has been performing daily dressing changes with Vashe.  Elevation and limited ambulation to ADL's to decrease edema to promote healing.    Assessment & Plan: Visit Diagnoses: No diagnosis found.  Plan: Dry dressing as needed, clean skin with Vashe.  Elevation and increase activity in the hard sole shoe as needed. Wean into a comfortable shoe in the next few weeks.    Follow-Up Instructions: Return in about 2 weeks (around 01/31/2024).   Ortho Exam  Patient is alert, oriented, no adenopathy, well-dressed, normal affect, normal respiratory effort. Biphasic PT, monophasic DP signals The incision appears well healed.  No cellulitis minimal edema.  No dehiscence of the incision.  Incision well healed.  No drainage or cellulitis.    Imaging: No results found. No images are attached to the encounter.  Labs: Lab Results  Component Value Date   HGBA1C 7.2 (H) 12/01/2023   HGBA1C 7.8 (A) 08/24/2023   HGBA1C 8.1 (A) 12/21/2022   ESRSEDRATE 29 (H) 12/24/2023   ESRSEDRATE 67 (H) 12/01/2023   CRP 10.3 (H) 12/24/2023   CRP 2.0 (H) 12/01/2023   LABURIC 6.5 03/25/2008   LABURIC 6.1 09/26/2007   LABURIC 4.9 06/05/2007   REPTSTATUS  12/07/2023 FINAL 12/03/2023   GRAMSTAIN  12/03/2023    NO WBC SEEN NO ORGANISMS SEEN Performed at [facility], [address], [place], [place] [address]    CULT FEW STAPHYLOCOCCUS EPIDERMIDIS 12/03/2023   [facility] STAPHYLOCOCCUS EPIDERMIDIS 12/03/2023     Lab Results  Component Value Date   ALBUMIN 4.4 12/18/2023   ALBUMIN 3.1 (L) 12/02/2023   ALBUMIN 4.1 04/17/2023    Lab Results  Component Value Date   MG 2.3 12/02/2023   Lab Results  Component Value Date   VD25OH 30.7 10/29/2019   VD25OH 12 (L) 03/19/2017   VD25OH 27 (L) 03/04/2015    No results found for: PREALBUMIN    Latest Ref Rng & Units 12/24/2023    9:17 AM 12/06/2023    2:58 AM 12/05/2023    5:31 AM  CBC EXTENDED  WBC 3.8 - 10.8 Thousand/uL 9.8  9.2  7.5   RBC 4.20 - 5.80 Million/uL 5.08  4.31  4.85   Hemoglobin 13.2 - 17.1 g/dL 84.6  86.8  85.2   HCT 38.5 - 50.0 % 46.5  38.5  42.8   Platelets 140 - 400 Thousand/uL 184  176  206   NEUT# 1,500 - 7,800 cells/uL 8,212  There is no height or weight on file to calculate BMI.  Orders:  No orders of the defined types were placed in this encounter.  No orders of the defined types were placed in this encounter.    Procedures: No procedures performed  Clinical Data: No additional findings.  ROS:  All other systems negative, except as noted in the HPI. Review of Systems  Objective: Vital Signs: There were no vitals taken for this visit.  Specialty Comments:  No specialty comments available.  PMFS History: Patient Active Problem List   Diagnosis Date Noted   Cellulitis 12/06/2023   Osteomyelitis of second toe of right foot (HCC) 12/02/2023   Type 2 diabetes mellitus with hyperglycemia (HCC) 12/02/2023   CKD (chronic kidney disease), stage IV (HCC) 12/02/2023   Swelling of right lower extremity 12/02/2023   Insulin  long-term use (HCC) 08/24/2023   Multinodular goiter 04/29/2020   S/p nephrectomy 02/04/2018   Neoplasm of right  kidney 05/21/2017   Vitamin D  deficiency 03/28/2017   Special screening for malignant neoplasms, colon    Benign neoplasm of transverse colon    History of DVT (deep vein thrombosis) 07/20/2016   Renal lesion 07/20/2016   Type 2 diabetes mellitus with stage 3a chronic kidney disease, with long-term current use of insulin  (HCC) 01/15/2014   Chronic idiopathic gout of multiple sites 01/15/2014   Calcified pleural plaque on chest x-ray 06/30/2013   Tinea pedis 09/28/2011   Hematuria 05/19/2011   HEMORRHOIDS 05/29/2007   Pulmonary nodule 03/27/2007   GERD (gastroesophageal reflux disease) 03/27/2007   Benign prostatic hyperplasia with urinary obstruction 03/27/2007   KELOID 03/27/2007   Mixed hyperlipidemia 01/24/2007   CARPAL TUNNEL SYNDROME 01/24/2007   Essential hypertension 01/24/2007   RENAL CALCULUS 01/24/2007   Osteoarthritis 01/24/2007   Past Medical History:  Diagnosis Date   Asthma 04/11/2011   AS CHILD ONLY   BPH (benign prostatic hypertrophy) with urinary obstruction    Carpal tunnel syndrome    patient denies at 05/14/17 preop visit    Difficult intubation 04/11/2011   08-11-97 some issues with intubation/record with chart   DJD (degenerative joint disease)    DM (diabetes mellitus) (HCC) 04/11/2011   tyep II    DVT femoral (deep venous thrombosis) with thrombophlebitis (HCC) 04/11/2011   ?'09-tx. on Xarelton x 3-4 years    GERD (gastroesophageal reflux disease) 04/11/2011   mild, no med in 1 month   Gout 04/11/2011    ankle 2 yrs ago   Hemorrhoids    Hypercholesteremia    Hypertension    Internal hemorrhoids    Keloid 04/11/2011   multiple-arms,back, chest   Pulmonary nodule 04/11/2011   PT. NOT AWARE -denies problems with breathing   Renal calculus    Thyroid  disease    hypothyroid    History reviewed. No pertinent family history.  Past Surgical History:  Procedure Laterality Date   ABDOMINAL AORTOGRAM W/LOWER EXTREMITY N/A 12/05/2023   Procedure:  ABDOMINAL AORTOGRAM W/LOWER EXTREMITY;  Surgeon: Lanis Fonda BRAVO, MD;  Location: [facility] INVASIVE CV LAB;  Service: Cardiovascular;  Laterality: N/A;   AMPUTATION TOE Right 12/26/2023   Procedure: AMPUTATION, TOE;  Surgeon: Harden Jerona GAILS, MD;  Location: [facility] OR;  Service: Orthopedics;  Laterality: Right;  RIGHT 2ND TOE AMPUTATION   CATARACT EXTRACTION W/PHACO Left 10/28/2021   Procedure: CATARACT EXTRACTION PHACO AND INTRAOCULAR LENS PLACEMENT (IOC);  Surgeon: Harrie Agent, MD;  Location: [facility];  Service: Ophthalmology;  Laterality: Left;  CDE: 6.61   cataract surgery  Right 12/11/2013   COLONOSCOPY WITH PROPOFOL  N/A 02/05/2017   Procedure: COLONOSCOPY WITH PROPOFOL ;  Surgeon: Aneita Gwendlyn DASEN, MD;  Location: [facility];  Service: Endoscopy;  Laterality: N/A;   COSMETIC SURGERY  03/13/1998   Keloid injections   CYSTOSCOPY WITH BIOPSY  04/14/2011   Procedure: CYSTOSCOPY WITH BIOPSY;  Surgeon: Noretta Ferrara, MD;  Location: [facility];  Service: Urology;  Laterality: N/A;  Bladder Biopsies    CYSTOSCOPY/RETROGRADE/URETEROSCOPY  04/14/2011   Procedure: CYSTOSCOPY/RETROGRADE/URETEROSCOPY;  Surgeon: Noretta Ferrara, MD;  Location: [facility];  Service: Urology;  Laterality: Bilateral;  (Bil) RPG    LAPAROSCOPIC NEPHRECTOMY Right 05/21/2017   Procedure: LAPAROSCOPIC RADICAL NEPHRECTOMY;  Surgeon: Ferrara Glance, MD;  Location: [facility];  Service: Urology;  Laterality: Right;   LOWER EXTREMITY INTERVENTION  12/05/2023   Procedure: LOWER EXTREMITY INTERVENTION;  Surgeon: Lanis Fonda BRAVO, MD;  Location: [facility] INVASIVE CV LAB;  Service: Cardiovascular;;   ortho surgery on fingers  2005 & 2008   Dr. Jearld finger release   PROSTATECTOMY  03/13/1997   Dr. Nicholaus   Social History   Occupational History   Occupation: retired from [employer]  Tobacco Use   Smoking status: Never   Smokeless tobacco: Never  Vaping Use   Vaping status: Never Used  Substance and Sexual Activity   Alcohol use: No    Alcohol/week:  0.0 standard drinks of alcohol   Drug use: No   Sexual activity: Yes

## 2024-01-18 ENCOUNTER — Ambulatory Visit: Admitting: Physician Assistant

## 2024-01-24 DIAGNOSIS — L603 Nail dystrophy: Secondary | ICD-10-CM | POA: Diagnosis not present

## 2024-02-05 ENCOUNTER — Telehealth: Payer: Self-pay | Admitting: Physician Assistant

## 2024-02-05 ENCOUNTER — Encounter: Payer: Self-pay | Admitting: Physician Assistant

## 2024-02-05 ENCOUNTER — Ambulatory Visit: Admitting: Physician Assistant

## 2024-02-05 DIAGNOSIS — Z9862 Peripheral vascular angioplasty status: Secondary | ICD-10-CM

## 2024-02-05 DIAGNOSIS — K219 Gastro-esophageal reflux disease without esophagitis: Secondary | ICD-10-CM

## 2024-02-05 MED ORDER — OMEPRAZOLE 40 MG PO CPDR: 40.0000 mg | DELAYED_RELEASE_CAPSULE | Freq: Every day | ORAL | 2 refills | Status: DC

## 2024-02-05 MED ORDER — CLOPIDOGREL BISULFATE 75 MG PO TABS: 75.0000 mg | ORAL_TABLET | Freq: Every day | ORAL | 1 refills | Status: DC

## 2024-02-05 NOTE — Telephone Encounter (Signed)
 error

## 2024-02-05 NOTE — Progress Notes (Signed)
 Office Visit Note   Patient: Hector Rose           Date of Birth: 09/04/1940           MRN: [id number] Visit Date: 02/05/2024              Requested by: Marvine Rush, MD [address] [place],  [place] [address] PCP: Marvine Rush, MD  Chief Complaint  Patient presents with   Right Foot - Routine Post Op    12/26/2023 right 2nd toe amputation      HPI: 83 y/o male with history of Osteomyelitis Right 2nd Toe s/p second toe amputation on 12/26/23. He is WBAT in a post op shoe.    Assessment & Plan: Visit Diagnoses:  1. Gastroesophageal reflux disease without esophagitis   2. H/O angioplasty     Plan: High toe box stiff walking shoe was recommended.He will follow PRN for nail trim and foot care.  Check foot skin daily.  I did refill his Plavix  and Protonix  today and would like it if his PCP will follow these medications.    Follow-Up Instructions: Return if symptoms worsen or fail to improve, for Nail trim.   Ortho Exam  Patient is alert, oriented, no adenopathy, well-dressed, normal affect, normal respiratory effort. Well healed right second toe amputation.  No cellulitis or edema.  Foot is warm and well perfused.     Imaging: No results found. No images are attached to the encounter.  Labs: Lab Results  Component Value Date   HGBA1C 7.2 (H) 12/01/2023   HGBA1C 7.8 (A) 08/24/2023   HGBA1C 8.1 (A) 12/21/2022   ESRSEDRATE 29 (H) 12/24/2023   ESRSEDRATE 67 (H) 12/01/2023   CRP 10.3 (H) 12/24/2023   CRP 2.0 (H) 12/01/2023   LABURIC 6.5 03/25/2008   LABURIC 6.1 09/26/2007   LABURIC 4.9 06/05/2007   REPTSTATUS 12/07/2023 FINAL 12/03/2023   GRAMSTAIN  12/03/2023    NO WBC SEEN NO ORGANISMS SEEN Performed at [facility], [address] [place], [place] [address]    CULT FEW STAPHYLOCOCCUS EPIDERMIDIS 12/03/2023   [facility] STAPHYLOCOCCUS EPIDERMIDIS 12/03/2023     Lab Results  Component Value Date   ALBUMIN 4.4 12/18/2023   ALBUMIN 3.1 (L) 12/02/2023    ALBUMIN 4.1 04/17/2023    Lab Results  Component Value Date   MG 2.3 12/02/2023   Lab Results  Component Value Date   VD25OH 30.7 10/29/2019   VD25OH 12 (L) 03/19/2017   VD25OH 27 (L) 03/04/2015    No results found for: PREALBUMIN    Latest Ref Rng & Units 12/24/2023    9:17 AM 12/06/2023    2:58 AM 12/05/2023    5:31 AM  CBC EXTENDED  WBC 3.8 - 10.8 Thousand/uL 9.8  9.2  7.5   RBC 4.20 - 5.80 Million/uL 5.08  4.31  4.85   Hemoglobin 13.2 - 17.1 g/dL 84.6  86.8  85.2   HCT 38.5 - 50.0 % 46.5  38.5  42.8   Platelets 140 - 400 Thousand/uL 184  176  206   NEUT# 1,500 - 7,800 cells/uL 8,212        There is no height or weight on file to calculate BMI.  Orders:  No orders of the defined types were placed in this encounter.  Meds ordered this encounter  Medications   clopidogrel  (PLAVIX ) 75 MG tablet    Sig: Take 1 tablet (75 mg total) by mouth daily.  Dispense:  30 tablet    Refill:  1    Supervising Provider:   DUDA, MARCUS V [[id number]]   omeprazole  (PRILOSEC) 40 MG capsule    Sig: Take 1 capsule (40 mg total) by mouth daily.    Dispense:  30 capsule    Refill:  2    Supervising Provider:   DUDA, MARCUS V [[id number]]     Procedures: No procedures performed  Clinical Data: No additional findings.  ROS:  All other systems negative, except as noted in the HPI. Review of Systems  Objective: Vital Signs: There were no vitals taken for this visit.  Specialty Comments:  No specialty comments available.  PMFS History: Patient Active Problem List   Diagnosis Date Noted   Cellulitis 12/06/2023   Osteomyelitis of second toe of right foot (HCC) 12/02/2023   Type 2 diabetes mellitus with hyperglycemia (HCC) 12/02/2023   CKD (chronic kidney disease), stage IV (HCC) 12/02/2023   Swelling of right lower extremity 12/02/2023   Insulin  long-term use (HCC) 08/24/2023   Multinodular goiter 04/29/2020   S/p nephrectomy 02/04/2018   Neoplasm of right kidney 05/21/2017    Vitamin D  deficiency 03/28/2017   Special screening for malignant neoplasms, colon    Benign neoplasm of transverse colon    History of DVT (deep vein thrombosis) 07/20/2016   Renal lesion 07/20/2016   Type 2 diabetes mellitus with stage 3a chronic kidney disease, with long-term current use of insulin  (HCC) 01/15/2014   Chronic idiopathic gout of multiple sites 01/15/2014   Calcified pleural plaque on chest x-ray 06/30/2013   Tinea pedis 09/28/2011   Hematuria 05/19/2011   HEMORRHOIDS 05/29/2007   Pulmonary nodule 03/27/2007   GERD (gastroesophageal reflux disease) 03/27/2007   Benign prostatic hyperplasia with urinary obstruction 03/27/2007   KELOID 03/27/2007   Mixed hyperlipidemia 01/24/2007   CARPAL TUNNEL SYNDROME 01/24/2007   Essential hypertension 01/24/2007   RENAL CALCULUS 01/24/2007   Osteoarthritis 01/24/2007   Past Medical History:  Diagnosis Date   Asthma 04/11/2011   AS CHILD ONLY   BPH (benign prostatic hypertrophy) with urinary obstruction    Carpal tunnel syndrome    patient denies at 05/14/17 preop visit    Difficult intubation 04/11/2011   08-11-97 some issues with intubation/record with chart   DJD (degenerative joint disease)    DM (diabetes mellitus) (HCC) 04/11/2011   tyep II    DVT femoral (deep venous thrombosis) with thrombophlebitis (HCC) 04/11/2011   ?'09-tx. on Xarelton x 3-4 years    GERD (gastroesophageal reflux disease) 04/11/2011   mild, no med in 1 month   Gout 04/11/2011    ankle 2 yrs ago   Hemorrhoids    Hypercholesteremia    Hypertension    Internal hemorrhoids    Keloid 04/11/2011   multiple-arms,back, chest   Pulmonary nodule 04/11/2011   PT. NOT AWARE -denies problems with breathing   Renal calculus    Thyroid  disease    hypothyroid    History reviewed. No pertinent family history.  Past Surgical History:  Procedure Laterality Date   ABDOMINAL AORTOGRAM W/LOWER EXTREMITY N/A 12/05/2023   Procedure: ABDOMINAL AORTOGRAM  W/LOWER EXTREMITY;  Surgeon: Lanis Fonda BRAVO, MD;  Location: [facility] INVASIVE CV LAB;  Service: Cardiovascular;  Laterality: N/A;   AMPUTATION TOE Right 12/26/2023   Procedure: AMPUTATION, TOE;  Surgeon: Harden Jerona GAILS, MD;  Location: [facility] OR;  Service: Orthopedics;  Laterality: Right;  RIGHT 2ND TOE AMPUTATION   CATARACT EXTRACTION W/PHACO Left 10/28/2021   Procedure: CATARACT  EXTRACTION PHACO AND INTRAOCULAR LENS PLACEMENT (IOC);  Surgeon: Harrie Agent, MD;  Location: [facility];  Service: Ophthalmology;  Laterality: Left;  CDE: 6.61   cataract surgery Right 12/11/2013   COLONOSCOPY WITH PROPOFOL  N/A 02/05/2017   Procedure: COLONOSCOPY WITH PROPOFOL ;  Surgeon: Aneita Gwendlyn DASEN, MD;  Location: [facility];  Service: Endoscopy;  Laterality: N/A;   COSMETIC SURGERY  03/13/1998   Keloid injections   CYSTOSCOPY WITH BIOPSY  04/14/2011   Procedure: CYSTOSCOPY WITH BIOPSY;  Surgeon: Noretta Ferrara, MD;  Location: [facility];  Service: Urology;  Laterality: N/A;  Bladder Biopsies    CYSTOSCOPY/RETROGRADE/URETEROSCOPY  04/14/2011   Procedure: CYSTOSCOPY/RETROGRADE/URETEROSCOPY;  Surgeon: Noretta Ferrara, MD;  Location: [facility];  Service: Urology;  Laterality: Bilateral;  (Bil) RPG    LAPAROSCOPIC NEPHRECTOMY Right 05/21/2017   Procedure: LAPAROSCOPIC RADICAL NEPHRECTOMY;  Surgeon: Ferrara Glance, MD;  Location: [facility];  Service: Urology;  Laterality: Right;   LOWER EXTREMITY INTERVENTION  12/05/2023   Procedure: LOWER EXTREMITY INTERVENTION;  Surgeon: Lanis Fonda BRAVO, MD;  Location: [facility] INVASIVE CV LAB;  Service: Cardiovascular;;   ortho surgery on fingers  2005 & 2008   Dr. Jearld finger release   PROSTATECTOMY  03/13/1997   Dr. Nicholaus   Social History   Occupational History   Occupation: retired from [employer]  Tobacco Use   Smoking status: Never   Smokeless tobacco: Never  Vaping Use   Vaping status: Never Used  Substance and Sexual Activity   Alcohol use: No    Alcohol/week: 0.0 standard drinks of  alcohol   Drug use: No   Sexual activity: Yes

## 2024-02-25 ENCOUNTER — Encounter: Payer: Self-pay | Admitting: Physician Assistant

## 2024-02-25 ENCOUNTER — Ambulatory Visit: Admitting: Physician Assistant

## 2024-02-25 DIAGNOSIS — Z89421 Acquired absence of other right toe(s): Secondary | ICD-10-CM

## 2024-02-25 NOTE — Progress Notes (Signed)
 Office Visit Note   Patient: Hector Rose           Date of Birth: [birth year]           MRN: [id number] Visit Date: 02/25/2024              Requested by: Marvine Rush, MD [address] [place],  [place] [address] PCP: Marvine Rush, MD  Chief Complaint  Patient presents with   Right Leg - Routine Post Op    12/26/2023 right 2nd toe amputation      HPI: 83 y/o male with history of Osteomyelitis Right 2nd Toe s/p second toe amputation on 12/26/23. His wife notoiced her has a red spot on the left medial foot at the fist MTP area and wanted us  to examine him.  She has bought new shoes for him with a wide toe box.    Assessment & Plan: Visit Diagnoses:  1. History of amputation of right second toe     Plan: The new shoe wear her has has a wide toe box and appears wide enough to accommodate his foot size.  Continue to check the skin of B feet daily.  He may go back to the gym daily for exercise to include stationary bike riding and UE strengthening.  If they have concerns they will call our office.  Elevation PRN for edema in the right foot.  He may have swelling with healing for 6 months to a year.    Follow-Up Instructions: Return if symptoms worsen or fail to improve.   Ortho Exam  Patient is alert, oriented, no adenopathy, well-dressed, normal affect, normal respiratory effort. Palpable DP pulse, good skin lines.  Edema in the right foot > left after second toe amputation.  The amputation site on the right foot is well healed.  There is no open wound or cellulitis in the left foot.  Cap refill is brisk.      Imaging: No results found. No images are attached to the encounter.  Labs: Lab Results  Component Value Date   HGBA1C 7.2 (H) 12/01/2023   HGBA1C 7.8 (A) 08/24/2023   HGBA1C 8.1 (A) 12/21/2022   ESRSEDRATE 29 (H) 12/24/2023   ESRSEDRATE 67 (H) 12/01/2023   CRP 10.3 (H) 12/24/2023   CRP 2.0 (H) 12/01/2023   LABURIC 6.5 03/25/2008   LABURIC 6.1 09/26/2007    LABURIC 4.9 06/05/2007   REPTSTATUS 12/07/2023 FINAL 12/03/2023   GRAMSTAIN  12/03/2023    NO WBC SEEN NO ORGANISMS SEEN Performed at [facility], [address], [place], [place] [address]    CULT FEW STAPHYLOCOCCUS EPIDERMIDIS 12/03/2023   LABORGA STAPHYLOCOCCUS EPIDERMIDIS 12/03/2023     Lab Results  Component Value Date   ALBUMIN 4.4 12/18/2023   ALBUMIN 3.1 (L) 12/02/2023   ALBUMIN 4.1 04/17/2023    Lab Results  Component Value Date   MG 2.3 12/02/2023   Lab Results  Component Value Date   VD25OH 30.7 10/29/2019   VD25OH 12 (L) 03/19/2017   VD25OH 27 (L) 03/04/2015    No results found for: PREALBUMIN    Latest Ref Rng & Units 12/24/2023    9:17 AM 12/06/2023    2:58 AM 12/05/2023    5:31 AM  CBC EXTENDED  WBC 3.8 - 10.8 Thousand/uL 9.8  9.2  7.5   RBC 4.20 - 5.80 Million/uL 5.08  4.31  4.85   Hemoglobin 13.2 - 17.1 g/dL 84.6  86.8  85.2   HCT  38.5 - 50.0 % 46.5  38.5  42.8   Platelets 140 - 400 Thousand/uL 184  176  206   NEUT# 1,500 - 7,800 cells/uL 8,212        There is no height or weight on file to calculate BMI.  Orders:  No orders of the defined types were placed in this encounter.  No orders of the defined types were placed in this encounter.    Procedures: No procedures performed  Clinical Data: No additional findings.  ROS:  All other systems negative, except as noted in the HPI. Review of Systems  Objective: Vital Signs: There were no vitals taken for this visit.  Specialty Comments:  No specialty comments available.  PMFS History: Patient Active Problem List   Diagnosis Date Noted   Cellulitis 12/06/2023   Osteomyelitis of second toe of right foot (HCC) 12/02/2023   Type 2 diabetes mellitus with hyperglycemia (HCC) 12/02/2023   CKD (chronic kidney disease), stage IV (HCC) 12/02/2023   Swelling of right lower extremity 12/02/2023   Insulin  long-term use (HCC) 08/24/2023   Multinodular goiter 04/29/2020   S/p  nephrectomy 02/04/2018   Neoplasm of right kidney 05/21/2017   Vitamin D  deficiency 03/28/2017   Special screening for malignant neoplasms, colon    Benign neoplasm of transverse colon    History of DVT (deep vein thrombosis) 07/20/2016   Renal lesion 07/20/2016   Type 2 diabetes mellitus with stage 3a chronic kidney disease, with long-term current use of insulin  (HCC) 01/15/2014   Chronic idiopathic gout of multiple sites 01/15/2014   Calcified pleural plaque on chest x-ray 06/30/2013   Tinea pedis 09/28/2011   Hematuria 05/19/2011   HEMORRHOIDS 05/29/2007   Pulmonary nodule 03/27/2007   GERD (gastroesophageal reflux disease) 03/27/2007   Benign prostatic hyperplasia with urinary obstruction 03/27/2007   KELOID 03/27/2007   Mixed hyperlipidemia 01/24/2007   CARPAL TUNNEL SYNDROME 01/24/2007   Essential hypertension 01/24/2007   RENAL CALCULUS 01/24/2007   Osteoarthritis 01/24/2007   Past Medical History:  Diagnosis Date   Asthma 04/11/2011   AS CHILD ONLY   BPH (benign prostatic hypertrophy) with urinary obstruction    Carpal tunnel syndrome    patient denies at 05/14/17 preop visit    Difficult intubation 04/11/2011   08-11-97 some issues with intubation/record with chart   DJD (degenerative joint disease)    DM (diabetes mellitus) (HCC) 04/11/2011   tyep II    DVT femoral (deep venous thrombosis) with thrombophlebitis (HCC) 04/11/2011   ?'09-tx. on Xarelton x 3-4 years    GERD (gastroesophageal reflux disease) 04/11/2011   mild, no med in 1 month   Gout 04/11/2011    ankle 2 yrs ago   Hemorrhoids    Hypercholesteremia    Hypertension    Internal hemorrhoids    Keloid 04/11/2011   multiple-arms,back, chest   Pulmonary nodule 04/11/2011   PT. NOT AWARE -denies problems with breathing   Renal calculus    Thyroid  disease    hypothyroid    History reviewed. No pertinent family history.  Past Surgical History:  Procedure Laterality Date   ABDOMINAL AORTOGRAM W/LOWER  EXTREMITY N/A 12/05/2023   Procedure: ABDOMINAL AORTOGRAM W/LOWER EXTREMITY;  Surgeon: Lanis Fonda BRAVO, MD;  Location: [facility] INVASIVE CV LAB;  Service: Cardiovascular;  Laterality: N/A;   AMPUTATION TOE Right 12/26/2023   Procedure: AMPUTATION, TOE;  Surgeon: Harden Jerona GAILS, MD;  Location: [facility] OR;  Service: Orthopedics;  Laterality: Right;  RIGHT 2ND TOE AMPUTATION   CATARACT EXTRACTION  W/PHACO Left 10/28/2021   Procedure: CATARACT EXTRACTION PHACO AND INTRAOCULAR LENS PLACEMENT (IOC);  Surgeon: Harrie Agent, MD;  Location: [facility];  Service: Ophthalmology;  Laterality: Left;  CDE: 6.61   cataract surgery Right 12/11/2013   COLONOSCOPY WITH PROPOFOL  N/A 02/05/2017   Procedure: COLONOSCOPY WITH PROPOFOL ;  Surgeon: Aneita Gwendlyn DASEN, MD;  Location: [facility];  Service: Endoscopy;  Laterality: N/A;   COSMETIC SURGERY  03/13/1998   Keloid injections   CYSTOSCOPY WITH BIOPSY  04/14/2011   Procedure: CYSTOSCOPY WITH BIOPSY;  Surgeon: Noretta Ferrara, MD;  Location: [facility];  Service: Urology;  Laterality: N/A;  Bladder Biopsies    CYSTOSCOPY/RETROGRADE/URETEROSCOPY  04/14/2011   Procedure: CYSTOSCOPY/RETROGRADE/URETEROSCOPY;  Surgeon: Noretta Ferrara, MD;  Location: [facility];  Service: Urology;  Laterality: Bilateral;  (Bil) RPG    LAPAROSCOPIC NEPHRECTOMY Right 05/21/2017   Procedure: LAPAROSCOPIC RADICAL NEPHRECTOMY;  Surgeon: Ferrara Glance, MD;  Location: [facility];  Service: Urology;  Laterality: Right;   LOWER EXTREMITY INTERVENTION  12/05/2023   Procedure: LOWER EXTREMITY INTERVENTION;  Surgeon: Lanis Fonda BRAVO, MD;  Location: [facility] INVASIVE CV LAB;  Service: Cardiovascular;;   ortho surgery on fingers  2005 & 2008   Dr. Jearld finger release   PROSTATECTOMY  03/13/1997   Dr. Nicholaus   Social History   Occupational History   Occupation: retired from [employer]  Tobacco Use   Smoking status: Never   Smokeless tobacco: Never  Vaping Use   Vaping status: Never Used  Substance and Sexual Activity    Alcohol use: No    Alcohol/week: 0.0 standard drinks of alcohol   Drug use: No   Sexual activity: Yes

## 2024-03-04 ENCOUNTER — Other Ambulatory Visit: Payer: Self-pay | Admitting: "Endocrinology

## 2024-03-04 DIAGNOSIS — Z794 Long term (current) use of insulin: Secondary | ICD-10-CM

## 2024-03-10 ENCOUNTER — Ambulatory Visit: Admitting: Physician Assistant

## 2024-03-21 NOTE — Progress Notes (Signed)
 Hector Rose                                          MRN: [id number]   03/21/2024   The [facility] Quality Team Specialist reviewed this patient medical record for the purposes of chart review for care gap closure. The following were reviewed: chart review for care gap closure-kidney health evaluation for diabetes:eGFR  and uACR.    [facility]BCI Quality Team

## 2024-03-24 ENCOUNTER — Ambulatory Visit: Admitting: Physician Assistant

## 2024-03-28 ENCOUNTER — Other Ambulatory Visit: Payer: Self-pay | Admitting: Physician Assistant

## 2024-03-28 DIAGNOSIS — Z89421 Acquired absence of other right toe(s): Secondary | ICD-10-CM

## 2024-04-04 ENCOUNTER — Other Ambulatory Visit: Payer: Self-pay | Admitting: Physician Assistant

## 2024-04-04 DIAGNOSIS — M869 Osteomyelitis, unspecified: Secondary | ICD-10-CM

## 2024-04-04 DIAGNOSIS — I739 Peripheral vascular disease, unspecified: Secondary | ICD-10-CM

## 2024-04-08 NOTE — Progress Notes (Unsigned)
 "     Hector Console, PA-C [address] [place], [place]  [address] Phone: [phone]   Primary Care Physician: Marvine Rush, MD  Primary Gastroenterologist:  Hector Console, PA-C / Elspeth Naval, MD   Chief Complaint: Follow-up dysphagia       HPI:   Discussed the use of AI scribe software for clinical note transcription with the patient, who gave verbal consent to proceed.  84 year old male returns for 87-month follow-up of dysphagia.  He hashistory of GERD, chronic dysphagia for approximately 10 years, globus sensation in the upper throat.  He is currently taking pantoprazole  40 mg every morning and famotidine 40 mg every afternoon.  11/26/2023 barium swallow with tablet: 1. 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 2.  Mild lower esophageal dysmotility. 3.  Normal esophagus with no masses, stricture, or hiatal hernia.  PMH: History of DVT, type 2 diabetes, CKD 3, hypertension, right kidney cancer s/p nephrectomy, GERD, and hemorrhoids.  History of Osteomyelitis Right 2nd Toe s/p second toe amputation on 12/26/23. Currently on Xarelto  and 81 mg aspirin  daily.  Plavix ?  History of Present Illness   GI history:  The patient was seen at [facility] in 2022.  He underwent EGD with dilation (for dysphagia) at [facility] in 08/2020 (Dr. Lonni Phillips).  Multiple concentric rings were noted above the GE junction. Stomach and duodenum appeared normal.  No biopsies.  During procedure he had bleeding from a Mallory-Weiss tear which required intubation due to laryngeal spasm.  1 clip placed for hemostasis.  Patient was transferred to [facility] for overnight monitoring.  Hemoglobin 13.7 at discharge.  He had no significant anemia or GI bleeding.  Treated with Protonix  40 mg twice daily.     01/2017 last colonoscopy by Dr. Aneita: 4 small (5 mm to 6 mm) polyps removed.  Small internal hemorrhoids.  Good prep.  Pathology  showed 2 tubular adenomas, 1 hyperplastic, and 1 colon mucosal polyp.  No further colonoscopies are recommended due to advanced age.   Current Outpatient Medications  Medication Sig Dispense Refill   amLODipine  (NORVASC ) 10 MG tablet Take 1 tablet (10 mg total) by mouth every morning. 90 tablet 0   atorvastatin  (LIPITOR ) 80 MG tablet Take 1 tablet (80 mg total) by mouth daily. 90 tablet 0   Blood Glucose Monitoring Suppl (ACCU-CHEK GUIDE ME) w/Device KIT 1 Piece by Does not apply route as directed. 1 kit 0   clopidogrel  (PLAVIX ) 75 MG tablet Take 1 tablet by mouth daily. 30 tablet 1   colchicine 0.6 MG tablet Take 0.6 mg by mouth.     Continuous Blood Gluc Receiver (FREESTYLE LIBRE 2 READER) DEVI As directed 1 each 0   Continuous Glucose Sensor (FREESTYLE LIBRE 2 PLUS SENSOR) MISC APPLY NEW SENSOR EVERY 14 (FOURTEEN) DAYS. 2 each 2   cyanocobalamin 1000 MCG tablet Take 1,000 mcg by mouth daily.     diphenhydrAMINE  (BENADRYL ) 25 MG tablet Take 25 mg by mouth daily as needed for allergies.     donepezil  (ARICEPT ) 5 MG tablet Take 5 mg by mouth at bedtime. (Patient not taking: Reported on 01/15/2024)     doxycycline  (VIBRA -TABS) 100 MG tablet Take 1 tablet (100 mg total) by mouth 2 (two) times daily. 60 tablet 1   famotidine (PEPCID) 40 MG tablet Take 1 tablet (40 mg total) by mouth daily. 30 tablet 1   fluticasone  (FLONASE ) 50 MCG/ACT nasal spray Place 2 sprays daily as needed  into both nostrils for allergies or rhinitis.     gabapentin  (NEURONTIN ) 600 MG tablet Take 600 mg by mouth 3 (three) times daily.     Insulin  NPH Isophane & Regular (NOVOLIN  70/30 FLEXPEN RELION [employer]) Inject 5 Units into the skin 2 (two) times daily with a meal.     JARDIANCE  10 MG TABS tablet TAKE 1 TABLET BY MOUTH EVERY DAY 90 tablet 1   metoprolol  tartrate (LOPRESSOR ) 25 MG tablet Take 25 mg by mouth 2 (two) times daily.     oxyCODONE  (ROXICODONE ) 5 MG immediate release tablet Take 1 tablet (5 mg total) by mouth every 4  (four) hours as needed for severe pain (pain score 7-10). 30 tablet 0   pantoprazole  (PROTONIX ) 40 MG tablet Take 1 tablet (40 mg total) by mouth daily. 30 tablet 2   predniSONE (DELTASONE) 10 MG tablet Take by mouth.     XARELTO  20 MG TABS tablet Take 20 mg by mouth daily.     No current facility-administered medications for this visit.    Allergies as of 04/09/2024 - Review Complete 02/25/2024  Allergen Reaction Noted   Ace inhibitors Swelling 03/18/2013   Allopurinol  Itching 05/11/2020   Metformin  and related Itching 06/30/2013    Past Medical History:  Diagnosis Date   Asthma 04/11/2011   AS CHILD ONLY   BPH (benign prostatic hypertrophy) with urinary obstruction    Carpal tunnel syndrome    patient denies at 05/14/17 preop visit    Difficult intubation 04/11/2011   08-11-97 some issues with intubation/record with chart   DJD (degenerative joint disease)    DM (diabetes mellitus) (HCC) 04/11/2011   tyep II    DVT femoral (deep venous thrombosis) with thrombophlebitis (HCC) 04/11/2011   ?'09-tx. on Xarelton x 3-4 years    GERD (gastroesophageal reflux disease) 04/11/2011   mild, no med in 1 month   Gout 04/11/2011    ankle 2 yrs ago   Hemorrhoids    Hypercholesteremia    Hypertension    Internal hemorrhoids    Keloid 04/11/2011   multiple-arms,back, chest   Pulmonary nodule 04/11/2011   PT. NOT AWARE -denies problems with breathing   Renal calculus    Thyroid  disease    hypothyroid    Past Surgical History:  Procedure Laterality Date   ABDOMINAL AORTOGRAM W/LOWER EXTREMITY N/A 12/05/2023   Procedure: ABDOMINAL AORTOGRAM W/LOWER EXTREMITY;  Surgeon: Lanis Fonda BRAVO, MD;  Location: [facility] INVASIVE CV LAB;  Service: Cardiovascular;  Laterality: N/A;   AMPUTATION TOE Right 12/26/2023   Procedure: AMPUTATION, TOE;  Surgeon: Harden Jerona GAILS, MD;  Location: [facility] OR;  Service: Orthopedics;  Laterality: Right;  RIGHT 2ND TOE AMPUTATION   CATARACT EXTRACTION W/PHACO Left  10/28/2021   Procedure: CATARACT EXTRACTION PHACO AND INTRAOCULAR LENS PLACEMENT (IOC);  Surgeon: Harrie Agent, MD;  Location: [facility] ORS;  Service: Ophthalmology;  Laterality: Left;  CDE: 6.61   cataract surgery Right 12/11/2013   COLONOSCOPY WITH PROPOFOL  N/A 02/05/2017   Procedure: COLONOSCOPY WITH PROPOFOL ;  Surgeon: Aneita Gwendlyn DASEN, MD;  Location: [facility];  Service: Endoscopy;  Laterality: N/A;   COSMETIC SURGERY  03/13/1998   Keloid injections   CYSTOSCOPY WITH BIOPSY  04/14/2011   Procedure: CYSTOSCOPY WITH BIOPSY;  Surgeon: Noretta Ferrara, MD;  Location: [facility] OR[facility];  Service: Urology;  Laterality: N/A;  Bladder Biopsies    CYSTOSCOPY/RETROGRADE/URETEROSCOPY  04/14/2011   Procedure: CYSTOSCOPY/RETROGRADE/URETEROSCOPY;  Surgeon: Noretta Ferrara, MD;  Location: [facility] OR[facility];  Service: Urology;  Laterality: Bilateral;  (Bil)  RPG    LAPAROSCOPIC NEPHRECTOMY Right 05/21/2017   Procedure: LAPAROSCOPIC RADICAL NEPHRECTOMY;  Surgeon: Renda Glance, MD;  Location: [facility];  Service: Urology;  Laterality: Right;   LOWER EXTREMITY INTERVENTION  12/05/2023   Procedure: LOWER EXTREMITY INTERVENTION;  Surgeon: Lanis Fonda BRAVO, MD;  Location: [facility] INVASIVE CV LAB;  Service: Cardiovascular;;   ortho surgery on fingers  2005 & 2008   Dr. Jearld finger release   PROSTATECTOMY  03/13/1997   Dr. Nicholaus    Review of Systems:    All systems reviewed and negative except where noted in HPI.    Physical Exam:  There were no vitals taken for this visit. No LMP for male patient.  General: Well-nourished, well-developed in no acute distress.  Lungs: Clear to auscultation bilaterally. Non-labored. Heart: Regular rate and rhythm, no murmurs rubs or gallops.  Abdomen: Bowel sounds are normal; Abdomen is Soft; No hepatosplenomegaly, masses or hernias;  No Abdominal Tenderness; No guarding or rebound tenderness. Neuro: Alert and oriented x 3.  Grossly intact.  Psych: Alert and cooperative, normal mood and  affect.   Imaging Studies: No results found.  Labs: CBC    Component Value Date/Time   WBC 9.8 12/24/2023 0917   RBC 5.08 12/24/2023 0917   HGB 15.3 12/24/2023 0917   HCT 46.5 12/24/2023 0917   PLT 184 12/24/2023 0917   MCV 91.5 12/24/2023 0917   MCH 30.1 12/24/2023 0917   MCHC 32.9 12/24/2023 0917   RDW 12.9 12/24/2023 0917   LYMPHSABS 1.2 12/01/2023 2225   MONOABS 0.6 12/01/2023 2225   EOSABS 176 12/24/2023 0917   BASOSABS 39 12/24/2023 0917    CMP     Component Value Date/Time   NA 141 12/24/2023 0917   NA 140 12/18/2023 0904   K 3.6 12/24/2023 0917   CL 104 12/24/2023 0917   CO2 29 12/24/2023 0917   GLUCOSE 198 (H) 12/24/2023 0917   BUN 26 (H) 12/24/2023 0917   BUN 30 (H) 12/18/2023 0904   CREATININE 2.15 (H) 12/24/2023 0917   CALCIUM  9.7 12/24/2023 0917   PROT 7.2 12/18/2023 0904   ALBUMIN 4.4 12/18/2023 0904   AST 16 12/18/2023 0904   ALT 14 12/18/2023 0904   ALKPHOS 95 12/18/2023 0904   BILITOT 0.7 12/18/2023 0904   GFRNONAA 32 (L) 12/06/2023 0258   GFRNONAA 33 (L) 02/26/2019 1510   GFRAA 35 (L) 10/29/2019 1502   GFRAA 38 (L) 02/26/2019 1510       Assessment and Plan:   Bill Yohn is a 84 y.o. y/o male ***  Assessment and Plan Assessment & Plan       Hector Console, PA-C  Follow up ***   "

## 2024-04-09 ENCOUNTER — Ambulatory Visit: Admitting: Physician Assistant

## 2024-04-29 ENCOUNTER — Ambulatory Visit: Admitting: "Endocrinology

## 2024-05-06 ENCOUNTER — Ambulatory Visit: Admitting: Physician Assistant
